# Patient Record
Sex: Male | Born: 1975
Health system: Southern US, Community
[De-identification: ages and names within clinical notes are randomized; demographics above are authoritative.]

## PROBLEM LIST (undated history)

## (undated) DIAGNOSIS — N289 Disorder of kidney and ureter, unspecified: Secondary | ICD-10-CM

## (undated) DIAGNOSIS — I10 Essential (primary) hypertension: Secondary | ICD-10-CM

## (undated) DIAGNOSIS — F319 Bipolar disorder, unspecified: Secondary | ICD-10-CM

## (undated) DIAGNOSIS — N62 Hypertrophy of breast: Secondary | ICD-10-CM

## (undated) DIAGNOSIS — E221 Hyperprolactinemia: Secondary | ICD-10-CM

## (undated) HISTORY — DX: Disorder of kidney and ureter, unspecified: N28.9

## (undated) HISTORY — DX: Bipolar disorder, unspecified: F31.9

## (undated) HISTORY — DX: Essential (primary) hypertension: I10

## (undated) HISTORY — PX: DENTAL SURGERY: SHX609

## (undated) HISTORY — DX: Hypertrophy of breast: N62

## (undated) HISTORY — DX: Hyperprolactinemia: E22.1

---

## 1997-05-26 ENCOUNTER — Other Ambulatory Visit: Admission: RE | Admit: 1997-05-26 | Discharge: 1997-05-26 | Payer: Self-pay

## 1997-05-26 ENCOUNTER — Emergency Department (HOSPITAL_COMMUNITY): Admission: EM | Admit: 1997-05-26 | Discharge: 1997-05-26 | Payer: Self-pay | Admitting: Emergency Medicine

## 1997-12-19 ENCOUNTER — Inpatient Hospital Stay (HOSPITAL_COMMUNITY): Admission: EM | Admit: 1997-12-19 | Discharge: 1997-12-23 | Payer: Self-pay | Admitting: Emergency Medicine

## 1998-05-29 ENCOUNTER — Emergency Department (HOSPITAL_COMMUNITY): Admission: EM | Admit: 1998-05-29 | Discharge: 1998-05-29 | Payer: Self-pay | Admitting: Emergency Medicine

## 1998-05-29 ENCOUNTER — Encounter: Payer: Self-pay | Admitting: Emergency Medicine

## 1998-11-03 ENCOUNTER — Emergency Department (HOSPITAL_COMMUNITY): Admission: EM | Admit: 1998-11-03 | Discharge: 1998-11-03 | Payer: Self-pay | Admitting: Emergency Medicine

## 1998-11-04 ENCOUNTER — Emergency Department (HOSPITAL_COMMUNITY): Admission: EM | Admit: 1998-11-04 | Discharge: 1998-11-04 | Payer: Self-pay | Admitting: Emergency Medicine

## 1998-11-04 ENCOUNTER — Inpatient Hospital Stay (HOSPITAL_COMMUNITY): Admission: EM | Admit: 1998-11-04 | Discharge: 1998-11-06 | Payer: Self-pay | Admitting: Psychiatry

## 2000-02-03 ENCOUNTER — Emergency Department (HOSPITAL_COMMUNITY): Admission: EM | Admit: 2000-02-03 | Discharge: 2000-02-03 | Payer: Self-pay | Admitting: Emergency Medicine

## 2000-08-24 ENCOUNTER — Encounter: Payer: Self-pay | Admitting: Emergency Medicine

## 2000-08-24 ENCOUNTER — Emergency Department (HOSPITAL_COMMUNITY): Admission: EM | Admit: 2000-08-24 | Discharge: 2000-08-24 | Payer: Self-pay

## 2000-11-29 ENCOUNTER — Emergency Department (HOSPITAL_COMMUNITY): Admission: EM | Admit: 2000-11-29 | Discharge: 2000-11-29 | Payer: Self-pay | Admitting: Emergency Medicine

## 2000-11-29 ENCOUNTER — Encounter: Payer: Self-pay | Admitting: Emergency Medicine

## 2001-03-03 ENCOUNTER — Emergency Department (HOSPITAL_COMMUNITY): Admission: EM | Admit: 2001-03-03 | Discharge: 2001-03-03 | Payer: Self-pay | Admitting: Emergency Medicine

## 2001-05-30 ENCOUNTER — Emergency Department (HOSPITAL_COMMUNITY): Admission: EM | Admit: 2001-05-30 | Discharge: 2001-05-30 | Payer: Self-pay

## 2001-08-08 ENCOUNTER — Emergency Department (HOSPITAL_COMMUNITY): Admission: EM | Admit: 2001-08-08 | Discharge: 2001-08-08 | Payer: Self-pay | Admitting: Emergency Medicine

## 2002-01-22 ENCOUNTER — Inpatient Hospital Stay (HOSPITAL_COMMUNITY): Admission: EM | Admit: 2002-01-22 | Discharge: 2002-01-25 | Payer: Self-pay | Admitting: Psychiatry

## 2003-03-03 ENCOUNTER — Emergency Department (HOSPITAL_COMMUNITY): Admission: EM | Admit: 2003-03-03 | Discharge: 2003-03-04 | Payer: Self-pay | Admitting: Emergency Medicine

## 2004-09-10 ENCOUNTER — Ambulatory Visit: Payer: Self-pay | Admitting: Internal Medicine

## 2004-09-17 ENCOUNTER — Encounter: Admission: RE | Admit: 2004-09-17 | Discharge: 2004-09-17 | Payer: Self-pay | Admitting: Internal Medicine

## 2004-09-18 ENCOUNTER — Ambulatory Visit: Payer: Self-pay | Admitting: Internal Medicine

## 2004-09-20 ENCOUNTER — Ambulatory Visit: Payer: Self-pay | Admitting: Internal Medicine

## 2004-09-27 ENCOUNTER — Ambulatory Visit (HOSPITAL_COMMUNITY): Admission: RE | Admit: 2004-09-27 | Discharge: 2004-09-27 | Payer: Self-pay | Admitting: Internal Medicine

## 2004-09-27 ENCOUNTER — Ambulatory Visit: Payer: Self-pay | Admitting: Internal Medicine

## 2004-09-27 DIAGNOSIS — E221 Hyperprolactinemia: Secondary | ICD-10-CM

## 2004-09-27 HISTORY — DX: Hyperprolactinemia: E22.1

## 2005-02-20 ENCOUNTER — Ambulatory Visit: Payer: Self-pay | Admitting: Internal Medicine

## 2005-02-21 DIAGNOSIS — N62 Hypertrophy of breast: Secondary | ICD-10-CM

## 2005-02-21 HISTORY — DX: Hypertrophy of breast: N62

## 2005-02-26 ENCOUNTER — Ambulatory Visit: Payer: Self-pay | Admitting: Internal Medicine

## 2005-03-01 ENCOUNTER — Ambulatory Visit (HOSPITAL_COMMUNITY): Admission: RE | Admit: 2005-03-01 | Discharge: 2005-03-01 | Payer: Self-pay | Admitting: Internal Medicine

## 2005-03-07 ENCOUNTER — Ambulatory Visit: Payer: Self-pay | Admitting: Internal Medicine

## 2005-03-19 ENCOUNTER — Ambulatory Visit: Payer: Self-pay | Admitting: Internal Medicine

## 2005-03-21 ENCOUNTER — Ambulatory Visit: Payer: Self-pay | Admitting: Internal Medicine

## 2005-04-02 ENCOUNTER — Ambulatory Visit: Payer: Self-pay | Admitting: Hospitalist

## 2005-04-04 ENCOUNTER — Ambulatory Visit: Payer: Self-pay | Admitting: Internal Medicine

## 2005-08-01 ENCOUNTER — Ambulatory Visit: Payer: Self-pay | Admitting: Internal Medicine

## 2005-10-10 ENCOUNTER — Ambulatory Visit: Payer: Self-pay | Admitting: Internal Medicine

## 2005-11-06 DIAGNOSIS — F319 Bipolar disorder, unspecified: Secondary | ICD-10-CM | POA: Insufficient documentation

## 2005-11-06 DIAGNOSIS — I10 Essential (primary) hypertension: Secondary | ICD-10-CM | POA: Insufficient documentation

## 2005-11-06 DIAGNOSIS — N62 Hypertrophy of breast: Secondary | ICD-10-CM

## 2006-01-30 DIAGNOSIS — E229 Hyperfunction of pituitary gland, unspecified: Secondary | ICD-10-CM

## 2006-02-25 ENCOUNTER — Telehealth: Payer: Self-pay | Admitting: *Deleted

## 2006-03-24 ENCOUNTER — Telehealth: Payer: Self-pay | Admitting: *Deleted

## 2006-03-25 ENCOUNTER — Encounter (INDEPENDENT_AMBULATORY_CARE_PROVIDER_SITE_OTHER): Payer: Self-pay | Admitting: *Deleted

## 2006-05-27 ENCOUNTER — Telehealth (INDEPENDENT_AMBULATORY_CARE_PROVIDER_SITE_OTHER): Payer: Self-pay | Admitting: *Deleted

## 2006-06-24 ENCOUNTER — Telehealth (INDEPENDENT_AMBULATORY_CARE_PROVIDER_SITE_OTHER): Payer: Self-pay | Admitting: *Deleted

## 2006-07-04 ENCOUNTER — Ambulatory Visit (HOSPITAL_COMMUNITY): Admission: RE | Admit: 2006-07-04 | Discharge: 2006-07-04 | Payer: Self-pay | Admitting: Internal Medicine

## 2006-07-04 ENCOUNTER — Encounter (INDEPENDENT_AMBULATORY_CARE_PROVIDER_SITE_OTHER): Payer: Self-pay | Admitting: Internal Medicine

## 2006-07-04 ENCOUNTER — Ambulatory Visit: Payer: Self-pay | Admitting: Internal Medicine

## 2006-07-07 LAB — CONVERTED CEMR LAB
ALT: 30 units/L (ref 0–53)
AST: 42 units/L — ABNORMAL HIGH (ref 0–37)
Alkaline Phosphatase: 70 units/L (ref 39–117)
Basophils Absolute: 0.1 10*3/uL (ref 0.0–0.1)
Basophils Relative: 1 % (ref 0–1)
Bilirubin Urine: NEGATIVE
Chloride: 104 meq/L (ref 96–112)
Creatinine, Ser: 2.09 mg/dL — ABNORMAL HIGH (ref 0.40–1.50)
Creatinine, Urine: 94.9 mg/dL
Eosinophils Absolute: 0.4 10*3/uL (ref 0.0–0.7)
Eosinophils Relative: 5 % (ref 0–5)
Hemoglobin: 16 g/dL (ref 13.0–17.0)
Leukocytes, UA: NEGATIVE
MCHC: 33.8 g/dL (ref 30.0–36.0)
MCV: 89.4 fL (ref 78.0–100.0)
Microalb, Ur: 2.33 mg/dL — ABNORMAL HIGH (ref 0.00–1.89)
Monocytes Absolute: 0.9 10*3/uL — ABNORMAL HIGH (ref 0.2–0.7)
Neutro Abs: 4.4 10*3/uL (ref 1.7–7.7)
RDW: 13.2 % (ref 11.5–14.0)
Specific Gravity, Urine: 1.01 (ref 1.005–1.03)
Total Bilirubin: 0.5 mg/dL (ref 0.3–1.2)
Urine Glucose: NEGATIVE mg/dL
pH: 6.5 (ref 5.0–8.0)

## 2007-01-12 ENCOUNTER — Telehealth (INDEPENDENT_AMBULATORY_CARE_PROVIDER_SITE_OTHER): Payer: Self-pay | Admitting: *Deleted

## 2007-01-14 ENCOUNTER — Telehealth (INDEPENDENT_AMBULATORY_CARE_PROVIDER_SITE_OTHER): Payer: Self-pay | Admitting: *Deleted

## 2007-06-21 ENCOUNTER — Other Ambulatory Visit: Payer: Self-pay

## 2007-06-21 ENCOUNTER — Other Ambulatory Visit: Payer: Self-pay | Admitting: Emergency Medicine

## 2007-06-22 ENCOUNTER — Inpatient Hospital Stay (HOSPITAL_COMMUNITY): Admission: AD | Admit: 2007-06-22 | Discharge: 2007-06-23 | Payer: Self-pay | Admitting: *Deleted

## 2007-06-22 ENCOUNTER — Ambulatory Visit: Payer: Self-pay | Admitting: *Deleted

## 2008-01-26 ENCOUNTER — Telehealth (INDEPENDENT_AMBULATORY_CARE_PROVIDER_SITE_OTHER): Payer: Self-pay | Admitting: *Deleted

## 2008-01-27 ENCOUNTER — Encounter (INDEPENDENT_AMBULATORY_CARE_PROVIDER_SITE_OTHER): Payer: Self-pay | Admitting: Internal Medicine

## 2008-01-27 ENCOUNTER — Ambulatory Visit: Payer: Self-pay | Admitting: Internal Medicine

## 2008-01-27 LAB — CONVERTED CEMR LAB
CO2: 24 meq/L (ref 19–32)
Calcium: 9.7 mg/dL (ref 8.4–10.5)
Chloride: 105 meq/L (ref 96–112)
Creatinine, Ser: 1.79 mg/dL — ABNORMAL HIGH (ref 0.40–1.50)
Glucose, Bld: 95 mg/dL (ref 70–99)
HCT: 45.8 % (ref 39.0–52.0)
MCV: 86.9 fL (ref 78.0–100.0)
RBC: 5.27 M/uL (ref 4.22–5.81)
WBC: 10.4 10*3/uL (ref 4.0–10.5)

## 2008-09-11 ENCOUNTER — Emergency Department (HOSPITAL_COMMUNITY): Admission: EM | Admit: 2008-09-11 | Discharge: 2008-09-11 | Payer: Self-pay | Admitting: Emergency Medicine

## 2009-01-17 ENCOUNTER — Ambulatory Visit: Payer: Self-pay | Admitting: Internal Medicine

## 2009-01-17 ENCOUNTER — Telehealth (INDEPENDENT_AMBULATORY_CARE_PROVIDER_SITE_OTHER): Payer: Self-pay | Admitting: *Deleted

## 2009-01-17 LAB — CONVERTED CEMR LAB
BUN: 8 mg/dL
CO2: 28 meq/L
Calcium: 9.5 mg/dL
Chloride: 102 meq/L
Creatinine, Ser: 1.79 mg/dL — ABNORMAL HIGH
Glucose, Bld: 126 mg/dL — ABNORMAL HIGH
Potassium: 3.3 meq/L — ABNORMAL LOW
Sodium: 142 meq/L

## 2009-01-18 ENCOUNTER — Telehealth: Payer: Self-pay | Admitting: Internal Medicine

## 2009-01-23 ENCOUNTER — Encounter: Payer: Self-pay | Admitting: Internal Medicine

## 2009-01-23 ENCOUNTER — Ambulatory Visit: Payer: Self-pay | Admitting: Internal Medicine

## 2009-01-24 LAB — CONVERTED CEMR LAB
BUN: 8 mg/dL (ref 6–23)
Chloride: 105 meq/L (ref 96–112)
Creatinine, Ser: 1.77 mg/dL — ABNORMAL HIGH (ref 0.40–1.50)
Potassium: 3.7 meq/L (ref 3.5–5.3)

## 2009-02-14 ENCOUNTER — Ambulatory Visit: Payer: Self-pay | Admitting: Internal Medicine

## 2009-02-14 DIAGNOSIS — R17 Unspecified jaundice: Secondary | ICD-10-CM | POA: Insufficient documentation

## 2009-02-14 DIAGNOSIS — R7309 Other abnormal glucose: Secondary | ICD-10-CM

## 2009-02-14 LAB — CONVERTED CEMR LAB: Hgb A1c MFr Bld: 7.2 %

## 2009-02-15 ENCOUNTER — Telehealth: Payer: Self-pay | Admitting: Internal Medicine

## 2009-02-15 ENCOUNTER — Encounter: Payer: Self-pay | Admitting: Internal Medicine

## 2009-02-15 LAB — CONVERTED CEMR LAB
ALT: 32 units/L (ref 0–53)
Albumin: 4.4 g/dL (ref 3.5–5.2)
CO2: 29 meq/L (ref 19–32)
Calcium: 9.2 mg/dL (ref 8.4–10.5)
Chloride: 103 meq/L (ref 96–112)
Glucose, Bld: 97 mg/dL (ref 70–99)
MCV: 89.1 fL (ref >=78.0)
Platelets: 249 10*3/uL (ref 150–400)
RBC: 5.16 M/uL (ref 4.22–5.81)
Sodium: 143 meq/L (ref 135–145)
Total Bilirubin: 0.5 mg/dL (ref 0.3–1.2)
Total Protein: 7.9 g/dL (ref 6.0–8.3)
Valproic Acid Lvl: 53.7 ug/mL (ref >=50.0)
WBC: 10 10*3/uL (ref 4.0–10.5)

## 2009-03-15 ENCOUNTER — Ambulatory Visit: Payer: Self-pay | Admitting: Internal Medicine

## 2009-05-30 ENCOUNTER — Encounter: Payer: Self-pay | Admitting: Internal Medicine

## 2009-06-02 ENCOUNTER — Telehealth: Payer: Self-pay | Admitting: Internal Medicine

## 2009-06-05 ENCOUNTER — Encounter: Payer: Self-pay | Admitting: Internal Medicine

## 2009-06-05 ENCOUNTER — Ambulatory Visit: Payer: Self-pay | Admitting: Internal Medicine

## 2009-06-13 LAB — CONVERTED CEMR LAB
ALT: 29 units/L (ref 0–53)
AST: 48 units/L — ABNORMAL HIGH (ref 0–37)
Alkaline Phosphatase: 65 units/L (ref 39–117)
Basophils Relative: 0 % (ref 0–1)
CO2: 26 meq/L (ref 19–32)
Creatinine, Ser: 1.82 mg/dL — ABNORMAL HIGH (ref 0.40–1.50)
Eosinophils Absolute: 0.2 10*3/uL (ref 0.0–0.7)
Eosinophils Relative: 2 % (ref 0–5)
HCT: 44.7 % (ref 39.0–52.0)
HDL: 33 mg/dL — ABNORMAL LOW (ref >39)
LDL Cholesterol: 132 mg/dL — ABNORMAL HIGH (ref 0–99)
Lymphs Abs: 2.8 10*3/uL (ref 0.7–4.0)
MCHC: 32.9 g/dL (ref 30.0–36.0)
MCV: 89.8 fL (ref >=78.0)
Neutrophils Relative %: 58 % (ref 43–77)
Platelets: 229 10*3/uL (ref 150–400)
Sodium: 142 meq/L (ref 135–145)
Total Bilirubin: 0.6 mg/dL (ref 0.3–1.2)
Total CHOL/HDL Ratio: 6.6
Total Protein: 8 g/dL (ref 6.0–8.3)
Valproic Acid Lvl: 72.3 ug/mL (ref >=50.0)
WBC: 8.4 10*3/uL (ref 4.0–10.5)

## 2009-08-11 ENCOUNTER — Telehealth: Payer: Self-pay | Admitting: Internal Medicine

## 2010-01-22 ENCOUNTER — Telehealth: Payer: Self-pay | Admitting: Internal Medicine

## 2010-02-11 ENCOUNTER — Encounter: Payer: Self-pay | Admitting: Pulmonary Disease

## 2010-02-20 NOTE — Progress Notes (Signed)
Summary: pharmacy/gg  Phone Note From Pharmacy   Caller: [facility] Summary of Call: Received call from [facility] asking for clarification of Rx's written yesterday this pt.  THey have 1 rx for norvasc 5mg  and one for nrovasc 10 mg.  they are asking if pt should recieve both. I reviewed notes from yesterday and see that norvasc was increased to 10 mg.  pharmacy advised only fill 1 rx for norvasc 10 mg. Initial call taken by: Merrie Roof RN,  February 15, 2009 9:41 AM  Follow-up for Phone Call        Noted. Dr. Gilford Rile will make the change in the EMR. Follow-up by: Julaine Fusi  DO,  February 15, 2009 11:26 AM

## 2010-02-20 NOTE — Progress Notes (Signed)
Summary: phone/gg  Phone Note From Other Clinic   Summary of Call: [facility] is requesting the following labs results.  Pt coming in Monday for fasting lab work.  Can you enter if you approve. FBS, Lipids, Valproic Acid Level, LFT, CBC with Diff Initial call taken by: Merrie Roof RN,  Jun 02, 2009 10:03 AM  Follow-up for Phone Call        Okay to order labs per Dr Gilford Rile Follow-up by: Merrie Roof RN,  Jun 02, 2009 2:48 PM  New Problems: ENCOUNTER FOR LONG-TERM USE OF OTHER MEDICATIONS (ICD-V58.69)   New Problems: ENCOUNTER FOR LONG-TERM USE OF OTHER MEDICATIONS (ICD-V58.69)  Process Orders Check Orders Results:     [facility]: ABN not required for this insurance Order queued for requisitioning for [facility]: Jun 02, 2009 3:01 PM  Tests Sent for requisitioning (Jun 02, 2009 3:01 PM):     06/05/2009: [facility] -- T-Comprehensive Metabolic Panel [[id number]] (signed)     06/02/2009: [facility] -- T-CBC w/Diff [[id number]] (signed)     06/02/2009: [facility] -- T-Lipid Profile [id number] (signed)     06/02/2009: [facility] -- T-Valproic Acid (Depakene) [[id number]] (signed)

## 2010-02-20 NOTE — Assessment & Plan Note (Signed)
Summary: ACUTE-HIGH BLOOD PRESSURE/164/108 PER DENTIST(TOBBIA)   Vital Signs:  Patient profile:   35 year old male Height:      71 inches (180.34 cm) Weight:      231.5 pounds (105.23 kg) BMI:     32.40 Temp:     98.9 degrees F (37.17 degrees C) oral Pulse rate:   92 / minute BP sitting:   130 / 90  (left arm) Cuff size:   large  Vitals Entered By: Krystal Eaton Duncan Dull) (March 15, 2009 10:57 AM) CC: f/u htn Is Patient Diabetic? No Pain Assessment Patient in pain? no      Nutritional Status BMI of > 30 = obese  Have you ever been in a relationship where you felt threatened, hurt or afraid?No   Does patient need assistance? Functional Status Self care Ambulation Normal   Primary Care Provider:  [name]  CC:  f/u htn.  History of Present Illness: 35 y/o man with Bipolar disorder and HTN comes to the clinic for a clearance for dental extraction after his BP was high at the dentists office. He does not have any compaints. Last seen a month back when his norvasc dose was increased  Preventive Screening-Counseling & Management  Alcohol-Tobacco     Smoking Status: quit     Year Quit: 2000 or earlier  Current Medications (verified): 1)  Amlodipine Besylate 10 Mg Tabs (Amlodipine Besylate) .... Take 1 Tablet By Mouth Once A Day 2)  Lasix 20 Mg Tabs (Furosemide) .... Take 1 Tablet By Mouth Once Daily. 3)  Lisinopril 10 Mg Tabs (Lisinopril) .... Take 1 Tablet By Mouth Once Daily. 4)  Valproic Acid 250 Mg Caps (Valproic Acid) .... Take 1 Capsule By Mouth Two Times A Day. 5)  Benztropine Mesylate 1 Mg Tabs (Benztropine Mesylate) .... Take 1 Tablet By Mouth Once Daily. 6)  Geodon 40 Mg Caps (Ziprasidone Hcl) .... Take 1 Tablet By Mouth Two Times A Day  Allergies (verified): No Known Drug Allergies  Review of Systems  The patient denies anorexia, fever, weight loss, weight gain, vision loss, decreased hearing, hoarseness, chest pain, syncope, dyspnea on  exertion, peripheral edema, prolonged cough, headaches, hemoptysis, abdominal pain, melena, hematochezia, severe indigestion/heartburn, hematuria, incontinence, genital sores, muscle weakness, suspicious skin lesions, transient blindness, difficulty walking, depression, unusual weight change, abnormal bleeding, enlarged lymph nodes, angioedema, breast masses, and testicular masses.    Physical Exam  General:  alert and overweight-appearing.   Head:  normocephalic and atraumatic.   Lungs:  normal respiratory effort, no accessory muscle use, normal breath sounds, no crackles, and no wheezes.  Heart:  normal rate, regular rhythm, systolic ejection murmur.    Impression & Recommendations:  Problem # 1:  HYPERTENSION (ICD-401.9) 120/90 when i measured it. Continue with current plan. Clear for dental procedure.  His updated medication list for this problem includes:    Amlodipine Besylate 10 Mg Tabs (Amlodipine besylate) .[name]... Take 1 tablet by mouth once a day    Lasix 20 Mg Tabs (Furosemide) .[name]... Take 1 tablet by mouth once daily.    Lisinopril 10 Mg Tabs (Lisinopril) .[name]... Take 1 tablet by mouth once daily.  BP today: 130/90 Prior BP: 155/110 (02/14/2009)  Labs Reviewed: K+: 3.7 (02/15/2009) Creat: : 1.60 (02/15/2009)     Problem # 2:  JAUNDICE (ICD-782.4) CMET was checked at last visit as there was a concern fo jaundice. LFT depect mildly elevated ALT. May be from valproate (1-5%). Otherwise, bilirubin and other values normal. Asymptomatic.  Problem # 3:  HYPERGLYCEMIA (ICD-790.29) HbA1C 7.2. Will need a repeat check to make diagnosis. No complaints of polyurea, polydipisa etc      Problem # 4:  RENAL INSUFFICIENCY (ICD-588.9) Stable at baseline Labs Reviewed: Creat: 1.60 (02/15/2009)  Complete Medication List: 1)  Amlodipine Besylate 10 Mg Tabs (Amlodipine besylate) .... Take 1 tablet by mouth once a day 2)  Lasix 20 Mg Tabs (Furosemide) .... Take 1 tablet by mouth once  daily. 3)  Lisinopril 10 Mg Tabs (Lisinopril) .... Take 1 tablet by mouth once daily. 4)  Valproic Acid 250 Mg Caps (Valproic acid) .... Take 1 capsule by mouth two times a day. 5)  Benztropine Mesylate 1 Mg Tabs (Benztropine mesylate) .... Take 1 tablet by mouth once daily. 6)  Geodon 40 Mg Caps (Ziprasidone hcl) .... Take 1 tablet by mouth two times a day  Patient Instructions: 1)  Please schedule a follow-up appointment in 3 months. 2)  It is important that you exercise regularly at least 20 minutes 5 times a week. If you develop chest pain, have severe difficulty breathing, or feel very tired , stop exercising immediately and seek medical attention. 3)  You need to lose weight. Consider a lower calorie diet and regular exercise.    Prevention & Chronic Care Immunizations   Influenza vaccine: Not documented   Influenza vaccine deferral: Refused  (02/14/2009)    Tetanus booster: Not documented   Td booster deferral: Refused  (02/14/2009)    Pneumococcal vaccine: Not documented  Other Screening   Smoking status: quit  (03/15/2009)  Hypertension   Last Blood Pressure: 130 / 90  (03/15/2009)   Serum creatinine: 1.60  (02/15/2009)   Serum potassium 3.7  (02/15/2009)  Self-Management Support :   Personal Goals (by the next clinic visit) :      Personal blood pressure goal: 130/80  (02/14/2009)   Patient will work on the following items until the next clinic visit to reach self-care goals:     Medications and monitoring: take my medicines every day  (03/15/2009)     Eating: eat more vegetables, eat foods that are low in salt, eat baked foods instead of fried foods  (03/15/2009)     Activity: take a 30 minute walk every day  (03/15/2009)     Other: has not been watching diet or walking but states will start cutting back on salt and fried fast foods and start walking again  (02/14/2009)    Hypertension self-management support: Written self-care plan  (03/15/2009)   Hypertension  self-care plan printed.

## 2010-02-20 NOTE — Letter (Signed)
Summary: LAB /FBS/LIPIDS/ VALPROIC ACID LEVEL/LFT'S/CBC DIFF  LAB /FBS/LIPIDS/ VALPROIC ACID LEVEL/LFT'S/CBC DIFF   Imported By: Margie Billet 06/05/2009 11:07:52  _____________________________________________________________________  External Attachment:    Type:   Image     Comment:   External Document

## 2010-02-20 NOTE — Assessment & Plan Note (Signed)
Summary: RA/NEEDS ROUTINE CHECKUP AND REFILL ON MEDS/CH   Vital Signs:  Patient profile:   35 year old male Height:      71 inches (180.34 cm) Weight:      238.4 pounds (108.36 kg) BMI:     33.37 Temp:     98.8 degrees F (37.11 degrees C) Pulse rate:   102 / minute BP sitting:   155 / 110  (right arm)  Vitals Entered By: Dorie Rank RN (February 14, 2009 2:53 PM) CC: needs med refills, - still going to [facility], Is Patient Diabetic? No Pain Assessment Patient in pain? no      Nutritional Status BMI of > 30 = obese  Does patient need assistance? Functional Status Self care, Cook/clean, Shopping, Social activities Ambulation Normal Comments mom "takes care of me" - pt able to feed and dress self   Serial Vital Signs/Assessments:  Time      Position  BP       Pulse  Resp  Temp     By 2:55 PM   L Arm     157/109                        Dorie Rank RN   Primary Care Provider:  Hollace Hayward MD  CC:  needs med refills, - still going to [facility], and .  History of Present Illness: 35 y/o with PMH HTN and bipolar dsorder followed at mental health by [name] comes to [facility] for FU and medication refills.   no new complaints, denies CP, SOB, N,V,C,D, fever, chills or any other complaints.    Depression History:      The patient denies a depressed mood most of the day and a diminished interest in his usual daily activities.        Comments:  meds from [facility] helping.   Preventive Screening-Counseling & Management  Alcohol-Tobacco     Smoking Status: quit     Year Started:       Year Quit: 2000 or earlier  Current Medications (verified): 1)  Amlodipine Besylate 10 Mg Tabs (Amlodipine Besylate) .... Take 1 Tablet By Mouth Once A Day 2)  Lasix 20 Mg Tabs (Furosemide) .... Take 1 Tablet By Mouth Once Daily. 3)  Lisinopril 10 Mg Tabs (Lisinopril) .... Take 1 Tablet By Mouth Once Daily. 4)  Valproic Acid 250 Mg Caps (Valproic Acid)  .... Take 1 Capsule By Mouth Two Times A Day. 5)  Benztropine Mesylate 1 Mg Tabs (Benztropine Mesylate) .... Take 1 Tablet By Mouth Once Daily. 6)  Geodon 40 Mg Caps (Ziprasidone Hcl) .... Take 1 Tablet By Mouth Two Times A Day  Allergies (verified): No Known Drug Allergies  Review of Systems       Per HPI  Physical Exam  General:  alert, well-developed, and cooperative to examination.    Lungs:  normal respiratory effort, no accessory muscle use, normal breath sounds, no crackles, and no wheezes.  Heart:  normal rate, regular rhythm, 3/6 systolic ejection murmur.  Abdomen:  soft, non-tender, normal bowel sounds, no distention, no guarding, no rebound tenderness, no hepatomegaly, and no splenomegaly.    Msk:  no joint swelling, no joint warmth, and no redness over joints.      Impression & Recommendations:  Problem # 1:  HYPERTENSION (ICD-401.9) Increased Amlodipine to 10mg , and check CMET, will reassess in 1 month.  If bp still not controlled, many things can  be done including  a change of lasix to hctz (works well with ace in AA population) vs increase ace.   His updated medication list for this problem includes:    Amlodipine Besylate 10 Mg Tabs (Amlodipine besylate) .[name]... Take 1 tablet by mouth once a day    Lasix 20 Mg Tabs (Furosemide) .[name]... Take 1 tablet by mouth once daily.    Lisinopril 10 Mg Tabs (Lisinopril) .[name]... Take 1 tablet by mouth once daily.  Problem # 2:  RENAL INSUFFICIENCY (ICD-588.9) Basleline Cr 1.7,  will check CMET for any changes and contine to monitor. 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  Orders: T-Comprehensive Metabolic Panel [id number]) T-CBC No Diff ([id number])  Problem # 3:  HYPERGLYCEMIA (ICD-790.29) Never been check by A1c despite having elevated CBG's will check A1C today, if positive for DM, may need to start an antihyperglycemic agent such as metformin.    Orders: T-Hgb A1C (in-house) ([id number])  Problem # 4:  JAUNDICE (ICD-782.4) eyes appear yellow, this of new onset, will check LFT for any sign of liver damage from his medications.   Problem # 5:  DISORDER, BIPOLAR NOS (ICD-296.80) managed by mental health will check valproic acid level today.   Orders: T-Valproic Acid (Depakene) ([id number])  Complete Medication List: 1)  Amlodipine Besylate 10 Mg Tabs (Amlodipine besylate) .... Take 1 tablet by mouth once a day 2)  Lasix 20 Mg Tabs (Furosemide) .... Take 1 tablet by mouth once daily. 3)  Lisinopril 10 Mg Tabs (Lisinopril) .... Take 1 tablet by mouth once daily. 4)  Valproic Acid 250 Mg Caps (Valproic acid) .... Take 1 capsule by mouth two times a day. 5)  Benztropine Mesylate 1 Mg Tabs (Benztropine mesylate) .... Take 1 tablet by mouth once daily. 6)  Geodon 40 Mg Caps (Ziprasidone hcl) .... Take 1 tablet by mouth two times a day  Patient Instructions: 1)  Please schedule a follow-up appointment in 1 month. Prescriptions: AMLODIPINE BESYLATE 10 MG TABS (AMLODIPINE BESYLATE) Take 1 tablet by mouth once a day  #30 x 5   Entered and Authorized by:   [name]   Signed by:   [name] on 02/14/2009   Method used:   Print then Give to Patient   RxID:   [id number] NORVASC 5 MG TABS (AMLODIPINE BESYLATE) Take 1 tablet by mouth once a day.  #30 x 11   Entered and Authorized by:   [name]   Signed by:   [name] on 02/14/2009   Method used:   Print then Give to Patient   RxID:   [id number] LISINOPRIL 10 MG TABS (LISINOPRIL) Take 1 tablet by mouth once daily.  #30 x 11   Entered and Authorized by:   [name]   Signed by:   [name] on 02/14/2009   Method used:   Print then Give to Patient   RxID:   [id number] LASIX 20 MG TABS (FUROSEMIDE) Take 1 tablet by mouth once daily.  #30 x 11   Entered and Authorized by:   [name]   Signed by:   Darnelle Maffucci  MD on 02/14/2009   Method used:   Print then Give to Patient   RxID:   [id number]  Process Orders Check Orders Results:     [facility]: ABN not required for this insurance Tests Sent for requisitioning (February 14, 2009 5:13 PM):     02/14/2009: [facility] -- T-Comprehensive Metabolic Panel [[id number]] (signed)     02/14/2009: [facility] -- T-CBC No Diff [[id number]] (signed)     02/14/2009: [facility] -- T-Valproic Acid (Depakene) [[id number]] (signed)    Prevention & Chronic Care Immunizations   Influenza vaccine: Not documented   Influenza vaccine deferral: Refused  (02/14/2009)    Tetanus booster: Not documented   Td booster deferral: Refused  (02/14/2009)    Pneumococcal vaccine: Not documented  Other Screening   Smoking status: quit  (02/14/2009)  Hypertension   Last Blood Pressure: 155 / 110  (02/14/2009)   Serum creatinine: 1.77  (01/23/2009)   Serum potassium 3.7  (01/23/2009) CMP ordered     Hypertension flowsheet reviewed?: Yes   Progress toward BP goal: Deteriorated  Self-Management Support :   Personal Goals (by the next clinic visit) :      Personal blood pressure goal: 130/80  (02/14/2009)   Patient will work on the following items until the next clinic visit to reach self-care goals:     Medications and monitoring: take my medicines every day, check my blood pressure, bring all of my medications to every visit  (02/14/2009)     Eating: drink diet soda or water instead of juice or soda, eat foods that are low in salt, eat baked foods instead of fried foods  (02/14/2009)     Activity: join a walking program  (02/14/2009)     Other: has not been watching diet or walking but states will start cutting back on salt and fried fast foods and start walking again  (02/14/2009)    Hypertension self-management support: Referred for medical nutrition therapy  (02/14/2009)   Nursing  Instructions: Refer for medical nutrition therapy (see order)    Diabetes Self Management Training Referral Patient Name: Kyle Montgomery Date Of Birth: February 23, 1975 MRN: [id number] Current Diagnosis:  JAUNDICE (ICD-782.4) HYPERGLYCEMIA (ICD-790.29) HYPERPROLACTINEMIA (ICD-253.1) GYNECOMASTIA (ICD-611.1) DISORDER, BIPOLAR NOS (ICD-296.80) RENAL INSUFFICIENCY (ICD-588.9) HYPERTENSION (ICD-401.9)      Laboratory Results   Blood Tests   Date/Time Received: February 14, 2009 4:42 PM  Date/Time Reported: Burke Keels  February 14, 2009 4:42 PM   HGBA1C: 7.2%   (Normal Range: Non-Diabetic - 3-6%   Control Diabetic - 6-8%)

## 2010-02-20 NOTE — Progress Notes (Signed)
Summary: Refill/gh  Phone Note Refill Request Message from:  Fax from Pharmacy on August 11, 2009 11:08 AM  Refills Requested: Medication #1:  AMLODIPINE BESYLATE 10 MG TABS Take 1 tablet by mouth once a day   Last Refilled: 07/12/2009  Method Requested: Electronic Initial call taken by: Angelina Ok RN,  August 11, 2009 11:08 AM    Prescriptions: AMLODIPINE BESYLATE 10 MG TABS (AMLODIPINE BESYLATE) Take 1 tablet by mouth once a day  #30 x 5   Entered and Authorized by:   [name]   Signed by:   [name] on 08/14/2009   Method used:   Faxed to ...       [facility] (retail)       [address]       [place], [place]  [address]       Ph: [phone]       Fax: [phone]   RxID:   [id number]

## 2010-02-22 NOTE — Progress Notes (Signed)
Summary: Refill/gh  Phone Note Refill Request Message from:  Fax from Pharmacy on January 22, 2010 2:36 PM  Refills Requested: Medication #1:  AMLODIPINE BESYLATE 10 MG TABS Take 1 tablet by mouth once a day   Last Refilled: 12/30/2009  Method Requested: Electronic Initial call taken by: Angelina Ok RN,  January 22, 2010 2:37 PM  Follow-up for Phone Call        Rx faxed to pharmacy Follow-up by: Angelina Ok RN,  January 23, 2010 1:42 PM    Prescriptions: AMLODIPINE BESYLATE 10 MG TABS (AMLODIPINE BESYLATE) Take 1 tablet by mouth once a day  #30 x 5   Entered and Authorized by:   [name]   Signed by:   [name] on 01/22/2010   Method used:   Faxed to ...       [facility] (retail)       [address]       [place], [place]  [address]       Ph: [phone]       Fax: [phone]   RxID:   [id number]

## 2010-03-05 ENCOUNTER — Other Ambulatory Visit: Payer: Self-pay | Admitting: *Deleted

## 2010-03-05 MED ORDER — AMLODIPINE BESYLATE 10 MG PO TABS: 10.0000 mg | ORAL_TABLET | Freq: Every day | ORAL | Status: DC

## 2010-03-05 MED ORDER — FUROSEMIDE 20 MG PO TABS: 20.0000 mg | ORAL_TABLET | Freq: Every day | ORAL | Status: DC

## 2010-03-05 MED ORDER — LISINOPRIL 10 MG PO TABS: 10.0000 mg | ORAL_TABLET | Freq: Every day | ORAL | Status: DC

## 2010-04-16 ENCOUNTER — Encounter: Payer: Self-pay | Admitting: Internal Medicine

## 2010-06-05 NOTE — Discharge Summary (Signed)
NAME:  Kyle Montgomery, Kyle Montgomery NO.:  [id number]   MEDICAL RECORD NO.:  [id number]          PATIENT TYPE:  IPS   LOCATION:  0303                          FACILITY:  [facility]   PHYSICIAN:  Jasmine Pang, M.D. DATE OF BIRTH:  [birth year]   DATE OF ADMISSION:  06/22/2007  DATE OF DISCHARGE:  06/23/2007                               DISCHARGE SUMMARY   IDENTIFICATION:  This is a 35 year old single African American male from  [place], [place].   HISTORY OF PRESENT ILLNESS:  The patient states he was sent to the ED  because of bizarre behaviors.  He stayed in the ED for 4 days because  there was lack of beds here.  He goes to [facility] for psychiatric  treatment.  He is on Geodon, Depakote, and Cogentin, but he had stopped  taking these recently.  He was having auditory hallucinations.  He  states since he has been in the hospital in the emergency room, he has  stabilized since he has been back on his medications.   PAST PSYCHIATRIC HISTORY:  The patient was here in 2004, he was  depressed and suicidal.  He was seen at [facility].  He has a history of bipolar disorder.   FAMILY HISTORY:  Maternal grandfather has psychiatric problems.   ALCOHOL AND DRUG HISTORY:  No current alcohol use or drug use x5 years.  He did use drugs and alcohol before then.   MEDICAL PROBLEMS:  Hypertension and bronchitis.   MEDICATIONS:  1. Geodon 80 mg p.o. b.i.d. with food.  2. Lisinopril 10 mg p.o. daily.  3. Valproic acid 250 mg p.o. q.h.s.  4. Cogentin 1 mg p.o. q.8 h. p.r.n. EPS.   DRUG ALLERGIES:  No known drug allergies.   PHYSICAL FINDINGS:  There were no acute physical or medical problems  noted in the ED.  There were no abnormal diagnostic studies noted.   HOSPITAL COURSE:  The patient was started on Ambien 10 mg p.o. q.h.s.  p.r.n.  He was also restarted on Geodon 80 mg p.o. b.i.d. with food,  lisinopril 10 mg daily, valproic acid 250 mg  p.o. q.h.s. and Cogentin 1  mg p.o. q.8 h. p.r.n. EPS.  The patient's mental status was very stable.  He wanted to go home.  Again, he reiterated that by staying in the  emergency room 4 days on his psychiatric medications again that he was  not hallucinating.  He was not suicidal.  His mood was less depressed,  less anxious.  Affect consistent with mood.  There was no suicidal or  homicidal ideation.  No thoughts of self-injurious behavior.  No  auditory or visual hallucinations.  No paranoia or delusions.  Thoughts  were logical and goal-directed.  Thought content, no predominant theme.  Cognitive was grossly back to baseline.   DISCHARGE DIAGNOSES:  Axis I:  Mood disorder, not otherwise specified.  Axis II:  None.  Axis III:  Hypertension, bronchitis.  Axis IV:  Moderate (problems with primary psychosocial group, other  psychosocial problems, burden of psychiatric illness).  Axis V:  Global assessment of functioning is 50 today.  GAF was 45 upon  admission.  GAF highest past year was 60.   DISCHARGE PLAN:  There was no specific activity level or dietary  restrictions.   POSTHOSPITAL CARE PLANS:  The patient will see Dr. Joni Reining at the  [facility] for followup psychiatric management on an intake basis  and [facility] for community support.  He will call to schedule  this.   Valproic acid level was 36.4 on June 22, 2007.  He will follow up at the  [facility] for review of medical problems and  medications and make an appointment within 1 week to 2 weeks.   DISCHARGE MEDICATIONS:  1. Geodon 80 mg twice a day with food.  2. Valproic acid 500 mg at bedtime.  3. Lisinopril 10 mg daily.      Jasmine Pang, M.D.  Electronically Signed     [facility]/MEDQ  D:  06/23/2007  T:  06/23/2007  Job:  [id number]

## 2010-06-08 NOTE — H&P (Signed)
NAME:  Kyle Montgomery, Kyle Montgomery NO.:  [id number]   MEDICAL RECORD NO.:  [id number]                   PATIENT TYPE:  IPS   LOCATION:  0401                                 FACILITY:  [facility]   PHYSICIAN:  Geoffery Lyons, M.D.                   DATE OF BIRTH:  1975/10/20   DATE OF ADMISSION:  01/22/2002  DATE OF DISCHARGE:  01/25/2002                         PSYCHIATRIC ADMISSION ASSESSMENT   IDENTIFYING INFORMATION:  From the patient and records.  This was a  voluntary admission.   REASON FOR ADMISSION AND SYMPTOMS:  The patient apparently presented to the  emergency department at [facility] indicating that he had skipped  a few appointments at mental health at the [facility] and indicating  that he wanted to live in a long-term facility.  He acknowledged recently  being sad, teary, and not sleeping well.  He says however that he is  compliant with medications, Check my blood, you'll see.   PAST PSYCHIATRIC HISTORY:  He has had prior hospitalized at [facility] and  [facility].   SOCIAL HISTORY:  He went through the 6th grade, he gets disability and  currently lives with his mother.   FAMILY HISTORY:  He believes his maternal grandfather had mental illness,  otherwise it is negative.   ALCOHOL AND DRUG ABUSE:  He denies alcohol or drug use.   PAST MEDICAL HISTORY:  Primary care [facility]:  He states he is seen at the  outpatient clinic at [facility].  Medical problems are none.   CURRENT MEDICATIONS:  Lithium carbonate 300 mg 1 in the morning, 3 at h.s.,  Prolixin a total of 7.5 mg at h.s. and Cogentin 1 mg at h.s.   ALLERGIES:  No known drug allergies.   POSITIVE PHYSICAL FINDINGS:  GENERAL:  A well-nourished, well-developed man  who appeared his stated age.  The only remarkable finding was an unusually  round and distended but firm abdomen with decreased bowel sounds.  The  patient was not clear as to how recently he had  had a bowel movement and did  acknowledge constipation as a problem.   MENTAL STATUS EXAM:  He was anxious.  His affect was restricted.  He was  cooperative and alert and oriented x3.  His sensorium was clear.  His eye  contact was poor.  His speech was normal.  His associations were tight.  His  psychomotor was within normal limits.  His thoughts revealed some paranoia  as well as persecutory thoughts.  He however denied auditory or visual  hallucinations.  He said he has had these in the past, not recently.  He was  not observed to be responding to internal stimuli.  He was not obsessive.  He was neatly dressed and groomed.  He was memory impaired.  Judgment and  insight were fair.  His concentration was intact.  His  intelligence was  below average.   ADMISSION DIAGNOSES:   AXIS I:  Schizophrenia.   AXIS II:  Developmentally disabled.   AXIS III:  None.   AXIS IV:  Housing problem and problem with primary support group.   AXIS V:  Global assessment of function is 30.   PLAN:  To check his lithium level, to reestablish medication compliance, to  discuss his living situation in a family meeting.   ESTIMATED LENGTH OF STAY:  2-3 days.     Mickie Adams- Deery, P.A.-C.              Geoffery Lyons, M.D.    MA/MEDQ  D:  01/31/2002  T:  01/31/2002  Job:  [id number]

## 2010-06-08 NOTE — Discharge Summary (Signed)
NAME:  Kyle Montgomery, Kyle Montgomery NO.:  [id number]   MEDICAL RECORD NO.:  [id number]                   PATIENT TYPE:  IPS   LOCATION:  0401                                 FACILITY:  [facility]   PHYSICIAN:  Geoffery Lyons, M.D.                   DATE OF BIRTH:  1975/05/22   DATE OF ADMISSION:  01/22/2002  DATE OF DISCHARGE:  01/25/2002                                 DISCHARGE SUMMARY   CHIEF COMPLAINT AND PRESENT ILLNESS:  This was the first admission to [facility] for this 35 year old male who presented to the  emergency department at [facility] indicating he had skipped a few  appointments [facility].  He wanted to live in a long-  term facility.  He admitted to being sad, teary eyed, not sleeping well,  although he admitted he was compliant with medications.  He said he was  depressed, suicidal.  He claimed that he was hopeless.  He did not like  living with his mother and would rather live in a place like [facility].  He was not sleeping well.   PAST PSYCHIATRIC HISTORY:  Prior hospitalizations at [facility].   SUBSTANCE ABUSE HISTORY:  He denied the use or abuse of any substances.   PAST MEDICAL HISTORY:  Outpatient clinic at [facility].   MEDICATIONS:  1. Lithium 300 mg in the morning and 900 mg at night.  2. Prolixin 7.5 mg at bedtime.  3. Cogentin 1 mg at bedtime.   PHYSICAL EXAMINATION:  Physical examination was performed, failed to show  any acute findings.   MENTAL STATUS EXAM:  Mental status exam revealed an anxious, alert,  cooperative male, clear sensorium, poor eye contact.  Normal speech.  Thought processes revealed some paranoia. , some ideas of reference, some  persecutory ideation.  He denied any auditory and visual hallucinations.  He  claimed that he had heard voices in the past but not recently.  Cognitive:  Cognition was  preserved.   ADMISSION DIAGNOSES:   AXIS I:  Schizophrenia, chronic, undifferentiated.   AXIS II:  Borderline intellectual functioning.   AXIS III:  No diagnosis.   AXIS IV:  Moderate.   AXIS V:  Global assessment of functioning upon admission 30, highest global  assessment of functioning in the last year 55.   HOSPITAL COURSE:  He was admitted and started in intensive individual and  group psychotherapy.  He was given the Ambien for sleep, lithium carbonate  300 mg in the morning and 900 mg at night, Prolixin 7.5 mg at bedtime,  Cogentin 1 mg at bedtime.  He stated that his mother overreacted to him  missing a couple of appointments.  He said he understood he needed to take  his medication.  He denied any active symptoms, although  he seemed to be  minimizing.  On January 5, he felt he was ready to be discharged, had been  pleasant, cooperative.  He called his mother and his mother came.  She was  willing to pick him up and take him back home.  She felt very comfortable  that he was doing well.  He was denying any suicidal or homicidal ideation.  There was no evidence of active psychosis.  He was on his medications so  went ahead and discharged him to outpatient followup.   DISCHARGE DIAGNOSES:   AXIS I:  Schizophrenia, chronic, undifferentiated.   AXIS II:  Borderline intellectual functioning.   AXIS III:  Arterial hypertension.   AXIS IV:  Moderate.   AXIS V:  Global assessment of functioning upon discharge 50.   DISCHARGE MEDICATIONS:  1. Lithium carbonate 300 mg two in the morning and two at bedtime.  2. Prolixin 5 mg one and a half at bedtime.  3. Cogentin 1 mg at bedtime.   FOLLOW UP:  [facility] and with his primary  physician for his high blood pressure.                                                Geoffery Lyons, M.D.    IL/MEDQ  D:  02/21/2002  T:  02/22/2002  Job:  [id number]

## 2010-07-04 ENCOUNTER — Emergency Department (HOSPITAL_COMMUNITY)
Admission: EM | Admit: 2010-07-04 | Discharge: 2010-07-06 | Disposition: A | Discharge status: Home / Self Care | Payer: Medicaid Other | Attending: Emergency Medicine | Admitting: Emergency Medicine

## 2010-07-04 DIAGNOSIS — R45851 Suicidal ideations: Secondary | ICD-10-CM | POA: Insufficient documentation

## 2010-07-04 DIAGNOSIS — I10 Essential (primary) hypertension: Secondary | ICD-10-CM | POA: Insufficient documentation

## 2010-07-04 DIAGNOSIS — F3130 Bipolar disorder, current episode depressed, mild or moderate severity, unspecified: Secondary | ICD-10-CM | POA: Insufficient documentation

## 2010-07-04 DIAGNOSIS — F29 Unspecified psychosis not due to a substance or known physiological condition: Secondary | ICD-10-CM | POA: Insufficient documentation

## 2010-07-04 LAB — RAPID URINE DRUG SCREEN, HOSP PERFORMED
Amphetamines: NOT DETECTED
Barbiturates: NOT DETECTED
Tetrahydrocannabinol: NOT DETECTED

## 2010-07-04 LAB — DIFFERENTIAL
Basophils Absolute: 0 10*3/uL (ref 0.0–0.1)
Eosinophils Absolute: 0.4 10*3/uL (ref 0.0–0.7)
Eosinophils Relative: 5 % (ref 0–5)
Lymphocytes Relative: 35 % (ref 12–46)
Neutrophils Relative %: 53 % (ref 43–77)

## 2010-07-04 LAB — COMPREHENSIVE METABOLIC PANEL
ALT: 30 U/L (ref 0–53)
AST: 41 U/L — ABNORMAL HIGH (ref 0–37)
Albumin: 3.6 g/dL (ref 3.5–5.2)
Alkaline Phosphatase: 69 U/L (ref 39–117)
Chloride: 104 mEq/L (ref 96–112)
Potassium: 3.6 mEq/L (ref 3.5–5.1)
Total Bilirubin: 0.4 mg/dL (ref 0.3–1.2)

## 2010-07-04 LAB — CBC
Platelets: 227 10*3/uL (ref 150–400)
RBC: 4.82 MIL/uL (ref 4.22–5.81)
RDW: 12.8 % (ref 11.5–15.5)
WBC: 8.6 10*3/uL (ref 4.0–10.5)

## 2010-07-06 DIAGNOSIS — F3112 Bipolar disorder, current episode manic without psychotic features, moderate: Secondary | ICD-10-CM

## 2010-07-14 ENCOUNTER — Emergency Department (HOSPITAL_COMMUNITY)
Admission: EM | Admit: 2010-07-14 | Discharge: 2010-07-16 | Disposition: A | Discharge status: Home / Self Care | Payer: Medicaid Other | Attending: Emergency Medicine | Admitting: Emergency Medicine

## 2010-07-14 DIAGNOSIS — Z79899 Other long term (current) drug therapy: Secondary | ICD-10-CM | POA: Insufficient documentation

## 2010-07-14 DIAGNOSIS — R45851 Suicidal ideations: Secondary | ICD-10-CM | POA: Insufficient documentation

## 2010-07-14 DIAGNOSIS — Z8659 Personal history of other mental and behavioral disorders: Secondary | ICD-10-CM | POA: Insufficient documentation

## 2010-07-14 DIAGNOSIS — F29 Unspecified psychosis not due to a substance or known physiological condition: Secondary | ICD-10-CM | POA: Insufficient documentation

## 2010-07-14 DIAGNOSIS — I10 Essential (primary) hypertension: Secondary | ICD-10-CM | POA: Insufficient documentation

## 2010-07-14 DIAGNOSIS — F3130 Bipolar disorder, current episode depressed, mild or moderate severity, unspecified: Secondary | ICD-10-CM | POA: Insufficient documentation

## 2010-07-14 LAB — DIFFERENTIAL
Eosinophils Relative: 3 % (ref 0–5)
Lymphocytes Relative: 36 % (ref 12–46)
Lymphs Abs: 2.9 10*3/uL (ref 0.7–4.0)
Monocytes Relative: 9 % (ref 3–12)

## 2010-07-14 LAB — RAPID URINE DRUG SCREEN, HOSP PERFORMED
Barbiturates: NOT DETECTED
Cocaine: NOT DETECTED
Opiates: NOT DETECTED

## 2010-07-14 LAB — ETHANOL: Alcohol, Ethyl (B): <11 mg/dL (ref 0–11)

## 2010-07-14 LAB — CBC
HCT: 39.2 % (ref 39.0–52.0)
MCV: 86 fL (ref 78.0–100.0)
RDW: 12.7 % (ref 11.5–15.5)
WBC: 8.1 10*3/uL (ref 4.0–10.5)

## 2010-07-14 LAB — COMPREHENSIVE METABOLIC PANEL
Albumin: 3.4 g/dL — ABNORMAL LOW (ref 3.5–5.2)
BUN: 18 mg/dL (ref 6–23)
CO2: 26 mEq/L (ref 19–32)
Chloride: 102 mEq/L (ref 96–112)
Creatinine, Ser: 1.93 mg/dL — ABNORMAL HIGH (ref 0.50–1.35)
GFR calc non Af Amer: 40 mL/min — ABNORMAL LOW (ref >60)
Total Bilirubin: 0.3 mg/dL (ref 0.3–1.2)

## 2010-09-05 ENCOUNTER — Emergency Department (HOSPITAL_COMMUNITY)
Admission: EM | Admit: 2010-09-05 | Discharge: 2010-09-05 | Disposition: A | Discharge status: Home / Self Care | Payer: Medicaid Other | Attending: Emergency Medicine | Admitting: Emergency Medicine

## 2010-09-05 DIAGNOSIS — R45851 Suicidal ideations: Secondary | ICD-10-CM | POA: Insufficient documentation

## 2010-09-05 DIAGNOSIS — R443 Hallucinations, unspecified: Secondary | ICD-10-CM | POA: Insufficient documentation

## 2010-09-05 DIAGNOSIS — I10 Essential (primary) hypertension: Secondary | ICD-10-CM | POA: Insufficient documentation

## 2010-09-05 DIAGNOSIS — Z8659 Personal history of other mental and behavioral disorders: Secondary | ICD-10-CM | POA: Insufficient documentation

## 2010-09-05 DIAGNOSIS — Z79899 Other long term (current) drug therapy: Secondary | ICD-10-CM | POA: Insufficient documentation

## 2010-09-05 DIAGNOSIS — F3130 Bipolar disorder, current episode depressed, mild or moderate severity, unspecified: Secondary | ICD-10-CM | POA: Insufficient documentation

## 2010-09-05 LAB — COMPREHENSIVE METABOLIC PANEL
ALT: 33 U/L (ref 0–53)
AST: 48 U/L — ABNORMAL HIGH (ref 0–37)
CO2: 27 mEq/L (ref 19–32)
Chloride: 101 mEq/L (ref 96–112)
GFR calc non Af Amer: 41 mL/min — ABNORMAL LOW (ref >60)
Sodium: 140 mEq/L (ref 135–145)
Total Bilirubin: 0.4 mg/dL (ref 0.3–1.2)

## 2010-09-05 LAB — CBC
Platelets: 231 10*3/uL (ref 150–400)
RBC: 4.95 MIL/uL (ref 4.22–5.81)
WBC: 9.7 10*3/uL (ref 4.0–10.5)

## 2010-09-05 LAB — DIFFERENTIAL
Basophils Absolute: 0 10*3/uL (ref 0.0–0.1)
Basophils Relative: 0 % (ref 0–1)
Eosinophils Absolute: 0.2 10*3/uL (ref 0.0–0.7)
Neutrophils Relative %: 57 % (ref 43–77)

## 2010-09-05 LAB — RAPID URINE DRUG SCREEN, HOSP PERFORMED
Amphetamines: NOT DETECTED
Barbiturates: NOT DETECTED
Cocaine: NOT DETECTED
Tetrahydrocannabinol: NOT DETECTED

## 2010-09-17 ENCOUNTER — Encounter: Payer: Self-pay | Admitting: Internal Medicine

## 2010-09-17 ENCOUNTER — Ambulatory Visit (INDEPENDENT_AMBULATORY_CARE_PROVIDER_SITE_OTHER): Payer: Medicaid Other | Admitting: Internal Medicine

## 2010-09-17 VITALS — BP 130/88 | HR 107 | Temp 95.5°F | Wt 229.1 lb

## 2010-09-17 DIAGNOSIS — I10 Essential (primary) hypertension: Secondary | ICD-10-CM

## 2010-09-17 MED ORDER — AMLODIPINE BESYLATE 10 MG PO TABS: 10.0000 mg | ORAL_TABLET | Freq: Every day | ORAL | Status: DC

## 2010-09-17 MED ORDER — FUROSEMIDE 20 MG PO TABS: 20.0000 mg | ORAL_TABLET | Freq: Every day | ORAL | Status: DC

## 2010-09-17 MED ORDER — LISINOPRIL 10 MG PO TABS: 10.0000 mg | ORAL_TABLET | Freq: Every day | ORAL | Status: DC

## 2010-09-17 NOTE — Assessment & Plan Note (Signed)
Blood pressure well controlled on current medication regimen. Will continue the same. Patient refused to have blood work done today but will needed prior to next visit. I have recommended checking blood work in next week he he changes his mind. We will need to followup on creatinine and potassium.

## 2010-09-17 NOTE — Progress Notes (Signed)
  Subjective:    Patient ID: Kyle Montgomery, male    DOB: 04-Feb-1975, 35 y.o.   MRN: [id number]  HPI Patient is 35 year old male with past medical history outlined below who presents to clinic for followup of blood pressure. He reports compliance with medications as well as recommended diet and exercise. He denies recent sicknesses, no chest pains or shortness of breath, no fevers or chills, no abdominal or urinary concerns   Review of Systems Per HPI      Objective:   Physical Exam  Constitutional: Vital signs reviewed.  Patient is a well-developed and well-nourished in no acute distress and cooperative with exam. Alert and oriented x3.  Cardiovascular: RRR, S1 normal, S2 normal, no MRG, pulses symmetric and intact bilaterally Pulmonary/Chest: CTAB, no wheezes, rales, or rhonchi Abdominal: Soft. Non-tender, non-distended, bowel sounds are normal, no masses, organomegaly, or guarding present.  Neurological: A&O x3, Strenght is normal and symmetric bilaterally, cranial nerve II-XII are grossly intact, no focal motor deficit, sensory intact to light touch bilaterally.  .         Assessment & Plan:

## 2010-10-17 LAB — POCT I-STAT, CHEM 8
BUN: 18
Calcium, Ion: 0.98 — ABNORMAL LOW
Creatinine, Ser: 2.1 — ABNORMAL HIGH
Glucose, Bld: 145 — ABNORMAL HIGH
TCO2: 26

## 2010-10-17 LAB — DIFFERENTIAL
Basophils Absolute: 0
Eosinophils Relative: 2
Lymphocytes Relative: 28
Monocytes Absolute: 0.5

## 2010-10-17 LAB — RAPID URINE DRUG SCREEN, HOSP PERFORMED
Cocaine: NOT DETECTED
Opiates: NOT DETECTED

## 2010-10-17 LAB — ETHANOL: Alcohol, Ethyl (B): <5

## 2010-10-17 LAB — CBC
HCT: 41.7
Hemoglobin: 14.2
RDW: 12.8

## 2010-10-18 LAB — HEPATIC FUNCTION PANEL
ALT: 42
Alkaline Phosphatase: 59
Bilirubin, Direct: 0.1
Indirect Bilirubin: 0.5
Total Bilirubin: 0.6

## 2010-12-04 ENCOUNTER — Telehealth: Payer: Self-pay | Admitting: *Deleted

## 2010-12-04 NOTE — Telephone Encounter (Signed)
Pt calls and states he needs "blood level done" an appt is made for Thursday w/ dr garg 11/15 at 1545 per doriss.

## 2010-12-06 ENCOUNTER — Encounter: Payer: Self-pay | Admitting: Internal Medicine

## 2010-12-06 ENCOUNTER — Ambulatory Visit (INDEPENDENT_AMBULATORY_CARE_PROVIDER_SITE_OTHER): Payer: Medicaid Other | Admitting: Internal Medicine

## 2010-12-06 VITALS — BP 137/96 | HR 90 | Temp 97.5°F | Wt 233.2 lb

## 2010-12-06 DIAGNOSIS — R635 Abnormal weight gain: Secondary | ICD-10-CM

## 2010-12-06 DIAGNOSIS — I10 Essential (primary) hypertension: Secondary | ICD-10-CM

## 2010-12-06 DIAGNOSIS — N259 Disorder resulting from impaired renal tubular function, unspecified: Secondary | ICD-10-CM

## 2010-12-06 LAB — LIPID PANEL
Cholesterol: 236 mg/dL — ABNORMAL HIGH (ref 0–200)
HDL: 33 mg/dL — ABNORMAL LOW (ref >39)
LDL Cholesterol: 135 mg/dL — ABNORMAL HIGH (ref 0–99)
Triglycerides: 341 mg/dL — ABNORMAL HIGH (ref <150)
VLDL: 68 mg/dL — ABNORMAL HIGH (ref 0–40)

## 2010-12-06 LAB — TSH: TSH: 1.277 u[IU]/mL (ref 0.350–4.500)

## 2010-12-06 MED ORDER — FUROSEMIDE 20 MG PO TABS: 20.0000 mg | ORAL_TABLET | Freq: Every day | ORAL | Status: DC

## 2010-12-06 MED ORDER — LISINOPRIL 10 MG PO TABS: 10.0000 mg | ORAL_TABLET | Freq: Every day | ORAL | Status: DC

## 2010-12-06 MED ORDER — AMLODIPINE BESYLATE 10 MG PO TABS: 10.0000 mg | ORAL_TABLET | Freq: Every day | ORAL | Status: DC

## 2010-12-06 NOTE — Patient Instructions (Signed)

## 2010-12-06 NOTE — Progress Notes (Signed)
  Subjective:    Patient ID: Kyle Montgomery, male    DOB: [birth year], 35 y.o.   MRN: [id number]  HPI Kyle Montgomery is a 35 year old man who is here today for a regular follow up. Patient is accompanied by her mother.  Patient's blood pressure is only mildly elevated today. He is compliant with his meds. Although his mother says that he has been non compliant with his diet and has not been exercising.   Patient has gained about 30 pounds in last 3 years. Both patient and her mother are worried about the weight gain and wants to lose weight.  He is making adequate amounts of urine and does not have any other complaints today.   Review of Systems  Constitutional: Negative for fever, activity change and appetite change.  HENT: Negative for sore throat.   Respiratory: Negative for cough and shortness of breath.   Cardiovascular: Negative for chest pain and leg swelling.  Gastrointestinal: Negative for nausea, abdominal pain, diarrhea, constipation and abdominal distention.  Genitourinary: Negative for frequency, hematuria and difficulty urinating.  Neurological: Negative for dizziness and headaches.  Psychiatric/Behavioral: Negative for suicidal ideas and behavioral problems.       Objective:   Physical Exam  Constitutional: He is oriented to person, place, and time. He appears well-developed and well-nourished.  HENT:  Head: Normocephalic and atraumatic.  Eyes: Conjunctivae and EOM are normal. Pupils are equal, round, and reactive to light. No scleral icterus.  Neck: Normal range of motion. Neck supple. No JVD present. No thyromegaly present.  Cardiovascular: Normal rate, regular rhythm, normal heart sounds and intact distal pulses.  Exam reveals no gallop and no friction rub.   No murmur heard. Pulmonary/Chest: Effort normal and breath sounds normal. No respiratory distress. He has no wheezes. He has no rales.  Abdominal: Soft. Bowel sounds are normal. He exhibits no distension and no  mass. There is no tenderness. There is no rebound and no guarding.  Musculoskeletal: Normal range of motion. He exhibits no edema and no tenderness.  Lymphadenopathy:    He has no cervical adenopathy.  Neurological: He is alert and oriented to person, place, and time.  Psychiatric: He has a normal mood and affect. His behavior is normal.          Assessment & Plan:

## 2010-12-07 DIAGNOSIS — R635 Abnormal weight gain: Secondary | ICD-10-CM | POA: Insufficient documentation

## 2010-12-07 LAB — COMPLETE METABOLIC PANEL WITH GFR
ALT: 28 U/L (ref 0–53)
AST: 49 U/L — ABNORMAL HIGH (ref 0–37)
Alkaline Phosphatase: 74 U/L (ref 39–117)
Creat: 1.8 mg/dL — ABNORMAL HIGH (ref 0.50–1.35)
Total Bilirubin: 0.5 mg/dL (ref 0.3–1.2)
Total Protein: 8.2 g/dL (ref 6.0–8.3)

## 2010-12-07 NOTE — Assessment & Plan Note (Signed)
I will obtain a TSH today. Patient is hyperglycemic in the past and I will check metabolic profile to assess random blood glucose. Given his weight gain he is at risk of developing diabetes. He was advised to start exercising and cut down on his weight.

## 2010-12-07 NOTE — Assessment & Plan Note (Signed)
His baseline and will check creatinine today. Blood pressure control will be the key along with continuing the lisinopril.

## 2010-12-07 NOTE — Assessment & Plan Note (Signed)
Will check creatinine and potassium today. Patient's blood pressure is well controlled. I have also asked him to follow a low-sodium diet and gave instructions on the discharge paper regarding 1.5 g low-sodium diet.

## 2010-12-24 ENCOUNTER — Ambulatory Visit (INDEPENDENT_AMBULATORY_CARE_PROVIDER_SITE_OTHER): Payer: Medicaid Other | Admitting: Internal Medicine

## 2010-12-24 ENCOUNTER — Encounter: Payer: Self-pay | Admitting: Internal Medicine

## 2010-12-24 VITALS — BP 123/84 | HR 105 | Temp 97.3°F | Ht 70.0 in | Wt 229.3 lb

## 2010-12-24 DIAGNOSIS — N259 Disorder resulting from impaired renal tubular function, unspecified: Secondary | ICD-10-CM

## 2010-12-24 DIAGNOSIS — I10 Essential (primary) hypertension: Secondary | ICD-10-CM

## 2010-12-24 DIAGNOSIS — N189 Chronic kidney disease, unspecified: Secondary | ICD-10-CM

## 2010-12-24 MED ORDER — LISINOPRIL 10 MG PO TABS: 10.0000 mg | ORAL_TABLET | Freq: Every day | ORAL | Status: DC

## 2010-12-24 MED ORDER — AMLODIPINE BESYLATE 10 MG PO TABS: 10.0000 mg | ORAL_TABLET | Freq: Every day | ORAL | Status: DC

## 2010-12-24 NOTE — Progress Notes (Addendum)
  Subjective:    Patient ID: Kyle Montgomery, male    DOB: 26-Mar-1975, 35 y.o.   MRN: [id number]  HPI Patient is a 35 year old male with a past medical history listed below, presents to the outpatient clinic for two-week followup after he was restarted on his blood pressure medication, patient reports that he has been taking his medication and denies any complaints with them. Otherwise states feeling well has no other complaints today. Note the patient is up-to-date on his health maintenance  Patient Active Problem List  Diagnoses  . HYPERPROLACTINEMIA  . DISORDER, BIPOLAR NOS  . HYPERTENSION  . RENAL INSUFFICIENCY  . GYNECOMASTIA  . Weight gain   Current Outpatient Prescriptions on File Prior to Visit  Medication Sig Dispense Refill  . benztropine (COGENTIN) 1 MG tablet Take 1 mg by mouth daily.        [name] valproic acid (DEPAKENE) 250 MG capsule Take 250 mg by mouth 2 (two) times daily.        . ziprasidone (GEODON) 40 MG capsule Take 40 mg by mouth 2 (two) times daily with a meal.         No Known Allergies   Review of Systems  All other systems reviewed and are negative.       Objective:   Physical Exam  Constitutional: He is oriented to person, place, and time. He appears well-developed and well-nourished.  HENT:  Head: Normocephalic and atraumatic.  Eyes: Pupils are equal, round, and reactive to light.  Neck: Normal range of motion. No JVD present. No thyromegaly present.  Cardiovascular: Normal rate, regular rhythm and normal heart sounds.   Pulmonary/Chest: Effort normal and breath sounds normal. He has no wheezes. He has no rales.  Abdominal: Soft. Bowel sounds are normal. There is no tenderness. There is no rebound.  Musculoskeletal: Normal range of motion. He exhibits no edema.  Neurological: He is alert and oriented to person, place, and time.  Skin: Skin is warm and dry.          Assessment & Plan:

## 2010-12-24 NOTE — Assessment & Plan Note (Signed)
Patient's blood pressure today is within goal, continue current medications, recheck blood pressure at next followup

## 2010-12-24 NOTE — Patient Instructions (Signed)
Stop taking lasix.   

## 2010-12-24 NOTE — Assessment & Plan Note (Signed)
Patient is at his baseline creatinine 1.8, unknown cause of his renal disease. Will refer him to nephrology to establish care. No labs needed today

## 2011-02-10 ENCOUNTER — Emergency Department (HOSPITAL_COMMUNITY)
Admission: EM | Admit: 2011-02-10 | Discharge: 2011-02-10 | Disposition: A | Discharge status: Home / Self Care | Payer: Medicaid Other | Attending: Emergency Medicine | Admitting: Emergency Medicine

## 2011-02-10 ENCOUNTER — Emergency Department (HOSPITAL_COMMUNITY): Payer: Medicaid Other

## 2011-02-10 ENCOUNTER — Encounter (HOSPITAL_COMMUNITY): Payer: Self-pay | Admitting: *Deleted

## 2011-02-10 DIAGNOSIS — I10 Essential (primary) hypertension: Secondary | ICD-10-CM | POA: Insufficient documentation

## 2011-02-10 DIAGNOSIS — R443 Hallucinations, unspecified: Secondary | ICD-10-CM | POA: Insufficient documentation

## 2011-02-10 DIAGNOSIS — F411 Generalized anxiety disorder: Secondary | ICD-10-CM | POA: Insufficient documentation

## 2011-02-10 DIAGNOSIS — F319 Bipolar disorder, unspecified: Secondary | ICD-10-CM | POA: Insufficient documentation

## 2011-02-10 DIAGNOSIS — Z79899 Other long term (current) drug therapy: Secondary | ICD-10-CM | POA: Insufficient documentation

## 2011-02-10 LAB — COMPREHENSIVE METABOLIC PANEL
ALT: 25 U/L (ref 0–53)
AST: 35 U/L (ref 0–37)
Calcium: 9.5 mg/dL (ref 8.4–10.5)
Sodium: 145 mEq/L (ref 135–145)
Total Protein: 8.1 g/dL (ref 6.0–8.3)

## 2011-02-10 LAB — CBC
MCH: 30 pg (ref 26.0–34.0)
MCV: 87.8 fL (ref 78.0–100.0)
Platelets: 245 10*3/uL (ref 150–400)
RDW: 12.7 % (ref 11.5–15.5)
WBC: 10.6 10*3/uL — ABNORMAL HIGH (ref 4.0–10.5)

## 2011-02-10 LAB — URINALYSIS, ROUTINE W REFLEX MICROSCOPIC
Bilirubin Urine: NEGATIVE
Hgb urine dipstick: NEGATIVE
Nitrite: NEGATIVE
Specific Gravity, Urine: 1.008 (ref 1.005–1.030)
pH: 6 (ref 5.0–8.0)

## 2011-02-10 LAB — DIFFERENTIAL
Basophils Absolute: 0 10*3/uL (ref 0.0–0.1)
Eosinophils Absolute: 0.3 10*3/uL (ref 0.0–0.7)
Eosinophils Relative: 3 % (ref 0–5)

## 2011-02-10 LAB — RAPID URINE DRUG SCREEN, HOSP PERFORMED
Barbiturates: NOT DETECTED
Benzodiazepines: NOT DETECTED
Cocaine: NOT DETECTED
Tetrahydrocannabinol: NOT DETECTED

## 2011-02-10 NOTE — ED Notes (Signed)
Reports needing medical clearance for hearing voices and having hallucinations. Denies any SI or HI at this time. Calm and cooperative at triage.

## 2011-02-10 NOTE — BH Assessment (Signed)
Assessment Note   Kyle Montgomery is an 36 y.o. male. Pt states that he came to hospital to get his blood pressure, arch in his foot checked and for an overall exam; pt denied any current or past SI and HI attempts and plans; pt states that he is doing very well and states that he would never want to harm self; pt denied any current or past AVH; counselor explained to pt that he spoke to the doctor about hallucinations after watching a scary movie and pt states that he did not have any hallucinations and that he slept well last night; cm again asked pt what brought him to the hospital and he stated that he wanted an exam to make sure he was going good; pt states that he is on blood pressure medication and states that he has never abused any drugs nor alcohol nor any medications; cm questioned pt again about AVH and pt denied; cm went over a no harm contract with pt and explained that if he felt that he was in danger to himself or others that he could call the police or report back to the hospital; pt agreed; spoke with Dr. Manus Gunning and he stated that pt could be discharged  Axis I: none presenting Axis II: Deferred Axis III:  Past Medical History  Diagnosis Date  . Hypertension   . Renal insufficiency     baseline Cr 2.2 - renal US with increased Echo signal, no nephrology eval, no bx  . Gynecomastia 02/07    normal pituitary by MRI  . Hyperprolactinemia 09/27/04    felt 2/2 lithium and fluphenazine  . Bipolar affective     followed by mental health   Axis IV: none noted or described Axis V: 72 or above  Past Medical History:  Past Medical History  Diagnosis Date  . Hypertension   . Renal insufficiency     baseline Cr 2.2 - renal US with increased Echo signal, no nephrology eval, no bx  . Gynecomastia 02/07    normal pituitary by MRI  . Hyperprolactinemia 09/27/04    felt 2/2 lithium and fluphenazine  . Bipolar affective     followed by mental health    History reviewed. No pertinent  past surgical history.  Family History:  Family History  Problem Relation Age of Onset  . Hyperlipidemia Mother   . Hypertension Mother   . Atrial fibrillation Mother   . Hypertension Brother   . Hypertension Maternal Grandmother   . Cancer Maternal Grandmother     Social History:  reports that he has quit smoking. His smoking use included Cigarettes. He does not have any smokeless tobacco history on file. He reports that he does not drink alcohol or use illicit drugs.  Additional Social History:  Alcohol / Drug Use Pain Medications: none stated Prescriptions: none stated Over the Counter: none stated History of alcohol / drug use?: No history of alcohol / drug abuse Allergies: No Known Allergies  Home Medications:  No current facility-administered medications on file as of 02/10/2011.   Medications Prior to Admission  Medication Sig Dispense Refill  . amLODipine (NORVASC) 10 MG tablet Take 1 tablet (10 mg total) by mouth daily.  30 tablet  11  . benztropine (COGENTIN) 1 MG tablet Take 1 mg by mouth daily.        [name] lisinopril (PRINIVIL,ZESTRIL) 10 MG tablet Take 1 tablet (10 mg total) by mouth daily.  30 tablet  11  . ziprasidone (GEODON) 40  MG capsule Take 40 mg by mouth 2 (two) times daily.         OB/GYN Status:  No LMP for male patient.  General Assessment Data Location of Assessment: [facility] ED ACT Assessment: Yes Living Arrangements: Family members Can pt return to current living arrangement?: Yes Admission Status: Voluntary Is patient capable of signing voluntary admission?: Yes Transfer from: Home Referral Source: Self/Family/Friend  Education Status Is patient currently in school?: No  Risk to self Suicidal Ideation: No Suicidal Intent: No Is patient at risk for suicide?: No Suicidal Plan?: No Access to Means: No What has been your use of drugs/alcohol within the last 12 months?: none stated Previous Attempts/Gestures: No How many times?: 0  Other Self Harm  Risks: none Intentional Self Injurious Behavior: None Family Suicide History: No Recent stressful life event(s): Other (Comment) (none stated) Persecutory voices/beliefs?: No Depression: No Substance abuse history and/or treatment for substance abuse?: No Suicide prevention information given to non-admitted patients: Yes  Risk to Others Homicidal Ideation: No Thoughts of Harm to Others: No Current Homicidal Intent: No Current Homicidal Plan: No Access to Homicidal Means: No Identified Victim: none  History of harm to others?: No Assessment of Violence: None Noted Violent Behavior Description: cooperative during assessment  Does patient have access to weapons?: No Criminal Charges Pending?: No Does patient have a court date: No  Psychosis Hallucinations: None noted Delusions: None noted  Mental Status Report Appear/Hygiene: Other (Comment) (casual) Eye Contact: Good Motor Activity: Freedom of movement Speech: Logical/coherent Level of Consciousness: Alert Mood: Other (Comment) (pleasant) Affect: Appropriate to circumstance Anxiety Level: None Thought Processes: Coherent Judgement: Unimpaired Orientation: Person;Place;Time;Situation;Appropriate for developmental age Obsessive Compulsive Thoughts/Behaviors: None  Cognitive Functioning Concentration: Normal Memory: Recent Intact;Remote Intact IQ: Average Insight: Good Impulse Control: Good Appetite: Good Weight Loss: 0  Weight Gain: 0  Sleep: No Change Total Hours of Sleep: 8  Vegetative Symptoms: None  Prior Inpatient Therapy Prior Inpatient Therapy: No  Prior Outpatient Therapy Prior Outpatient Therapy: No          Abuse/Neglect Assessment (Assessment to be complete while patient is alone) Physical Abuse: Denies Verbal Abuse: Denies Sexual Abuse: Denies Exploitation of patient/patient's resources: Denies Self-Neglect: Denies Values / Beliefs Cultural Requests During Hospitalization:  None Spiritual Requests During Hospitalization: None Consults Spiritual Care Consult Needed: No Social Work Consult Needed: No      Additional Information 1:1 In Past 12 Months?: No CIRT Risk: No Elopement Risk: No Does patient have medical clearance?: Yes     Disposition: pt will be discharged home; spoke with Dr. Manus Gunning about pt denying SI, HI, and AVH; pt signed no harm contract Disposition Disposition of Patient: Other dispositions (pt denied SI, HI and AVH) Other disposition(s): Information only (signed no harm contract)  On Site Evaluation by:   Reviewed with Physician:     Earmon Phoenix 02/10/2011 2:54 PM

## 2011-02-10 NOTE — ED Notes (Signed)
Discharge instructions reviewed; verbalizes understanding.  No questions asked; no further c/o's voiced.  Ambulatory to lobby. 

## 2011-02-10 NOTE — ED Provider Notes (Addendum)
History     CSN: [id number]  Arrival date & time 02/10/11  1132   First MD Initiated Contact with Patient 02/10/11 1214      Chief Complaint  Patient presents with  . Medical Clearance    (Consider location/radiation/quality/duration/timing/severity/associated sxs/prior treatment) HPI Comments: Patient presenting for "general medical exam". He states his mother is worried about him because he will have images. He sees images from the movies after he watches them.. When he is in his room in the dark he has had been seeing images from the movie and hearing voices.  Denies any suicidal ideation, homicidal ideation, drug or alcohol use. He denies any physical complaints.  States he's been eating well and urinating well and taking his medications as instructed.  The history is provided by the patient.    Past Medical History  Diagnosis Date  . Hypertension   . Renal insufficiency     baseline Cr 2.2 - renal US with increased Echo signal, no nephrology eval, no bx  . Gynecomastia 02/07    normal pituitary by MRI  . Hyperprolactinemia 09/27/04    felt 2/2 lithium and fluphenazine  . Bipolar affective     followed by mental health    History reviewed. No pertinent past surgical history.  Family History  Problem Relation Age of Onset  . Hyperlipidemia Mother   . Hypertension Mother   . Atrial fibrillation Mother   . Hypertension Brother   . Hypertension Maternal Grandmother   . Cancer Maternal Grandmother     History  Substance Use Topics  . Smoking status: Former Smoker    Types: Cigarettes  . Smokeless tobacco: Not on file  . Alcohol Use: No      Review of Systems  Constitutional: Negative for fever, activity change and appetite change.  HENT: Negative for congestion and rhinorrhea.   Respiratory: Negative for cough and shortness of breath.   Cardiovascular: Negative for chest pain.  Gastrointestinal: Negative for nausea, vomiting and abdominal pain.    Genitourinary: Negative for dysuria and hematuria.  Musculoskeletal: Negative for back pain.  Skin: Negative for rash.  Neurological: Negative for headaches.    Allergies  Review of patient's allergies indicates no known allergies.  Home Medications   Current Outpatient Rx  Name Route Sig Dispense Refill  . AMLODIPINE BESYLATE 10 MG PO TABS Oral Take 1 tablet (10 mg total) by mouth daily. 30 tablet 11  . BENZTROPINE MESYLATE 1 MG PO TABS Oral Take 1 mg by mouth daily.      . IBUPROFEN 200 MG PO TABS Oral Take 200 mg by mouth every 6 (six) hours as needed. For foot pain    . LISINOPRIL 10 MG PO TABS Oral Take 1 tablet (10 mg total) by mouth daily. 30 tablet 11  . VALPROIC ACID 250 MG/5ML PO SYRP Oral Take 250 mg by mouth 2 (two) times daily.    [name] ZIPRASIDONE HCL 40 MG PO CAPS Oral Take 40 mg by mouth 2 (two) times daily.       BP 134/82  Pulse 115  Temp(Src) 98 F (36.7 C) (Oral)  Resp 18  SpO2 98%  Physical Exam  Constitutional: He is oriented to person, place, and time. He appears well-developed and well-nourished. No distress.  HENT:  Head: Normocephalic and atraumatic.  Mouth/Throat: Oropharynx is clear and moist. No oropharyngeal exudate.  Eyes: Conjunctivae and EOM are normal. Pupils are equal, round, and reactive to light.  Neck: Normal range of motion. Neck  supple.  Cardiovascular: Normal rate, regular rhythm and normal heart sounds.   Pulmonary/Chest: Effort normal and breath sounds normal. No respiratory distress.  Abdominal: Soft. There is no tenderness. There is no rebound and no guarding.  Musculoskeletal: Normal range of motion. He exhibits no edema and no tenderness.  Neurological: He is alert and oriented to person, place, and time. No cranial nerve deficit.  Skin: Skin is warm.  Psychiatric: His mood appears anxious. His speech is tangential. He is slowed. He expresses no homicidal and no suicidal ideation. He expresses no suicidal plans and no homicidal  plans.    ED Course  Procedures (including critical care time)  Labs Reviewed  CBC - Abnormal; Notable for the following:    WBC 10.6 (*)    All other components within normal limits  COMPREHENSIVE METABOLIC PANEL - Abnormal; Notable for the following:    Glucose, Bld 138 (*)    Creatinine, Ser 1.69 (*)    Albumin 3.3 (*)    GFR calc non Af Amer 51 (*)    GFR calc Af Amer 59 (*)    All other components within normal limits  URINALYSIS, ROUTINE W REFLEX MICROSCOPIC - Abnormal; Notable for the following:    Protein, ur 30 (*)    All other components within normal limits  DIFFERENTIAL  URINE RAPID DRUG SCREEN (HOSP PERFORMED)  ETHANOL  VALPROIC ACID LEVEL  URINE MICROSCOPIC-ADD ON   Ct Head Wo Contrast  02/10/2011  *RADIOLOGY REPORT*  Clinical Data: Hallucinations  CT HEAD WITHOUT CONTRAST  Technique:  Contiguous axial images were obtained from the base of the skull through the vertex without contrast.  Comparison: MRI brain dated 03/01/2005  Findings: No evidence of parenchymal hemorrhage or extra-axial fluid collection. No mass lesion, mass effect, or midline shift.  No CT evidence of acute infarction.  Cerebral volume is age appropriate.  No ventriculomegaly.  Mild mucosal thickening in the bilateral ethmoid and sphenoid sinuses.  Mastoid air cells are clear.  No evidence of calvarial fracture.  IMPRESSION: No evidence of acute intracranial abnormality.  Original Report Authenticated By: Charline Bills, M.D.     1. Bipolar disorder       MDM  Patient presents for medical evaluation and has no thoughts of hurting himself or hurting anywhere else. He denies any physical complaint. His mother was concerned because he remembers images from scary movies. He does have some tangential speech but is not manic or psychotic. Discussed with ACT team and they agreed.  Patient is stable for discharge.  Screening labs within normal limits. Creatinine improved from baseline. Valproic  level therapeutic   Glynn Octave, MD 02/10/11 1712  Glynn Octave, MD 02/10/11 [phone]

## 2011-04-10 ENCOUNTER — Encounter: Payer: Medicaid Other | Admitting: Internal Medicine

## 2011-12-24 ENCOUNTER — Encounter: Payer: Self-pay | Admitting: Radiation Oncology

## 2011-12-25 ENCOUNTER — Other Ambulatory Visit: Payer: Self-pay | Admitting: Internal Medicine

## 2011-12-30 ENCOUNTER — Other Ambulatory Visit: Payer: Self-pay | Admitting: Internal Medicine

## 2012-01-07 ENCOUNTER — Encounter: Payer: Medicaid Other | Admitting: Radiation Oncology

## 2012-01-09 ENCOUNTER — Telehealth: Payer: Self-pay | Admitting: *Deleted

## 2012-01-09 ENCOUNTER — Encounter: Payer: Self-pay | Admitting: Internal Medicine

## 2012-01-09 ENCOUNTER — Ambulatory Visit: Payer: Medicaid Other | Admitting: Internal Medicine

## 2012-01-09 ENCOUNTER — Ambulatory Visit (HOSPITAL_COMMUNITY)
Admission: RE | Admit: 2012-01-09 | Discharge: 2012-01-09 | Disposition: A | Discharge status: Home / Self Care | Payer: Medicaid Other | Source: Ambulatory Visit | Attending: Internal Medicine | Admitting: Internal Medicine

## 2012-01-09 ENCOUNTER — Ambulatory Visit (INDEPENDENT_AMBULATORY_CARE_PROVIDER_SITE_OTHER): Payer: Medicaid Other | Admitting: Internal Medicine

## 2012-01-09 VITALS — BP 150/98 | HR 67 | Temp 98.1°F | Ht 70.0 in | Wt 213.2 lb

## 2012-01-09 DIAGNOSIS — F319 Bipolar disorder, unspecified: Secondary | ICD-10-CM

## 2012-01-09 DIAGNOSIS — N289 Disorder of kidney and ureter, unspecified: Secondary | ICD-10-CM

## 2012-01-09 DIAGNOSIS — N189 Chronic kidney disease, unspecified: Secondary | ICD-10-CM | POA: Insufficient documentation

## 2012-01-09 DIAGNOSIS — I10 Essential (primary) hypertension: Secondary | ICD-10-CM

## 2012-01-09 DIAGNOSIS — I129 Hypertensive chronic kidney disease with stage 1 through stage 4 chronic kidney disease, or unspecified chronic kidney disease: Secondary | ICD-10-CM | POA: Insufficient documentation

## 2012-01-09 DIAGNOSIS — Z79899 Other long term (current) drug therapy: Secondary | ICD-10-CM | POA: Insufficient documentation

## 2012-01-09 DIAGNOSIS — N259 Disorder resulting from impaired renal tubular function, unspecified: Secondary | ICD-10-CM

## 2012-01-09 DIAGNOSIS — Z23 Encounter for immunization: Secondary | ICD-10-CM

## 2012-01-09 LAB — COMPREHENSIVE METABOLIC PANEL
Albumin: 4.2 g/dL (ref 3.5–5.2)
CO2: 28 mEq/L (ref 19–32)
Calcium: 9.7 mg/dL (ref 8.4–10.5)
Chloride: 105 mEq/L (ref 96–112)
Glucose, Bld: 91 mg/dL (ref 70–99)
Potassium: 3.7 mEq/L (ref 3.5–5.3)
Sodium: 143 mEq/L (ref 135–145)
Total Bilirubin: 0.4 mg/dL (ref 0.3–1.2)
Total Protein: 7.3 g/dL (ref 6.0–8.3)

## 2012-01-09 MED ORDER — AMLODIPINE BESYLATE 10 MG PO TABS: 10.0000 mg | ORAL_TABLET | Freq: Every day | ORAL | Status: DC

## 2012-01-09 MED ORDER — LISINOPRIL 10 MG PO TABS: 10.0000 mg | ORAL_TABLET | Freq: Every day | ORAL | Status: DC

## 2012-01-09 NOTE — Patient Instructions (Addendum)
General Instructions:  Please follow-up at the clinic in 6 months with your PCP at which time we will reevaluate  - OR, please follow-up in the clinic sooner if needed.  There have been changes in your medications - REFILLS HAVE BEEN SENT OF YOUR LISINOPRIL AND AMLODIPINE    You are getting labs today, if they are abnormal I will give you a call.   If symptoms worsen, or new symptoms arise, please call the clinic or go to the ER.  PLEASE BRING ALL OF YOUR MEDICATIONS  IN A BAG TO YOUR NEXT APPOINTMENT   Treatment Goals:  Goals (1 Years of Data) as of 01/09/2012          As of Today 01/07/12 01/07/12 01/07/12 02/10/11     Blood Pressure    . Blood Pressure < 140/90  150/98 99/64 93/68  117/75 134/82      Progress Toward Treatment Goals:    Self Care Goals & Plans:  Self Care Goal 01/09/2012  Eat healthy foods eat foods that are low in salt  Be physically active take a walk every day

## 2012-01-09 NOTE — Assessment & Plan Note (Signed)
Pertinent Data: EKG (01/09/2012): sinus bradycardia, normal axis, nonspecific ST/T wave changes. QTc 445 msec.  Assessment: We were requested to perform an EKG today, as the patient's Geodon has recently been increased to 80 mg twice daily from prior 40 mg twice daily. QTC is within normal range.  Plan:      Will send EKG to the psychiatrist.

## 2012-01-09 NOTE — Assessment & Plan Note (Signed)
Pertinent Data: BP Readings from Last 3 Encounters:  01/09/12 150/98  01/07/12 99/64  02/10/11 134/82    Basic Metabolic Panel:    Component Value Date/Time   NA 145 02/10/2011 1306   K 3.8 02/10/2011 1306   CL 107 02/10/2011 1306   CO2 26 02/10/2011 1306   BUN 11 02/10/2011 1306   CREATININE 1.69* 02/10/2011 1306   CREATININE 1.80* 12/06/2010 1619   GLUCOSE 138* 02/10/2011 1306   CALCIUM 9.5 02/10/2011 1306    Assessment: Disease Control:   not controlled   Progress toward goals:   deteriorated   Barriers to meeting goals: nonadherence to medications - while he was having poorly controlled bipolar disorder, the patient was not following up regularly and did not have appropriate blood pressure followup to allow for safe refills.     Plan:  Refill his lisinopril and amlodipine today.  Check basic metabolic panel.  Educational resources provided: brochure  Self management tools provided:

## 2012-01-09 NOTE — Telephone Encounter (Signed)
Agree. thanks

## 2012-01-09 NOTE — Progress Notes (Signed)
   [name]   MRN: [id number]  DOB: 1975/11/13  PCP: Elfredia Nevins, MD   Subjective:    HPI: [name] is a 36 y.o. male with a PMHx of bipolar affective disorder, hypertension (intermittently compliant with medications), and chronic kidney disease who presented to clinic today for the following:  1) HTN - Patient does check blood pressure regularly at home - 150s mmHg systolic. Patient states that he is currently supposed to be taking Lisinopril and Amlodipine - has been out for > 1 month because he has not been following up in the clinic. However, it seems that after calling the pharmacy that he has gotten his amlodipine intermittently possibly last month and.  denies headaches, dizziness, lightheadedness, chest pain, shortness of breath.  does request refills today.  2) Renal insufficiency - patient states that his urinating well without dysuria, hematuria, flank pain. Has good urinary output and stream.   3) Bipolar disease - patient was recently increased of his medications by his psychiatrist who requests an EKG today, to evaluate for QT prolongation in the setting of his increased Cogentin dosing.   Review of Systems: Per HPI.   Current Outpatient Medications: Medication Sig  . benztropine (COGENTIN) 1 MG tablet Take 1 mg by mouth at bedtime.   . divalproex (DEPAKOTE ER) 500 MG 24 hr tablet Take 500 mg by mouth daily.  [name] lisinopril (PRINIVIL,ZESTRIL) 10 MG tablet Take 1 tablet (10 mg total) by mouth daily.  . ziprasidone (GEODON) 80 MG capsule Take 80 mg by mouth 2 (two) times daily with a meal.  . amLODipine (NORVASC) 10 MG tablet Take 1 tablet (10 mg total) by mouth daily.    Allergies No Known Allergies   Past Medical History  Diagnosis Date  . Hypertension   . Renal insufficiency     baseline Cr 2.2 - renal US with increased Echo signal, no nephrology eval, no bx  . Gynecomastia 02/07    normal pituitary by MRI  . Hyperprolactinemia 09/27/04   felt 2/2 lithium and fluphenazine  . Bipolar affective     followed by mental health    Surgical history No past surgical history on file.   Objective:    Physical Exam: Filed Vitals:   01/09/12 0916  BP: 150/98  Pulse: 67  Temp: 98.1 F (36.7 C)     General: Vital signs reviewed and noted. Well-developed, well-nourished, in no acute distress; alert, appropriate and cooperative throughout examination.  Head: Normocephalic, atraumatic.  Lungs:  Normal respiratory effort. Clear to auscultation BL without crackles or wheezes.  Heart: RRR. S1 and S2 normal without gallop, rubs. No murmur.  Abdomen:  BS normoactive. Soft, Nondistended, non-tender.  No masses or organomegaly.  Extremities: No pretibial edema.    Assessment/ Plan:   Case and plan of care discussed with attending physician, Dr. Blanch Media.

## 2012-01-09 NOTE — Assessment & Plan Note (Signed)
Pertinent Data:  Ref. Range 12/06/2010 02/10/2011  Creatinine Latest Range: 0.50-1.35 mg/dL 4.09 (H) 8.11 (H)  GFR calc Af Amer Latest Range: >90 mL/min  59 (L)    Assessment: Likely secondary to medical renal disease in the setting of his uncontrolled hypertension. Last renal ultrasound in 2006 did show mild increased echogenicity of bilateral kidneys - suggestive of medical renal disease. However, a left renal cyst was noted.  Plan:      Recheck basic metabolic panel today.   Check microalb/cr.  Check urinalysis with micro.

## 2012-01-09 NOTE — Telephone Encounter (Signed)
Pt presents w/ caretaker, states he did not mean to miss his appt on12/17 but got mixed up, he is cautioned that he must be seen on a regular basis and keep appts, he and caretaker voice understanding. Needs refills on bp meds, scheduled w/ dr Clyde Lundborg at [phone]

## 2012-01-10 ENCOUNTER — Encounter: Payer: Self-pay | Admitting: Internal Medicine

## 2012-01-10 DIAGNOSIS — R809 Proteinuria, unspecified: Secondary | ICD-10-CM | POA: Insufficient documentation

## 2012-01-10 LAB — URINALYSIS, ROUTINE W REFLEX MICROSCOPIC
Bilirubin Urine: NEGATIVE
Hgb urine dipstick: NEGATIVE
Ketones, ur: NEGATIVE mg/dL
Protein, ur: NEGATIVE mg/dL
Urobilinogen, UA: 0.2 mg/dL (ref 0.0–1.0)

## 2012-01-10 LAB — MICROALBUMIN / CREATININE URINE RATIO
Creatinine, Urine: 39.4 mg/dL
Microalb, Ur: 15.23 mg/dL — ABNORMAL HIGH (ref 0.00–1.89)

## 2012-02-23 ENCOUNTER — Encounter: Payer: Self-pay | Admitting: Radiation Oncology

## 2012-03-30 ENCOUNTER — Other Ambulatory Visit: Payer: Self-pay | Admitting: *Deleted

## 2012-03-30 DIAGNOSIS — I10 Essential (primary) hypertension: Secondary | ICD-10-CM

## 2012-03-30 MED ORDER — AMLODIPINE BESYLATE 10 MG PO TABS: 10.0000 mg | ORAL_TABLET | Freq: Every day | ORAL | Status: DC

## 2012-03-30 MED ORDER — LISINOPRIL 10 MG PO TABS: 10.0000 mg | ORAL_TABLET | Freq: Every day | ORAL | Status: DC

## 2012-03-30 NOTE — Telephone Encounter (Signed)
[facility] sent to dr Ivin Poot for refills

## 2012-07-10 ENCOUNTER — Other Ambulatory Visit (HOSPITAL_COMMUNITY): Payer: Self-pay | Admitting: Psychiatry

## 2012-07-11 ENCOUNTER — Other Ambulatory Visit (HOSPITAL_COMMUNITY): Payer: Self-pay | Admitting: Psychiatry

## 2012-07-17 NOTE — Progress Notes (Signed)
This encounter was created in error - please disregard.

## 2012-07-29 ENCOUNTER — Ambulatory Visit (INDEPENDENT_AMBULATORY_CARE_PROVIDER_SITE_OTHER): Payer: Medicaid Other | Admitting: Internal Medicine

## 2012-07-29 ENCOUNTER — Encounter: Payer: Self-pay | Admitting: Internal Medicine

## 2012-07-29 ENCOUNTER — Encounter: Payer: Self-pay | Admitting: *Deleted

## 2012-07-29 VITALS — BP 132/96 | HR 78 | Temp 97.9°F | Ht 72.0 in | Wt 208.8 lb

## 2012-07-29 DIAGNOSIS — N289 Disorder of kidney and ureter, unspecified: Secondary | ICD-10-CM

## 2012-07-29 DIAGNOSIS — I10 Essential (primary) hypertension: Secondary | ICD-10-CM

## 2012-07-29 DIAGNOSIS — N259 Disorder resulting from impaired renal tubular function, unspecified: Secondary | ICD-10-CM

## 2012-07-29 DIAGNOSIS — N189 Chronic kidney disease, unspecified: Secondary | ICD-10-CM

## 2012-07-29 LAB — CBC WITH DIFFERENTIAL/PLATELET
Eosinophils Absolute: 0.2 10*3/uL (ref 0.0–0.7)
Eosinophils Relative: 3 % (ref 0–5)
Hemoglobin: 14.5 g/dL (ref 13.0–17.0)
Lymphocytes Relative: 41 % (ref 12–46)
Lymphs Abs: 2.9 10*3/uL (ref 0.7–4.0)
MCH: 27.8 pg (ref 26.0–34.0)
MCV: 81.2 fL (ref 78.0–100.0)
Monocytes Relative: 7 % (ref 3–12)
RBC: 5.21 MIL/uL (ref 4.22–5.81)
WBC: 7.1 10*3/uL (ref 4.0–10.5)

## 2012-07-29 LAB — LIPID PANEL
Cholesterol: 201 mg/dL — ABNORMAL HIGH (ref 0–200)
HDL: 36 mg/dL — ABNORMAL LOW (ref >39)
Total CHOL/HDL Ratio: 5.6 Ratio
VLDL: 43 mg/dL — ABNORMAL HIGH (ref 0–40)

## 2012-07-29 LAB — COMPREHENSIVE METABOLIC PANEL
Alkaline Phosphatase: 56 U/L (ref 39–117)
BUN: 14 mg/dL (ref 6–23)
CO2: 28 mEq/L (ref 19–32)
Creat: 1.52 mg/dL — ABNORMAL HIGH (ref 0.50–1.35)
Glucose, Bld: 83 mg/dL (ref 70–99)
Sodium: 144 mEq/L (ref 135–145)
Total Bilirubin: 0.4 mg/dL (ref 0.3–1.2)

## 2012-07-29 MED ORDER — AMLODIPINE BESYLATE 10 MG PO TABS: 10.0000 mg | ORAL_TABLET | Freq: Every day | ORAL | Status: DC

## 2012-07-29 MED ORDER — LISINOPRIL 10 MG PO TABS: 10.0000 mg | ORAL_TABLET | Freq: Every day | ORAL | Status: DC

## 2012-07-29 NOTE — Progress Notes (Signed)
Pt presents to front desk wanting refill of BP med and appt, states he guesses that because he has not come for appt since dec 2013 that his bp med is being refused so he wants to be seen for bp and get a refill. The request was sent to his [facility] md and refused by him. Pt is placed in empty 1430 slot for dr Virgina Organ.

## 2012-07-29 NOTE — Progress Notes (Signed)
Agree with plan 

## 2012-07-29 NOTE — Progress Notes (Signed)
Subjective:   Patient ID: Kyle Montgomery male   DOB: [birth year] 37 y.o.   MRN: [id number]  HPI: [name] is a 37 y.o. pleasant African American male with PMH of bipolar disorder and HTN who presents to clinic today as a walk in for refills of his BP medication.  He is present alongside his mother who claims they have missed several of his past appointments due to being busy at home with her husband's illness and appointments and he needs refills on his medications but has not been seen for a while so the refills were not being filled.  He says he has been out of his lisinopril for at least 1 week but still has a few of his norvasc which he took this morning as prescribed.  He denies any headaches, chest pain, sob, vision disturbances, light-headedness, dizziness, syncope, or near syncope.   His mother claims he needs to see his dentist again to get his teeth pulled out as advised due to poor dentition which is also making it hard for him to chew and eat his food.    Past Medical History  Diagnosis Date  . Hypertension   . Renal insufficiency     baseline Cr 2.2 - renal US with increased Echo signal, no nephrology eval, no bx  . Gynecomastia 02/07    normal pituitary by MRI  . Hyperprolactinemia 09/27/04    felt 2/2 lithium and fluphenazine  . Bipolar affective     followed by mental health   Current Outpatient Prescriptions  Medication Sig Dispense Refill  . amLODipine (NORVASC) 10 MG tablet Take 1 tablet (10 mg total) by mouth daily.  30 tablet  5  . divalproex (DEPAKOTE ER) 500 MG 24 hr tablet Take 500 mg by mouth daily.      [name] lisinopril (PRINIVIL,ZESTRIL) 10 MG tablet Take 1 tablet (10 mg total) by mouth daily.  30 tablet  5  . ziprasidone (GEODON) 80 MG capsule Take 80 mg by mouth 2 (two) times daily with a meal.       No current facility-administered medications for this visit.   Family History  Problem Relation Age of Onset  . Hyperlipidemia Mother   . Hypertension  Mother   . Atrial fibrillation Mother   . Hypertension Brother   . Hypertension Maternal Grandmother   . Cancer Maternal Grandmother    History   Social History  . Marital Status: Single    Spouse Name: N/A    Number of Children: N/A  . Years of Education: N/A   Social History Main Topics  . Smoking status: Former Smoker    Types: Cigarettes  . Smokeless tobacco: None  . Alcohol Use: No  . Drug Use: No  . Sexually Active: None   Other Topics Concern  . None   Social History Narrative  . None   Review of Systems:  Constitutional:  Denies fever, chills, diaphoresis, appetite change and fatigue.   HEENT:  Poor dentition and trouble swallowing food at times due to his teeth.  Denies congestion, sore throat, rhinorrhea, sneezing, mouth sores, neck pain   Respiratory:  Denies SOB, DOE, cough, and wheezing.   Cardiovascular:  Denies palpitations and leg swelling.   Gastrointestinal:  Denies nausea, vomiting, abdominal pain, diarrhea, constipation, blood in stool and abdominal distention.   Genitourinary:  Denies dysuria, urgency, frequency, hematuria, flank pain and difficulty urinating.   Musculoskeletal:  Denies myalgias, back pain, joint swelling, arthralgias and gait problem.  Skin:  Denies pallor, rash and wound.   Neurological:  Denies dizziness, seizures, syncope, weakness, light-headedness, numbness and headaches.    Objective:  Physical Exam: Filed Vitals:   07/29/12 1428  BP: 132/96  Pulse: 78  Temp: 97.9 F (36.6 C)  TempSrc: Oral  Height: 6' (1.829 m)  Weight: 208 lb 12.8 oz (94.711 kg)  SpO2: 95%   Vitals reviewed. General: sitting in chair, NAD HEENT: PERRL, EOMI, poor dentition Cardiac: RRR, no rubs, murmurs or gallops Pulm: clear to auscultation bilaterally, no wheezes, rales, or rhonchi Abd: soft, nontender, nondistended, BS present Ext: warm and well perfused, no pedal edema, +2DP B/L Neuro: appears to have mild MR but responds to questions  appropriately and alert and oriented X3, cranial nerves II-XII grossly intact, strength and sensation to light touch equal in bilateral upper and lower extremities  Assessment & Plan:   Discussed with Dr. Dalphine Handing HTN: refilled lisinopril and norvasc but will check labs and lipid panel

## 2012-07-29 NOTE — Assessment & Plan Note (Addendum)
Trend seems to be improving.  Last Cr check December 2013 1.64.  Continue lisinopril for now and work on BP control  -f/u cmet, cbc, lipid panel

## 2012-07-29 NOTE — Assessment & Plan Note (Addendum)
BP Readings from Last 3 Encounters:  07/29/12 132/96  01/09/12 150/98  01/07/12 99/64   Lab Results  Component Value Date   NA 143 01/09/2012   K 3.7 01/09/2012   CREATININE 1.64* 01/09/2012   Assessment: Blood pressure control: mildly elevated Progress toward BP goal:  improved Comments: has been out of his lisinopril x1 week  Plan: Medications:  continue current medications norvasc 10 and lisinopril 10 Educational resources provided: brochure;video Self management tools provided:   Other plans: recheck bmet and monitor renal function and lipid panel; return in 1 month for follow up

## 2012-07-29 NOTE — Patient Instructions (Addendum)
General Instructions: Please restart your medications for blood pressure, amlodipine and norvasc as prescribed, i have called in refills to your pharmacy  Make sure to follow up with your appointments in the future  Thank you for coming in today  Treatment Goals:  Goals (1 Years of Data) as of 07/29/12         As of Today 01/09/12 01/07/12 01/07/12 01/07/12     Blood Pressure    . Blood Pressure < 140/90  132/96 150/98 99/64 93/68  117/75      Progress Toward Treatment Goals:  Treatment Goal 07/29/2012  Blood pressure improved    Self Care Goals & Plans:  Self Care Goal 07/29/2012  Manage my medications take my medicines as prescribed  Eat healthy foods -  Be physically active -     Care Management & Community Referrals: Hypertension Hypertension is another name for high blood pressure. High blood pressure may mean that your heart needs to work harder to pump blood. Blood pressure consists of two numbers, which includes a higher number over a lower number (example: 110/72). HOME CARE   Make lifestyle changes as told by your doctor. This may include weight loss and exercise.  Take your blood pressure medicine every day.  Limit how much salt you use.  Stop smoking if you smoke.  Do not use drugs.  Talk to your doctor if you are using decongestants or birth control pills. These medicines might make blood pressure higher.  Females should not drink more than 1 alcoholic drink per day. Males should not drink more than 2 alcoholic drinks per day.  See your doctor as told. GET HELP RIGHT AWAY IF:   You have a blood pressure reading with a top number of 180 or higher.  You get a very bad headache.  You get blurred or changing vision.  You feel confused.  You feel weak, numb, or faint.  You get chest or belly (abdominal) pain.  You throw up (vomit).  You cannot breathe very well. MAKE SURE YOU:   Understand these instructions.  Will watch your  condition.  Will get help right away if you are not doing well or get worse. Document Released: 06/26/2007 Document Revised: 04/01/2011 Document Reviewed: 06/26/2007 [facility] Patient Information 2014 [place], [place].

## 2012-07-30 NOTE — Progress Notes (Signed)
Case discussed with Dr. Qureshi soon after the resident saw the patient.  We reviewed the resident's history and exam and pertinent patient test results.  I agree with the assessment, diagnosis, and plan of care documented in the resident's note. 

## 2012-08-23 ENCOUNTER — Emergency Department (HOSPITAL_COMMUNITY)
Admission: EM | Admit: 2012-08-23 | Discharge: 2012-08-23 | Disposition: A | Discharge status: Home / Self Care | Payer: Medicaid Other | Attending: Emergency Medicine | Admitting: Emergency Medicine

## 2012-08-23 ENCOUNTER — Encounter (HOSPITAL_COMMUNITY): Payer: Self-pay | Admitting: *Deleted

## 2012-08-23 DIAGNOSIS — R112 Nausea with vomiting, unspecified: Secondary | ICD-10-CM | POA: Insufficient documentation

## 2012-08-23 DIAGNOSIS — I129 Hypertensive chronic kidney disease with stage 1 through stage 4 chronic kidney disease, or unspecified chronic kidney disease: Secondary | ICD-10-CM | POA: Insufficient documentation

## 2012-08-23 DIAGNOSIS — N289 Disorder of kidney and ureter, unspecified: Secondary | ICD-10-CM | POA: Insufficient documentation

## 2012-08-23 DIAGNOSIS — R1110 Vomiting, unspecified: Secondary | ICD-10-CM

## 2012-08-23 DIAGNOSIS — F319 Bipolar disorder, unspecified: Secondary | ICD-10-CM | POA: Insufficient documentation

## 2012-08-23 DIAGNOSIS — Z79899 Other long term (current) drug therapy: Secondary | ICD-10-CM | POA: Insufficient documentation

## 2012-08-23 DIAGNOSIS — R42 Dizziness and giddiness: Secondary | ICD-10-CM

## 2012-08-23 DIAGNOSIS — Z87891 Personal history of nicotine dependence: Secondary | ICD-10-CM | POA: Insufficient documentation

## 2012-08-23 LAB — COMPREHENSIVE METABOLIC PANEL
ALT: 18 U/L (ref 0–53)
AST: 27 U/L (ref 0–37)
Albumin: 3.4 g/dL — ABNORMAL LOW (ref 3.5–5.2)
Alkaline Phosphatase: 60 U/L (ref 39–117)
Calcium: 9.3 mg/dL (ref 8.4–10.5)
Potassium: 3.5 mEq/L (ref 3.5–5.1)
Sodium: 142 mEq/L (ref 135–145)
Total Protein: 7.1 g/dL (ref 6.0–8.3)

## 2012-08-23 LAB — CBC WITH DIFFERENTIAL/PLATELET
Basophils Absolute: 0 10*3/uL (ref 0.0–0.1)
Eosinophils Absolute: 0.2 10*3/uL (ref 0.0–0.7)
Eosinophils Relative: 3 % (ref 0–5)
Lymphs Abs: 2.6 10*3/uL (ref 0.7–4.0)
MCH: 29 pg (ref 26.0–34.0)
MCV: 83.4 fL (ref 78.0–100.0)
Neutrophils Relative %: 50 % (ref 43–77)
Platelets: 202 10*3/uL (ref 150–400)
RBC: 4.76 MIL/uL (ref 4.22–5.81)
RDW: 13.1 % (ref 11.5–15.5)
WBC: 6.8 10*3/uL (ref 4.0–10.5)

## 2012-08-23 MED ORDER — SODIUM CHLORIDE 0.9 % IV BOLUS (SEPSIS)
1000.0000 mL | Freq: Once | INTRAVENOUS | Status: AC
  Administered 2012-08-23: 1000 mL via INTRAVENOUS

## 2012-08-23 NOTE — ED Notes (Signed)
C/o n/v always in the morning & intermittent dizziness & lightheadedness x 3 weeks. Denies diarrhea

## 2012-08-23 NOTE — ED Notes (Signed)
C/o once daily emesis in the morning & intermittent dizziness x 3 weeks. Eating & drinking well, denies diarrhea. Denies cold, cough, abd pain, sore throat, nasal congestion.  Pt reports, "I've been taking my medicine on time every day & I'm not hearing or seeing things anymore". Also c/o "feeling warm all over inside".

## 2012-08-23 NOTE — ED Notes (Signed)
Tolerated food & liquids well. No vomiting noted, denies nausea.

## 2012-08-23 NOTE — ED Provider Notes (Signed)
CSN: [id number]     Arrival date & time 08/23/12  1055 History     First MD Initiated Contact with Patient 08/23/12 1058     Chief Complaint  Patient presents with  . Emesis  . Dizziness   (Consider location/radiation/quality/duration/timing/severity/associated sxs/prior Treatment) HPI Comments: Patient presents to the emergency department with chief complaint of nausea, vomiting, and dizziness x3 weeks. States that he has had persistent nausea and vomiting every morning for the past 3 weeks. He denies seeing any blood in his vomit. He also reports associated dizziness/lightheadedness. He denies any chest pain, shortness of breath, diarrhea, or constipation. He states that he has several psychiatric issues, but that he has been following up appropriately, and has been doing well. He denies SI, HI, or visual/auditory hallucinations. He states that nothing makes his symptoms better or worse.  The history is provided by the patient. No language interpreter was used.    Past Medical History  Diagnosis Date  . Hypertension   . Renal insufficiency     baseline Cr 2.2 - renal US with increased Echo signal, no nephrology eval, no bx  . Gynecomastia 02/07    normal pituitary by MRI  . Hyperprolactinemia 09/27/04    felt 2/2 lithium and fluphenazine  . Bipolar affective     followed by mental health   History reviewed. No pertinent past surgical history. Family History  Problem Relation Age of Onset  . Hyperlipidemia Mother   . Hypertension Mother   . Atrial fibrillation Mother   . Hypertension Brother   . Hypertension Maternal Grandmother   . Cancer Maternal Grandmother    History  Substance Use Topics  . Smoking status: Former Smoker    Types: Cigarettes  . Smokeless tobacco: Not on file  . Alcohol Use: No    Review of Systems  All other systems reviewed and are negative.    Allergies  Review of patient's allergies indicates no known allergies.  Home Medications    Current Outpatient Rx  Name  Route  Sig  Dispense  Refill  . amLODipine (NORVASC) 10 MG tablet   Oral   Take 1 tablet (10 mg total) by mouth daily.   30 tablet   5   . divalproex (DEPAKOTE ER) 500 MG 24 hr tablet   Oral   Take 500 mg by mouth daily.         [name] lisinopril (PRINIVIL,ZESTRIL) 10 MG tablet   Oral   Take 1 tablet (10 mg total) by mouth daily.   30 tablet   5   . ziprasidone (GEODON) 80 MG capsule   Oral   Take 80 mg by mouth 2 (two) times daily with a meal.          BP 145/84  Pulse 122  Temp(Src) 98.9 F (37.2 C) (Oral)  Resp 18  Wt 197 lb (89.359 kg)  BMI 26.71 kg/m2  SpO2 99% Physical Exam  Nursing note and vitals reviewed. Constitutional: He is oriented to person, place, and time. He appears well-developed and well-nourished.  HENT:  Head: Normocephalic and atraumatic.  Mouth/Throat: No oropharyngeal exudate.  Eyes: Conjunctivae and EOM are normal. Pupils are equal, round, and reactive to light. Right eye exhibits no discharge. Left eye exhibits no discharge. No scleral icterus.  Neck: Normal range of motion. Neck supple. No JVD present.  Cardiovascular: Normal rate, regular rhythm, normal heart sounds and intact distal pulses.  Exam reveals no gallop and no friction rub.   No murmur  heard. Pulmonary/Chest: Effort normal and breath sounds normal. No respiratory distress. He has no wheezes. He has no rales. He exhibits no tenderness.  Abdominal: Soft. Bowel sounds are normal. He exhibits no distension and no mass. There is no tenderness. There is no rebound and no guarding.  No abdominal pain  Musculoskeletal: Normal range of motion. He exhibits no edema and no tenderness.  Neurological: He is alert and oriented to person, place, and time.  CN 3-12 intact  Skin: Skin is warm and dry.  Psychiatric: He has a normal mood and affect. His behavior is normal. Judgment and thought content normal.    ED Course   Procedures (including critical care  time)  Labs Reviewed  CBC WITH DIFFERENTIAL  COMPREHENSIVE METABOLIC PANEL  ED ECG REPORT  I personally interpreted this EKG   Date: 08/23/2012   Rate: 109  Rhythm: sinus tachycardia  QRS Axis: normal  Intervals: normal  ST/T Wave abnormalities: nonspecific T wave changes  Conduction Disutrbances:none  Narrative Interpretation:   Old EKG Reviewed: New tachycardia when compared to prior   Results for orders placed during the hospital encounter of 08/23/12  CBC WITH DIFFERENTIAL      Result Value Range   WBC 6.8  4.0 - 10.5 K/uL   RBC 4.76  4.22 - 5.81 MIL/uL   Hemoglobin 13.8  13.0 - 17.0 g/dL   HCT 45.4  09.8 - 11.9 %   MCV 83.4  78.0 - 100.0 fL   MCH 29.0  26.0 - 34.0 pg   MCHC 34.8  30.0 - 36.0 g/dL   RDW 14.7  82.9 - 56.2 %   Platelets 202  150 - 400 K/uL   Neutrophils Relative % 50  43 - 77 %   Neutro Abs 3.4  1.7 - 7.7 K/uL   Lymphocytes Relative 39  12 - 46 %   Lymphs Abs 2.6  0.7 - 4.0 K/uL   Monocytes Relative 8  3 - 12 %   Monocytes Absolute 0.5  0.1 - 1.0 K/uL   Eosinophils Relative 3  0 - 5 %   Eosinophils Absolute 0.2  0.0 - 0.7 K/uL   Basophils Relative 0  0 - 1 %   Basophils Absolute 0.0  0.0 - 0.1 K/uL  COMPREHENSIVE METABOLIC PANEL      Result Value Range   Sodium 142  135 - 145 mEq/L   Potassium 3.5  3.5 - 5.1 mEq/L   Chloride 107  96 - 112 mEq/L   CO2 23  19 - 32 mEq/L   Glucose, Bld 119 (*) 70 - 99 mg/dL   BUN 18  6 - 23 mg/dL   Creatinine, Ser 1.30 (*) 0.50 - 1.35 mg/dL   Calcium 9.3  8.4 - 86.5 mg/dL   Total Protein 7.1  6.0 - 8.3 g/dL   Albumin 3.4 (*) 3.5 - 5.2 g/dL   AST 27  0 - 37 U/L   ALT 18  0 - 53 U/L   Alkaline Phosphatase 60  39 - 117 U/L   Total Bilirubin 0.2 (*) 0.3 - 1.2 mg/dL   GFR calc non Af Amer 54 (*) >90 mL/min   GFR calc Af Amer 63 (*) >90 mL/min      1. Dizziness   2. Vomiting     MDM  Patient presents emergency department with chief complaint of dizziness and vomiting. States that this is an ongoing for the  past 3 weeks. Will check basic labs, EKG, and  give fluids. I suspect the patient might be dehydrated.  After getting fluids, the patient states that he is feeling much better. Labs are unremarkable. The patient has been seen by and discussed with Dr. Denton Lank, who agrees with the discharge plan. The patient is tolerating oral intake. He is stable and ready for discharge.   Roxy Horseman, PA-C 08/23/12 1523

## 2012-08-24 NOTE — ED Provider Notes (Signed)
Medical screening examination/treatment/procedure(s) were conducted as a shared visit with non-physician practitioner(s) and myself.  I personally evaluated the patient during the encounter Pt with nausea earlier, felt slightly lightheaded. abd soft nt. No pain. No headache. No cp. No fever or chills. Pt eating and drinking. Feels much improved.   Suzi Roots, MD 08/24/12 [phone]

## 2012-09-01 ENCOUNTER — Ambulatory Visit (INDEPENDENT_AMBULATORY_CARE_PROVIDER_SITE_OTHER): Payer: Medicaid Other | Admitting: Internal Medicine

## 2012-09-01 ENCOUNTER — Encounter: Payer: Self-pay | Admitting: Internal Medicine

## 2012-09-01 VITALS — BP 132/90 | HR 85 | Temp 99.1°F | Ht 72.0 in | Wt 214.6 lb

## 2012-09-01 DIAGNOSIS — N1832 Chronic kidney disease, stage 3b: Secondary | ICD-10-CM | POA: Insufficient documentation

## 2012-09-01 DIAGNOSIS — I129 Hypertensive chronic kidney disease with stage 1 through stage 4 chronic kidney disease, or unspecified chronic kidney disease: Secondary | ICD-10-CM

## 2012-09-01 DIAGNOSIS — I10 Essential (primary) hypertension: Secondary | ICD-10-CM

## 2012-09-01 LAB — BASIC METABOLIC PANEL WITH GFR
BUN: 17 mg/dL (ref 6–23)
Chloride: 105 mEq/L (ref 96–112)
GFR, Est African American: 61 mL/min
Glucose, Bld: 86 mg/dL (ref 70–99)
Potassium: 3.8 mEq/L (ref 3.5–5.3)

## 2012-09-01 NOTE — Assessment & Plan Note (Signed)
BP Readings from Last 3 Encounters:  09/01/12 132/90  08/23/12 141/88  07/29/12 132/96    Lab Results  Component Value Date   NA 142 08/23/2012   K 3.5 08/23/2012   CREATININE 1.59* 08/23/2012    Assessment: Blood pressure control: controlled Progress toward BP goal:  at goal Comments:  Plan: Medications:  continue current medications Educational resources provided:   Self management tools provided:   Other plans: will continue amlodipine 10 mg daily and lisinopril 10 mg daily. We'll check BMP.

## 2012-09-01 NOTE — Patient Instructions (Signed)
1. Please take all medications as prescribed.  2. If you have worsening of your symptoms or new symptoms arise, please call the clinic [phone]), or go to the ER immediately if symptoms are severe.  You have done great job in taking all your medications. I appreciate it very much. Please continue doing that.

## 2012-09-01 NOTE — Progress Notes (Signed)
Patient ID: PJ ZEHNER, male   DOB: 1975/02/25, 37 y.o.   MRN: [id number] Subjective:   Patient ID: ROGERIO BOUTELLE male   DOB: 1975/02/04 37 y.o.   MRN: [id number]  CC:  Follow up visit.  HPI:  Mr.Sir L Sheriff is a 37 y.o. man  with past medical history as outlined below, who presents for a followup visit today  1. HTN: Patient was recently seen in clinic on 07/29/12. He was restarted his blood pressure medication, lisinopril which he was not taking at home. Today he comes back for followup of his blood pressure. He does not have chest pain, shortness of breath, leg edema, palpitation. He is currently taking amlodipine 10 mg daily and lisinopril 10 mg daily. His blood pressure is 132/90.  2. 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 His kidney function seems to be stable. His creatinine was 1.52 on 07/30/11 and 1.59 on 08/23/12.  3. Bipolar disorder: He is stable. Currently patient is taking Depakote and Geodon at home.   ROS:  Denies fever, chills, fatigue, headaches,  cough, chest pain, SOB,  abdominal pain,diarrhea, constipation, dysuria, urgency, frequency, hematuria, joint pain or leg swelling.   Past Medical History  Diagnosis Date  . Hypertension   . Renal insufficiency     baseline Cr 2.2 - renal US with increased Echo signal, no nephrology eval, no bx  . Gynecomastia 02/07    normal pituitary by MRI  . Hyperprolactinemia 09/27/04    felt 2/2 lithium and fluphenazine  . Bipolar affective     followed by mental health   Current Outpatient Prescriptions  Medication Sig Dispense Refill  . amLODipine (NORVASC) 10 MG tablet Take 1 tablet (10 mg total) by mouth daily.  30 tablet  5  . divalproex (DEPAKOTE ER) 500 MG 24 hr tablet Take 500 mg by mouth daily.      [name] lisinopril (PRINIVIL,ZESTRIL) 10 MG tablet Take 1 tablet (10 mg total) by mouth daily.  30 tablet  5  . ziprasidone (GEODON)  80 MG capsule Take 80 mg by mouth 2 (two) times daily with a meal.       No current facility-administered medications for this visit.   Family History  Problem Relation Age of Onset  . Hyperlipidemia Mother   . Hypertension Mother   . Atrial fibrillation Mother   . Hypertension Brother   . Hypertension Maternal Grandmother   . Cancer Maternal Grandmother    History   Social History  . Marital Status: Single    Spouse Name: N/A    Number of Children: N/A  . Years of Education: N/A   Social History Main Topics  . Smoking status: Former Smoker    Types: Cigarettes  . Smokeless tobacco: None  . Alcohol Use: No  . Drug Use: No  . Sexually Active: None   Other Topics Concern  . None   Social History Narrative  . None    Review of Systems: Full 14-point review of systems otherwise negative. See HPI.   Objective:  Physical Exam: Filed Vitals:   09/01/12 1401  BP: 132/90  Pulse: 85  Temp: 99.1 F (37.3 C)  TempSrc: Oral  Height: 6' (1.829 m)  Weight: 214 lb 9.6 oz (97.342 kg)  SpO2: 97%   Constitutional: Vital signs reviewed.  Patient is a well-developed and well-nourished, in no acute distress and cooperative with exam.  HEENT:  Head: Normocephalic and atraumatic Ear: TM normal bilaterally Mouth: no erythema or exudates, MMM Eyes: PERRL, EOMI, conjunctivae normal, No scleral icterus.  Neck: Supple, Trachea midline normal ROM, No JVD Cardiovascular: RRR, S1 normal, S2 normal, no MRG, pulses symmetric and intact bilaterally Pulmonary/Chest: CTAB, no wheezes, rales, or rhonchi Abdominal: Soft. Non-tender, non-distended, bowel sounds are normal, no masses, organomegaly, or guarding present.  GU: no CVA tenderness Musculoskeletal: No joint deformities, erythema, or stiffness, ROM full and non-tender Hematology: no cervical, inginal, or axillary adenopathy.  Neurological: A&O x3, Strength is normal and symmetric bilaterally, cranial nerve II-XII are grossly intact,  no focal motor deficit, sensory intact to light touch bilaterally.  Skin: Warm, dry and intact. No rash, cyanosis, or clubbing.  Psychiatric: Normal mood and affect. speech and behavior is normal. Judgment and thought content normal. Cognition and memory are normal.   Assessment & Plan:

## 2012-09-01 NOTE — Assessment & Plan Note (Signed)
Kidney function is stable. Creatinine was 1.52 on 07/30/11 and 1.59 on 08/23/12. Will repeat BMP and check Micro/Albumin ratio.

## 2012-09-02 LAB — MICROALBUMIN / CREATININE URINE RATIO: Microalb Creat Ratio: 397 mg/g — ABNORMAL HIGH (ref 0.0–30.0)

## 2012-09-02 NOTE — Progress Notes (Signed)
Case discussed with Dr. Niu soon after the resident saw the patient.  We reviewed the resident's history and exam and pertinent patient test results.  I agree with the assessment, diagnosis, and plan of care documented in the resident's note. 

## 2012-09-28 ENCOUNTER — Emergency Department (HOSPITAL_COMMUNITY)
Admission: EM | Admit: 2012-09-28 | Discharge: 2012-09-28 | Disposition: A | Discharge status: Home / Self Care | Payer: Medicaid Other | Attending: Emergency Medicine | Admitting: Emergency Medicine

## 2012-09-28 ENCOUNTER — Encounter (HOSPITAL_COMMUNITY): Payer: Self-pay | Admitting: Nurse Practitioner

## 2012-09-28 DIAGNOSIS — Z862 Personal history of diseases of the blood and blood-forming organs and certain disorders involving the immune mechanism: Secondary | ICD-10-CM | POA: Insufficient documentation

## 2012-09-28 DIAGNOSIS — Z87448 Personal history of other diseases of urinary system: Secondary | ICD-10-CM | POA: Insufficient documentation

## 2012-09-28 DIAGNOSIS — Z79899 Other long term (current) drug therapy: Secondary | ICD-10-CM | POA: Insufficient documentation

## 2012-09-28 DIAGNOSIS — F319 Bipolar disorder, unspecified: Secondary | ICD-10-CM | POA: Insufficient documentation

## 2012-09-28 DIAGNOSIS — Z87891 Personal history of nicotine dependence: Secondary | ICD-10-CM | POA: Insufficient documentation

## 2012-09-28 DIAGNOSIS — I10 Essential (primary) hypertension: Secondary | ICD-10-CM | POA: Insufficient documentation

## 2012-09-28 DIAGNOSIS — Z8639 Personal history of other endocrine, nutritional and metabolic disease: Secondary | ICD-10-CM | POA: Insufficient documentation

## 2012-09-28 MED ORDER — LISINOPRIL 10 MG PO TABS: 10.0000 mg | ORAL_TABLET | Freq: Every day | ORAL | Status: DC

## 2012-09-28 MED ORDER — AMLODIPINE BESYLATE 10 MG PO TABS: 10.0000 mg | ORAL_TABLET | Freq: Every day | ORAL | Status: DC

## 2012-09-28 MED ORDER — DIVALPROEX SODIUM ER 500 MG PO TB24: 500.0000 mg | ORAL_TABLET | Freq: Every day | ORAL | Status: DC

## 2012-09-28 MED ORDER — ZIPRASIDONE HCL 80 MG PO CAPS: 80.0000 mg | ORAL_CAPSULE | Freq: Two times a day (BID) | ORAL | Status: DC

## 2012-09-28 NOTE — ED Notes (Addendum)
Pt was a pt of [facility] and has been calling for medication refill this week. No one would return his call so mom went to facility today and 'there was a padlock on the door." pt requesting refill on all of pts psych meds. Pt a&ox4, states he feels fine and has no complaint. Pt last dose of meds was yesterday

## 2012-09-28 NOTE — ED Provider Notes (Signed)
CSN: [id number]     Arrival date & time 09/28/12  1856 History   First MD Initiated Contact with Patient 09/28/12 1931     Chief Complaint  Patient presents with  . Medication Refill   (Consider location/radiation/quality/duration/timing/severity/associated sxs/prior Treatment) HPI Patient states he went to go get his medications refilled at his psychiatrist's office today and and the door was padlocked. [facility]. Patient denies any new psychiatric complaints or physical complaints. Past Medical History  Diagnosis Date  . Hypertension   . Renal insufficiency     baseline Cr 2.2 - renal US with increased Echo signal, no nephrology eval, no bx  . Gynecomastia 02/07    normal pituitary by MRI  . Hyperprolactinemia 09/27/04    felt 2/2 lithium and fluphenazine  . Bipolar affective     followed by mental health   History reviewed. No pertinent past surgical history. Family History  Problem Relation Age of Onset  . Hyperlipidemia Mother   . Hypertension Mother   . Atrial fibrillation Mother   . Hypertension Brother   . Hypertension Maternal Grandmother   . Cancer Maternal Grandmother    History  Substance Use Topics  . Smoking status: Former Smoker    Types: Cigarettes  . Smokeless tobacco: Not on file  . Alcohol Use: No    Review of Systems  Constitutional: Negative for fever and chills.  Respiratory: Negative for chest tightness and shortness of breath.   Cardiovascular: Negative for chest pain.  Neurological: Negative for dizziness, weakness, light-headedness, numbness and headaches.  Psychiatric/Behavioral: Negative for suicidal ideas and hallucinations. The patient is not nervous/anxious.   All other systems reviewed and are negative.    Allergies  Review of patient's allergies indicates no known allergies.  Home Medications   Current Outpatient Rx  Name  Route  Sig  Dispense  Refill  . amLODipine (NORVASC) 10 MG  tablet   Oral   Take 1 tablet (10 mg total) by mouth daily.   30 tablet   5   . divalproex (DEPAKOTE ER) 500 MG 24 hr tablet   Oral   Take 500 mg by mouth daily.         [name] lisinopril (PRINIVIL,ZESTRIL) 10 MG tablet   Oral   Take 1 tablet (10 mg total) by mouth daily.   30 tablet   5   . ziprasidone (GEODON) 80 MG capsule   Oral   Take 80 mg by mouth 2 (two) times daily with a meal.          BP 139/96  Pulse 110  Temp(Src) 98.7 F (37.1 C) (Oral)  Resp 15  Ht 5\' 10"  (1.778 m)  Wt 197 lb (89.359 kg)  BMI 28.27 kg/m2  SpO2 98% Physical Exam  Nursing note and vitals reviewed. Constitutional: He is oriented to person, place, and time. He appears well-developed and well-nourished. No distress.  HENT:  Head: Normocephalic and atraumatic.  Mouth/Throat: Oropharynx is clear and moist.  Eyes: EOM are normal. Pupils are equal, round, and reactive to light.  Neck: Normal range of motion. Neck supple.  Cardiovascular: Regular rhythm.   Pulmonary/Chest: Effort normal.  Abdominal: Soft. Bowel sounds are normal.  Musculoskeletal: Normal range of motion. He exhibits no edema and no tenderness.  Neurological: He is alert and oriented to person, place, and time.  Moves all extremities without deficit.  Skin: Skin is warm and dry. No rash noted. No erythema.  Psychiatric:  He has a normal mood and affect. His behavior is normal.    ED Course  Procedures (including critical care time) Labs Review Labs Reviewed - No data to display Imaging Review No results found.  MDM  No diagnosis found. The refill medication and advised to follow up with his primary doctor for referral to another psychiatrist. Her precautions given to    Loren Racer, MD 09/28/12 2000

## 2012-09-28 NOTE — ED Notes (Addendum)
Pt requesting med refills of his geodon, depakote, lisionpril, and norvasc. Pt also requesting a resource guide to find a new psychiatrist in the area.

## 2012-11-04 ENCOUNTER — Other Ambulatory Visit: Payer: Self-pay | Admitting: *Deleted

## 2012-11-04 DIAGNOSIS — I10 Essential (primary) hypertension: Secondary | ICD-10-CM

## 2012-11-05 ENCOUNTER — Other Ambulatory Visit: Payer: Self-pay | Admitting: *Deleted

## 2012-11-05 DIAGNOSIS — I10 Essential (primary) hypertension: Secondary | ICD-10-CM

## 2012-11-05 DIAGNOSIS — R569 Unspecified convulsions: Secondary | ICD-10-CM

## 2012-11-05 MED ORDER — AMLODIPINE BESYLATE 10 MG PO TABS: 10.0000 mg | ORAL_TABLET | Freq: Every day | ORAL | Status: DC

## 2012-11-05 MED ORDER — LISINOPRIL 10 MG PO TABS: 10.0000 mg | ORAL_TABLET | Freq: Every day | ORAL | Status: DC

## 2012-11-05 MED ORDER — DIVALPROEX SODIUM ER 500 MG PO TB24: 500.0000 mg | ORAL_TABLET | Freq: Every day | ORAL | Status: DC

## 2012-11-10 NOTE — Telephone Encounter (Signed)
Called to pharm 

## 2012-12-01 ENCOUNTER — Ambulatory Visit (INDEPENDENT_AMBULATORY_CARE_PROVIDER_SITE_OTHER): Payer: Medicaid Other | Admitting: Internal Medicine

## 2012-12-01 ENCOUNTER — Encounter: Payer: Self-pay | Admitting: Internal Medicine

## 2012-12-01 VITALS — BP 114/79 | HR 70 | Temp 97.6°F | Ht 70.0 in | Wt 214.5 lb

## 2012-12-01 DIAGNOSIS — I10 Essential (primary) hypertension: Secondary | ICD-10-CM

## 2012-12-01 DIAGNOSIS — R569 Unspecified convulsions: Secondary | ICD-10-CM

## 2012-12-01 DIAGNOSIS — N183 Chronic kidney disease, stage 3 unspecified: Secondary | ICD-10-CM

## 2012-12-01 DIAGNOSIS — Z23 Encounter for immunization: Secondary | ICD-10-CM

## 2012-12-01 DIAGNOSIS — Z0000 Encounter for general adult medical examination without abnormal findings: Secondary | ICD-10-CM

## 2012-12-01 DIAGNOSIS — F319 Bipolar disorder, unspecified: Secondary | ICD-10-CM

## 2012-12-01 LAB — BASIC METABOLIC PANEL WITH GFR
CO2: 27 mEq/L (ref 19–32)
Chloride: 102 mEq/L (ref 96–112)
Sodium: 142 mEq/L (ref 135–145)

## 2012-12-01 MED ORDER — LISINOPRIL 10 MG PO TABS: 10.0000 mg | ORAL_TABLET | Freq: Every day | ORAL | Status: DC

## 2012-12-01 MED ORDER — ZIPRASIDONE HCL 80 MG PO CAPS: 80.0000 mg | ORAL_CAPSULE | Freq: Two times a day (BID) | ORAL | Status: DC

## 2012-12-01 MED ORDER — AMLODIPINE BESYLATE 10 MG PO TABS: 10.0000 mg | ORAL_TABLET | Freq: Every day | ORAL | Status: DC

## 2012-12-01 MED ORDER — DIVALPROEX SODIUM ER 500 MG PO TB24: 500.0000 mg | ORAL_TABLET | Freq: Every day | ORAL | Status: DC

## 2012-12-01 NOTE — Patient Instructions (Signed)
1. You have done great job in taking all your medications. I appreciate it very much. Please continue doing that. 2. Please take all medications as prescribed.  3. If you have worsening of your symptoms or new symptoms arise, please call the clinic ([phone]), or go to the ER immediately if symptoms are severe.     

## 2012-12-01 NOTE — Progress Notes (Signed)
Patient ID: Kyle Montgomery, male   DOB: January 23, 1975, 37 y.o.   MRN: [id number] Subjective:   Patient ID: Kyle Montgomery male   DOB: 07-11-1975 37 y.o.   MRN: [id number]  CC:   Follow up visit.    HPI:  Kyle Montgomery is a 37 y.o. man with past medical history as outlined below, who presents for a followup visit today  1. HTN: Patient is currently taking lisinopril 10 mg daily and amlodipine 10 mg daily. Her blood pressure is 114/79 today. He does not have chest pain, shortness of breath, leg edema, palpitation.   2. 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 His kidney function seems to be stable. His creatinine was 1.52 on 07/30/11-->1.59 on 08/23/12-->1.64 on 09/01/12.   3. Bipolar disorder: He is stable. Currently patient is taking Depakote and Geodon at home.   ROS:  Denies fever, chills, fatigue, headaches,  cough, chest pain, SOB,  abdominal pain,diarrhea, constipation, dysuria, urgency, frequency, hematuria, joint pain or leg swelling   Past Medical History  Diagnosis Date  . Hypertension   . Renal insufficiency     baseline Cr 2.2 - renal US with increased Echo signal, no nephrology eval, no bx  . Gynecomastia 02/07    normal pituitary by MRI  . Hyperprolactinemia 09/27/04    felt 2/2 lithium and fluphenazine  . Bipolar affective     followed by mental health   Current Outpatient Prescriptions  Medication Sig Dispense Refill  . amLODipine (NORVASC) 10 MG tablet Take 1 tablet (10 mg total) by mouth daily.  90 tablet  3  . divalproex (DEPAKOTE ER) 500 MG 24 hr tablet Take 1 tablet (500 mg total) by mouth daily.  90 tablet  3  . lisinopril (PRINIVIL,ZESTRIL) 10 MG tablet Take 1 tablet (10 mg total) by mouth daily.  30 tablet  5  . ziprasidone (GEODON) 80 MG capsule Take 1 capsule (80 mg total) by mouth 2 (two) times daily with a meal.  60 capsule  0   No current facility-administered  medications for this visit.   Family History  Problem Relation Age of Onset  . Hyperlipidemia Mother   . Hypertension Mother   . Atrial fibrillation Mother   . Hypertension Brother   . Hypertension Maternal Grandmother   . Cancer Maternal Grandmother    History   Social History  . Marital Status: Single    Spouse Name: N/A    Number of Children: N/A  . Years of Education: N/A   Social History Main Topics  . Smoking status: Former Smoker    Types: Cigarettes  . Smokeless tobacco: None  . Alcohol Use: No  . Drug Use: No  . Sexual Activity: None   Other Topics Concern  . None   Social History Narrative  . None    Review of Systems: Full 14-point review of systems otherwise negative. See HPI.   Objective:  Physical Exam: Filed Vitals:   12/01/12 1523  BP: 114/79  Pulse: 70  Temp: 97.6 F (36.4 C)  TempSrc: Oral  Height: 5\' 10"  (1.778 m)  Weight: 214 lb 8 oz (97.297 kg)  SpO2: 98%   Constitutional: Vital signs reviewed.  Patient is a well-developed and well-nourished, in no acute distress and cooperative with exam.   HEENT:  Head: Normocephalic and atraumatic Mouth: no erythema or exudates, MMM Eyes: PERRL, EOMI, conjunctivae normal, No  scleral icterus.  Neck: Supple, Trachea midline normal ROM, No JVD Cardiovascular: RRR, S1 normal, S2 normal, no MRG, pulses symmetric and intact bilaterally Pulmonary/Chest: CTAB, no wheezes, rales, or rhonchi Abdominal: Soft. Non-tender, non-distended, bowel sounds are normal, no masses, organomegaly, or guarding present.   GU: no CVA tenderness Musculoskeletal: No joint deformities, erythema, or stiffness, ROM full and non-tender Hematology: no cervical, inginal, or axillary adenopathy.  Neurological: A&O x3, Strength is normal and symmetric bilaterally, cranial nerve II-XII are grossly intact, no focal motor deficit, sensory intact to light touch bilaterally.  Skin: Warm, dry and intact. No rash, cyanosis, or clubbing.   Psychiatric: Normal mood and affect. speech and behavior is normal. Judgment and thought content normal. Cognition and memory are normal.    Assessment & Plan:

## 2012-12-01 NOTE — Assessment & Plan Note (Signed)
He is stable. Currently patient is taking Depakote and Geodon at home, will continue current regimen.

## 2012-12-01 NOTE — Assessment & Plan Note (Signed)
BP Readings from Last 3 Encounters:  12/01/12 114/79  09/28/12 139/96  09/01/12 132/90    Lab Results  Component Value Date   NA 141 09/01/2012   K 3.8 09/01/2012   CREATININE 1.64* 09/01/2012    Assessment: Blood pressure control: controlled Progress toward BP goal:  at goal Comments:   Plan: Medications:  continue current medications Educational resources provided: brochure;handout;video Self management tools provided:   Other plans: Blood pressure is well controlled. We'll continue current regimen, amlodipine 10 mg and lisinopril mg daily.

## 2012-12-01 NOTE — Assessment & Plan Note (Signed)
His kidney function seems to be stable. His creatinine was 1.52 on 07/30/11-->1.59 on 08/23/12-->1.64 on 09/01/12.   -will check BMP today.

## 2012-12-01 NOTE — Assessment & Plan Note (Signed)
-  will give flu shot today.  

## 2012-12-04 NOTE — Progress Notes (Signed)
Case discussed with Dr. Niu soon after the resident saw the patient.  We reviewed the resident's history and exam and pertinent patient test results.  I agree with the assessment, diagnosis, and plan of care documented in the resident's note. 

## 2013-06-01 ENCOUNTER — Encounter: Payer: Medicaid Other | Admitting: Internal Medicine

## 2013-06-02 ENCOUNTER — Encounter: Payer: Medicaid Other | Admitting: Internal Medicine

## 2013-11-23 ENCOUNTER — Encounter: Payer: Self-pay | Admitting: Internal Medicine

## 2013-11-23 ENCOUNTER — Ambulatory Visit (INDEPENDENT_AMBULATORY_CARE_PROVIDER_SITE_OTHER): Payer: Medicaid Other | Admitting: Internal Medicine

## 2013-11-23 VITALS — BP 127/87 | HR 76 | Temp 97.9°F | Ht 70.0 in | Wt 231.8 lb

## 2013-11-23 DIAGNOSIS — F319 Bipolar disorder, unspecified: Secondary | ICD-10-CM

## 2013-11-23 DIAGNOSIS — R569 Unspecified convulsions: Secondary | ICD-10-CM

## 2013-11-23 DIAGNOSIS — N183 Chronic kidney disease, stage 3 unspecified: Secondary | ICD-10-CM

## 2013-11-23 DIAGNOSIS — E785 Hyperlipidemia, unspecified: Secondary | ICD-10-CM

## 2013-11-23 DIAGNOSIS — Z0000 Encounter for general adult medical examination without abnormal findings: Secondary | ICD-10-CM

## 2013-11-23 DIAGNOSIS — I10 Essential (primary) hypertension: Secondary | ICD-10-CM

## 2013-11-23 LAB — CBC WITH DIFFERENTIAL/PLATELET
BASOS ABS: 0 10*3/uL (ref 0.0–0.1)
BASOS PCT: 0 % (ref 0–1)
EOS ABS: 0.1 10*3/uL (ref 0.0–0.7)
Eosinophils Relative: 2 % (ref 0–5)
HCT: 43.3 % (ref 39.0–52.0)
Hemoglobin: 14.6 g/dL (ref 13.0–17.0)
Lymphocytes Relative: 35 % (ref 12–46)
Lymphs Abs: 2.6 10*3/uL (ref 0.7–4.0)
MCH: 28.1 pg (ref 26.0–34.0)
MCHC: 33.7 g/dL (ref 30.0–36.0)
MCV: 83.4 fL (ref 78.0–100.0)
MONOS PCT: 6 % (ref 3–12)
Monocytes Absolute: 0.4 10*3/uL (ref 0.1–1.0)
NEUTROS ABS: 4.2 10*3/uL (ref 1.7–7.7)
NEUTROS PCT: 57 % (ref 43–77)
PLATELETS: 265 10*3/uL (ref 150–400)
RBC: 5.19 MIL/uL (ref 4.22–5.81)
RDW: 13.7 % (ref 11.5–15.5)
WBC: 7.4 10*3/uL (ref 4.0–10.5)

## 2013-11-23 MED ORDER — ZIPRASIDONE HCL 80 MG PO CAPS: 80.0000 mg | ORAL_CAPSULE | Freq: Two times a day (BID) | ORAL | Status: DC

## 2013-11-23 MED ORDER — DIVALPROEX SODIUM ER 500 MG PO TB24: 500.0000 mg | ORAL_TABLET | Freq: Every day | ORAL | Status: DC

## 2013-11-23 MED ORDER — AMLODIPINE BESYLATE 10 MG PO TABS: 10.0000 mg | ORAL_TABLET | Freq: Every day | ORAL | Status: DC

## 2013-11-23 MED ORDER — LISINOPRIL 10 MG PO TABS: 10.0000 mg | ORAL_TABLET | Freq: Every day | ORAL | Status: DC

## 2013-11-23 NOTE — Assessment & Plan Note (Signed)
BP Readings from Last 3 Encounters:  11/23/13 127/87  12/01/12 114/79  09/28/12 139/96    Lab Results  Component Value Date   NA 142 12/01/2012   K 3.8 12/01/2012   CREATININE 1.84* 12/01/2012    Assessment: Blood pressure control: controlled Progress toward BP goal:  at goal Comments:   Plan: Medications:  continue current medications Educational resources provided:   Self management tools provided:   Other plans:  BP well controlled. Continue current regimen of Lisinopril 10 mg daily and Amlodipine 10 mg daily.

## 2013-11-23 NOTE — Assessment & Plan Note (Signed)
Patient refused flu shot today. Explained to patient if he changes his mind he can always get the flu shot at [facility], [facility], or come back to clinic. He understands.

## 2013-11-23 NOTE — Assessment & Plan Note (Signed)
Mood stable per patient and his mother who accompanied him to the visit. - Continue Depakote 500 mg daily and Ziprasidone 80 mg BID

## 2013-11-23 NOTE — Patient Instructions (Signed)
It was a pleasure meeting you today, [name].  Please continue taking your Amlodipine and Lisinopril for your blood pressure.  You will have some blood work today and I will call you with your results.

## 2013-11-23 NOTE — Assessment & Plan Note (Signed)
Cr slightly elevated at last visit in Nov 2014 at 1.84. Baseline ~1.5-1.6. - Check bmet today

## 2013-11-23 NOTE — Progress Notes (Signed)
   Subjective:    Patient ID: Kyle Montgomery, male    DOB: 06-12-1975, 38 y.o.   MRN: [id number]  HPI Kyle Montgomery is a 38yo man w/ PMHx of HTN, CKD Stage III, and bipolar disorder who presents today for the following:  1. HTN: BP today 127/87. He takes Lisinopril 10 mg daily and Amlodipine 10 mg daily.   2. CKD Stage III: Last bmet in 12/01/12 shows Cr 1.84, GFR 53. Baseline appears to be around 1.5-1.6. Patient denies any difficulty urinating or pain with urination.   3. Bipolar Disorder: Patient's mother accompanied to the visit. Both the patient and his mother state his mood has been stable and well controlled on Depakote 500 mg daily and Ziprasidone 80 mg BID.     Review of Systems General: Denies fever, chills, night sweats, changes in weight, changes in appetite HEENT: Denies headaches, ear pain, changes in vision, rhinorrhea, sore throat CV: Denies CP, palpitations, SOB, orthopnea Pulm: Denies SOB, cough, wheezing GI: Denies abdominal pain, nausea, vomiting, diarrhea, constipation, melena, hematochezia GU: Denies hematuria, frequency Msk: Denies muscle cramps, joint pains Neuro: Denies weakness, numbness, tingling Skin: Denies rashes, bruising    Objective:   Physical Exam General: alert, obese, sitting up in chair, NAD HEENT: [place]/AT, EOMI, PERRL, sclera anicteric, pharynx non-erythematous, mucus membranes moist Neck: supple, no JVD, no lymphadenopathy CV: RRR, normal S1/S2, no m/g/r Pulm: CTA bilaterally, breaths non-labored, no wheezing Abd: BS+, soft, obese, non-tender Ext: warm, no edema, moves all Neuro: alert and oriented x 3, CNs II-XII intact, strength 5/5 in upper and lower extremities bilaterally       Assessment & Plan:

## 2013-11-24 LAB — BASIC METABOLIC PANEL
BUN: 18 mg/dL (ref 6–23)
CHLORIDE: 104 meq/L (ref 96–112)
CO2: 25 meq/L (ref 19–32)
CREATININE: 1.78 mg/dL — AB (ref 0.50–1.35)
Calcium: 9.6 mg/dL (ref 8.4–10.5)
Glucose, Bld: 94 mg/dL (ref 70–99)
POTASSIUM: 4 meq/L (ref 3.5–5.3)
Sodium: 141 mEq/L (ref 135–145)

## 2013-11-24 LAB — LIPID PANEL
CHOLESTEROL: 233 mg/dL — AB (ref 0–200)
HDL: 34 mg/dL — ABNORMAL LOW (ref >39)
LDL Cholesterol: 145 mg/dL — ABNORMAL HIGH (ref 0–99)
TRIGLYCERIDES: 272 mg/dL — AB (ref <150)
Total CHOL/HDL Ratio: 6.9 Ratio
VLDL: 54 mg/dL — AB (ref 0–40)

## 2013-11-28 NOTE — Progress Notes (Signed)
Internal Medicine Clinic Attending  I saw and evaluated the patient.  I personally confirmed the key portions of the history and exam documented by Dr. Rivet and I reviewed pertinent patient test results.  The assessment, diagnosis, and plan were formulated together and I agree with the documentation in the resident's note. 

## 2013-11-30 ENCOUNTER — Telehealth: Payer: Self-pay | Admitting: Internal Medicine

## 2013-11-30 NOTE — Telephone Encounter (Signed)
Patient made aware of lab results. Will not make any medication changes at this time.

## 2014-02-18 ENCOUNTER — Other Ambulatory Visit: Payer: Self-pay | Admitting: Internal Medicine

## 2014-09-06 ENCOUNTER — Encounter: Payer: Self-pay | Admitting: Internal Medicine

## 2014-09-06 ENCOUNTER — Ambulatory Visit (INDEPENDENT_AMBULATORY_CARE_PROVIDER_SITE_OTHER): Payer: Medicaid Other | Admitting: Internal Medicine

## 2014-09-06 VITALS — BP 143/92 | HR 87 | Temp 97.3°F | Wt 235.7 lb

## 2014-09-06 DIAGNOSIS — Z0000 Encounter for general adult medical examination without abnormal findings: Secondary | ICD-10-CM

## 2014-09-06 DIAGNOSIS — F319 Bipolar disorder, unspecified: Secondary | ICD-10-CM

## 2014-09-06 DIAGNOSIS — E785 Hyperlipidemia, unspecified: Secondary | ICD-10-CM | POA: Insufficient documentation

## 2014-09-06 DIAGNOSIS — R569 Unspecified convulsions: Secondary | ICD-10-CM

## 2014-09-06 DIAGNOSIS — N183 Chronic kidney disease, stage 3 unspecified: Secondary | ICD-10-CM

## 2014-09-06 DIAGNOSIS — I10 Essential (primary) hypertension: Secondary | ICD-10-CM

## 2014-09-06 MED ORDER — DIVALPROEX SODIUM ER 500 MG PO TB24: 500.0000 mg | ORAL_TABLET | Freq: Every day | ORAL | Status: DC

## 2014-09-06 MED ORDER — ZIPRASIDONE HCL 80 MG PO CAPS: 80.0000 mg | ORAL_CAPSULE | Freq: Two times a day (BID) | ORAL | Status: DC

## 2014-09-06 MED ORDER — LISINOPRIL 10 MG PO TABS: 10.0000 mg | ORAL_TABLET | Freq: Every day | ORAL | Status: DC

## 2014-09-06 MED ORDER — AMLODIPINE BESYLATE 10 MG PO TABS: 10.0000 mg | ORAL_TABLET | Freq: Every day | ORAL | Status: DC

## 2014-09-06 NOTE — Progress Notes (Signed)
   Subjective:    Patient ID: [name], male    DOB: 25-Feb-1975, 39 y.o.   MRN: [id number]  HPI Mr. Tolliver is a 39yo man with PMHx of HTN, CKD Stage III, and bipolar disorder who presents today for a routine follow up visit.  Please refer to A&P documentation.   Review of Systems General: Denies fever, chills, night sweats, changes in weight, changes in appetite HEENT: Denies headaches, ear pain, changes in vision, rhinorrhea, sore throat CV: Denies CP, palpitations, SOB, orthopnea Pulm: Denies SOB, cough, wheezing GI: Denies abdominal pain, nausea, vomiting, diarrhea, constipation, melena, hematochezia GU: Denies dysuria, hematuria, frequency Msk: Denies muscle cramps, joint pains Neuro: Denies weakness, numbness, tingling Skin: Denies rashes, bruising    Objective:   Physical Exam General: alert, sitting up in chair, pleasant, NAD HEENT: [place]/AT, EOMI, sclera anicteric, poor dentition, mucus membranes moist CV: RRR, no m/g/r Pulm: CTA bilaterally, breaths non-labored Abd: BS+, soft, obese, non-tender Ext: warm, trace pitting edema bilaterally Neuro: alert and oriented x 3, no focal deficits    Assessment & Plan:  Refer to A&P documentation.

## 2014-09-06 NOTE — Patient Instructions (Signed)
-   Cut down on your salt intake! Try to avoid eating fried foods. Eat more fruits and vegetables. - Drink plenty of water - Come back to get flu shot next month - Blood work today  General Instructions:   Please bring your medicines with you each time you come to clinic.  Medicines may include prescription medications, over-the-counter medications, herbal remedies, eye drops, vitamins, or other pills.   Progress Toward Treatment Goals:  Treatment Goal 11/23/2013  Blood pressure at goal    Self Care Goals & Plans:  Self Care Goal 09/06/2014  Manage my medications take my medicines as prescribed; bring my medications to every visit  Monitor my health keep track of my blood pressure; bring my blood pressure log to each visit  Eat healthy foods eat foods that are low in salt; eat baked foods instead of fried foods  Be physically active find an activity I enjoy    No flowsheet data found.   Care Management & Community Referrals:  Referral 11/23/2013  Referrals made for care management support none needed

## 2014-09-06 NOTE — Progress Notes (Signed)
Case discussed with Dr. Rivet soon after the resident saw the patient.  We reviewed the resident's history and exam and pertinent patient test results.  I agree with the assessment, diagnosis and plan of care documented in the resident's note. 

## 2014-09-06 NOTE — Assessment & Plan Note (Signed)
Cr has been steadily trending up over past 2 years: 1.59>1.64>1.84>1.78. Patient denies any changes in urination.  - Check bmet today to assess kidney function

## 2014-09-06 NOTE — Assessment & Plan Note (Signed)
BP Readings from Last 3 Encounters:  09/06/14 143/92  11/23/13 127/87  12/01/12 114/79    Lab Results  Component Value Date   NA 141 11/23/2013   K 4.0 11/23/2013   CREATININE 1.78* 11/23/2013    Assessment: Blood pressure control: mildly elevated Comments: BP slightly elevated today. Patient admits to eating a lot of salt yesterday.   Plan: Medications:  Continue Amlodipine 10 mg daily and Lisinopril 10 mg daily  Educational resources provided: brochure Self management tools provided: home blood pressure logbook Other plans:  - Recommended to cut down on salt intake and to drink plenty of water - If BP elevated at next visit can consider increasing Lisinopril

## 2014-09-06 NOTE — Assessment & Plan Note (Signed)
Stable. Continue Depakote 500 mg daily and Ziprasidone 80 mg BID.

## 2014-09-06 NOTE — Assessment & Plan Note (Signed)
Patient would like flu shot but clinic currently not administering until end of month. Patient instructed to come back next month for flu shot.

## 2014-09-06 NOTE — Assessment & Plan Note (Signed)
Last lipid panel in Nov 2015 showed Chol 233, Trigly 272, HDL 34, and LDL 145. Patient was not started on medication at that time. Will recheck lipid profile today. Patient is obese with BMI 33 and would benefit from lifestyle modifications first. We discussed incorporating more fruits and vegetables in his diet and eating less fried foods. If lipids do not improve with lifestyle modifications will consider statin treatment.

## 2014-09-07 LAB — BASIC METABOLIC PANEL
BUN / CREAT RATIO: 9 (ref 8–19)
BUN: 15 mg/dL (ref 6–20)
CALCIUM: 9.8 mg/dL (ref 8.7–10.2)
CHLORIDE: 103 mmol/L (ref 97–108)
CO2: 25 mmol/L (ref 18–29)
CREATININE: 1.71 mg/dL — AB (ref 0.76–1.27)
GFR, EST AFRICAN AMERICAN: 57 mL/min/{1.73_m2} — AB (ref >59)
GFR, EST NON AFRICAN AMERICAN: 49 mL/min/{1.73_m2} — AB (ref >59)
Glucose: 133 mg/dL — ABNORMAL HIGH (ref 65–99)
Potassium: 3.8 mmol/L (ref 3.5–5.2)
Sodium: 147 mmol/L — ABNORMAL HIGH (ref 134–144)

## 2014-09-07 LAB — LIPID PANEL
CHOL/HDL RATIO: 6.4 ratio — AB (ref 0.0–5.0)
CHOLESTEROL TOTAL: 224 mg/dL — AB (ref 100–199)
HDL: 35 mg/dL — AB (ref >39)
LDL Calculated: 140 mg/dL — ABNORMAL HIGH (ref 0–99)
TRIGLYCERIDES: 245 mg/dL — AB (ref 0–149)
VLDL Cholesterol Cal: 49 mg/dL — ABNORMAL HIGH (ref 5–40)

## 2015-03-14 ENCOUNTER — Ambulatory Visit (INDEPENDENT_AMBULATORY_CARE_PROVIDER_SITE_OTHER): Payer: Medicaid Other | Admitting: Internal Medicine

## 2015-03-14 ENCOUNTER — Encounter: Payer: Medicaid Other | Admitting: Internal Medicine

## 2015-03-14 ENCOUNTER — Encounter: Payer: Self-pay | Admitting: Internal Medicine

## 2015-03-14 VITALS — BP 151/96 | HR 84 | Temp 97.9°F | Ht 72.0 in | Wt 234.8 lb

## 2015-03-14 DIAGNOSIS — F3170 Bipolar disorder, currently in remission, most recent episode unspecified: Secondary | ICD-10-CM

## 2015-03-14 DIAGNOSIS — E785 Hyperlipidemia, unspecified: Secondary | ICD-10-CM

## 2015-03-14 DIAGNOSIS — I10 Essential (primary) hypertension: Secondary | ICD-10-CM

## 2015-03-14 DIAGNOSIS — Z79899 Other long term (current) drug therapy: Secondary | ICD-10-CM | POA: Diagnosis not present

## 2015-03-14 DIAGNOSIS — I129 Hypertensive chronic kidney disease with stage 1 through stage 4 chronic kidney disease, or unspecified chronic kidney disease: Secondary | ICD-10-CM

## 2015-03-14 DIAGNOSIS — N183 Chronic kidney disease, stage 3 unspecified: Secondary | ICD-10-CM

## 2015-03-14 DIAGNOSIS — F319 Bipolar disorder, unspecified: Secondary | ICD-10-CM | POA: Diagnosis not present

## 2015-03-14 LAB — GLUCOSE, CAPILLARY: Glucose-Capillary: 84 mg/dL (ref 65–99)

## 2015-03-14 LAB — POCT GLYCOSYLATED HEMOGLOBIN (HGB A1C): Hemoglobin A1C: 6.6

## 2015-03-14 MED ORDER — AMLODIPINE BESYLATE 10 MG PO TABS: 10.0000 mg | ORAL_TABLET | Freq: Every day | ORAL | Status: DC

## 2015-03-14 MED ORDER — ZIPRASIDONE HCL 80 MG PO CAPS: 80.0000 mg | ORAL_CAPSULE | Freq: Two times a day (BID) | ORAL | Status: DC

## 2015-03-14 MED ORDER — DIVALPROEX SODIUM ER 500 MG PO TB24: 500.0000 mg | ORAL_TABLET | Freq: Every day | ORAL | Status: DC

## 2015-03-14 MED ORDER — LISINOPRIL 10 MG PO TABS
10.0000 mg | ORAL_TABLET | Freq: Every day | ORAL | Status: DC
Start: 2015-03-14 — End: 2015-07-18

## 2015-03-14 NOTE — Patient Instructions (Signed)
-   Please decrease the amount of salty foods you are eating. This includes packaged foods, especially chips, crackers, microwave dinners, canned soups. - I will refer you to Ms. Lupita Leash for nutrition education. You will be called to schedule an appointment. - Increase your exercise regimen. You should walk up to 5 times a week for 30 minutes each time. Hopefully this will help lower your cholesterol so you do not have to take medication. - Follow up in 3 months to check cholesterol  General Instructions:   Please bring your medicines with you each time you come to clinic.  Medicines may include prescription medications, over-the-counter medications, herbal remedies, eye drops, vitamins, or other pills.   Progress Toward Treatment Goals:  Treatment Goal 11/23/2013  Blood pressure at goal    Self Care Goals & Plans:  Self Care Goal 09/06/2014  Manage my medications take my medicines as prescribed; bring my medications to every visit  Monitor my health keep track of my blood pressure; bring my blood pressure log to each visit  Eat healthy foods eat foods that are low in salt; eat baked foods instead of fried foods; eat more vegetables  Be physically active find an activity I enjoy    No flowsheet data found.   Care Management & Community Referrals:  Referral 11/23/2013  Referrals made for care management support none needed

## 2015-03-15 LAB — CMP14 + ANION GAP
A/G RATIO: 1.6 (ref 1.1–2.5)
ALK PHOS: 70 IU/L (ref 39–117)
ALT: 24 IU/L (ref 0–44)
AST: 31 IU/L (ref 0–40)
Albumin: 4.4 g/dL (ref 3.5–5.5)
Anion Gap: 17 mmol/L (ref 10.0–18.0)
BILIRUBIN TOTAL: 0.2 mg/dL (ref 0.0–1.2)
BUN/Creatinine Ratio: 11 (ref 8–19)
BUN: 19 mg/dL (ref 6–20)
CALCIUM: 9.7 mg/dL (ref 8.7–10.2)
CHLORIDE: 102 mmol/L (ref 96–106)
CO2: 25 mmol/L (ref 18–29)
Creatinine, Ser: 1.7 mg/dL — ABNORMAL HIGH (ref 0.76–1.27)
GFR calc Af Amer: 57 mL/min/{1.73_m2} — ABNORMAL LOW (ref >59)
GFR calc non Af Amer: 50 mL/min/{1.73_m2} — ABNORMAL LOW (ref >59)
GLOBULIN, TOTAL: 2.8 g/dL (ref 1.5–4.5)
GLUCOSE: 91 mg/dL (ref 65–99)
POTASSIUM: 3.8 mmol/L (ref 3.5–5.2)
SODIUM: 144 mmol/L (ref 134–144)
Total Protein: 7.2 g/dL (ref 6.0–8.5)

## 2015-03-15 NOTE — Assessment & Plan Note (Signed)
We discussed that his lipids have remained elevated the last 2 times they have been checked. His ASCVD risk score is 8.6%. He would like to try lifestyle changes first to see if he can avoid getting on a statin. We discussed improving his diet and increasing his exercise regimen. I will give him 3 months to achieve this goal.  - Follow up in 3 months, check lipids at that time - He will need a moderate intensity statin if chooses to start this therapy - Referral to Lupita Leash for nutrition education  - I will check a HbA1c as well for risk stratification as his blood sugars have been elevated in the past>> A1c elevated at 6.6

## 2015-03-15 NOTE — Assessment & Plan Note (Signed)
BP Readings from Last 3 Encounters:  03/14/15 151/96  09/06/14 143/92  11/23/13 127/87    Lab Results  Component Value Date   NA 144 03/14/2015   K 3.8 03/14/2015   CREATININE 1.70* 03/14/2015    Assessment: Blood pressure control: mildly elevated Progress toward BP goal:  unchanged Comments: BP mildly elevated today. Could be elevated due to his high salt diet.   Plan: Medications:  Continue Amlodipine 10 mg daily and Lisinopril 10 mg daily.  Other plans:  - We discussed the importance of keeping his blood pressure under good control with his kidney disease - Recommended cutting back on salt intake and avoiding high salt foods including packaged foods, frozen dinners, canned soup, etc - Referral placed to Lupita Leash to help with nutrition options (low salt, low cholesterol)

## 2015-03-15 NOTE — Assessment & Plan Note (Signed)
Check CMET today>> Cr stable at 1.7

## 2015-03-15 NOTE — Progress Notes (Signed)
   Subjective:    Patient ID: Kyle Montgomery, male    DOB: October 08, 1975, 40 y.o.   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  HTN: BP today is mildly elevated at 161/99. Repeat BP was 151/96. He is taking Amlodipine 10 mg daily and Lisinopril 10 mg daily. He admits to eating a high salt diet lately, including frozen dinners and fast food.   CKD Stage 3: Secondary to lithium use. Last Cr 1.71 in Aug 2016, which is at his baseline of 1.6-1.7. He denies any difficulty urinating.   Hyperlipidemia: Last lipid panel in Aug 2016 with Chol 224, HDL 35, and LDL 140. He would like to try lifestyle interventions for a few months to see if he can get his lipids lowered before going on medication. He is motivated to increase his walking to 150 mins per week and to improve his food choices.   Bipolar Disorder: He has remained stable on Depakote 500 mg daily and Geodon 80 mg BID.    Review of Systems General: Denies fever, chills, night sweats, changes in weight, changes in appetite HEENT: Denies headaches, ear pain, changes in vision, rhinorrhea, sore throat CV: Denies CP, palpitations, SOB, orthopnea Pulm: Denies SOB, cough, wheezing GI: Denies abdominal pain, nausea, vomiting, diarrhea, constipation, melena, hematochezia GU: Denies dysuria, hematuria, frequency Msk: Denies muscle cramps, joint pains Neuro: Denies weakness, numbness, tingling Skin: Denies rashes, bruising Psych: Denies depression, anxiety, hallucinations    Objective:   Physical Exam General: young, obese man sitting up, NAD HEENT: [place]/AT, EOMI, sclera anicteric, mucus membranes moist CV: RRR, no m/g/r Pulm: CTA bilaterally, breaths non-labored  Abd: BS+, soft, obese, non-tender Ext: warm, no peripheral edema Neuro: alert and oriented x 3     Assessment & Plan:  Please refer to A&P  documentation.

## 2015-03-15 NOTE — Assessment & Plan Note (Signed)
Stable on current regimen. He has not had his LFTs checked in 3 years. I will check a CMET today as depakote can cause hepatic failure.  - Check CMET>> LFTs normal - Continue Depakote 500 mg daily and Geodon 80 mg BID

## 2015-03-16 NOTE — Progress Notes (Signed)
Internal Medicine Clinic Attending  Case discussed with Dr. Rivet at the time of the visit.  We reviewed the resident's history and exam and pertinent patient test results.  I agree with the assessment, diagnosis, and plan of care documented in the resident's note.  

## 2015-03-16 NOTE — Addendum Note (Signed)
Addended by: Debe Coder B on: 03/16/2015 02:08 PM   Modules accepted: Level of Service

## 2015-03-21 ENCOUNTER — Encounter: Payer: Medicaid Other | Admitting: Internal Medicine

## 2015-03-22 ENCOUNTER — Other Ambulatory Visit: Payer: Self-pay | Admitting: Internal Medicine

## 2015-03-30 ENCOUNTER — Emergency Department (HOSPITAL_COMMUNITY): Payer: Medicaid Other

## 2015-03-30 ENCOUNTER — Emergency Department (HOSPITAL_COMMUNITY)
Admission: EM | Admit: 2015-03-30 | Discharge: 2015-03-30 | Disposition: A | Discharge status: Home / Self Care | Payer: Medicaid Other | Attending: Emergency Medicine | Admitting: Emergency Medicine

## 2015-03-30 ENCOUNTER — Encounter (HOSPITAL_COMMUNITY): Payer: Self-pay | Admitting: Emergency Medicine

## 2015-03-30 DIAGNOSIS — Y9289 Other specified places as the place of occurrence of the external cause: Secondary | ICD-10-CM | POA: Insufficient documentation

## 2015-03-30 DIAGNOSIS — Z87448 Personal history of other diseases of urinary system: Secondary | ICD-10-CM | POA: Insufficient documentation

## 2015-03-30 DIAGNOSIS — Y9301 Activity, walking, marching and hiking: Secondary | ICD-10-CM | POA: Insufficient documentation

## 2015-03-30 DIAGNOSIS — Z8639 Personal history of other endocrine, nutritional and metabolic disease: Secondary | ICD-10-CM | POA: Insufficient documentation

## 2015-03-30 DIAGNOSIS — I10 Essential (primary) hypertension: Secondary | ICD-10-CM | POA: Insufficient documentation

## 2015-03-30 DIAGNOSIS — X501XXA Overexertion from prolonged static or awkward postures, initial encounter: Secondary | ICD-10-CM | POA: Insufficient documentation

## 2015-03-30 DIAGNOSIS — Z87438 Personal history of other diseases of male genital organs: Secondary | ICD-10-CM | POA: Insufficient documentation

## 2015-03-30 DIAGNOSIS — Z87891 Personal history of nicotine dependence: Secondary | ICD-10-CM | POA: Diagnosis not present

## 2015-03-30 DIAGNOSIS — Y998 Other external cause status: Secondary | ICD-10-CM | POA: Diagnosis not present

## 2015-03-30 DIAGNOSIS — S99922A Unspecified injury of left foot, initial encounter: Secondary | ICD-10-CM | POA: Diagnosis not present

## 2015-03-30 DIAGNOSIS — F319 Bipolar disorder, unspecified: Secondary | ICD-10-CM | POA: Diagnosis not present

## 2015-03-30 DIAGNOSIS — Z79899 Other long term (current) drug therapy: Secondary | ICD-10-CM | POA: Insufficient documentation

## 2015-03-30 DIAGNOSIS — M79672 Pain in left foot: Secondary | ICD-10-CM

## 2015-03-30 MED ORDER — ACETAMINOPHEN 325 MG PO TABS
650.0000 mg | ORAL_TABLET | Freq: Once | ORAL | Status: AC
  Administered 2015-03-30: 650 mg via ORAL
  Filled 2015-03-30: qty 2

## 2015-03-30 MED ORDER — ACETAMINOPHEN 325 MG PO TABS: 650.0000 mg | ORAL_TABLET | Freq: Four times a day (QID) | ORAL | Status: DC | PRN

## 2015-03-30 NOTE — Discharge Instructions (Signed)

## 2015-03-30 NOTE — ED Notes (Signed)
Patient able to ambulate independently  

## 2015-03-30 NOTE — ED Notes (Signed)
Pt presents to ED for assessment of a twisted foot on the left side yesterday at work.  Pt able to ambulate independently

## 2015-03-30 NOTE — ED Provider Notes (Signed)
CSN: [id number]     Arrival date & time 03/30/15  1713 History  By signing my name below, I, Tanda Rockers, attest that this documentation has been prepared under the direction and in the presence of Everlene Farrier, PA-C. Electronically Signed: Tanda Rockers, ED Scribe. 03/30/2015. 5:30 PM.   Chief Complaint  Patient presents with  . Foot Pain   The history is provided by the patient. No language interpreter was used.    HPI Comments: Kyle Montgomery is a 40 y.o. male who presents to the Emergency Department complaining of sudden onset, constant, 5/10, left dorsal foot pain and swelling that began 1 day ago. Pt states he was walking in his flip flops yesterday when he inverted his left foot, causing pain to his left foot. He denies any pain to the ankle. Pt took Tylenol today without relief. Denies weakness, numbness, tingling, or any other associated symptoms.   Past Medical History  Diagnosis Date  . Hypertension   . Renal insufficiency     baseline Cr 2.2 - renal US with increased Echo signal, no nephrology eval, no bx  . Gynecomastia 02/07    normal pituitary by MRI  . Hyperprolactinemia (HCC) 09/27/04    felt 2/2 lithium and fluphenazine  . Bipolar affective (HCC)     followed by mental health   History reviewed. No pertinent past surgical history. Family History  Problem Relation Age of Onset  . Hyperlipidemia Mother   . Hypertension Mother   . Atrial fibrillation Mother   . Hypertension Brother   . Hypertension Maternal Grandmother   . Cancer Maternal Grandmother    Social History  Substance Use Topics  . Smoking status: Former Smoker    Types: Cigarettes  . Smokeless tobacco: None  . Alcohol Use: No    Review of Systems  Musculoskeletal: Positive for joint swelling and arthralgias (left foot. ).  Neurological: Negative for weakness and numbness.   Allergies  Review of patient's allergies indicates no known allergies.  Home Medications   Prior to Admission  medications   Medication Sig Start Date End Date Taking? Authorizing Provider  acetaminophen (TYLENOL) 325 MG tablet Take 2 tablets (650 mg total) by mouth every 6 (six) hours as needed for mild pain or moderate pain. 03/30/15   Everlene Farrier, PA-C  amLODipine (NORVASC) 10 MG tablet Take 1 tablet (10 mg total) by mouth daily. 03/14/15   Carly J Rivet, MD  amLODipine (NORVASC) 10 MG tablet TAKE 1 TABLET (10 MG TOTAL) BY MOUTH DAILY. 03/23/15   Su Hoff, MD  divalproex (DEPAKOTE ER) 500 MG 24 hr tablet Take 1 tablet (500 mg total) by mouth daily. 03/14/15   Carly Arlyce Harman, MD  lisinopril (PRINIVIL,ZESTRIL) 10 MG tablet Take 1 tablet (10 mg total) by mouth daily. 03/14/15 04/28/16  Su Hoff, MD  ziprasidone (GEODON) 80 MG capsule Take 1 capsule (80 mg total) by mouth 2 (two) times daily with a meal. 03/14/15   Carly J Rivet, MD   BP 150/90 mmHg  Pulse 90  Temp(Src) 98.1 F (36.7 C) (Oral)  Resp 18  Ht  (1.702 m)  Wt 107.502 kg  BMI 37.11 kg/m2  SpO2 100%   Physical Exam  Constitutional: He appears well-developed and well-nourished. No distress.  HENT:  Head: Normocephalic and atraumatic.  Eyes: Right eye exhibits no discharge. Left eye exhibits no discharge.  Cardiovascular: Normal rate, regular rhythm and intact distal pulses.   Bilateral dorsalis pedis and posterior tibialis  pulses are intact. Good capillary refill.  Pulmonary/Chest: Effort normal. No respiratory distress.  Musculoskeletal: Normal range of motion. He exhibits tenderness. He exhibits no edema.  Tenderness to dorsal aspect of left foot Bilateral dorsalis pedal pulses and posterior tibialis pulses intact Good cap refill. No deformity.  Neurological: He is alert. Coordination normal.  Sensation is intact in his bilateral distal toes.  Skin: Skin is warm and dry. No rash noted. He is not diaphoretic. No erythema. No pallor.  Psychiatric: He has a normal mood and affect. His behavior is normal.  Nursing note and  vitals reviewed.   ED Course  Procedures (including critical care time)  DIAGNOSTIC STUDIES: Oxygen Saturation is 100% on RA, normal by my interpretation.    COORDINATION OF CARE: 5:30 PM-Discussed treatment plan which includes DG L Foot with pt at bedside and pt agreed to plan.   Labs Review Labs Reviewed - No data to display  Imaging Review Dg Foot Complete Left  03/30/2015  CLINICAL DATA:  Left dorsal foot pain.  Fall yesterday EXAM: LEFT FOOT - COMPLETE 3+ VIEW COMPARISON:  None. FINDINGS: There is no evidence of fracture or dislocation. There is no evidence of arthropathy or other focal bone abnormality. Soft tissues are unremarkable. IMPRESSION: Negative. Electronically Signed   By: Marlan Palauharles  Clark M.D.   On: 03/30/2015 18:04   I have personally reviewed and evaluated these images as part of my medical decision-making.   EKG Interpretation None      Filed Vitals:   03/30/15 1721  BP: 150/90  Pulse: 90  Temp: 98.1 F (36.7 C)  TempSrc: Oral  Resp: 18  Height: 5\' 7"  (1.702 m)  Weight: 107.502 kg  SpO2: 100%     MDM   Meds given in ED:  Medications  acetaminophen (TYLENOL) tablet 650 mg (650 mg Oral Given 03/30/15 1733)    New Prescriptions   ACETAMINOPHEN (TYLENOL) 325 MG TABLET    Take 2 tablets (650 mg total) by mouth every 6 (six) hours as needed for mild pain or moderate pain.    Final diagnoses:  Left foot pain   This is a 40 y.o. male who presents to the Emergency Department complaining of sudden onset, constant, 5/10, left dorsal foot pain and swelling that began 1 day ago. Pt states he was walking in his flip flops yesterday when he inverted his left foot, causing pain to his left foot. He denies any pain to the ankle. On exam the patient is afebrile and nontoxic appearing. He has some tenderness over the dorsal aspect of his left foot. No deformity. He is neurovascularly intact. Left foot x-ray is unremarkable. I discussed these findings at the patient.  He is requesting a postop shoe. Will provide this to the patient. We'll discharge with prescription for Tylenol and encouraged close follow-up by his primary care provider. I advised the patient to follow-up with their primary care provider this week. I advised the patient to return to the emergency department with new or worsening symptoms or new concerns. The patient verbalized understanding and agreement with plan.   I personally performed the services described in this documentation, which was scribed in my presence. The recorded information has been reviewed and is accurate.     Everlene FarrierWilliam Cathryn Gallery, PA-C 03/30/15 1817  Pricilla LovelessScott Goldston, MD 04/05/15 [phone]

## 2015-04-03 ENCOUNTER — Encounter: Payer: Medicaid Other | Admitting: Dietician

## 2015-04-06 ENCOUNTER — Ambulatory Visit: Payer: Medicaid Other | Admitting: Internal Medicine

## 2015-04-11 ENCOUNTER — Encounter: Payer: Medicaid Other | Admitting: Internal Medicine

## 2015-05-03 NOTE — Addendum Note (Signed)
Addended by: Neomia DearPOWERS, Electra Paladino E on: 05/03/2015 08:03 PM   Modules accepted: Orders

## 2015-07-12 ENCOUNTER — Telehealth: Payer: Self-pay | Admitting: Internal Medicine

## 2015-07-12 NOTE — Telephone Encounter (Signed)
Lm for rtc 

## 2015-07-12 NOTE — Telephone Encounter (Signed)
Has some question for nurse

## 2015-07-13 ENCOUNTER — Ambulatory Visit (INDEPENDENT_AMBULATORY_CARE_PROVIDER_SITE_OTHER): Payer: Medicaid Other | Admitting: Internal Medicine

## 2015-07-13 ENCOUNTER — Encounter: Payer: Self-pay | Admitting: Internal Medicine

## 2015-07-13 VITALS — BP 156/100 | HR 74 | Temp 98.0°F | Ht 72.0 in | Wt 235.8 lb

## 2015-07-13 DIAGNOSIS — E785 Hyperlipidemia, unspecified: Secondary | ICD-10-CM | POA: Diagnosis not present

## 2015-07-13 DIAGNOSIS — I10 Essential (primary) hypertension: Secondary | ICD-10-CM | POA: Diagnosis not present

## 2015-07-13 MED ORDER — ATORVASTATIN CALCIUM 20 MG PO TABS: 20.0000 mg | ORAL_TABLET | Freq: Every day | ORAL | Status: DC

## 2015-07-13 NOTE — Patient Instructions (Signed)
General Instructions: - Start Atorvastatin (Lipitor) 20 mg daily for your cholesterol - Kyle Montgomery (our nutritionist) will call you to schedule an appointment - Continue walking every day- that is great! - Work on cutting down on salty foods and fast foods - Check blood pressure daily in morning and record on log - Return in 2 weeks for blood pressure recheck  Please bring your medicines with you each time you come to clinic.  Medicines may include prescription medications, over-the-counter medications, herbal remedies, eye drops, vitamins, or other pills.   Progress Toward Treatment Goals:  Treatment Goal 03/14/2015  Blood pressure unchanged    Self Care Goals & Plans:  Self Care Goal 07/13/2015  Manage my medications take my medicines as prescribed; refill my medications on time; bring my medications to every visit  Monitor my health keep track of my blood pressure  Eat healthy foods eat more vegetables; eat foods that are low in salt; eat baked foods instead of fried foods  Be physically active find an activity I enjoy; take a walk every day    No flowsheet data found.   Care Management & Community Referrals:  Referral 03/14/2015  Referrals made for care management support nutritionist

## 2015-07-15 NOTE — Assessment & Plan Note (Addendum)
BP mildly elevated today. We discussed making serious changes to his diet today, including avoiding salty foods and cutting down fast food to once a month. He really would like to try to make dietary changes first before adjusting his medications. I have asked him to record his morning BPs for the next 2 weeks and then return for a BP recheck. Will continue current regimen for now but likely will need to increase Lisinopril next visit. Ref[facility] placed to [facility] for [facility].

## 2015-07-15 NOTE — Progress Notes (Signed)
   Subjective:    Patient ID: Kyle Montgomery, male    DOB: January 14, 1976, 40 y.o.   MRN: [id number]  HPI Kyle Montgomery is a 40yo man with PMHx of HTN, CKD Stage 3, hyperlipidemia, and bipolar disorder who presents today for follow up of his hypertension.  HTN: BP 156/100 today. Reports he has been taking his Amlodipine 10 mg daily and Lisinopril 10 mg daily. He admits to eating lots of salty foods and fast food recently. He has been keeping up with daily 20 minute walk though. He is interested in getting nutritional advice so that he can improve his blood pressure.   Hyperlipidemia: Last visit, we discussed working on lifestyle modifications in order to lower his cholesterol level. He has been doing a good job of taking his daily walk but admits he has not been following a healthy diet, including high sodium and high fat content.    Review of Systems General: Denies fever, chills, night sweats, changes in weight, changes in appetite HEENT: Denies headaches, ear pain, changes in vision, rhinorrhea, sore throat CV: Denies CP, palpitations, SOB, orthopnea Pulm: Denies SOB, cough, wheezing GI: Denies abdominal pain, nausea, vomiting, diarrhea, constipation, melena, hematochezia GU: Denies dysuria, hematuria, frequency Msk: Denies muscle cramps, joint pains Neuro: Denies weakness, numbness, tingling Skin: Denies rashes, bruising Psych: Denies depression, anxiety, hallucinations    Objective:   Physical Exam General: alert, sitting up, pleasant  HEENT: Kyle Montgomery, EOMI, sclera anicteric, mucus membranes moist CV: RRR, no m/g/r Pulm: CTA bilaterally, breaths non-labored Abd: BS+, soft, obese, non-tender Ext: warm, trace peripheral edema bilaterally Neuro: alert and oriented x 3     Assessment & Plan:  Please refer to A&P documentation.

## 2015-07-15 NOTE — Assessment & Plan Note (Signed)
We discussed that his ASCVD risk score is 8.6% and that he would benefit from a moderate intensity statin. He admits that following a healthy diet is difficult for him and he would rather start statin therapy. He will still work on his diet to improve his blood pressure and overall health. Will start him on Atorvastatin 20 mg daily.

## 2015-07-16 NOTE — Progress Notes (Signed)
Medicine attending: Medical history, presenting problems, physical findings, and medications, reviewed with resident physician Dr Carly Rivet on the day of the patient visit and I concur with her evaluation and management plan. 

## 2015-07-18 ENCOUNTER — Emergency Department (HOSPITAL_COMMUNITY)
Admission: EM | Admit: 2015-07-18 | Discharge: 2015-07-18 | Disposition: A | Discharge status: Home / Self Care | Payer: Medicaid Other | Attending: Emergency Medicine | Admitting: Emergency Medicine

## 2015-07-18 ENCOUNTER — Encounter (HOSPITAL_COMMUNITY): Payer: Self-pay | Admitting: Nurse Practitioner

## 2015-07-18 ENCOUNTER — Telehealth: Payer: Self-pay | Admitting: Internal Medicine

## 2015-07-18 DIAGNOSIS — I10 Essential (primary) hypertension: Secondary | ICD-10-CM | POA: Diagnosis not present

## 2015-07-18 DIAGNOSIS — Z87891 Personal history of nicotine dependence: Secondary | ICD-10-CM | POA: Insufficient documentation

## 2015-07-18 DIAGNOSIS — Z79899 Other long term (current) drug therapy: Secondary | ICD-10-CM | POA: Diagnosis not present

## 2015-07-18 MED ORDER — LISINOPRIL 10 MG PO TABS
10.0000 mg | ORAL_TABLET | Freq: Once | ORAL | Status: AC
  Administered 2015-07-18: 10 mg via ORAL
  Filled 2015-07-18: qty 1

## 2015-07-18 MED ORDER — LISINOPRIL 20 MG PO TABS: 20.0000 mg | ORAL_TABLET | Freq: Every day | ORAL | Status: DC

## 2015-07-18 NOTE — ED Notes (Signed)
Pt reports fluctuations in BP over past 2 weeks. He was unable to obtain appointment with PCP so he came here. He reports he has been taking his BP medications daily as prescribed, with no recent changes. He denies any complaints. He is alert and breathing easily

## 2015-07-18 NOTE — Discharge Instructions (Signed)
Hypertension Hypertension, commonly called high blood pressure, is when the force of blood pumping through your arteries is too strong. Your arteries are the blood vessels that carry blood from your heart throughout your body. A blood pressure reading consists of a higher number over a lower number, such as 110/72. The higher number (systolic) is the pressure inside your arteries when your heart pumps. The lower number (diastolic) is the pressure inside your arteries when your heart relaxes. Ideally you want your blood pressure below 120/80. Hypertension forces your heart to work harder to pump blood. Your arteries may become narrow or stiff. Having untreated or uncontrolled hypertension can cause heart attack, stroke, kidney disease, and other problems. RISK FACTORS Some risk factors for high blood pressure are controllable. Others are not.  Risk factors you cannot control include:   Race. You may be at higher risk if you are African American.  Age. Risk increases with age.  Gender. Men are at higher risk than women before age 45 years. After age 65, women are at higher risk than men. Risk factors you can control include:  Not getting enough exercise or physical activity.  Being overweight.  Getting too much fat, sugar, calories, or salt in your diet.  Drinking too much alcohol. SIGNS AND SYMPTOMS Hypertension does not usually cause signs or symptoms. Extremely high blood pressure (hypertensive crisis) may cause headache, anxiety, shortness of breath, and nosebleed. DIAGNOSIS To check if you have hypertension, your health care provider will measure your blood pressure while you are seated, with your arm held at the level of your heart. It should be measured at least twice using the same arm. Certain conditions can cause a difference in blood pressure between your right and left arms. A blood pressure reading that is higher than normal on one occasion does not mean that you need treatment. If  it is not clear whether you have high blood pressure, you may be asked to return on a different day to have your blood pressure checked again. Or, you may be asked to monitor your blood pressure at home for 1 or more weeks. TREATMENT Treating high blood pressure includes making lifestyle changes and possibly taking medicine. Living a healthy lifestyle can help lower high blood pressure. You may need to change some of your habits. Lifestyle changes may include:  Following the DASH diet. This diet is high in fruits, vegetables, and whole grains. It is low in salt, red meat, and added sugars.  Keep your sodium intake below 2,300 mg per day.  Getting at least 30-45 minutes of aerobic exercise at least 4 times per week.  Losing weight if necessary.  Not smoking.  Limiting alcoholic beverages.  Learning ways to reduce stress. Your health care provider may prescribe medicine if lifestyle changes are not enough to get your blood pressure under control, and if one of the following is true:  You are 18-59 years of age and your systolic blood pressure is above 140.  You are 60 years of age or older, and your systolic blood pressure is above 150.  Your diastolic blood pressure is above 90.  You have diabetes, and your systolic blood pressure is over 140 or your diastolic blood pressure is over 90.  You have kidney disease and your blood pressure is above 140/90.  You have heart disease and your blood pressure is above 140/90. Your personal target blood pressure may vary depending on your medical conditions, your age, and other factors. HOME CARE INSTRUCTIONS    Have your blood pressure rechecked as directed by your health care provider.   Take medicines only as directed by your health care provider. Follow the directions carefully. Blood pressure medicines must be taken as prescribed. The medicine does not work as well when you skip doses. Skipping doses also puts you at risk for  problems.  Do not smoke.   Monitor your blood pressure at home as directed by your health care provider. SEEK MEDICAL CARE IF:   You think you are having a reaction to medicines taken.  You have recurrent headaches or feel dizzy.  You have swelling in your ankles.  You have trouble with your vision. SEEK IMMEDIATE MEDICAL CARE IF:  You develop a severe headache or confusion.  You have unusual weakness, numbness, or feel faint.  You have severe chest or abdominal pain.  You vomit repeatedly.  You have trouble breathing. MAKE SURE YOU:   Understand these instructions.  Will watch your condition.  Will get help right away if you are not doing well or get worse.   This information is not intended to replace advice given to you by your health care provider. Make sure you discuss any questions you have with your health care provider.   Document Released: 01/07/2005 Document Revised: 05/24/2014 Document Reviewed: 10/30/2012 Elsevier Interactive Patient Education 2016 [employer]  

## 2015-07-18 NOTE — Telephone Encounter (Signed)
wants to speak to nurse °

## 2015-07-18 NOTE — ED Provider Notes (Signed)
CSN: [id number]     Arrival date & time 07/18/15  1347 History   First MD Initiated Contact with Patient 07/18/15 1508     Chief Complaint  Patient presents with  . Hypertension      Patient is a 40 y.o. male presenting with hypertension. The history is provided by the patient.  Hypertension Pertinent negatives include no chest pain, no abdominal pain, no headaches and no shortness of breath.  Patient was sent with high blood pressure. States that he has been feeling fine. No chest pain or trouble breathing. Seen 2 days ago at primary care office. Plan was to check outpatient blood pressures and her follow-up in 2 weeks. States he's been checking them and they have been high. No headache. No confusion. States he called the internal medicine clinic next not seen until tomorrow so he came into the ER. Reviewing the records it appears as if the plan was to increase the lisinopril. Patient states he has been compliant with his medications.   Past Medical History  Diagnosis Date  . Hypertension   . Renal insufficiency     baseline Cr 2.2 - renal US with increased Echo signal, no nephrology eval, no bx  . Gynecomastia 02/07    normal pituitary by MRI  . Hyperprolactinemia (HCC) 09/27/04    felt 2/2 lithium and fluphenazine  . Bipolar affective (HCC)     followed by mental health   History reviewed. No pertinent past surgical history. Family History  Problem Relation Age of Onset  . Hyperlipidemia Mother   . Hypertension Mother   . Atrial fibrillation Mother   . Hypertension Brother   . Hypertension Maternal Grandmother   . Cancer Maternal Grandmother    Social History  Substance Use Topics  . Smoking status: Former Smoker    Types: Cigarettes  . Smokeless tobacco: None  . Alcohol Use: No    Review of Systems  Constitutional: Negative for activity change and appetite change.  Eyes: Negative for pain.  Respiratory: Negative for chest tightness and shortness of breath.    Cardiovascular: Negative for chest pain and leg swelling.  Gastrointestinal: Negative for nausea, vomiting, abdominal pain and diarrhea.  Genitourinary: Negative for flank pain.  Musculoskeletal: Negative for back pain and neck stiffness.  Skin: Negative for rash.  Neurological: Negative for weakness, numbness and headaches.  Psychiatric/Behavioral: Negative for behavioral problems.      Allergies  Review of patient's allergies indicates no known allergies.  Home Medications   Prior to Admission medications   Medication Sig Start Date End Date Taking? Authorizing Provider  acetaminophen (TYLENOL) 325 MG tablet Take 2 tablets (650 mg total) by mouth every 6 (six) hours as needed for mild pain or moderate pain. 03/30/15  Yes Everlene FarrierWilliam Dansie, PA-C  amLODipine (NORVASC) 10 MG tablet TAKE 1 TABLET (10 MG TOTAL) BY MOUTH DAILY. 03/23/15  Yes Carly Arlyce HarmanJ Rivet, MD  atorvastatin (LIPITOR) 20 MG tablet Take 1 tablet (20 mg total) by mouth daily. 07/13/15  Yes Carly Arlyce HarmanJ Rivet, MD  divalproex (DEPAKOTE ER) 500 MG 24 hr tablet Take 1 tablet (500 mg total) by mouth daily. 03/14/15  Yes Su Hoffarly J Rivet, MD  ziprasidone (GEODON) 80 MG capsule Take 1 capsule (80 mg total) by mouth 2 (two) times daily with a meal. 03/14/15  Yes Carly J Rivet, MD  lisinopril (PRINIVIL,ZESTRIL) 20 MG tablet Take 1 tablet (20 mg total) by mouth daily. 07/18/15 09/01/16  Benjiman CoreNathan Stepehn Eckard, MD   BP 148/100 mmHg  Pulse 81  Temp(Src) 98.9 F (37.2 C) (Oral)  Resp 18  SpO2 98% Physical Exam  Constitutional: He appears well-developed.  HENT:  Head: Atraumatic.  Neck: Neck supple.  Cardiovascular: Normal rate.   Pulmonary/Chest: Effort normal.  Abdominal: Soft.  Musculoskeletal: Normal range of motion.  Neurological: He is alert.  Skin: Skin is warm.    ED Course  Procedures (including critical care time) Labs Review Labs Reviewed - No data to display  Imaging Review No results found. I have personally reviewed and evaluated  these images and lab results as part of my medical decision-making.   EKG Interpretation None      MDM   Final diagnoses:  Essential hypertension    Patient presents hypertension. Moderate here to discuss with patient and will increase fosinopril, as the plan was with internal medicine. He will still follow-up tomorrow.    Benjiman CoreNathan Arnette Driggs, MD 07/18/15 [phone]

## 2015-07-19 NOTE — Telephone Encounter (Signed)
Have called twice, no answer, no vmail

## 2015-07-21 NOTE — Telephone Encounter (Signed)
Lm w/ pt's mother for rtc

## 2015-08-08 ENCOUNTER — Encounter: Payer: Medicaid Other | Admitting: Internal Medicine

## 2015-08-08 ENCOUNTER — Ambulatory Visit: Payer: Medicaid Other | Admitting: Dietician

## 2015-08-08 ENCOUNTER — Encounter: Payer: Medicaid Other | Admitting: Dietician

## 2015-08-14 NOTE — Addendum Note (Signed)
Addended by: Neomia Dear on: 08/14/2015 08:24 PM   Modules accepted: Orders

## 2015-08-28 ENCOUNTER — Telehealth: Payer: Self-pay | Admitting: *Deleted

## 2015-08-28 ENCOUNTER — Encounter (HOSPITAL_COMMUNITY): Payer: Self-pay | Admitting: Emergency Medicine

## 2015-08-28 ENCOUNTER — Telehealth: Payer: Self-pay | Admitting: Internal Medicine

## 2015-08-28 ENCOUNTER — Ambulatory Visit (HOSPITAL_COMMUNITY)
Admission: EM | Admit: 2015-08-28 | Discharge: 2015-08-28 | Disposition: A | Discharge status: Home / Self Care | Payer: Medicaid Other | Attending: Emergency Medicine | Admitting: Emergency Medicine

## 2015-08-28 DIAGNOSIS — I10 Essential (primary) hypertension: Secondary | ICD-10-CM

## 2015-08-28 NOTE — Telephone Encounter (Signed)
Spoke with mother who took patient to fire departement for vital sign check. B/P: 160/110. HR: 115. Vitals not elevated too much over baseline. Patient denies HA, but mother said "His eyes are glassy". Advised mother to take him to Urgent Care as we have no appointments for today. Agreed and requested an appointment for tomorrow. [facility]

## 2015-08-28 NOTE — ED Triage Notes (Signed)
[facility] a [facility]. The patient's mother stated that they had the patient's vitals checked at the [employer] and they were elevated. They contacted their PCP and [facility] and made them an appointment for tomorrow. The patient has no complaints of pain.

## 2015-08-28 NOTE — Telephone Encounter (Signed)
APT. REMINDER CALL, NO VOICEMAIL °

## 2015-08-28 NOTE — Telephone Encounter (Signed)
Thank you for getting him an appointment. Would tell Dr that sees him to make sure he is taking increased dose of Lisinopril (20 mg daily). If he is taking that dose, can consider increasing further.

## 2015-08-28 NOTE — Discharge Instructions (Signed)
His blood pressure is a little elevated here, but nothing scary. Please work on decreasing salt in his diet. Fast food should be no more than once a month. He is welcome to put other flavors on his food such as herbs, spices, and garlic. Please follow-up with his regular doctor as scheduled tomorrow.

## 2015-08-28 NOTE — ED Provider Notes (Signed)
[facility]    CSN: [id number] Arrival date & time: 08/28/15  1144  First Provider Contact:  First MD Initiated Contact with Patient 08/28/15 1233        History   Chief Complaint Chief Complaint  Patient presents with  . Hypertension    HPI Kyle Montgomery is a 40 y.o. male.   -year-old man here with his mom for evaluation of hypertension. He has a known history of hypertension for which she takes amlodipine and lisinopril. He was at [facility] earlier today and the fire department took his blood pressure in a noisy and crowded environment. It was elevated at 160/110. Mom called his PCP who sent them up for an appointment tomorrow morning. They were concerned, so came to the urgent care. Blood pressure here is elevated, but improved at 158/86. He denies any headache, shortness of breath, chest pain, or vision changes. He does admit to eating high salt diet.    Past Medical History:  Diagnosis Date  . Bipolar affective (HCC)    followed by mental health  . Gynecomastia 02/07   normal pituitary by MRI  . 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 Hypertension   . Renal insufficiency    baseline Cr 2.2 - renal US with increased Echo signal, no nephrology eval, no bx    Patient Active Problem List   Diagnosis Date Noted  . Hyperlipidemia 09/06/2014  . Healthcare maintenance 12/01/2012  . CKD (chronic kidney disease), stage III 09/01/2012  . Bipolar disorder (HCC) 11/06/2005  . Essential hypertension 11/06/2005    History reviewed. No pertinent surgical history.     Home Medications    Prior to Admission medications   Medication Sig Start Date End Date Taking? Authorizing Provider  acetaminophen (TYLENOL) 325 MG tablet Take 2 tablets (650 mg total) by mouth every 6 (six) hours as needed for mild pain or moderate pain. 03/30/15   Everlene Farrier, PA-C  amLODipine (NORVASC) 10 MG tablet TAKE 1 TABLET (10 MG TOTAL) BY MOUTH DAILY.  03/23/15   Carly Arlyce Harman, MD  atorvastatin (LIPITOR) 20 MG tablet Take 1 tablet (20 mg total) by mouth daily. 07/13/15   Su Hoff, MD  divalproex (DEPAKOTE ER) 500 MG 24 hr tablet Take 1 tablet (500 mg total) by mouth daily. 03/14/15   Carly Arlyce Harman, MD  lisinopril (PRINIVIL,ZESTRIL) 20 MG tablet Take 1 tablet (20 mg total) by mouth daily. 07/18/15 09/01/16  Benjiman Core, MD  ziprasidone (GEODON) 80 MG capsule Take 1 capsule (80 mg total) by mouth 2 (two) times daily with a meal. 03/14/15   Su Hoff, MD    Family History Family History  Problem Relation Age of Onset  . Hyperlipidemia Mother   . Hypertension Mother   . Atrial fibrillation Mother   . Hypertension Brother   . Hypertension Maternal Grandmother   . Cancer Maternal Grandmother     Social History Social History  Substance Use Topics  . Smoking status: Former Smoker    Types: Cigarettes  . Smokeless tobacco: Never Used  . Alcohol use No     Allergies   Review of patient's allergies indicates no known allergies.   Review of Systems Review of Systems  Constitutional: Negative for fever.  Eyes: Negative for visual disturbance.  Respiratory: Negative for shortness of breath.   Cardiovascular: Negative for chest pain.  Neurological: Negative for headaches.     Physical Exam Triage Vital Signs ED Triage  Vitals  Enc Vitals Group     BP 08/28/15 1228 158/86     Pulse Rate 08/28/15 1228 93     Resp 08/28/15 1228 20     Temp 08/28/15 1228 98.4 F (36.9 C)     Temp Source 08/28/15 1228 Oral     SpO2 08/28/15 1228 97 %     Weight --      Height --      Head Circumference --      Peak Flow --      Pain Score 08/28/15 1235 0     Pain Loc --      Pain Edu? --      Excl. in GC? --    No data found.   Updated Vital Signs BP 158/86 (BP Location: Right Arm)   Pulse 93   Temp 98.4 F (36.9 C) (Oral)   Resp 20   SpO2 97%   Visual Acuity Right Eye Distance:   Left Eye Distance:   Bilateral  Distance:    Right Eye Near:   Left Eye Near:    Bilateral Near:     Physical Exam  Constitutional: He is oriented to person, place, and time. He appears well-developed and well-nourished.  Cardiovascular: Normal rate, regular rhythm and normal heart sounds.   No murmur heard. Pulmonary/Chest: Effort normal and breath sounds normal. He has no wheezes. He has no rales.  Neurological: He is alert and oriented to person, place, and time.     UC Treatments / Results  Labs (all labs ordered are listed, but only abnormal results are displayed) Labs Reviewed - No data to display  EKG  EKG Interpretation None       Radiology No results found.  Procedures Procedures (including critical care time)  Medications Ordered in UC Medications - No data to display   Initial Impression / Assessment and Plan / UC Course  I have reviewed the triage vital signs and the nursing notes.  Pertinent labs & imaging results that were available during my care of the patient were reviewed by me and considered in my medical decision making (see chart for details).  Clinical Course    Reiterated low salt diet. Recommended that his primary care doctor adjust medications as needed tomorrow. Follow-up as needed.  Final Clinical Impressions(s) / UC Diagnoses   Final diagnoses:  Essential hypertension    New Prescriptions Discharge Medication List as of 08/28/2015 12:49 PM       Charm RingsErin J Brandelyn Henne, MD 08/28/15 1259

## 2015-08-29 ENCOUNTER — Ambulatory Visit (INDEPENDENT_AMBULATORY_CARE_PROVIDER_SITE_OTHER): Payer: Medicaid Other | Admitting: Internal Medicine

## 2015-08-29 VITALS — BP 139/93 | HR 90 | Temp 97.8°F | Ht 72.0 in | Wt 227.1 lb

## 2015-08-29 DIAGNOSIS — I10 Essential (primary) hypertension: Secondary | ICD-10-CM | POA: Diagnosis not present

## 2015-08-29 MED ORDER — LISINOPRIL 40 MG PO TABS: 40.0000 mg | ORAL_TABLET | Freq: Every day | ORAL | 3 refills | Status: DC

## 2015-08-29 NOTE — Progress Notes (Signed)
    CC: HTN  HPI: [name] is a 40 y.o. male with PMHx of HTN, bipolar disorder who presents to the clinic for follow up for HTN. Please see problem oriented charting for more information.  Past Medical History:  Diagnosis Date  . Bipolar affective (HCC)    followed by mental health  . Gynecomastia 02/07   normal pituitary by MRI  . 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 Hypertension   . Renal insufficiency    baseline Cr 2.2 - renal US with increased Echo signal, no nephrology eval, no bx    Review of Systems: A complete ROS was negative except as noted in HPI.   Physical Exam: Vitals:   08/29/15 0821  BP: (!) 139/93  Pulse: 90  Temp: 97.8 F (36.6 C)  TempSrc: Oral  SpO2: 100%  Weight: 227 lb 1.6 oz (103 kg)  Height: 6' (1.829 m)   General: Vital signs reviewed.  Patient is well-developed and well-nourished, in no acute distress and cooperative with exam.  Neck: Supple, trachea midline, no carotid bruit present.  Cardiovascular: RRR, S1 normal, S2 normal, no murmurs, gallops, or rubs. Pulmonary/Chest: Clear to auscultation bilaterally, no wheezes, rales, or rhonchi. Abdominal: Soft, non-tender, non-distended, BS + Extremities: No lower extremity edema bilaterally Skin: Warm, dry and intact. No rashes or erythema.  Assessment & Plan:  See encounters tab for problem based medical decision making. Patient discussed with Dr. Criselda PeachesMullen

## 2015-08-29 NOTE — Patient Instructions (Signed)
Please take Lisinopril 40 mg once a day. You can take 2 of your lisinopril 20 mg pills once a day until you run out.  Please return in 2-4 weeks for a blood pressure recheck and lab work.   Continue all other medications the same.

## 2015-08-29 NOTE — Assessment & Plan Note (Signed)
BP Readings from Last 3 Encounters:  08/29/15 (!) 139/93  08/28/15 158/86  07/18/15 148/100    Lab Results  Component Value Date   NA 144 03/14/2015   K 3.8 03/14/2015   CREATININE 1.70 (H) 03/14/2015    Assessment: Blood pressure control:  Diastolic above goal Progress toward BP goal:   Improved Comments: Patient reports compliance with amlodipine 10 mg daily and lisinopril 20 mg daily. At home, patient and mother have been noticing elevated BPs in the 150-160s/110s. Patient denies headache, vision changes, chest pain, shortness of breath, or palpitations. [facility] and mildly hypertensive and instructed to follow a low salt diet.   Plan: Medications:  Given elevated blood pressures at home over the last one month, will increase lisinopril to 40 mg daily and continue amlodipine 10 mg daily.  Educational resources provided:   Self management tools provided:   Other plans: Follow up in 2-4 weeks for BP recheck and BMET.

## 2015-09-01 NOTE — Progress Notes (Signed)
Internal Medicine Clinic Attending  Case discussed with Dr. Burns at the time of the visit.  We reviewed the resident's history and exam and pertinent patient test results.  I agree with the assessment, diagnosis, and plan of care documented in the resident's note.  

## 2015-09-04 ENCOUNTER — Telehealth: Payer: Self-pay

## 2015-09-04 NOTE — Telephone Encounter (Signed)
Needs to speak with a nurse regarding meds.  

## 2015-09-05 NOTE — Telephone Encounter (Signed)
No answer, no vmail 

## 2015-09-06 NOTE — Telephone Encounter (Signed)
No answer. No VM

## 2015-09-07 NOTE — Telephone Encounter (Signed)
No answer. No VM

## 2015-09-19 ENCOUNTER — Encounter: Payer: Medicaid Other | Admitting: Internal Medicine

## 2015-10-07 ENCOUNTER — Emergency Department (HOSPITAL_COMMUNITY)
Admission: EM | Admit: 2015-10-07 | Discharge: 2015-10-07 | Disposition: A | Discharge status: Home / Self Care | Payer: Medicaid Other | Attending: Emergency Medicine | Admitting: Emergency Medicine

## 2015-10-07 ENCOUNTER — Encounter (HOSPITAL_COMMUNITY): Payer: Self-pay | Admitting: *Deleted

## 2015-10-07 ENCOUNTER — Emergency Department (HOSPITAL_COMMUNITY): Payer: Medicaid Other

## 2015-10-07 DIAGNOSIS — S99912A Unspecified injury of left ankle, initial encounter: Secondary | ICD-10-CM | POA: Diagnosis present

## 2015-10-07 DIAGNOSIS — Y999 Unspecified external cause status: Secondary | ICD-10-CM | POA: Diagnosis not present

## 2015-10-07 DIAGNOSIS — N183 Chronic kidney disease, stage 3 (moderate): Secondary | ICD-10-CM | POA: Insufficient documentation

## 2015-10-07 DIAGNOSIS — Z87891 Personal history of nicotine dependence: Secondary | ICD-10-CM | POA: Insufficient documentation

## 2015-10-07 DIAGNOSIS — S93402A Sprain of unspecified ligament of left ankle, initial encounter: Secondary | ICD-10-CM | POA: Insufficient documentation

## 2015-10-07 DIAGNOSIS — Y92007 Garden or yard of unspecified non-institutional (private) residence as the place of occurrence of the external cause: Secondary | ICD-10-CM | POA: Insufficient documentation

## 2015-10-07 DIAGNOSIS — Z79899 Other long term (current) drug therapy: Secondary | ICD-10-CM | POA: Diagnosis not present

## 2015-10-07 DIAGNOSIS — X500XXA Overexertion from strenuous movement or load, initial encounter: Secondary | ICD-10-CM | POA: Insufficient documentation

## 2015-10-07 DIAGNOSIS — M25572 Pain in left ankle and joints of left foot: Secondary | ICD-10-CM

## 2015-10-07 DIAGNOSIS — I129 Hypertensive chronic kidney disease with stage 1 through stage 4 chronic kidney disease, or unspecified chronic kidney disease: Secondary | ICD-10-CM | POA: Insufficient documentation

## 2015-10-07 DIAGNOSIS — Y939 Activity, unspecified: Secondary | ICD-10-CM | POA: Insufficient documentation

## 2015-10-07 MED ORDER — IBUPROFEN 600 MG PO TABS: 600.0000 mg | ORAL_TABLET | Freq: Four times a day (QID) | ORAL | 0 refills | Status: DC | PRN

## 2015-10-07 NOTE — ED Provider Notes (Signed)
[facility] DEPT Provider Note   CSN: [id number] Arrival date & time: 10/07/15  2006     History   Chief Complaint Chief Complaint  Patient presents with  . Ankle Pain    HPI Kyle Montgomery is a 40 y.o. male.  Patient presents to the ED with a chief complaint of left ankle pain.  He reports that he was mowing the lawn 2 days ago and stepped in a hole causing him to roll his ankle.  He reports increased pain with ambulation and palpation.  He reports associated swelling.  He has not tried taking anything for his symptoms.   There are no other associated symptoms.   The history is provided by the patient. No language interpreter was used.    Past Medical History:  Diagnosis Date  . Bipolar affective (HCC)    followed by mental health  . Gynecomastia 02/07   normal pituitary by MRI  . 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 Hypertension   . Renal insufficiency    baseline Cr 2.2 - renal US with increased Echo signal, no nephrology eval, no bx    Patient Active Problem List   Diagnosis Date Noted  . Hyperlipidemia 09/06/2014  . Healthcare maintenance 12/01/2012  . CKD (chronic kidney disease), stage III 09/01/2012  . Bipolar disorder (HCC) 11/06/2005  . Essential hypertension 11/06/2005    History reviewed. No pertinent surgical history.     Home Medications    Prior to Admission medications   Medication Sig Start Date End Date Taking? Authorizing Provider  acetaminophen (TYLENOL) 325 MG tablet Take 2 tablets (650 mg total) by mouth every 6 (six) hours as needed for mild pain or moderate pain. 03/30/15   Everlene FarrierWilliam Dansie, PA-C  amLODipine (NORVASC) 10 MG tablet TAKE 1 TABLET (10 MG TOTAL) BY MOUTH DAILY. 03/23/15   Carly Arlyce HarmanJ Rivet, MD  atorvastatin (LIPITOR) 20 MG tablet Take 1 tablet (20 mg total) by mouth daily. 07/13/15   Su Hoffarly J Rivet, MD  divalproex (DEPAKOTE ER) 500 MG 24 hr tablet Take 1 tablet (500 mg total) by mouth daily.  03/14/15   Carly Arlyce HarmanJ Rivet, MD  lisinopril (PRINIVIL,ZESTRIL) 40 MG tablet Take 1 tablet (40 mg total) by mouth daily. 08/29/15 10/13/16  Alexa Lucrezia Starch Burns, MD  ziprasidone (GEODON) 80 MG capsule Take 1 capsule (80 mg total) by mouth 2 (two) times daily with a meal. 03/14/15   Su Hoffarly J Rivet, MD    Family History Family History  Problem Relation Age of Onset  . Hyperlipidemia Mother   . Hypertension Mother   . Atrial fibrillation Mother   . Hypertension Brother   . Hypertension Maternal Grandmother   . Cancer Maternal Grandmother     Social History Social History  Substance Use Topics  . Smoking status: Former Smoker    Types: Cigarettes  . Smokeless tobacco: Never Used  . Alcohol use No     Allergies   Review of patient's allergies indicates no known allergies.   Review of Systems Review of Systems  Constitutional: Negative for fever.  Musculoskeletal: Positive for arthralgias, gait problem and joint swelling.  Skin: Negative for color change, rash and wound.  Neurological: Negative for weakness and numbness.     Physical Exam Updated Vital Signs BP 144/96   Pulse 90   Temp 99 F (37.2 C)   Resp 16   Ht 5\' 10"  (1.778 m)   Wt 103 kg   SpO2 97%  BMI 32.57 kg/m   Physical Exam Physical Exam  Constitutional: Pt appears well-developed and well-nourished. No distress.  HENT:  Head: Normocephalic and atraumatic.  Eyes: Conjunctivae are normal.  Neck: Normal range of motion.  Cardiovascular: Normal rate, regular rhythm and intact distal pulses.   Capillary refill < 3 sec  Pulmonary/Chest: Effort normal and breath sounds normal.  Musculoskeletal: Pt exhibits tenderness to palpation over the left lateral aspect, no bony abnormality or deformity. Pt exhibits no edema.  ROM: Left ankle 4/5 limited by pain  Neurological: Pt  is alert. Coordination normal.  Sensation 5/5 Strength 4/5 limited by pain  Skin: Skin is warm and dry. Pt is not diaphoretic.  No tenting of the  skin No erythema, cellulitis, or sign of septic joint  Psychiatric: Pt has a normal mood and affect.  Nursing note and vitals reviewed.   ED Treatments / Results  Labs (all labs ordered are listed, but only abnormal results are displayed) Labs Reviewed - No data to display  EKG  EKG Interpretation None       Radiology No results found.  Procedures Procedures (including critical care time)  Medications Ordered in ED Medications - No data to display   Initial Impression / Assessment and Plan / ED Course  I have reviewed the triage vital signs and the nursing notes.  Pertinent labs & imaging results that were available during my care of the patient were reviewed by me and considered in my medical decision making (see chart for details).  Clinical Course    Patient with left ankle sprain.  Plain films are negative.  Will treat with ASO and NSAIDs.  PCP follow-up.  Final Clinical Impressions(s) / ED Diagnoses   Final diagnoses:  Left ankle pain  Left ankle sprain, initial encounter    New Prescriptions New Prescriptions   IBUPROFEN (ADVIL,MOTRIN) 600 MG TABLET    Take 1 tablet (600 mg total) by mouth every 6 (six) hours as needed.     Roxy Horseman, PA-C 10/07/15 2109    Donnetta Hutching, MD 10/07/15 2201

## 2015-10-07 NOTE — ED Triage Notes (Signed)
The pt is c/o pain in his lt ankle he injured it 3-4 days ago working out in the yard

## 2015-10-07 NOTE — ED Notes (Signed)
Pt st's he turned his ankle over while mowing a yard 3 or 4 days ago.  Pt c/o pain to left ankle.

## 2015-10-07 NOTE — Progress Notes (Signed)
Orthopedic Tech Progress Note Patient Details:  Karenann CaiJohn L Devino 1975/05/13 [id number]  Ortho Devices Type of Ortho Device: Crutches, ASO Ortho Device/Splint Interventions: Application   Saul FordyceJennifer C Kassey Laforest 10/07/2015, 9:33 PM

## 2015-11-16 ENCOUNTER — Other Ambulatory Visit: Payer: Self-pay | Admitting: *Deleted

## 2015-11-16 ENCOUNTER — Ambulatory Visit (INDEPENDENT_AMBULATORY_CARE_PROVIDER_SITE_OTHER): Payer: Medicaid Other | Admitting: Internal Medicine

## 2015-11-16 VITALS — BP 179/106 | HR 80 | Temp 98.6°F | Ht 69.5 in | Wt 223.0 lb

## 2015-11-16 DIAGNOSIS — Z87891 Personal history of nicotine dependence: Secondary | ICD-10-CM

## 2015-11-16 DIAGNOSIS — Z79899 Other long term (current) drug therapy: Secondary | ICD-10-CM | POA: Diagnosis not present

## 2015-11-16 DIAGNOSIS — I10 Essential (primary) hypertension: Secondary | ICD-10-CM | POA: Diagnosis present

## 2015-11-16 DIAGNOSIS — E7849 Other hyperlipidemia: Secondary | ICD-10-CM

## 2015-11-16 MED ORDER — AMLODIPINE BESYLATE 10 MG PO TABS: ORAL_TABLET | ORAL | 1 refills | Status: DC

## 2015-11-16 MED ORDER — ATORVASTATIN CALCIUM 20 MG PO TABS: 20.0000 mg | ORAL_TABLET | Freq: Every day | ORAL | 3 refills | Status: DC

## 2015-11-16 MED ORDER — LISINOPRIL-HYDROCHLOROTHIAZIDE 20-12.5 MG PO TABS: 2.0000 | ORAL_TABLET | Freq: Every day | ORAL | 1 refills | Status: DC

## 2015-11-16 NOTE — Telephone Encounter (Signed)
Please get him in for an appointment with [facility]. He really just needs a BP recheck and adjustment of BP meds. I am fine to refill all of his medications but need to know how his blood pressure is doing. Thanks!

## 2015-11-16 NOTE — Telephone Encounter (Signed)
Pt presents to front desk wanting all medicines refilled today, at last visit in aug he was to come back for f/u increased bp meds in 4 weeks, he did not. We have open [facility] appt this pm and he can stay, would you like for him to have labs and eval or just refill meds, his pcp has no open slots til feb 2018 Please advise, sending to attending and pcpp

## 2015-11-16 NOTE — Patient Instructions (Addendum)
[name] it was nice meeting you today.  -STOP taking Lisinopril 40 mg daily  -Start taking Lisinopril-Hydrochlorothiazide 20-12.5 mg: 2 tablets by mouth daily  -Continue taking Amlodipine 10 mg daily    -Return for a follow-up visit in 1 month.

## 2015-11-19 NOTE — Progress Notes (Signed)
   CC: Patient is here to discuss his HTN.   HPI:  Kyle Montgomery is a 40 y.o. M with a PMHx of conditions listed below presenting to the clinic to discuss his HTN. Please see problem based charting for the status of the patient's current and chronic medical conditions.   Past Medical History:  Diagnosis Date  . Bipolar affective (HCC)    followed by mental health  . Gynecomastia 02/07   normal pituitary by MRI  . 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 Hypertension   . Renal insufficiency    baseline Cr 2.2 - renal US with increased Echo signal, no nephrology eval, no bx    Review of Systems:  Pertinent positives mentioned in HPI. Remainder of all ROS negative.   Physical Exam:  Vitals:   11/16/15 1629  BP: (!) 179/106  Pulse: 80  Temp: 98.6 F (37 C)  TempSrc: Oral  SpO2: 100%  Weight: 101.2 kg (223 lb)  Height: 5' 9.5" (1.765 m)   Physical Exam  Constitutional: He is oriented to person, place, and time. He appears well-developed and well-nourished. No distress.  HENT:  Head: Normocephalic and atraumatic.  Mouth/Throat: Oropharynx is clear and moist.  Eyes: EOM are normal.  Neck: Normal range of motion. Neck supple. No tracheal deviation present.  Cardiovascular: Normal rate, regular rhythm and intact distal pulses.   Pulmonary/Chest: Effort normal and breath sounds normal. No respiratory distress.  Abdominal: Soft. Bowel sounds are normal. He exhibits no distension. There is no tenderness.  Musculoskeletal: Normal range of motion. He exhibits no edema.  Neurological: He is alert and oriented to person, place, and time.  Skin: Skin is warm and dry.    Assessment & Plan:   See Encounters Tab for problem based charting.  Patient discussed with Dr. Josem KaufmannKlima

## 2015-11-19 NOTE — Assessment & Plan Note (Signed)
BP Readings from Last 3 Encounters:  11/16/15 (!) 179/106  10/07/15 144/96  08/29/15 (!) 139/93    Lab Results  Component Value Date   NA 144 03/14/2015   K 3.8 03/14/2015   CREATININE 1.70 (H) 03/14/2015    Assessment: Blood pressure control:  above goal (<140/90) Progress toward BP goal:   deteriorated  Comments: Current medication regimen includes lisinopril 40 mg daily and amlodipine 10 mg daily. Reports compliance to his medications and reports watching his dietary sodium intake. States he has been exercising regularly; lost 4 lbs since last vision on 10/07/15. States he has been checking his BP at a local fire station - avg values 130s-140s/ 80s-90s; highest in the 170s/ 90s. Cr checked in 02/2015 was 1.7 (baseline 1.7-1.8).   Plan: Medications:  -Discontinue lisinopril 40 mg daily -Start lisinopril-HCTZ 20-12.5 mg 2 tablets daily -Continue amlodipine 10 mg daily  Educational resources provided:   Educated patient about healthy eating and exercise. Emphasized the importance of weight loss.  Other plans:  -RTC in 4 weeks

## 2015-11-20 NOTE — Progress Notes (Signed)
Case discussed with Dr. Rathore at the time of the visit.  We reviewed the resident's history and exam and pertinent patient test results.  I agree with the assessment, diagnosis, and plan of care documented in the resident's note. 

## 2016-02-26 ENCOUNTER — Encounter (INDEPENDENT_AMBULATORY_CARE_PROVIDER_SITE_OTHER): Payer: Self-pay

## 2016-02-26 ENCOUNTER — Ambulatory Visit (INDEPENDENT_AMBULATORY_CARE_PROVIDER_SITE_OTHER): Payer: Medicaid Other | Admitting: Pulmonary Disease

## 2016-02-26 DIAGNOSIS — Y9301 Activity, walking, marching and hiking: Secondary | ICD-10-CM

## 2016-02-26 DIAGNOSIS — S93411A Sprain of calcaneofibular ligament of right ankle, initial encounter: Secondary | ICD-10-CM

## 2016-02-26 DIAGNOSIS — S93409A Sprain of unspecified ligament of unspecified ankle, initial encounter: Secondary | ICD-10-CM | POA: Insufficient documentation

## 2016-02-26 DIAGNOSIS — Y93H9 Activity, other involving exterior property and land maintenance, building and construction: Secondary | ICD-10-CM

## 2016-02-26 NOTE — Patient Instructions (Signed)
Ice - Apply a cold gel pack, bag of ice, or bag of frozen vegetables on your ankle every 1 to 2 hours, for 15 minutes each time. Put a thin towel between the ice (or other cold object) and your skin. Use the ice (or other cold object) for at least 6 hours after your injury. Some people find it helpful to ice longer, even up to 2 days after their injury.  Compression - Compression basically means pressure. You want to have your ankle under slight pressure by having it wrapped in an elastic "compression" bandage. This helps reduce swelling and supports the ankle. It's important that you do not use too much pressure and cut off the blood flow to your foot.  Elevation - "Elevation" means you should keep your foot raised up above the level of your heart. To do this, you can put your foot on some pillows or blankets while you are lying down, or on a table or chair while you are sitting.  Take your prescription ibuprofen 3 times a day for the next 7 to 10 days.  Buy shoes that have better ankle support so you don't roll your ankle as easily.   Do foot exercises: Circle the foot at the ankle, moving the foot up and down. Perform 20 rotations clockwise, then 20 rotations counterclockwise. Do this twice a day.

## 2016-02-26 NOTE — Assessment & Plan Note (Signed)
Assessment: Pain that started 3 weeks ago in the right ankle after inverting his foot. Located over calcaneofibular ligament. No redness or edema. Unclear history because he claims to have come to the ED here recently but the only visit I see is from September for same problem.  Plan: Ice Ankle compression - will try through home health supplier but also instructed him that he can buy this over the counter at a pharmacy or use ACE wrap Exercises given Ibuprofen three times a day for next 7-10 days Follow up in 6-8 weeks

## 2016-02-26 NOTE — Progress Notes (Signed)
   CC: right ankle pain  HPI:  [name] is a 41 y.o. man with history as noted below presenting with right ankle pain.  He has sharp pain in his right ankle for the last 3 weeks. He was cutting grass, went up a hill and twisted his ankle. His left ankle pain has resolved. He is taking ibuprofen 600mg . He takes it once a day. His pain seems to have gotten worse. He is able to walk.    Past Medical History:  Diagnosis Date  . Bipolar affective (HCC)    followed by mental health  . Gynecomastia 02/07   normal pituitary by MRI  . 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 Hypertension   . Renal insufficiency    baseline Cr 2.2 - renal US with increased Echo signal, no nephrology eval, no bx    Review of Systems:   No fevers or chills.   Physical Exam:  Vitals:   02/26/16 1547  BP: (!) 144/82  Pulse: 85  Temp: 98.2 F (36.8 C)  TempSrc: Oral  SpO2: 100%  Weight: 211 lb (95.7 kg)  Height: 5\' 10"  (1.778 m)   General Apperance: NAD HEENT: Normocephalic, atraumatic, anicteric sclera Neck: Supple, trachea midline Lungs: Clear to auscultation bilaterally. No wheezes, rhonchi or rales. Breathing comfortably Heart: Regular rate and rhythm, no murmur/rub/gallop Abdomen: Soft, nontender, nondistended, no rebound/guarding Extremities: Warm and well perfused, no edema. Tender to palpation posterior and inferior to lateral malleolus. Pain with dorsiflexion and plantarflexion but strength intact.  Skin: No rashes or lesions Neurologic: Alert and interactive. No gross deficits.   Assessment & Plan:   See Encounters Tab for problem based charting.  Patient discussed with Dr. Cleda DaubE. Hoffman

## 2016-02-27 NOTE — Progress Notes (Signed)
[facility] Attending  Case discussed with Dr. Krall at the time of the visit.  We reviewed the resident's history and exam and pertinent patient test results.  I agree with the assessment, diagnosis, and plan of care documented in the resident's note.  

## 2016-03-21 ENCOUNTER — Other Ambulatory Visit: Payer: Self-pay | Admitting: Internal Medicine

## 2016-03-21 DIAGNOSIS — F3170 Bipolar disorder, currently in remission, most recent episode unspecified: Secondary | ICD-10-CM

## 2016-03-22 NOTE — Telephone Encounter (Signed)
divalproex (DEPAKOTE ER) 500 MG 24 hr tablet, refill request. Please call pt back.

## 2016-03-22 NOTE — Telephone Encounter (Signed)
Message sent to front office to schedule pt an appt. 

## 2016-03-22 NOTE — Telephone Encounter (Signed)
Patient needs appointment for next month

## 2016-03-31 ENCOUNTER — Encounter (HOSPITAL_COMMUNITY): Payer: Self-pay

## 2016-03-31 ENCOUNTER — Emergency Department (HOSPITAL_COMMUNITY)
Admission: EM | Admit: 2016-03-31 | Discharge: 2016-03-31 | Disposition: A | Discharge status: Home / Self Care | Payer: Medicaid Other | Attending: Emergency Medicine | Admitting: Emergency Medicine

## 2016-03-31 ENCOUNTER — Emergency Department (HOSPITAL_COMMUNITY): Payer: Medicaid Other

## 2016-03-31 DIAGNOSIS — Z79899 Other long term (current) drug therapy: Secondary | ICD-10-CM | POA: Insufficient documentation

## 2016-03-31 DIAGNOSIS — Z87891 Personal history of nicotine dependence: Secondary | ICD-10-CM | POA: Insufficient documentation

## 2016-03-31 DIAGNOSIS — X501XXA Overexertion from prolonged static or awkward postures, initial encounter: Secondary | ICD-10-CM | POA: Diagnosis not present

## 2016-03-31 DIAGNOSIS — Y999 Unspecified external cause status: Secondary | ICD-10-CM | POA: Diagnosis not present

## 2016-03-31 DIAGNOSIS — Y939 Activity, unspecified: Secondary | ICD-10-CM | POA: Insufficient documentation

## 2016-03-31 DIAGNOSIS — I10 Essential (primary) hypertension: Secondary | ICD-10-CM | POA: Diagnosis not present

## 2016-03-31 DIAGNOSIS — Y929 Unspecified place or not applicable: Secondary | ICD-10-CM | POA: Diagnosis not present

## 2016-03-31 DIAGNOSIS — M25552 Pain in left hip: Secondary | ICD-10-CM | POA: Insufficient documentation

## 2016-03-31 MED ORDER — IBUPROFEN 600 MG PO TABS: 600.0000 mg | ORAL_TABLET | Freq: Four times a day (QID) | ORAL | 0 refills | Status: DC | PRN

## 2016-03-31 MED ORDER — TRAMADOL HCL 50 MG PO TABS: 50.0000 mg | ORAL_TABLET | Freq: Four times a day (QID) | ORAL | 0 refills | Status: DC | PRN

## 2016-03-31 MED ORDER — KETOROLAC TROMETHAMINE 15 MG/ML IJ SOLN
15.0000 mg | Freq: Once | INTRAMUSCULAR | Status: AC
  Administered 2016-03-31: 15 mg via INTRAMUSCULAR
  Filled 2016-03-31: qty 1

## 2016-03-31 MED ORDER — HYDROMORPHONE HCL 2 MG/ML IJ SOLN
1.0000 mg | Freq: Once | INTRAMUSCULAR | Status: AC
  Administered 2016-03-31: 1 mg via INTRAMUSCULAR
  Filled 2016-03-31: qty 1

## 2016-03-31 NOTE — ED Provider Notes (Signed)
[facility] Provider Note   CSN: [id number] Arrival date & time: 03/31/16  1859  By signing my name below, I, Kyle Montgomery, attest that this documentation has been prepared under the direction and in the presence of Raeford Razor, MD. Electronically Signed: Rosario Montgomery, ED Scribe. 03/31/16. 8:31 PM.  History   Chief Complaint Chief Complaint  Patient presents with  . Hip Pain   The history is provided by the patient. No language interpreter was used.   HPI Comments: Kyle Montgomery is a 41 y.o. male with a PMHx of HTN, CKD stage III, who presents to the Emergency Department complaining of sudden onset, gradually worsening, persistent left hip pain beginning this morning. No radiation of pain. Pt reports that yesterday he was in bed when he twisted his left leg in an awkward position when his pain began. No recent falls, injury, or trauma to the hip otherwise. He has been minimally weight bearing and ambulatory since this incident secondary to this exacerbating his pain. Pt's pain is alleviated at rest and without moving the extremity. Pt has been taking 500mg  Tylenol without significant relief of his pain. He denies numbness, paraesthesias, weakness, or any other associated symptoms.   Past Medical History:  Diagnosis Date  . Bipolar affective (HCC)    followed by mental health  . Gynecomastia 02/07   normal pituitary by MRI  . 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 Hypertension   . Renal insufficiency    baseline Cr 2.2 - renal US with increased Echo signal, no nephrology eval, no bx   Patient Active Problem List   Diagnosis Date Noted  . Sprain of ankle 02/26/2016  . Hyperlipidemia 09/06/2014  . Healthcare maintenance 12/01/2012  . CKD (chronic kidney disease), stage III 09/01/2012  . Bipolar disorder (HCC) 11/06/2005  . Essential hypertension 11/06/2005   History reviewed. No pertinent surgical history.  Home  Medications    Prior to Admission medications   Medication Sig Start Date End Date Taking? Authorizing Provider  acetaminophen (TYLENOL) 500 MG tablet Take 500-1,000 mg by mouth every 8 (eight) hours as needed for headache (pain).    Yes Historical Provider, MD  amLODipine (NORVASC) 10 MG tablet TAKE 1 TABLET (10 MG TOTAL) BY MOUTH DAILY. Patient taking differently: Take 10 mg by mouth daily.  11/16/15  Yes Newton Giovanni, MD  divalproex (DEPAKOTE ER) 500 MG 24 hr tablet take 1 tablet by mouth daily Patient taking differently: take 1 tablet by mouth daily at bedtime 03/22/16  Yes Carly J Rivet, MD  lisinopril-hydrochlorothiazide (ZESTORETIC) 20-12.5 MG tablet Take 2 tablets by mouth daily. 11/16/15 11/15/16 Yes Raiyan Giovanni, MD  ziprasidone (GEODON) 80 MG capsule Take 1 capsule (80 mg total) by mouth 2 (two) times daily with a meal. Patient taking differently: Take 80 mg by mouth at bedtime.  03/14/15  Yes Carly Arlyce Harman, MD  acetaminophen (TYLENOL) 325 MG tablet Take 2 tablets (650 mg total) by mouth every 6 (six) hours as needed for mild pain or moderate pain. Patient not taking: Reported on 03/31/2016 03/30/15   Everlene Farrier, PA-C  atorvastatin (LIPITOR) 20 MG tablet Take 1 tablet (20 mg total) by mouth daily. Patient not taking: Reported on 03/31/2016 11/16/15   Doneen Poisson, MD  ibuprofen (ADVIL,MOTRIN) 600 MG tablet Take 1 tablet (600 mg total) by mouth every 6 (six) hours as needed. Patient not taking: Reported on 03/31/2016 10/07/15   Roxy Horseman, PA-C   Family  History Family History  Problem Relation Age of Onset  . Hyperlipidemia Mother   . Hypertension Mother   . Atrial fibrillation Mother   . Hypertension Brother   . Hypertension Maternal Grandmother   . Cancer Maternal Grandmother    Social History Social History  Substance Use Topics  . Smoking status: Former Smoker    Types: Cigarettes  . Smokeless tobacco: Never Used  . Alcohol use No   Allergies   Patient  has no known allergies.  Review of Systems Review of Systems  Musculoskeletal: Positive for arthralgias (left hip), gait problem and myalgias.  Neurological: Negative for weakness and numbness.  All other systems reviewed and are negative.  Physical Exam Updated Vital Signs BP 124/81   Pulse 82   Temp 99.3 F (37.4 C) (Oral)   Resp 16   SpO2 95%   Physical Exam  Constitutional: He appears well-developed and well-nourished.  HENT:  Head: Normocephalic.  Right Ear: External ear normal.  Left Ear: External ear normal.  Nose: Nose normal.  Eyes: Conjunctivae are normal. Right eye exhibits no discharge. Left eye exhibits no discharge.  Neck: Normal range of motion.  Cardiovascular: Normal rate, regular rhythm and normal heart sounds.   No murmur heard. Pulmonary/Chest: Effort normal and breath sounds normal. No respiratory distress. He has no wheezes. He has no rales.  Abdominal: Soft. There is no tenderness. There is no rebound and no guarding.  Musculoskeletal: He exhibits no edema.  No shortening or malrotation. Palpable DP pulse. Sensation intact to light touch. 5/5 strength to LLE. Significant pain with left lateral hip and left buttock with external rotation of the hip and AB duction.   Neurological: He is alert. No cranial nerve deficit. Coordination normal.  Skin: Skin is warm and dry. No rash noted. No erythema. No pallor.  Psychiatric: He has a normal mood and affect. His behavior is normal.  Nursing note and vitals reviewed.  ED Treatments / Results  DIAGNOSTIC STUDIES: Oxygen Saturation is 99% on RA, normal by my interpretation.   COORDINATION OF CARE: 8:31 PM-Discussed next steps with pt. Pt verbalized understanding and is agreeable with the plan.   Labs (all labs ordered are listed, but only abnormal results are displayed) Labs Reviewed - No data to display  EKG  EKG Interpretation None      Radiology Dg Hip Unilat W Or Wo Pelvis 2-3 Views  Left  Result Date: 03/31/2016 CLINICAL DATA:  Left hip pain/soreness x1 week EXAM: DG HIP (WITH OR WITHOUT PELVIS) 2-3V LEFT COMPARISON:  None. FINDINGS: No fracture or dislocation is seen. Bilateral hip joint spaces are preserved. Visualized bony pelvis appears intact. Apparent sclerotic lesion overlying the left iliac wing chest position on the lateral view and therefore is favored to be related to bowel rather than a true osseous lesion. IMPRESSION: No acute osseus abnormality is seen. Electronically Signed   By: Charline Bills M.D.   On: 03/31/2016 20:00   Procedures Procedures   Medications Ordered in ED Medications - No data to display  Initial Impression / Assessment and Plan / ED Course  I have reviewed the triage vital signs and the nursing notes.  Pertinent labs & imaging results that were available during my care of the patient were reviewed by me and considered in my medical decision making (see chart for details).      Final Clinical Impressions(s) / ED Diagnoses   Final diagnoses:  Left hip pain   New Prescriptions New Prescriptions  No medications on file   I personally preformed the services scribed in my presence. The recorded information has been reviewed is accurate. Raeford RazorStephen Arnulfo Batson, MD.     Raeford RazorStephen Saidi Santacroce, MD 04/11/16 [id number]

## 2016-03-31 NOTE — Discharge Instructions (Signed)
He needs to follow-up with her PCP. Your CT scan today shows a spot in the bone in your left hip. This is probably a benign "bony island." This may potentially be something more serious though. I'm not sure if it is actually causing symptoms, but it is in the area of your pain. Take the prescribed medications as needed for pain.

## 2016-03-31 NOTE — ED Triage Notes (Signed)
Patient complains of pain and soreness to left hip x 1 week, reports he twisted wrong, no fall, no other trauma. Has been taking tylenol with minimal relief

## 2016-04-09 ENCOUNTER — Encounter: Payer: Self-pay | Admitting: Internal Medicine

## 2016-04-09 ENCOUNTER — Ambulatory Visit (INDEPENDENT_AMBULATORY_CARE_PROVIDER_SITE_OTHER): Payer: Medicaid Other | Admitting: Internal Medicine

## 2016-04-09 DIAGNOSIS — M25552 Pain in left hip: Secondary | ICD-10-CM | POA: Diagnosis not present

## 2016-04-09 DIAGNOSIS — Z5189 Encounter for other specified aftercare: Secondary | ICD-10-CM | POA: Diagnosis present

## 2016-04-09 DIAGNOSIS — R9389 Abnormal findings on diagnostic imaging of other specified body structures: Secondary | ICD-10-CM | POA: Insufficient documentation

## 2016-04-09 NOTE — Assessment & Plan Note (Signed)
Patient is here with ER follow up for left hip pain. It was likely 2/2 to improper body mechanics due to his recent right ankle injury. Xray and CT negative for any fractures (showed incidental bone sclerotic area). Pain has improved significantly with ibuprofen and tramadol.  - asked him to stop taking ibuprofen (has CKD) and tramadol and take tylenol prn.

## 2016-04-09 NOTE — Progress Notes (Signed)
   CC: ER follow up for hip pain  HPI:  [name] is a 41 y.o. with pmh as listed below is here for ER follow up for hip pain.  Patient was seen in clinic 02/26/16 after sprain of calcanofibular ligament of right ankle while cutting grass and inverting his foot. Was given RICE therapy along with ibuprofen. Went to ED on 3/11 for left hip pain sudden onset starting that morning.   Xray hip shoed no fractures. CT pelvis was done showing 1.8x1.3x2.4cm sclerotic area on left iliac wing without expansile or destructive changes, thought to represent benign bone island but radiologist recommended screening PSA.  He has been taking 600mg  ibuprofen and tramadol for pain as needed, pain is improving. He has no pain currently.   Patient denies any dysuria, any bony pain, weight change, night sweats, urinary hesitancy   Past Medical History:  Diagnosis Date  . Bipolar affective (HCC)    followed by mental health  . Gynecomastia 02/07   normal pituitary by MRI  . 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 Hypertension   . Renal insufficiency    baseline Cr 2.2 - renal US with increased Echo signal, no nephrology eval, no bx    Review of Systems:   Review of Systems  Constitutional: Negative for chills and fever.  Eyes: Negative for blurred vision and double vision.  Gastrointestinal: Negative for heartburn, nausea and vomiting.  Genitourinary: Negative for dysuria, flank pain, frequency, hematuria and urgency.  Musculoskeletal: Negative for myalgias.  Neurological: Negative for dizziness and headaches.    Physical Exam:  Vitals:   04/09/16 0833  BP: 117/76  Pulse: 95  Temp: 98 F (36.7 C)  TempSrc: Oral  SpO2: 100%  Weight: 210 lb 4.8 oz (95.4 kg)  Height: 5\' 10"  (1.778 m)   Physical Exam  Constitutional: He is oriented to person, place, and time. He appears well-developed and well-nourished.  Cardiovascular: Normal rate and regular rhythm.   Exam reveals no gallop and no friction rub.   No murmur heard. Respiratory: Effort normal and breath sounds normal. No respiratory distress. He has no wheezes.  Musculoskeletal: Normal range of motion. He exhibits no edema or deformity.  No tenderness over hip joints or spine.   Neurological: He is alert and oriented to person, place, and time.     Assessment & Plan:   See Encounters Tab for problem based charting.  Patient discussed with Dr. Cleda DaubE. Hoffman

## 2016-04-09 NOTE — Patient Instructions (Signed)
Stop taking ibuprofen and tramadol.  Take tylenol for pain.

## 2016-04-15 NOTE — Progress Notes (Signed)
Internal Medicine Clinic Attending  Case discussed with Dr. Ahmed at the time of the visit.  We reviewed the resident's history and exam and pertinent patient test results.  I agree with the assessment, diagnosis, and plan of care documented in the resident's note. 

## 2016-04-24 ENCOUNTER — Other Ambulatory Visit: Payer: Self-pay | Admitting: Internal Medicine

## 2016-04-25 ENCOUNTER — Encounter (HOSPITAL_COMMUNITY): Payer: Self-pay | Admitting: *Deleted

## 2016-04-25 ENCOUNTER — Emergency Department (HOSPITAL_COMMUNITY)
Admission: EM | Admit: 2016-04-25 | Discharge: 2016-04-25 | Disposition: A | Discharge status: Home / Self Care | Payer: Medicaid Other | Attending: Emergency Medicine | Admitting: Emergency Medicine

## 2016-04-25 DIAGNOSIS — I129 Hypertensive chronic kidney disease with stage 1 through stage 4 chronic kidney disease, or unspecified chronic kidney disease: Secondary | ICD-10-CM | POA: Diagnosis not present

## 2016-04-25 DIAGNOSIS — M25552 Pain in left hip: Secondary | ICD-10-CM | POA: Insufficient documentation

## 2016-04-25 DIAGNOSIS — Z87891 Personal history of nicotine dependence: Secondary | ICD-10-CM | POA: Diagnosis not present

## 2016-04-25 DIAGNOSIS — N183 Chronic kidney disease, stage 3 (moderate): Secondary | ICD-10-CM | POA: Insufficient documentation

## 2016-04-25 DIAGNOSIS — G8929 Other chronic pain: Secondary | ICD-10-CM | POA: Insufficient documentation

## 2016-04-25 MED ORDER — ACETAMINOPHEN 500 MG PO TABS
1000.0000 mg | ORAL_TABLET | Freq: Once | ORAL | Status: AC
  Administered 2016-04-25: 1000 mg via ORAL
  Filled 2016-04-25: qty 2

## 2016-04-25 NOTE — ED Triage Notes (Signed)
Pt was previously here with left hip pain and put on medications, then went to outpatient clinic and they took him off Ibuprofen and other medication due to him having kidney disease and put him on Tylenol. Pt reports that he was told to come back here to get more pain medicine if it started hurting real bad.

## 2016-04-25 NOTE — Discharge Instructions (Signed)
Take Tylenol 2 tablets every 4 hours as needed for pain. Please follow up with the [facility] outpatient clinic. Call today to schedule an appointment

## 2016-04-25 NOTE — ED Provider Notes (Signed)
[facility]-EMERGENCY DEPT Provider Note   CSN: [id number] Arrival date & time: 04/25/16  1258   By signing my name below, I, Soijett Blue, attest that this documentation has been prepared under the direction and in the presence of Doug Sou, MD. Electronically Signed: Soijett Blue, ED Scribe. 04/25/16. 2:09 PM.  History   Chief Complaint Chief Complaint  Patient presents with  . Hip Pain    HPI Kyle Montgomery is a 41 y.o. male with a PMHx of HTN, who presents to the Emergency Department complaining of 5/10, intermittent, left hip pain onset 2 months ago. Pt has tried tylenol, ibuprofen, tramadol, with no relief of his symptoms. He states that his last dose of medication was  2days ago. Pt left hip pain is exacerbated with prolonged ambulation and alleviated with rest. He states that he was evaluated in the ED 1 month ago for his symptoms and informed that he had a spot to his left hip via CT of his left hip. He reports that he was evaluated by his PCP at the [facility] outpatient clinic and informed to stop using the tramadol And Advil due to his PMHx of renal insufficiency. He denies gait problem, fever, chills, swelling, and any other symptoms. Pt denies smoking cigarettes, ETOH use, or illegal drug use. No fever no trauma  Per pt chart review: Pt was seen in the ED on 03/31/2016 for left hip pain. Pt had left hip xray imaging completed with no acute abnormality. Pt was Rx ibuprofen and tramadol for their symptoms.   The history is provided by the patient. No language interpreter was used.    Past Medical History:  Diagnosis Date  . Bipolar affective (HCC)    followed by mental health  . Gynecomastia 02/07   normal pituitary by MRI  . 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 Hypertension   . Renal insufficiency    baseline Cr 2.2 - renal US with increased Echo signal, no nephrology eval, no bx    Patient Active Problem List   Diagnosis Date Noted    . Hip pain, left 04/09/2016  . Abnormal finding of diagnostic imaging 04/09/2016  . Sprain of ankle 02/26/2016  . Hyperlipidemia 09/06/2014  . Healthcare maintenance 12/01/2012  . CKD (chronic kidney disease), stage III 09/01/2012  . Bipolar disorder (HCC) 11/06/2005  . Essential hypertension 11/06/2005    History reviewed. No pertinent surgical history.     Home Medications    Prior to Admission medications   Medication Sig Start Date End Date Taking? Authorizing Provider  acetaminophen (TYLENOL) 325 MG tablet Take 2 tablets (650 mg total) by mouth every 6 (six) hours as needed for mild pain or moderate pain. Patient not taking: Reported on 03/31/2016 03/30/15   Everlene Farrier, PA-C  acetaminophen (TYLENOL) 500 MG tablet Take 500-1,000 mg by mouth every 8 (eight) hours as needed for headache (pain).     Historical Provider, MD  amLODipine (NORVASC) 10 MG tablet take 1 tablet by mouth daily 04/24/16   Su Hoff, MD  atorvastatin (LIPITOR) 20 MG tablet Take 1 tablet (20 mg total) by mouth daily. Patient not taking: Reported on 03/31/2016 11/16/15   Doneen Poisson, MD  divalproex (DEPAKOTE ER) 500 MG 24 hr tablet take 1 tablet by mouth daily Patient taking differently: take 1 tablet by mouth daily at bedtime 03/22/16   Su Hoff, MD  lisinopril-hydrochlorothiazide (ZESTORETIC) 20-12.5 MG tablet Take 2 tablets by mouth daily. 11/16/15  11/15/16  Naresh Giovanni, MD  ziprasidone (GEODON) 80 MG capsule Take 1 capsule (80 mg total) by mouth 2 (two) times daily with a meal. Patient taking differently: Take 80 mg by mouth at bedtime.  03/14/15   Su Hoff, MD    Family History Family History  Problem Relation Age of Onset  . Hyperlipidemia Mother   . Hypertension Mother   . Atrial fibrillation Mother   . Hypertension Brother   . Hypertension Maternal Grandmother   . Cancer Maternal Grandmother     Social History Social History  Substance Use Topics  . Smoking status:  Former Smoker    Types: Cigarettes  . Smokeless tobacco: Never Used  . Alcohol use No     Allergies   Patient has no known allergies.   Review of Systems Review of Systems  Constitutional: Negative.  Negative for fever.  HENT: Negative.   Musculoskeletal: Positive for arthralgias (left hip). Negative for gait problem and joint swelling.  Skin: Negative.   Neurological: Negative.   Psychiatric/Behavioral: Negative.   All other systems reviewed and are negative.    Physical Exam Updated Vital Signs BP (!) 136/94 (BP Location: Right Arm)   Pulse 90   Temp 97.7 F (36.5 C) (Oral)   Resp 18   Ht  (1.778 m)   Wt 210 lb (95.3 kg)   SpO2 100%   BMI 30.13 kg/m   Physical Exam  Constitutional: He appears well-developed and well-nourished.  HENT:  Head: Normocephalic and atraumatic.  Poor dentition  Eyes: Conjunctivae are normal.  Neck: Neck supple.  Cardiovascular: Normal rate.   Pulmonary/Chest: Effort normal.  Abdominal: Soft. He exhibits no distension.  Musculoskeletal: Normal range of motion.       Left hip: He exhibits normal range of motion, no tenderness and no bony tenderness.  Left lower extremity hip is unremarkable. No decreased ROM. No tenderness, crepitus, or deformity, noted to left hip. No pain on internal or external rotation of thigh. DP pulse 2+.  Neurological: He is alert.  Gait normal walks without limp  Skin: Skin is warm and dry.  Psychiatric: He has a normal mood and affect. His behavior is normal.  Nursing note and vitals reviewed.    ED Treatments / Results  DIAGNOSTIC STUDIES: Oxygen Saturation is 100% on RA, nl by my interpretation.    COORDINATION OF CARE: 2:07 PM Discussed treatment plan with pt and family at bedside which includes tylenol and pt and family agreed to plan.  Procedures Procedures (including critical care time)  Medications Ordered in ED Medications - No data to display   Initial Impression / Assessment and  Plan / ED Course  I have reviewed the triage vital signs and the nursing notes.   Plan Tylenol for pain. Follow-up with [facility] outpatient [facility].  Final Clinical Impressions(s) / ED Diagnoses  Diagnosis chronic left hip pain Final diagnoses:  None    New Prescriptions New Prescriptions   No medications on file   I personally performed the services described in this documentation, which was scribed in my presence. The recorded information has been reviewed and considered.     Doug Sou, MD 04/25/16 1416

## 2016-05-14 ENCOUNTER — Encounter: Payer: Medicaid Other | Admitting: Internal Medicine

## 2016-05-22 ENCOUNTER — Other Ambulatory Visit: Payer: Self-pay | Admitting: Internal Medicine

## 2016-05-22 DIAGNOSIS — F3170 Bipolar disorder, currently in remission, most recent episode unspecified: Secondary | ICD-10-CM

## 2016-05-22 DIAGNOSIS — E7849 Other hyperlipidemia: Secondary | ICD-10-CM

## 2016-05-22 MED ORDER — DIVALPROEX SODIUM ER 500 MG PO TB24: 500.0000 mg | ORAL_TABLET | Freq: Every day | ORAL | 0 refills | Status: DC

## 2016-05-22 MED ORDER — ZIPRASIDONE HCL 80 MG PO CAPS: 80.0000 mg | ORAL_CAPSULE | Freq: Two times a day (BID) | ORAL | 0 refills | Status: DC

## 2016-05-22 MED ORDER — AMLODIPINE BESYLATE 10 MG PO TABS: 10.0000 mg | ORAL_TABLET | Freq: Every day | ORAL | 1 refills | Status: DC

## 2016-05-22 MED ORDER — ATORVASTATIN CALCIUM 20 MG PO TABS: 20.0000 mg | ORAL_TABLET | Freq: Every day | ORAL | 0 refills | Status: DC

## 2016-05-22 NOTE — Telephone Encounter (Signed)
appt 5/08 with pcp

## 2016-05-22 NOTE — Telephone Encounter (Signed)
Patient called back for refills, refill request routed to PCP

## 2016-05-22 NOTE — Telephone Encounter (Signed)
Please call pt back about meds.  

## 2016-05-28 ENCOUNTER — Encounter: Payer: Medicaid Other | Admitting: Internal Medicine

## 2016-06-06 ENCOUNTER — Encounter (HOSPITAL_COMMUNITY): Payer: Self-pay

## 2016-06-06 ENCOUNTER — Emergency Department (HOSPITAL_COMMUNITY): Payer: Medicaid Other

## 2016-06-06 ENCOUNTER — Emergency Department (HOSPITAL_COMMUNITY)
Admission: EM | Admit: 2016-06-06 | Discharge: 2016-06-06 | Disposition: A | Discharge status: Home / Self Care | Payer: Medicaid Other | Attending: Emergency Medicine | Admitting: Emergency Medicine

## 2016-06-06 DIAGNOSIS — I129 Hypertensive chronic kidney disease with stage 1 through stage 4 chronic kidney disease, or unspecified chronic kidney disease: Secondary | ICD-10-CM | POA: Insufficient documentation

## 2016-06-06 DIAGNOSIS — M25532 Pain in left wrist: Secondary | ICD-10-CM | POA: Diagnosis present

## 2016-06-06 DIAGNOSIS — Z87891 Personal history of nicotine dependence: Secondary | ICD-10-CM | POA: Insufficient documentation

## 2016-06-06 DIAGNOSIS — Z79899 Other long term (current) drug therapy: Secondary | ICD-10-CM | POA: Insufficient documentation

## 2016-06-06 DIAGNOSIS — N183 Chronic kidney disease, stage 3 (moderate): Secondary | ICD-10-CM | POA: Diagnosis not present

## 2016-06-06 MED ORDER — ACETAMINOPHEN 325 MG PO TABS
650.0000 mg | ORAL_TABLET | Freq: Once | ORAL | Status: AC
  Administered 2016-06-06: 650 mg via ORAL
  Filled 2016-06-06: qty 2

## 2016-06-06 MED ORDER — PREDNISONE 10 MG PO TABS: 40.0000 mg | ORAL_TABLET | Freq: Every day | ORAL | 0 refills | Status: AC

## 2016-06-06 MED ORDER — PREDNISONE 20 MG PO TABS
40.0000 mg | ORAL_TABLET | Freq: Once | ORAL | Status: AC
  Administered 2016-06-06: 40 mg via ORAL
  Filled 2016-06-06: qty 2

## 2016-06-06 NOTE — ED Notes (Signed)
PA in room with patient at this time. 

## 2016-06-06 NOTE — Discharge Instructions (Signed)
Take prednisone 40 mg beginning tomorrow for 4 days. Follow-up with PCP for further evaluation if needed. Return to ED for worsening pain, fever, additional injury, numbness, weakness.

## 2016-06-06 NOTE — ED Provider Notes (Signed)
[facility]PT Provider Note   CSN: [id number] Arrival date & time: 06/06/16  1539    By signing my name below, I, Clarisse GougeXavier Herndon, attest that this documentation has been prepared under the direction and in the presence of Nolen Lindamood, PA-C. Electronically Signed: Clarisse GougeXavier Herndon, Scribe. 06/06/16. 4:49 PM.   History   Chief Complaint Chief Complaint  Patient presents with  . Wrist Pain   The history is provided by the patient and medical records. No language interpreter was used.    Kyle Montgomery is a 41 y.o. male with h/o HTN, renal insuficiency and stage 3 CKD, who presents to the Emergency Department with concern for gradually worsening, new L wrist pain onset last night. Mild swelling noted. He believes he may have "slept on it wrong". He describes 10/10, aching, throbbing under-sided L wrist pain shooting toward the elbow, but not affecting it, that is worse with twisting the forearm, contact and moving the fingers. Tylenol provided no relief today. No other modifying factors noted. He notes tramadol and ibuprofen contraindications d/t h/o CKD. No h/o similar symptoms noted. No trauma or injury noted recently.  No numbness or weakness noted. No h/o gout or joint infections noted. No other complaints at this time.   Past Medical History:  Diagnosis Date  . Bipolar affective (HCC)    followed by mental health  . Gynecomastia 02/07   normal pituitary by MRI  . 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 Hypertension   . Renal insufficiency    baseline Cr 2.2 - renal US with increased Echo signal, no nephrology eval, no bx    Patient Active Problem List   Diagnosis Date Noted  . Hip pain, left 04/09/2016  . Abnormal finding of diagnostic imaging 04/09/2016  . Sprain of ankle 02/26/2016  . Hyperlipidemia 09/06/2014  . Healthcare maintenance 12/01/2012  . CKD (chronic kidney disease), stage III 09/01/2012  . Bipolar disorder (HCC) 11/06/2005  .  Essential hypertension 11/06/2005    History reviewed. No pertinent surgical history.     Home Medications    Prior to Admission medications   Medication Sig Start Date End Date Taking? Authorizing Provider  acetaminophen (TYLENOL) 325 MG tablet Take 2 tablets (650 mg total) by mouth every 6 (six) hours as needed for mild pain or moderate pain. Patient not taking: Reported on 03/31/2016 03/30/15   Everlene Farrieransie, William, PA-C  acetaminophen (TYLENOL) 500 MG tablet Take 500-1,000 mg by mouth every 8 (eight) hours as needed for headache (pain).     [provider]  amLODipine (NORVASC) 10 MG tablet Take 1 tablet (10 mg total) by mouth daily. 05/22/16   Rivet, Iris Pertarly J, MD  atorvastatin (LIPITOR) 20 MG tablet Take 1 tablet (20 mg total) by mouth daily. 05/22/16   Rivet, Iris Pertarly J, MD  divalproex (DEPAKOTE ER) 500 MG 24 hr tablet Take 1 tablet (500 mg total) by mouth at bedtime. 05/22/16   Rivet, Iris Pertarly J, MD  lisinopril-hydrochlorothiazide (PRINZIDE,ZESTORETIC) 20-12.5 MG tablet take 2 tablets by mouth daily 05/22/16 05/22/17  Rivet, Iris Pertarly J, MD  predniSONE (DELTASONE) 10 MG tablet Take 4 tablets (40 mg total) by mouth daily. 06/06/16 06/10/16  Shruti Arrey, PA-C  ziprasidone (GEODON) 80 MG capsule Take 1 capsule (80 mg total) by mouth 2 (two) times daily with a meal. 05/22/16   Rivet, Iris Pertarly J, MD    Family History Family History  Problem Relation Age of Onset  . Hyperlipidemia Mother   .  Hypertension Mother   . Atrial fibrillation Mother   . Hypertension Brother   . Hypertension Maternal Grandmother   . Cancer Maternal Grandmother     Social History Social History  Substance Use Topics  . Smoking status: Former Smoker    Types: Cigarettes  . Smokeless tobacco: Never Used  . Alcohol use No     Allergies   Patient has no known allergies.   Review of Systems Review of Systems  Constitutional: Negative for chills and fever.  Respiratory: Negative for shortness of breath.     Gastrointestinal: Negative for nausea and vomiting.  Musculoskeletal: Positive for arthralgias and myalgias.  Skin: Negative for rash and wound.  Neurological: Negative for weakness and numbness.     Physical Exam Updated Vital Signs BP (!) 133/97 (BP Location: Right Arm)   Pulse (!) 101   Temp 98.8 F (37.1 C) (Oral)   Resp 16   SpO2 99%   Physical Exam  Constitutional: He appears well-developed and well-nourished. No distress.  HENT:  Head: Normocephalic and atraumatic.  Eyes: Conjunctivae and EOM are normal. No scleral icterus.  Neck: Normal range of motion.  Pulmonary/Chest: Effort normal. No respiratory distress.  Musculoskeletal: He exhibits edema and tenderness.  Tenderness of the L wrist on the palmar side. Pain with ROM of wrist and digits. No visible wound or trauma. Mild swelling extending to the forearm. Area appears neurovascularly intact.  Neurological: He is alert.  Skin: No rash noted. He is not diaphoretic.  Psychiatric: He has a normal mood and affect.  Nursing note and vitals reviewed.    ED Treatments / Results  DIAGNOSTIC STUDIES: Oxygen Saturation is 99% on RA, NL by my interpretation.    COORDINATION OF CARE: 4:19 PM-Discussed next steps with pt. Pt verbalized understanding and is agreeable with the plan. Will order imaging and medication.   Labs (all labs ordered are listed, but only abnormal results are displayed) Labs Reviewed - No data to display  EKG  EKG Interpretation None       Radiology Dg Wrist Complete Left  Result Date: 06/06/2016 CLINICAL DATA:  Generalized wrist pain.  No specific injury. EXAM: LEFT WRIST - COMPLETE 3+ VIEW COMPARISON:  None. FINDINGS: The radiocarpal and intercarpal joint spaces are maintained. Probable subchondral versus remote osteochondral lesion involving the lunate. No fracture. No chondrocalcinosis. The metacarpal bones appear normal. IMPRESSION: No acute bony findings. Subchondral cystic change versus  osteochondral defect involving the lunate. MRI may be helpful for further evaluation. Electronically Signed   By: Rudie Meyer M.D.   On: 06/06/2016 16:42    Procedures Procedures (including critical care time)  Medications Ordered in ED Medications  predniSONE (DELTASONE) tablet 40 mg (not administered)  acetaminophen (TYLENOL) tablet 650 mg (650 mg Oral Given 06/06/16 1628)     Initial Impression / Assessment and Plan / ED Course  I have reviewed the triage vital signs and the nursing notes.  Pertinent labs & imaging results that were available during my care of the patient were reviewed by me and considered in my medical decision making (see chart for details).     Patient's history and symptoms concerning for inflammatory arthritis vs. Septic joint. There is mild swelling of the left wrist. No temperature color change noted. Area appears neurovascularly intact.  X-ray results negative for fracture or  dislocation. Symptoms could be due to inflammatory arthritis. Septic joint is low likelihood due to normal vital signs, range of motion of painful and no temperature change.  Will give 1 dose of prednisone 40mg  here and will discharged with 40 mg to take for the next 4 days. Patient unable to take NSAIDs due to kidney disease. Will be given volar wrist splint and advised to follow-up with PCP for further evaluation. Return precautions given.  Final Clinical Impressions(s) / ED Diagnoses   Final diagnoses:  Left wrist pain    New Prescriptions New Prescriptions   PREDNISONE (DELTASONE) 10 MG TABLET    Take 4 tablets (40 mg total) by mouth daily.   I personally performed the services described in this documentation, which was scribed in my presence. The recorded information has been reviewed and is accurate.    Dietrich Pates, PA-C 06/06/16 1745    Tilden Fossa, MD 06/06/16 2105

## 2016-06-06 NOTE — ED Notes (Signed)
See EDP secondary assessment.  

## 2016-06-06 NOTE — ED Notes (Signed)
Patient unable to tolerate wrist splint.  PA notified and patient's wrist placed in ace wrap and given instructions.

## 2016-06-06 NOTE — ED Triage Notes (Signed)
Pt reports left wrist pain for two days, mild swelling noted. Pt states "I dont know how it happened. I must have slept on it the wrong way."

## 2016-07-02 ENCOUNTER — Encounter: Payer: Self-pay | Admitting: *Deleted

## 2016-07-10 ENCOUNTER — Encounter (HOSPITAL_COMMUNITY): Payer: Self-pay | Admitting: *Deleted

## 2016-07-10 ENCOUNTER — Emergency Department (HOSPITAL_COMMUNITY)
Admission: EM | Admit: 2016-07-10 | Discharge: 2016-07-10 | Disposition: A | Discharge status: Home / Self Care | Payer: Medicaid Other | Attending: Emergency Medicine | Admitting: Emergency Medicine

## 2016-07-10 DIAGNOSIS — R110 Nausea: Secondary | ICD-10-CM | POA: Insufficient documentation

## 2016-07-10 DIAGNOSIS — Z87891 Personal history of nicotine dependence: Secondary | ICD-10-CM | POA: Insufficient documentation

## 2016-07-10 DIAGNOSIS — E785 Hyperlipidemia, unspecified: Secondary | ICD-10-CM | POA: Diagnosis not present

## 2016-07-10 DIAGNOSIS — Z79899 Other long term (current) drug therapy: Secondary | ICD-10-CM | POA: Insufficient documentation

## 2016-07-10 DIAGNOSIS — N183 Chronic kidney disease, stage 3 (moderate): Secondary | ICD-10-CM | POA: Insufficient documentation

## 2016-07-10 DIAGNOSIS — I129 Hypertensive chronic kidney disease with stage 1 through stage 4 chronic kidney disease, or unspecified chronic kidney disease: Secondary | ICD-10-CM | POA: Diagnosis not present

## 2016-07-10 LAB — URINALYSIS, ROUTINE W REFLEX MICROSCOPIC
BACTERIA UA: NONE SEEN
BILIRUBIN URINE: NEGATIVE
Glucose, UA: NEGATIVE mg/dL
Hgb urine dipstick: NEGATIVE
KETONES UR: NEGATIVE mg/dL
LEUKOCYTES UA: NEGATIVE
NITRITE: NEGATIVE
Protein, ur: 30 mg/dL — AB
RBC / HPF: NONE SEEN RBC/hpf (ref 0–5)
Specific Gravity, Urine: 1.009 (ref 1.005–1.030)
Squamous Epithelial / LPF: NONE SEEN
pH: 6 (ref 5.0–8.0)

## 2016-07-10 LAB — CBC
HEMATOCRIT: 41.1 % (ref 39.0–52.0)
HEMOGLOBIN: 13.5 g/dL (ref 13.0–17.0)
MCH: 27.6 pg (ref 26.0–34.0)
MCHC: 32.8 g/dL (ref 30.0–36.0)
MCV: 83.9 fL (ref 78.0–100.0)
Platelets: 225 10*3/uL (ref 150–400)
RBC: 4.9 MIL/uL (ref 4.22–5.81)
RDW: 13 % (ref 11.5–15.5)
WBC: 8 10*3/uL (ref 4.0–10.5)

## 2016-07-10 LAB — BASIC METABOLIC PANEL
ANION GAP: 10 (ref 5–15)
BUN: 22 mg/dL — ABNORMAL HIGH (ref 6–20)
CO2: 26 mmol/L (ref 22–32)
Calcium: 9.5 mg/dL (ref 8.9–10.3)
Chloride: 103 mmol/L (ref 101–111)
Creatinine, Ser: 1.95 mg/dL — ABNORMAL HIGH (ref 0.61–1.24)
GFR calc Af Amer: 47 mL/min — ABNORMAL LOW (ref >60)
GFR, EST NON AFRICAN AMERICAN: 41 mL/min — AB (ref >60)
GLUCOSE: 123 mg/dL — AB (ref 65–99)
Potassium: 3.3 mmol/L — ABNORMAL LOW (ref 3.5–5.1)
SODIUM: 139 mmol/L (ref 135–145)

## 2016-07-10 LAB — CBG MONITORING, ED: GLUCOSE-CAPILLARY: 112 mg/dL — AB (ref 65–99)

## 2016-07-10 MED ORDER — ONDANSETRON 4 MG PO TBDP
ORAL_TABLET | ORAL | Status: AC
  Filled 2016-07-10: qty 1

## 2016-07-10 MED ORDER — ONDANSETRON HCL 4 MG PO TABS: 4.0000 mg | ORAL_TABLET | Freq: Three times a day (TID) | ORAL | 0 refills | Status: DC | PRN

## 2016-07-10 MED ORDER — ONDANSETRON 4 MG PO TBDP
4.0000 mg | ORAL_TABLET | Freq: Once | ORAL | Status: AC
  Administered 2016-07-10: 4 mg via ORAL

## 2016-07-10 NOTE — ED Provider Notes (Signed)
[facility] Provider Note   CSN: [id number] Arrival date & time: 07/10/16  1654     History   Chief Complaint Chief Complaint  Patient presents with  . Nausea  . Dizziness    HPI Karenann CaiJohn L Herrle is a 41 y.o. male.  HPI  Patient is a 41 year old male who presents to the emergency department for 1 day history of nausea and lightheadedness upon standing. States he felt nauseous earlier today. Denies associated vomiting, diarrhea, abdominal pain, chest pain, shortness of breath. No recent fever or chills. States he has been tolerating liquids and solids without difficulty. Nothing worsens the nausea. He received Zofran ODT upon arriving in triage, states he no longer has nausea. Patient currently without complaints.   Past Medical History:  Diagnosis Date  . Bipolar affective (HCC)    followed by mental health  . Gynecomastia 02/07   normal pituitary by MRI  . 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 Hypertension   . Renal insufficiency    baseline Cr 2.2 - renal US with increased Echo signal, no nephrology eval, no bx    Patient Active Problem List   Diagnosis Date Noted  . Hip pain, left 04/09/2016  . Abnormal finding of diagnostic imaging 04/09/2016  . Sprain of ankle 02/26/2016  . Hyperlipidemia 09/06/2014  . Healthcare maintenance 12/01/2012  . CKD (chronic kidney disease), stage III 09/01/2012  . Bipolar disorder (HCC) 11/06/2005  . Essential hypertension 11/06/2005    History reviewed. No pertinent surgical history.     Home Medications    Prior to Admission medications   Medication Sig Start Date End Date Taking? Authorizing Provider  acetaminophen (TYLENOL) 325 MG tablet Take 2 tablets (650 mg total) by mouth every 6 (six) hours as needed for mild pain or moderate pain. Patient not taking: Reported on 03/31/2016 03/30/15   Everlene Farrieransie, William, PA-C  acetaminophen (TYLENOL) 500 MG tablet Take 500-1,000 mg by mouth every 8  (eight) hours as needed for headache (pain).     [provider]  amLODipine (NORVASC) 10 MG tablet Take 1 tablet (10 mg total) by mouth daily. 05/22/16   Rivet, Iris Pertarly J, MD  atorvastatin (LIPITOR) 20 MG tablet Take 1 tablet (20 mg total) by mouth daily. 05/22/16   Rivet, Iris Pertarly J, MD  divalproex (DEPAKOTE ER) 500 MG 24 hr tablet Take 1 tablet (500 mg total) by mouth at bedtime. 05/22/16   Rivet, Iris Pertarly J, MD  lisinopril-hydrochlorothiazide (PRINZIDE,ZESTORETIC) 20-12.5 MG tablet take 2 tablets by mouth daily 05/22/16 05/22/17  Rivet, Iris Pertarly J, MD  ondansetron (ZOFRAN) 4 MG tablet Take 1 tablet (4 mg total) by mouth every 8 (eight) hours as needed for nausea or vomiting. 07/10/16   Corena HerterMumma, Nyajah Hyson, MD  ziprasidone (GEODON) 80 MG capsule Take 1 capsule (80 mg total) by mouth 2 (two) times daily with a meal. 05/22/16   Rivet, Iris Pertarly J, MD    Family History Family History  Problem Relation Age of Onset  . Hyperlipidemia Mother   . Hypertension Mother   . Atrial fibrillation Mother   . Hypertension Brother   . Hypertension Maternal Grandmother   . Cancer Maternal Grandmother     Social History Social History  Substance Use Topics  . Smoking status: Former Smoker    Types: Cigarettes  . Smokeless tobacco: Never Used  . Alcohol use No     Allergies   Patient has no known allergies.   Review of Systems Review of Systems  Constitutional: Negative for chills and fever.  HENT: Negative for congestion.   Eyes: Negative for visual disturbance.  Respiratory: Negative for chest tightness and shortness of breath.   Cardiovascular: Negative for chest pain and palpitations.  Gastrointestinal: Positive for nausea. Negative for abdominal pain, blood in stool and vomiting.  Genitourinary: Negative for decreased urine volume, dysuria and frequency.  Musculoskeletal: Negative for back pain.  Skin: Negative for rash.  Neurological: Positive for light-headedness. Negative for dizziness, seizures,  weakness and headaches.  Psychiatric/Behavioral: Negative for behavioral problems.     Physical Exam Updated Vital Signs BP (!) 135/91   Pulse 66   Temp 98.6 F (37 C) (Oral)   Resp 18   Ht 5\' 10"  (1.778 m)   Wt 95.3 kg (210 lb)   SpO2 (!) 85%   BMI 30.13 kg/m   Physical Exam  Constitutional: He is oriented to person, place, and time. He appears well-developed and well-nourished.  HENT:  Head: Atraumatic.  Eyes: Conjunctivae and EOM are normal.  Neck: Normal range of motion.  Cardiovascular: Normal rate, regular rhythm, normal heart sounds and intact distal pulses.   Pulmonary/Chest: Effort normal and breath sounds normal. No respiratory distress.  Abdominal: Soft. He exhibits no distension and no mass. There is no tenderness. There is no guarding.  Musculoskeletal: Normal range of motion.  Neurological: He is alert and oriented to person, place, and time.  Skin: Skin is warm. Capillary refill takes less than 2 seconds.  Psychiatric: He has a normal mood and affect.     ED Treatments / Results  Labs (all labs ordered are listed, but only abnormal results are displayed) Labs Reviewed  BASIC METABOLIC PANEL - Abnormal; Notable for the following:       Result Value   Potassium 3.3 (*)    Glucose, Bld 123 (*)    BUN 22 (*)    Creatinine, Ser 1.95 (*)    GFR calc non Af Amer 41 (*)    GFR calc Af Amer 47 (*)    All other components within normal limits  URINALYSIS, ROUTINE W REFLEX MICROSCOPIC - Abnormal; Notable for the following:    Color, Urine STRAW (*)    Protein, ur 30 (*)    All other components within normal limits  CBG MONITORING, ED - Abnormal; Notable for the following:    Glucose-Capillary 112 (*)    All other components within normal limits  CBC    EKG  EKG Interpretation None       Radiology No results found.  Procedures Procedures (including critical care time)  Medications Ordered in ED Medications  ondansetron (ZOFRAN-ODT) 4 MG  disintegrating tablet (not administered)  ondansetron (ZOFRAN-ODT) disintegrating tablet 4 mg (4 mg Oral Given 07/10/16 1837)     Initial Impression / Assessment and Plan / ED Course  I have reviewed the triage vital signs and the nursing notes.  Pertinent labs & imaging results that were available during my care of the patient were reviewed by me and considered in my medical decision making (see chart for details).    41 year old male who presents to the emergency department with a one-day history of nausea without other associated symptoms. On arrival he distress, not ill appearing. Afebrile, hemodynamically stable. Benign abdominal exam. Received Zofran with resolution of his symptoms. Orthostatic blood pressure negative. Does not appear clinically dehydrated.  Lab work remarkable for no leukocytosis. No significant electrolyte abnormalities. Creatinine at baseline. Glucose 112. UA showed no signs of infectious process.  EKG showed normal sinus rhythm, normal intervals, no chamber enlargement, no signs of acute ischemia. Given that the patient is currently asymptomatic, benign abdominal exam and no chest pain. Did not feel that further workup for nausea is necessary at this time.  Patient stable for discharge home. Given her prescriptions for Zofran. Discussed follow-up with his primary care physician. Given strict return precautions to the ED.    Final Clinical Impressions(s) / ED Diagnoses   Final diagnoses:  Nausea    New Prescriptions Discharge Medication List as of 07/10/2016 10:42 PM    START taking these medications   Details  ondansetron (ZOFRAN) 4 MG tablet Take 1 tablet (4 mg total) by mouth every 8 (eight) hours as needed for nausea or vomiting., Starting Wed 07/10/2016, Print         Corena Herter, MD 07/10/16 2314    Nira Conn, MD 07/11/16 [phone]

## 2016-07-10 NOTE — ED Triage Notes (Signed)
Pt states since 1030 this am has been experiencing lightheadedness and nausea, though has not vomited.  No neuro deficits.  Denies any pain.

## 2016-07-10 NOTE — ED Notes (Signed)
Pt states he is ready to be d/c and is feeling better.  Informed pt MD will be in to reassess soon.

## 2016-08-03 ENCOUNTER — Emergency Department (HOSPITAL_COMMUNITY): Payer: Medicaid Other

## 2016-08-03 ENCOUNTER — Encounter (HOSPITAL_COMMUNITY): Payer: Self-pay | Admitting: Emergency Medicine

## 2016-08-03 ENCOUNTER — Emergency Department (HOSPITAL_COMMUNITY)
Admission: EM | Admit: 2016-08-03 | Discharge: 2016-08-04 | Disposition: A | Discharge status: Home / Self Care | Payer: Medicaid Other | Attending: Emergency Medicine | Admitting: Emergency Medicine

## 2016-08-03 DIAGNOSIS — Y92009 Unspecified place in unspecified non-institutional (private) residence as the place of occurrence of the external cause: Secondary | ICD-10-CM | POA: Diagnosis not present

## 2016-08-03 DIAGNOSIS — I129 Hypertensive chronic kidney disease with stage 1 through stage 4 chronic kidney disease, or unspecified chronic kidney disease: Secondary | ICD-10-CM | POA: Diagnosis not present

## 2016-08-03 DIAGNOSIS — M79674 Pain in right toe(s): Secondary | ICD-10-CM

## 2016-08-03 DIAGNOSIS — Z79899 Other long term (current) drug therapy: Secondary | ICD-10-CM | POA: Diagnosis not present

## 2016-08-03 DIAGNOSIS — Y939 Activity, unspecified: Secondary | ICD-10-CM | POA: Diagnosis not present

## 2016-08-03 DIAGNOSIS — Y999 Unspecified external cause status: Secondary | ICD-10-CM | POA: Diagnosis not present

## 2016-08-03 DIAGNOSIS — N183 Chronic kidney disease, stage 3 (moderate): Secondary | ICD-10-CM | POA: Diagnosis not present

## 2016-08-03 DIAGNOSIS — Z87891 Personal history of nicotine dependence: Secondary | ICD-10-CM | POA: Insufficient documentation

## 2016-08-03 DIAGNOSIS — W2203XA Walked into furniture, initial encounter: Secondary | ICD-10-CM | POA: Diagnosis not present

## 2016-08-03 DIAGNOSIS — S99921A Unspecified injury of right foot, initial encounter: Secondary | ICD-10-CM | POA: Insufficient documentation

## 2016-08-03 MED ORDER — ACETAMINOPHEN 500 MG PO TABS
1000.0000 mg | ORAL_TABLET | Freq: Once | ORAL | Status: AC
  Administered 2016-08-04: 1000 mg via ORAL
  Filled 2016-08-03: qty 2

## 2016-08-03 NOTE — ED Provider Notes (Signed)
[facility] Provider Note   CSN: [id number] Arrival date & time: 08/03/16  2217  By signing my name below, I, Kyle Montgomery, attest that this documentation has been prepared under the direction and in the presence of , Kyle SafeElizabeth Jerran Montgomery, [place]. Electronically Signed: Karren CobbleNy'Kea Montgomery, ED Scribe. 08/03/16. 11:55 PM.  History   Chief Complaint Chief Complaint  Patient presents with  . Toe Injury   The history is provided by the patient. No language interpreter was used.    HPI Comments: Kyle CaiJohn L Montgomery is a 41 y.o. male with no pertinent history, who presents to the Emergency Department complaining of right big toe pain s/p hitting his foot on the bed rail shortly PTA. He notes associated swelling and pain with ROM of the digits of the right foot. No treatment tried PTA. Pt is ambulatory without difficulty.   Past Medical History:  Diagnosis Date  . Bipolar affective (HCC)    followed by mental health  . Gynecomastia 02/07   normal pituitary by MRI  . 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 Hypertension   . Renal insufficiency    baseline Cr 2.2 - renal US with increased Echo signal, no nephrology eval, no bx    Patient Active Problem List   Diagnosis Date Noted  . Hip pain, left 04/09/2016  . Abnormal finding of diagnostic imaging 04/09/2016  . Sprain of ankle 02/26/2016  . Hyperlipidemia 09/06/2014  . Healthcare maintenance 12/01/2012  . CKD (chronic kidney disease), stage III 09/01/2012  . Bipolar disorder (HCC) 11/06/2005  . Essential hypertension 11/06/2005   History reviewed. No pertinent surgical history.  Home Medications    Prior to Admission medications   Medication Sig Start Date End Date Taking? Authorizing Provider  acetaminophen (TYLENOL) 325 MG tablet Take 2 tablets (650 mg total) by mouth every 6 (six) hours as needed. 08/04/16   Cristina GongHammond, Bluma Buresh W, PA-C  amLODipine (NORVASC) 10 MG tablet Take 1 tablet (10 mg total) by  mouth daily. 05/22/16   Rivet, Iris Pertarly J, MD  atorvastatin (LIPITOR) 20 MG tablet Take 1 tablet (20 mg total) by mouth daily. 05/22/16   Rivet, Iris Pertarly J, MD  divalproex (DEPAKOTE ER) 500 MG 24 hr tablet Take 1 tablet (500 mg total) by mouth at bedtime. 05/22/16   Rivet, Iris Pertarly J, MD  lisinopril-hydrochlorothiazide (PRINZIDE,ZESTORETIC) 20-12.5 MG tablet take 2 tablets by mouth daily 05/22/16 05/22/17  Rivet, Iris Pertarly J, MD  ondansetron (ZOFRAN) 4 MG tablet Take 1 tablet (4 mg total) by mouth every 8 (eight) hours as needed for nausea or vomiting. 07/10/16   Corena HerterMumma, Shannon, MD  ziprasidone (GEODON) 80 MG capsule Take 1 capsule (80 mg total) by mouth 2 (two) times daily with a meal. 05/22/16   Rivet, Iris Pertarly J, MD   Family History Family History  Problem Relation Age of Onset  . Hyperlipidemia Mother   . Hypertension Mother   . Atrial fibrillation Mother   . Hypertension Brother   . Hypertension Maternal Grandmother   . Cancer Maternal Grandmother    Social History Social History  Substance Use Topics  . Smoking status: Former Smoker    Types: Cigarettes  . Smokeless tobacco: Never Used  . Alcohol use No   Allergies   Patient has no known allergies.   Review of Systems Review of Systems  Musculoskeletal: Positive for arthralgias. Negative for gait problem.       Right big toe pain and swelling.   Skin: Negative for color  change, pallor and wound.    Physical Exam Updated Vital Signs BP (!) 127/92 (BP Location: Left Arm)   Pulse 88   Temp 98.4 F (36.9 C) (Oral)   Resp 18   Ht 5\' 10"  (1.778 m)   Wt 95.3 kg (210 lb)   SpO2 100%   BMI 30.13 kg/m     Physical Exam  Constitutional: He appears well-developed and well-nourished. No distress.  Musculoskeletal:  Right foot examined, with no obvious deformities. Foot is warm and well perfused.  No subungual hematomas. Patient is able to move all toes, says that they are slightly painful. Mild TTP, mostly over right first toe.  No obvious skin  breaks.  Neurological: He is alert. No sensory deficit.  Skin: Skin is warm and dry.  Psychiatric: His behavior is normal.  Nursing note and vitals reviewed.  ED Treatments / Results  DIAGNOSTIC STUDIES: Oxygen Saturation is 100% on RA, normal by my interpretation.   COORDINATION OF CARE: 11:51 PM-Discussed next steps with pt. Pt verbalized understanding and is agreeable with the plan.   Labs (all labs ordered are listed, but only abnormal results are displayed) Labs Reviewed - No data to display  EKG  EKG Interpretation None       Radiology Dg Foot Complete Right  Result Date: 08/03/2016 CLINICAL DATA:  Trip and fall, struck foot on bed frame.  Toe pain. EXAM: RIGHT FOOT COMPLETE - 3+ VIEW COMPARISON:  RIGHT foot radiograph September 11, 2008 FINDINGS: There is no evidence of fracture or dislocation. Tiny plantar calcaneal spur. There is no evidence of arthropathy or other focal bone abnormality. Soft tissues are unremarkable. IMPRESSION: Negative. Electronically Signed   By: Awilda Metro M.D.   On: 08/03/2016 23:30    Procedures Procedures (including critical care time)  Medications Ordered in ED Medications  acetaminophen (TYLENOL) tablet 1,000 mg (1,000 mg Oral Given 08/04/16 0030)     Initial Impression / Assessment and Plan / ED Course  I have reviewed the triage vital signs and the nursing notes.  Pertinent labs & imaging results that were available during my care of the patient were reviewed by me and considered in my medical decision making (see chart for details).     Kyle Montgomery presents After hitting hit the toes on his right foot on a Benadryl shortly prior to arrival. He did not try any interventions prior to arrival. X-rays were obtained and were negative for acute osseous abnormalities.  No obvious skin breaks, or subungual hematomas. Patient was treated with Tylenol here in the ED, given postop shoe, and instructions to follow-up with his PCP if  his symptoms worsen or fail to improve in one week.  Final Clinical Impressions(s) / ED Diagnoses   Final diagnoses:  Injury of toe on right foot, initial encounter  Pain of toe of right foot    New Prescriptions Discharge Medication List as of 08/04/2016 12:02 AM    I personally performed the services described in this documentation, which was scribed in my presence. The recorded information has been reviewed and is accurate.    Cristina Gong, PA-C 08/04/16 0127    Nira Conn, MD 08/05/16 0003

## 2016-08-03 NOTE — ED Triage Notes (Signed)
Pt to ED c/o R foot (toes) pain - states he tripped getting out of bed and caught his foot under the bed rail, hitting the three middle toes. Reports difficulty moving them d/t pain, pain with ambulation. No noticeable swelling and no obvious injuries noted, CSM intact.

## 2016-08-04 MED ORDER — ACETAMINOPHEN 325 MG PO TABS: 650.0000 mg | ORAL_TABLET | Freq: Four times a day (QID) | ORAL | 0 refills | Status: DC | PRN

## 2016-08-04 NOTE — Discharge Instructions (Signed)
Please take Tylenol for your pain. You may take up to 1000 mg (2 extra strength tablets) every 8 hours. Please check all medications as many medications may contain Tylenol (acetaminophen).

## 2016-08-16 ENCOUNTER — Encounter: Payer: Medicaid Other | Admitting: Internal Medicine

## 2016-09-03 ENCOUNTER — Other Ambulatory Visit: Payer: Self-pay | Admitting: *Deleted

## 2016-09-03 MED ORDER — LISINOPRIL-HYDROCHLOROTHIAZIDE 20-12.5 MG PO TABS: 2.0000 | ORAL_TABLET | Freq: Every day | ORAL | 3 refills | Status: DC

## 2016-09-03 NOTE — Telephone Encounter (Signed)
Pt has an appt 10/01/16.   

## 2016-09-13 MED FILL — LISINOPRIL-HCTZ 20-12.5 MG: 20-12.5 | 90 days supply | Qty: 180 | Fill #0

## 2016-09-13 NOTE — Telephone Encounter (Signed)
Called in [facility] to [facility] per pt request.

## 2016-09-27 ENCOUNTER — Emergency Department (HOSPITAL_COMMUNITY)
Admission: EM | Admit: 2016-09-27 | Discharge: 2016-09-27 | Disposition: A | Discharge status: Home / Self Care | Payer: Medicaid Other | Attending: Emergency Medicine | Admitting: Emergency Medicine

## 2016-09-27 ENCOUNTER — Encounter (HOSPITAL_COMMUNITY): Payer: Self-pay | Admitting: *Deleted

## 2016-09-27 DIAGNOSIS — Z87891 Personal history of nicotine dependence: Secondary | ICD-10-CM | POA: Diagnosis not present

## 2016-09-27 DIAGNOSIS — N183 Chronic kidney disease, stage 3 (moderate): Secondary | ICD-10-CM | POA: Insufficient documentation

## 2016-09-27 DIAGNOSIS — Z79899 Other long term (current) drug therapy: Secondary | ICD-10-CM | POA: Diagnosis not present

## 2016-09-27 DIAGNOSIS — I129 Hypertensive chronic kidney disease with stage 1 through stage 4 chronic kidney disease, or unspecified chronic kidney disease: Secondary | ICD-10-CM | POA: Insufficient documentation

## 2016-09-27 DIAGNOSIS — Z760 Encounter for issue of repeat prescription: Secondary | ICD-10-CM | POA: Diagnosis present

## 2016-09-27 DIAGNOSIS — F3170 Bipolar disorder, currently in remission, most recent episode unspecified: Secondary | ICD-10-CM

## 2016-09-27 DIAGNOSIS — F319 Bipolar disorder, unspecified: Secondary | ICD-10-CM | POA: Diagnosis not present

## 2016-09-27 MED ORDER — DIVALPROEX SODIUM ER 500 MG PO TB24: 500.0000 mg | ORAL_TABLET | Freq: Every day | ORAL | 0 refills | Status: DC

## 2016-09-27 MED ORDER — ZIPRASIDONE HCL 80 MG PO CAPS: 80.0000 mg | ORAL_CAPSULE | Freq: Two times a day (BID) | ORAL | 0 refills | Status: DC

## 2016-09-27 NOTE — Discharge Instructions (Signed)
Continue home medications as previously prescribed. Follow-up with clinic for further evaluation.

## 2016-09-27 NOTE — ED Provider Notes (Signed)
[facility] Provider Note   CSN: [id number] Arrival date & time: 09/27/16  1610     History   Chief Complaint No chief complaint on file.   HPI Kyle Montgomery is a 41 y.o. male.  HPI Patient, with a past medical history of bipolar disorder, presents to ED for medication refill. He states that he got to the behavioral health clinic to light and they had already closed. He was told to come here for refills of his Geodon and Depakote. States that he has been taking these medications for years and they have worked well for him. Denies other symptoms at this time.  Past Medical History:  Diagnosis Date  . Bipolar affective (HCC)    followed by mental health  . Gynecomastia 02/07   normal pituitary by MRI  . 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 Hypertension   . Renal insufficiency    baseline Cr 2.2 - renal US with increased Echo signal, no nephrology eval, no bx    Patient Active Problem List   Diagnosis Date Noted  . Hip pain, left 04/09/2016  . Abnormal finding of diagnostic imaging 04/09/2016  . Sprain of ankle 02/26/2016  . Hyperlipidemia 09/06/2014  . Healthcare maintenance 12/01/2012  . CKD (chronic kidney disease), stage III 09/01/2012  . Bipolar disorder (HCC) 11/06/2005  . Essential hypertension 11/06/2005    History reviewed. No pertinent surgical history.     Home Medications    Prior to Admission medications   Medication Sig Start Date End Date Taking? Authorizing Provider  acetaminophen (TYLENOL) 325 MG tablet Take 2 tablets (650 mg total) by mouth every 6 (six) hours as needed. 08/04/16   Cristina Gong, PA-C  amLODipine (NORVASC) 10 MG tablet Take 1 tablet (10 mg total) by mouth daily. 05/22/16   Rivet, Iris Pert, MD  atorvastatin (LIPITOR) 20 MG tablet Take 1 tablet (20 mg total) by mouth daily. 05/22/16   Rivet, Iris Pert, MD  divalproex (DEPAKOTE ER) 500 MG 24 hr tablet Take 1 tablet (500 mg total) by mouth at  bedtime. 09/27/16   Baylin Cabal, PA-C  lisinopril-hydrochlorothiazide (PRINZIDE,ZESTORETIC) 20-12.5 MG tablet Take 2 tablets by mouth daily. 09/03/16 09/03/17  Ginger Carne, MD  ondansetron (ZOFRAN) 4 MG tablet Take 1 tablet (4 mg total) by mouth every 8 (eight) hours as needed for nausea or vomiting. 07/10/16   Corena Herter, MD  ziprasidone (GEODON) 80 MG capsule Take 1 capsule (80 mg total) by mouth 2 (two) times daily with a meal. 09/27/16   Aston Lieske, Hillary Bow, PA-C    Family History Family History  Problem Relation Age of Onset  . Hyperlipidemia Mother   . Hypertension Mother   . Atrial fibrillation Mother   . Hypertension Brother   . Hypertension Maternal Grandmother   . Cancer Maternal Grandmother     Social History Social History  Substance Use Topics  . Smoking status: Former Smoker    Types: Cigarettes  . Smokeless tobacco: Never Used  . Alcohol use No     Allergies   Patient has no known allergies.   Review of Systems Review of Systems  Constitutional: Negative for chills and fever.  Gastrointestinal: Negative for nausea and vomiting.  Neurological: Negative for syncope.     Physical Exam Updated Vital Signs BP (!) 131/93 (BP Location: Left Arm)   Pulse 80   Temp 99.1 F (37.3 C) (Oral)   Resp 17   Ht  (1.778 m)  Wt 95.3 kg (210 lb)   SpO2 99%   BMI 30.13 kg/m   Physical Exam  Constitutional: He appears well-developed and well-nourished. No distress.  HENT:  Head: Normocephalic and atraumatic.  Eyes: Conjunctivae and EOM are normal. No scleral icterus.  Neck: Normal range of motion.  Pulmonary/Chest: Effort normal. No respiratory distress.  Neurological: He is alert.  Skin: No rash noted. He is not diaphoretic.  Psychiatric: He has a normal mood and affect.  Nursing note and vitals reviewed.    ED Treatments / Results  Labs (all labs ordered are listed, but only abnormal results are displayed) Labs Reviewed - No data to display  EKG  EKG  Interpretation None       Radiology No results found.  Procedures Procedures (including critical care time)  Medications Ordered in ED Medications - No data to display   Initial Impression / Assessment and Plan / ED Course  I have reviewed the triage vital signs and the nursing notes.  Pertinent labs & imaging results that were available during my care of the patient were reviewed by me and considered in my medical decision making (see chart for details).     Patient presents to ED for medication refill. Went to behavioral health outpatient clinic to light and was told to come to ED for refills of Geodon and Depakote. States that dosages have not changed from our records. He denies any symptoms at this time. Patient given refills and advised to follow-up as appropriate. Strict return precautions given.  Final Clinical Impressions(s) / ED Diagnoses   Final diagnoses:  Encounter for medication refill    New Prescriptions Current Discharge Medication List       Dietrich PatesKhatri, Zera Markwardt, Cordelia Poche-C 09/27/16 1956    Linwood DibblesKnapp, Jon, MD 09/27/16 [phone]

## 2016-09-27 NOTE — ED Triage Notes (Signed)
Pt in requesting refill on Geodon and Depakote, pt reports outpatient clinic cannot refill today because they were closed, pt denies any other problems at this time, A&O x4

## 2016-09-30 MED ORDER — DEXTROSE 5 % IV SOLN
INTRAVENOUS | Status: AC
  Filled 2016-09-30: qty 1.5

## 2016-10-01 ENCOUNTER — Encounter: Payer: Medicaid Other | Admitting: Internal Medicine

## 2016-10-24 ENCOUNTER — Telehealth: Payer: Self-pay

## 2016-10-24 ENCOUNTER — Other Ambulatory Visit: Payer: Self-pay | Admitting: *Deleted

## 2016-10-24 DIAGNOSIS — F3170 Bipolar disorder, currently in remission, most recent episode unspecified: Secondary | ICD-10-CM

## 2016-10-24 DIAGNOSIS — E7849 Other hyperlipidemia: Secondary | ICD-10-CM

## 2016-10-24 MED ORDER — ZIPRASIDONE HCL 80 MG PO CAPS: 80.0000 mg | ORAL_CAPSULE | Freq: Two times a day (BID) | ORAL | 1 refills | Status: DC

## 2016-10-24 MED ORDER — DIVALPROEX SODIUM ER 500 MG PO TB24: 500.0000 mg | ORAL_TABLET | Freq: Every day | ORAL | 1 refills | Status: DC

## 2016-10-24 MED ORDER — AMLODIPINE BESYLATE 10 MG PO TABS
10.0000 mg | ORAL_TABLET | Freq: Every day | ORAL | 1 refills | Status: DC
Start: 2016-10-24 — End: 2016-11-12

## 2016-10-24 MED ORDER — ATORVASTATIN CALCIUM 20 MG PO TABS: 20.0000 mg | ORAL_TABLET | Freq: Every day | ORAL | 3 refills | Status: DC

## 2016-10-24 NOTE — Telephone Encounter (Signed)
Requesting all meds to be filled @ [facility] on [address].

## 2016-10-24 NOTE — Telephone Encounter (Signed)
Spoke to patient's mother, informed meds are filled & reminded of appt on 11/12/16.

## 2016-10-29 ENCOUNTER — Encounter: Payer: Medicaid Other | Admitting: Internal Medicine

## 2016-11-12 ENCOUNTER — Encounter: Payer: Self-pay | Admitting: Internal Medicine

## 2016-11-12 ENCOUNTER — Ambulatory Visit (INDEPENDENT_AMBULATORY_CARE_PROVIDER_SITE_OTHER): Payer: Medicaid Other | Admitting: Internal Medicine

## 2016-11-12 ENCOUNTER — Encounter (INDEPENDENT_AMBULATORY_CARE_PROVIDER_SITE_OTHER): Payer: Self-pay

## 2016-11-12 VITALS — BP 134/86 | HR 90 | Temp 98.2°F | Ht 70.0 in | Wt 202.1 lb

## 2016-11-12 DIAGNOSIS — Z87891 Personal history of nicotine dependence: Secondary | ICD-10-CM | POA: Diagnosis not present

## 2016-11-12 DIAGNOSIS — Z79899 Other long term (current) drug therapy: Secondary | ICD-10-CM | POA: Diagnosis not present

## 2016-11-12 DIAGNOSIS — I10 Essential (primary) hypertension: Secondary | ICD-10-CM | POA: Diagnosis not present

## 2016-11-12 DIAGNOSIS — F3170 Bipolar disorder, currently in remission, most recent episode unspecified: Secondary | ICD-10-CM

## 2016-11-12 MED ORDER — ATORVASTATIN CALCIUM 20 MG PO TABS: 20.0000 mg | ORAL_TABLET | Freq: Every day | ORAL | 3 refills | Status: DC

## 2016-11-12 MED ORDER — DIVALPROEX SODIUM ER 500 MG PO TB24: 500.0000 mg | ORAL_TABLET | Freq: Every day | ORAL | 3 refills | Status: DC

## 2016-11-12 MED ORDER — AMLODIPINE BESYLATE 10 MG PO TABS: 10.0000 mg | ORAL_TABLET | Freq: Every day | ORAL | 3 refills | Status: DC

## 2016-11-12 MED ORDER — ACETAMINOPHEN 325 MG PO TABS: 650.0000 mg | ORAL_TABLET | Freq: Four times a day (QID) | ORAL | 0 refills | Status: DC | PRN

## 2016-11-12 MED ORDER — LISINOPRIL-HYDROCHLOROTHIAZIDE 20-12.5 MG PO TABS: 2.0000 | ORAL_TABLET | Freq: Every day | ORAL | 3 refills | Status: DC

## 2016-11-12 MED ORDER — ZIPRASIDONE HCL 80 MG PO CAPS: 80.0000 mg | ORAL_CAPSULE | Freq: Two times a day (BID) | ORAL | 3 refills | Status: DC

## 2016-11-12 NOTE — Progress Notes (Signed)
   CC: Hypertension follow up   HPI:  Kyle Montgomery is a 41 y.o. with past medical history as described below who presents to the clinic for routine follow up.   Kyle Montgomery denies specific complaints today and is most concerned with the status of his medication refills. He states his pharmacy informed him he did not have refills remaining for his current medications once his supply runs out. He is concerned as he takes his medication regularly as prescribed and does not want to miss doses. On chart review, he has had short term supply of his chronic medications provided from the ED and over the phone which may have contributed to status of refills.   He states his mood is stable with no depressive or manic symptoms reported.   Past Medical History:  Diagnosis Date  . Bipolar affective (HCC)    followed by mental health  . Gynecomastia 02/07   normal pituitary by MRI  . 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 Hypertension   . Renal insufficiency    baseline Cr 2.2 - renal US with increased Echo signal, no nephrology eval, no bx   Review of Systems:  Review of Systems  Constitutional: Negative for fever.  Eyes: Negative for blurred vision.  Respiratory: Negative for shortness of breath.   Cardiovascular: Negative for chest pain.  Gastrointestinal: Negative for abdominal pain.  Psychiatric/Behavioral: Negative for depression.     Physical Exam:  Vitals:   11/12/16 1438  BP: 134/86  Pulse: 90  Temp: 98.2 F (36.8 C)  TempSrc: Oral  SpO2: 100%  Weight: 202 lb 1.6 oz (91.7 kg)  Height: 5\' 10"  (1.778 m)   Physical Exam  Constitutional: He is oriented to person, place, and time. He appears well-developed and well-nourished.  Cardiovascular: Normal rate and regular rhythm.   Pulmonary/Chest: Effort normal and breath sounds normal.  Neurological: He is alert and oriented to person, place, and time.    Assessment & Plan:   See Encounters  Tab for problem based charting.  Patient seen with Dr. Criselda PeachesMullen

## 2016-11-12 NOTE — Assessment & Plan Note (Addendum)
Pt mood is stable on current regimen with no reported depressive or manic symptoms. Refills provided for psychiatric medications with refills available for long term supply.  --Continue Depakote 500 mg daily and Ziprasidone 80 mg BID

## 2016-11-12 NOTE — Assessment & Plan Note (Signed)
BP is at goal today on current regimen which pt reports tolerating well. Refills were provided and prescriptions adjusted to provide refills to allow for long term supply available for chronic medications.   --Continue Lisinopril- HCTZ 40-25 daily (20-12.5 tab x2)  --Continue Amlodipine 10 mg

## 2016-11-12 NOTE — Patient Instructions (Signed)
Nice to meet you today [name].  I recent your prescriptions to your pharmacy so they can be picked up and in the future you should have refills in the computer that the pharmacy can see.  I am glad that you are doing well, your blood pressure looks great.  I will see you back in 6 months, but if anything changes you can call the clinic for an appointment sooner.

## 2016-11-20 NOTE — Progress Notes (Signed)
Internal Medicine Clinic Attending  I saw and evaluated the patient.  I personally confirmed the key portions of the history and exam documented by Dr. Harden and I reviewed pertinent patient test results.  The assessment, diagnosis, and plan were formulated together and I agree with the documentation in the resident's note.  

## 2016-12-13 ENCOUNTER — Encounter (HOSPITAL_COMMUNITY): Payer: Self-pay

## 2016-12-13 ENCOUNTER — Emergency Department (HOSPITAL_COMMUNITY): Payer: Medicaid Other

## 2016-12-13 ENCOUNTER — Emergency Department (HOSPITAL_COMMUNITY)
Admission: EM | Admit: 2016-12-13 | Discharge: 2016-12-13 | Disposition: A | Discharge status: Home / Self Care | Payer: Medicaid Other | Attending: Emergency Medicine | Admitting: Emergency Medicine

## 2016-12-13 ENCOUNTER — Other Ambulatory Visit: Payer: Self-pay

## 2016-12-13 DIAGNOSIS — Y92003 Bedroom of unspecified non-institutional (private) residence as the place of occurrence of the external cause: Secondary | ICD-10-CM | POA: Insufficient documentation

## 2016-12-13 DIAGNOSIS — Y999 Unspecified external cause status: Secondary | ICD-10-CM | POA: Diagnosis not present

## 2016-12-13 DIAGNOSIS — X501XXA Overexertion from prolonged static or awkward postures, initial encounter: Secondary | ICD-10-CM | POA: Diagnosis not present

## 2016-12-13 DIAGNOSIS — Z79899 Other long term (current) drug therapy: Secondary | ICD-10-CM | POA: Insufficient documentation

## 2016-12-13 DIAGNOSIS — N183 Chronic kidney disease, stage 3 (moderate): Secondary | ICD-10-CM | POA: Diagnosis not present

## 2016-12-13 DIAGNOSIS — S8002XA Contusion of left knee, initial encounter: Secondary | ICD-10-CM | POA: Insufficient documentation

## 2016-12-13 DIAGNOSIS — Z87891 Personal history of nicotine dependence: Secondary | ICD-10-CM | POA: Insufficient documentation

## 2016-12-13 DIAGNOSIS — I129 Hypertensive chronic kidney disease with stage 1 through stage 4 chronic kidney disease, or unspecified chronic kidney disease: Secondary | ICD-10-CM | POA: Diagnosis not present

## 2016-12-13 DIAGNOSIS — Y9389 Activity, other specified: Secondary | ICD-10-CM | POA: Insufficient documentation

## 2016-12-13 DIAGNOSIS — S8992XA Unspecified injury of left lower leg, initial encounter: Secondary | ICD-10-CM | POA: Diagnosis present

## 2016-12-13 MED ORDER — KETOROLAC TROMETHAMINE 60 MG/2ML IM SOLN
30.0000 mg | Freq: Once | INTRAMUSCULAR | Status: AC
  Administered 2016-12-13: 30 mg via INTRAMUSCULAR
  Filled 2016-12-13: qty 2

## 2016-12-13 MED ORDER — ACETAMINOPHEN 500 MG PO TABS: 500.0000 mg | ORAL_TABLET | Freq: Four times a day (QID) | ORAL | 0 refills | Status: DC | PRN

## 2016-12-13 NOTE — ED Provider Notes (Signed)
[name] Provider Note   CSN: [id number] Arrival date & time: 12/13/16  1111     History   Chief Complaint No chief complaint on file.   HPI Karenann CaiJohn L Andre is a 41 y.o. male.  HPI Karenann CaiJohn L Raimondi is a 41 y.o. male with history of bipolar disorder, hypertension, renal insufficiency, presents to emergency department complaining of a left knee injury.  Patient states that he was helping his mother get some stuff from underneath her bed, when he kneeled down and hit his knee on the floor.  This injury occurred yesterday.  He states he is able to ambulate on his leg but it is painful.  His pain is mainly to the anterior of the knee.  He denies distal to the injury.  There has not been any other injuries.  He has not tried any medications to help with his symptoms prior to coming in.  Past Medical History:  Diagnosis Date  . Bipolar affective (HCC)    followed by mental health  . Gynecomastia 02/07   normal pituitary by MRI  . 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 Hypertension   . Renal insufficiency    baseline Cr 2.2 - renal US with increased Echo signal, no nephrology eval, no bx    Patient Active Problem List   Diagnosis Date Noted  . Hip pain, left 04/09/2016  . Abnormal finding of diagnostic imaging 04/09/2016  . Sprain of ankle 02/26/2016  . Hyperlipidemia 09/06/2014  . Healthcare maintenance 12/01/2012  . CKD (chronic kidney disease), stage III (HCC) 09/01/2012  . Bipolar disorder (HCC) 11/06/2005  . Essential hypertension 11/06/2005    History reviewed. No pertinent surgical history.     Home Medications    Prior to Admission medications   Medication Sig Start Date End Date Taking? Authorizing Provider  acetaminophen (TYLENOL) 325 MG tablet Take 2 tablets (650 mg total) by mouth every 6 (six) hours as needed. 11/12/16   Ginger CarneHarden, Kyler, MD  amLODipine (NORVASC) 10 MG tablet Take 1 tablet  (10 mg total) by mouth daily. 11/12/16   Ginger CarneHarden, Kyler, MD  atorvastatin (LIPITOR) 20 MG tablet Take 1 tablet (20 mg total) by mouth daily. 11/12/16   Ginger CarneHarden, Kyler, MD  divalproex (DEPAKOTE ER) 500 MG 24 hr tablet Take 1 tablet (500 mg total) by mouth at bedtime. 11/12/16   Ginger CarneHarden, Kyler, MD  lisinopril-hydrochlorothiazide (PRINZIDE,ZESTORETIC) 20-12.5 MG tablet Take 2 tablets by mouth daily. 11/12/16 11/12/17  Ginger CarneHarden, Kyler, MD  ziprasidone (GEODON) 80 MG capsule Take 1 capsule (80 mg total) by mouth 2 (two) times daily with a meal. 11/12/16   Ginger CarneHarden, Kyler, MD    Family History Family History  Problem Relation Age of Onset  . Hyperlipidemia Mother   . Hypertension Mother   . Atrial fibrillation Mother   . Hypertension Brother   . Hypertension Maternal Grandmother   . Cancer Maternal Grandmother     Social History Social History   Tobacco Use  . Smoking status: Former Smoker    Types: Cigarettes  . Smokeless tobacco: Never Used  Substance Use Topics  . Alcohol use: No    Alcohol/week: 0.0 oz  . Drug use: No     Allergies   Patient has no known allergies.   Review of Systems Review of Systems  Constitutional: Negative for chills and fever.  Musculoskeletal: Positive for arthralgias and joint swelling.  Neurological: Negative for weakness, numbness and headaches.  All other systems reviewed and are negative.    Physical Exam Updated Vital Signs BP (!) 129/92 (BP Location: Left Arm)   Pulse 95   Temp 98.7 F (37.1 C) (Oral)   Resp 18   Ht 5\' 10"  (1.778 m)   Wt 95.3 kg (210 lb)   SpO2 100%   BMI 30.13 kg/m   Physical Exam  Constitutional: He appears well-developed and well-nourished. No distress.  Eyes: Conjunctivae are normal.  Neck: Neck supple.  Cardiovascular: Normal rate.  Pulmonary/Chest: No respiratory distress.  Abdominal: He exhibits no distension.  Musculoskeletal:  Mild swelling noted to the anterior left knee with no bruising, erythema, skin  abrasions or lesions.  Pain with complete extension of the knee, however patient is able to extend his knee against resistance.  Full range of motion with flexion.  Negative anterior and posterior drawer sign.  Tenderness to the anterior knee and patella.  No tenderness over medial, lateral, posterior joint.  Dorsalis pedal pulses intact  Skin: Skin is warm and dry.  Nursing note and vitals reviewed.    ED Treatments / Results  Labs (all labs ordered are listed, but only abnormal results are displayed) Labs Reviewed - No data to display  EKG  EKG Interpretation None       Radiology Dg Knee Complete 4 Views Left  Result Date: 12/13/2016 CLINICAL DATA:  Recent fall with knee pain, initial encounter EXAM: LEFT KNEE - COMPLETE 4+ VIEW COMPARISON:  None. FINDINGS: No acute fracture or dislocation is noted. No joint effusion is seen. Mild soft tissue swelling is noted along the inferior aspect of the patella. IMPRESSION: Mild soft tissue swelling without acute bony abnormality. Electronically Signed   By: Alcide CleverMark  Lukens M.D.   On: 12/13/2016 12:11    Procedures Procedures (including critical care time)  Medications Ordered in ED Medications - No data to display   Initial Impression / Assessment and Plan / ED Course  I have reviewed the triage vital signs and the nursing notes.  Pertinent labs & imaging results that were available during my care of the patient were reviewed by me and considered in my medical decision making (see chart for details).     Patient in the emergency department with knee injury that occurred yesterday.  Mild swelling noted in the exam, x-ray showing soft tissue swelling without acute bony abnormality.  Full range of motion of the joint.  Patella tendon is intact.  Joint is stable with negative anterior posterior drawer signs.  Suspect soft tissue contusion from hitting his knee.  Possibly bursitis.  Will treat with knee sleeve, Tylenol, patient does have renal  insufficiency. Follow up with pcp next week.   Vitals:   12/13/16 1121  BP: (!) 129/92  Pulse: 95  Resp: 18  Temp: 98.7 F (37.1 C)  TempSrc: Oral  SpO2: 100%  Weight: 95.3 kg (210 lb)  Height: 5\' 10"  (1.778 m)     Final Clinical Impressions(s) / ED Diagnoses   Final diagnoses:  Contusion of left knee, initial encounter    ED Discharge Orders        Ordered    acetaminophen (TYLENOL) 500 MG tablet  Every 6 hours PRN     12/13/16 1350       Jaynie CrumbleKirichenko, Allyne Hebert, PA-C 12/13/16 1352    Mancel BaleWentz, Elliott, MD 12/13/16 1654

## 2016-12-13 NOTE — Discharge Instructions (Signed)
Continue to take Tylenol for your pain. New prescription is provided.  Make sure to not take more than prescribed. Follow up with family doctor if not improving next week.

## 2016-12-14 ENCOUNTER — Emergency Department (HOSPITAL_COMMUNITY)
Admission: EM | Admit: 2016-12-14 | Discharge: 2016-12-14 | Disposition: A | Discharge status: Home / Self Care | Payer: Medicaid Other | Attending: Emergency Medicine | Admitting: Emergency Medicine

## 2016-12-14 ENCOUNTER — Other Ambulatory Visit: Payer: Self-pay

## 2016-12-14 ENCOUNTER — Encounter (HOSPITAL_COMMUNITY): Payer: Self-pay | Admitting: Emergency Medicine

## 2016-12-14 DIAGNOSIS — Z87891 Personal history of nicotine dependence: Secondary | ICD-10-CM | POA: Diagnosis not present

## 2016-12-14 DIAGNOSIS — I129 Hypertensive chronic kidney disease with stage 1 through stage 4 chronic kidney disease, or unspecified chronic kidney disease: Secondary | ICD-10-CM | POA: Insufficient documentation

## 2016-12-14 DIAGNOSIS — Z79899 Other long term (current) drug therapy: Secondary | ICD-10-CM | POA: Diagnosis not present

## 2016-12-14 DIAGNOSIS — M25562 Pain in left knee: Secondary | ICD-10-CM | POA: Insufficient documentation

## 2016-12-14 DIAGNOSIS — N183 Chronic kidney disease, stage 3 (moderate): Secondary | ICD-10-CM | POA: Insufficient documentation

## 2016-12-14 MED ORDER — ACETAMINOPHEN 500 MG PO TABS
500.0000 mg | ORAL_TABLET | Freq: Once | ORAL | Status: AC
  Administered 2016-12-14: 500 mg via ORAL
  Filled 2016-12-14: qty 1

## 2016-12-14 MED ORDER — DICLOFENAC SODIUM 1 % TD GEL: 4.0000 g | Freq: Four times a day (QID) | TRANSDERMAL | 0 refills | Status: DC

## 2016-12-14 NOTE — ED Triage Notes (Addendum)
Pt c/o L knee pain, pt states he was seen for same last night.  Pt states he is still having pain, pt states he was told if pain was not relieved by Tylenol come back to ED for "better pain killers"

## 2016-12-14 NOTE — ED Provider Notes (Signed)
[name] Provider [facility]   CSN: [id number] Arrival date & time: 12/14/16  1942     History   Chief Complaint Chief Complaint  Patient presents with  . Knee Pain    HPI Karenann CaiJohn L Kiesler is a 41 y.o. male with a history of bipolar disorder, hypertension, renal insufficiency who presents to the emergency department today for continuing left knee pain.  Patient was seen here yesterday for left knee injury after hitting his knee while kneeling down on the floor to help his mother get something underneath her bed.  Patient's x-rays were negative for fracture dislocation.  He was given a knee sleeve, Tylenol and advised to follow with PCP next week.  No NSAIDs were advised as patient has renal insufficiency.  He is presenting today for continued pain.  He says it is painful when he initially gets up, ambulates and with flexion/extension of the knee.  He has been taking 500 mg of Tylenol every 8 hours with only mild relief.  He has been wearing the knee sleeve that is provided.  He still able to walk but notes that it is painful.  No falls or injury since discharge.  He denies any numbness or tingling of the lower extremity.  No fevers or overlying erythema.  HPI  Past Medical History:  Diagnosis Date  . Bipolar affective (HCC)    followed by mental health  . Gynecomastia 02/07   normal pituitary by MRI  . 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 Hypertension   . Renal insufficiency    baseline Cr 2.2 - renal US with increased Echo signal, no nephrology eval, no bx    Patient Active Problem List   Diagnosis Date Noted  . Hip pain, left 04/09/2016  . Abnormal finding of diagnostic imaging 04/09/2016  . Sprain of ankle 02/26/2016  . Hyperlipidemia 09/06/2014  . Healthcare maintenance 12/01/2012  . CKD (chronic kidney disease), stage III (HCC) 09/01/2012  . Bipolar disorder (HCC) 11/06/2005  . Essential hypertension  11/06/2005    History reviewed. No pertinent surgical history.     Home Medications    Prior to Admission medications   Medication Sig Start Date End Date Taking? Authorizing Provider  acetaminophen (TYLENOL) 325 MG tablet Take 2 tablets (650 mg total) by mouth every 6 (six) hours as needed. 11/12/16   Ginger CarneHarden, Kyler, MD  acetaminophen (TYLENOL) 500 MG tablet Take 1 tablet (500 mg total) by mouth every 6 (six) hours as needed. 12/13/16   Kirichenko, Tatyana, PA-C  amLODipine (NORVASC) 10 MG tablet Take 1 tablet (10 mg total) by mouth daily. 11/12/16   Ginger CarneHarden, Kyler, MD  atorvastatin (LIPITOR) 20 MG tablet Take 1 tablet (20 mg total) by mouth daily. 11/12/16   Ginger CarneHarden, Kyler, MD  divalproex (DEPAKOTE ER) 500 MG 24 hr tablet Take 1 tablet (500 mg total) by mouth at bedtime. 11/12/16   Ginger CarneHarden, Kyler, MD  lisinopril-hydrochlorothiazide (PRINZIDE,ZESTORETIC) 20-12.5 MG tablet Take 2 tablets by mouth daily. 11/12/16 11/12/17  Ginger CarneHarden, Kyler, MD  ziprasidone (GEODON) 80 MG capsule Take 1 capsule (80 mg total) by mouth 2 (two) times daily with a meal. 11/12/16   Ginger CarneHarden, Kyler, MD    Family History Family History  Problem Relation Age of Onset  . Hyperlipidemia Mother   . Hypertension Mother   . Atrial fibrillation Mother   . Hypertension Brother   . Hypertension Maternal Grandmother   . Cancer Maternal Grandmother  Social History Social History   Tobacco Use  . Smoking status: Former Smoker    Types: Cigarettes  . Smokeless tobacco: Never Used  Substance Use Topics  . Alcohol use: No    Alcohol/week: 0.0 oz  . Drug use: No     Allergies   Patient has no known allergies.   Review of Systems Review of Systems  Constitutional: Negative for fever.  Musculoskeletal: Positive for arthralgias.  Skin: Negative for color change.  Neurological: Negative for weakness and numbness.     Physical Exam Updated Vital Signs BP (!) 145/90 (BP Location: Left Arm)   Pulse (!) 59    Temp 98.7 F (37.1 C) (Oral)   Resp 19   Ht 5\' 10"  (1.778 m)   Wt 95.3 kg (210 lb)   SpO2 99%   BMI 30.13 kg/m   Physical Exam  Constitutional: He appears well-developed and well-nourished.  HENT:  Head: Normocephalic and atraumatic.  Right Ear: External ear normal.  Left Ear: External ear normal.  Eyes: Conjunctivae are normal. Right eye exhibits no discharge. Left eye exhibits no discharge. No scleral icterus.  Cardiovascular:  Pulses:      Dorsalis pedis pulses are 2+ on the right side, and 2+ on the left side.       Posterior tibial pulses are 2+ on the right side, and 2+ on the left side.  Pulmonary/Chest: Effort normal. No respiratory distress.  Musculoskeletal:  Left knee: Mild swelling to anterior knee. Appearance otherwise normal. Some scarring to the skin on the proximal aspect of the knee. No obvious deformity. No skin swelling, erythema, heat, fluctuance or break of the skin. TTP over patella and anterior joint line. Active and passive flexion and extension intact. Patient notes patient with maximal flexion and extension. No crepitus. Negative SLR. Negative Lachman's test. No varus or valgus laxity or locking. No TTP of hips or ankles. Compartments soft. Neurovascularly intact distally to site of injury.   Neurological: He is alert. He has normal strength. No sensory deficit.  Gait able but noted to be painful.  Skin: Skin is warm and dry. Capillary refill takes less than 2 seconds. No erythema. No pallor.  No overlying erythema or heat to the knee.  Psychiatric: He has a normal mood and affect.  Nursing note and vitals reviewed.    ED Treatments / Results  Labs (all labs ordered are listed, but only abnormal results are displayed) Labs Reviewed - No data to display  EKG  EKG Interpretation None       Radiology Dg Knee Complete 4 Views Left  Result Date: 12/13/2016 CLINICAL DATA:  Recent fall with knee pain, initial encounter EXAM: LEFT KNEE - COMPLETE 4+  VIEW COMPARISON:  None. FINDINGS: No acute fracture or dislocation is noted. No joint effusion is seen. Mild soft tissue swelling is noted along the inferior aspect of the patella. IMPRESSION: Mild soft tissue swelling without acute bony abnormality. Electronically Signed   By: Alcide Clever M.D.   On: 12/13/2016 12:11    Procedures Procedures (including critical care time) SPLINT APPLICATION Date/Time: 9:32 PM Authorized by: Jacinto Halim Consent: Verbal consent obtained. Risks and benefits: risks, benefits and alternatives were discussed Consent given by: patient Splint applied by: orthopedic technician Location details: left knee Splint type: knee immobilizer Supplies used: knee immobilizer  Post-procedure: The splinted body part was neurovascularly unchanged following the procedure. Patient tolerance: Patient tolerated the procedure well with no immediate complications.    Medications Ordered in  ED Medications - No data to display   Initial Impression / Assessment and Plan / ED Course  I have reviewed the triage vital signs and the nursing notes.  Pertinent labs & imaging results that were available during my care of the patient were reviewed by me and considered in my medical decision making (see chart for details).     41 year old male here with continued left knee pain after injury 3 days ago.  Patient was seen here yesterday and given a knee sleeve, Tylenol after negative x-ray.  Now with continued pain with ambulation and bending, extending of the knee.  Exam with some swelling to the anterior aspect of the knee.  Question prepatellar bursitis as noted yesterday.  Exam otherwise reassuring as above.  Patient cannot take NSAIDs due to renal insufficiency.  Will give Voltaren gel, knee immobilizer, crutches.  Advised price therapy.  Follow up with PCP vs Ortho advised. Return precuations discussed. Patient in agreement with plan and appears safe for discharge.   Final  Clinical Impressions(s) / ED Diagnoses   Final diagnoses:  Acute pain of left knee    ED Discharge Orders        Ordered    diclofenac sodium (VOLTAREN) 1 % GEL  4 times daily     12/14/16 2128       Princella PellegriniMaczis, Elyssa Pendelton M, PA-C 12/14/16 2130    Jacinto HalimMaczis, Giulliana Mcroberts M, PA-C 12/14/16 2132    Lorre NickAllen, Anthony, MD 12/15/16 2026

## 2016-12-14 NOTE — Progress Notes (Signed)
Orthopedic Tech Progress Note Patient Details:  Karenann CaiJohn L Poppell 09-Jan-1976 [id number]  Ortho Devices Type of Ortho Device: Crutches, Knee Immobilizer Ortho Device/Splint Location: LLE Ortho Device/Splint Interventions: Ordered, Application, Adjustment   Jennye MoccasinHughes, Keontre Defino Craig 12/14/2016, 9:22 PM

## 2016-12-14 NOTE — ED Notes (Signed)
ED Provider at bedside. 

## 2016-12-14 NOTE — Discharge Instructions (Signed)
Please read and follow all provided instructions.  You have been seen today for left knee pain  Tests performed today include: An x-ray of the affected area - does NOT show any broken bones or dislocations.  Vital signs. See below for your results today.   Home care instructions: -- *PRICE in the first 24-48 hours after injury: Protect (with brace, splint, sling), if given by your provider Rest Ice- Do not apply ice pack directly to your skin, place towel or similar between your skin and ice/ice pack. Apply ice for 20 min, then remove for 40 min while awake Compression- Wear brace, elastic bandage, splint as directed by your provider Elevate affected extremity above the level of your heart when not walking around for the first 24-48 hours   Use Tylenol and Voltaren gel for pain.  Follow-up instructions: Please follow-up with your primary care provider or the provided orthopedic physician (bone specialist) if you continue to have significant pain in 1 week. In this case you may have a more severe injury that requires further care.   Return instructions:  Please return if your toes or feet are numb or tingling, appear gray or blue, or you have severe pain (also elevate the leg and loosen splint or wrap if you were given one) Please return to the Emergency Department if you experience worsening symptoms.  Please return if you have any other emergent concerns. Additional Information:  Your vital signs today were: BP (!) 145/90 (BP Location: Left Arm)    Pulse (!) 59    Temp 98.7 F (37.1 C) (Oral)    Resp 19    Ht 5\' 10"  (1.778 m)    Wt 95.3 kg (210 lb)    SpO2 99%    BMI 30.13 kg/m  If your blood pressure (BP) was elevated above 135/85 this visit, please have this repeated by your doctor within one month. ---------------

## 2016-12-24 ENCOUNTER — Emergency Department (HOSPITAL_COMMUNITY): Payer: Medicaid Other

## 2016-12-24 ENCOUNTER — Encounter (HOSPITAL_COMMUNITY): Payer: Self-pay | Admitting: *Deleted

## 2016-12-24 ENCOUNTER — Other Ambulatory Visit: Payer: Self-pay

## 2016-12-24 ENCOUNTER — Emergency Department (HOSPITAL_COMMUNITY)
Admission: EM | Admit: 2016-12-24 | Discharge: 2016-12-24 | Disposition: A | Discharge status: Home / Self Care | Payer: Medicaid Other | Attending: Emergency Medicine | Admitting: Emergency Medicine

## 2016-12-24 DIAGNOSIS — I129 Hypertensive chronic kidney disease with stage 1 through stage 4 chronic kidney disease, or unspecified chronic kidney disease: Secondary | ICD-10-CM | POA: Insufficient documentation

## 2016-12-24 DIAGNOSIS — W228XXA Striking against or struck by other objects, initial encounter: Secondary | ICD-10-CM | POA: Insufficient documentation

## 2016-12-24 DIAGNOSIS — Z87891 Personal history of nicotine dependence: Secondary | ICD-10-CM | POA: Insufficient documentation

## 2016-12-24 DIAGNOSIS — Z79899 Other long term (current) drug therapy: Secondary | ICD-10-CM | POA: Diagnosis not present

## 2016-12-24 DIAGNOSIS — N183 Chronic kidney disease, stage 3 (moderate): Secondary | ICD-10-CM | POA: Diagnosis not present

## 2016-12-24 DIAGNOSIS — M25532 Pain in left wrist: Secondary | ICD-10-CM | POA: Diagnosis present

## 2016-12-24 NOTE — ED Triage Notes (Signed)
Pt reports trying to lift something and now has left wrist pain, hx of injury to same wrist. No acute distress noted at triage.

## 2016-12-24 NOTE — ED Notes (Signed)
Declined W/C at D/C and was escorted to lobby by RN. 

## 2016-12-24 NOTE — Discharge Instructions (Signed)
Tylenol as needed for pain. Follow up with Dr. Amanda PeaGramig if no improvement in pain over the next week. Please follow-up with your provider at the internal medicine clinic to schedule an MRI of your wrist.

## 2016-12-24 NOTE — ED Provider Notes (Signed)
[name] Provider Note   CSN: [id number] Arrival date & time: 12/24/16  1639     History   Chief Complaint Chief Complaint  Patient presents with  . Wrist Pain    HPI Kyle Montgomery is a 41 y.o. male.  Patient states he was attempting to left a heavy object, was unable to hold it, dropping the object with the object striking his left wrist.  Tenderness to the wrist, no deformity noted. Distal pulse, motor, sensation intact.   The history is provided by the patient. No language interpreter was used.  Wrist Pain  This is a new problem. The current episode started 12 to 24 hours ago. The problem has not changed since onset.   Past Medical History:  Diagnosis Date  . Bipolar affective (HCC)    followed by mental health  . Gynecomastia 02/07   normal pituitary by MRI  . 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 Hypertension   . Renal insufficiency    baseline Cr 2.2 - renal US with increased Echo signal, no nephrology eval, no bx    Patient Active Problem List   Diagnosis Date Noted  . Hip pain, left 04/09/2016  . Abnormal finding of diagnostic imaging 04/09/2016  . Sprain of ankle 02/26/2016  . Hyperlipidemia 09/06/2014  . Healthcare maintenance 12/01/2012  . CKD (chronic kidney disease), stage III (HCC) 09/01/2012  . Bipolar disorder (HCC) 11/06/2005  . Essential hypertension 11/06/2005    History reviewed. No pertinent surgical history.     Home Medications    Prior to Admission medications   Medication Sig Start Date End Date Taking? Authorizing Provider  acetaminophen (TYLENOL) 325 MG tablet Take 2 tablets (650 mg total) by mouth every 6 (six) hours as needed. 11/12/16   Ginger CarneHarden, Kyler, MD  acetaminophen (TYLENOL) 500 MG tablet Take 1 tablet (500 mg total) by mouth every 6 (six) hours as needed. 12/13/16   Kirichenko, Tatyana, PA-C  amLODipine (NORVASC) 10 MG tablet Take 1 tablet (10  mg total) by mouth daily. 11/12/16   Ginger CarneHarden, Kyler, MD  atorvastatin (LIPITOR) 20 MG tablet Take 1 tablet (20 mg total) by mouth daily. 11/12/16   Ginger CarneHarden, Kyler, MD  diclofenac sodium (VOLTAREN) 1 % GEL Apply 4 g topically 4 (four) times daily. 12/14/16   Maczis, Elmer SowMichael M, PA-C  divalproex (DEPAKOTE ER) 500 MG 24 hr tablet Take 1 tablet (500 mg total) by mouth at bedtime. 11/12/16   Ginger CarneHarden, Kyler, MD  lisinopril-hydrochlorothiazide (PRINZIDE,ZESTORETIC) 20-12.5 MG tablet Take 2 tablets by mouth daily. 11/12/16 11/12/17  Ginger CarneHarden, Kyler, MD  ziprasidone (GEODON) 80 MG capsule Take 1 capsule (80 mg total) by mouth 2 (two) times daily with a meal. 11/12/16   Ginger CarneHarden, Kyler, MD    Family History Family History  Problem Relation Age of Onset  . Hyperlipidemia Mother   . Hypertension Mother   . Atrial fibrillation Mother   . Hypertension Brother   . Hypertension Maternal Grandmother   . Cancer Maternal Grandmother     Social History Social History   Tobacco Use  . Smoking status: Former Smoker    Types: Cigarettes  . Smokeless tobacco: Never Used  Substance Use Topics  . Alcohol use: No    Alcohol/week: 0.0 oz  . Drug use: No     Allergies   Patient has no known allergies.   Review of Systems Review of Systems  Musculoskeletal: Positive for arthralgias.  All other  systems reviewed and are negative.    Physical Exam Updated Vital Signs BP (!) 141/97 (BP Location: Right Arm)   Pulse 85   Temp 99 F (37.2 C) (Oral)   Resp 18   SpO2 100%   Physical Exam  Constitutional: He is oriented to person, place, and time. He appears well-developed and well-nourished. No distress.  HENT:  Head: Normocephalic.  Eyes: Conjunctivae are normal.  Cardiovascular: Normal rate and regular rhythm.  Pulmonary/Chest: Effort normal and breath sounds normal.  Abdominal: Soft. He exhibits no distension. There is no tenderness.  Musculoskeletal: He exhibits tenderness. He exhibits no  deformity.  Neurological: He is alert and oriented to person, place, and time.  Skin: Skin is warm and dry.  Psychiatric: He has a normal mood and affect.  Nursing note and vitals reviewed.    ED Treatments / Results  Labs (all labs ordered are listed, but only abnormal results are displayed) Labs Reviewed - No data to display  EKG  EKG Interpretation None       Radiology Dg Wrist Complete Left  Result Date: 12/24/2016 CLINICAL DATA:  Left wrist pain after lifting injury. EXAM: LEFT WRIST - COMPLETE 3+ VIEW COMPARISON:  Radiographs of Jun 06, 2016. FINDINGS: There is no evidence of fracture or dislocation. There is no evidence of arthropathy. 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 Soft tissues are unremarkable. IMPRESSION: No acute fracture or dislocation is noted. Stable probable osteochondral lesion seen in the lunate; further evaluation with MRI is recommended. Electronically Signed   By: Lupita RaiderJames  Green Jr, M.D.   On: 12/24/2016 17:54    Procedures Procedures (including critical care time)  Medications Ordered in ED Medications - No data to display   Initial Impression / Assessment and Plan / ED Course  I have reviewed the triage vital signs and the nursing notes.  Pertinent labs & imaging results that were available during my care of the patient were reviewed by me and considered in my medical decision making (see chart for details).    Stable lesion of left wrist. Will request patient follow-up with his PCP for out-patient MRI.  Patient X-Ray negative for obvious fracture or dislocation.  Pt advised to follow up with orthopedics. Patient given wrist splint while in ED, conservative therapy recommended and discussed. Patient will be discharged home & is agreeable with above plan. Patient to follow-up with his PCP for outpatient MRI evaluation of stable osseous lesion in the lunate. Returns precautions discussed. Pt appears safe for discharge.  Final  Clinical Impressions(s) / ED Diagnoses   Final diagnoses:  Left wrist pain    ED Discharge Orders    None       Felicie MornSmith, Anniece Bleiler, NP 12/25/16 [phone]    Pricilla LovelessGoldston, Scott, MD 12/25/16 2006

## 2017-02-27 ENCOUNTER — Other Ambulatory Visit: Payer: Self-pay

## 2017-02-27 ENCOUNTER — Encounter (HOSPITAL_COMMUNITY): Payer: Self-pay | Admitting: *Deleted

## 2017-02-27 ENCOUNTER — Emergency Department (HOSPITAL_COMMUNITY)
Admission: EM | Admit: 2017-02-27 | Discharge: 2017-02-27 | Disposition: A | Discharge status: Home / Self Care | Payer: Medicaid Other | Attending: Emergency Medicine | Admitting: Emergency Medicine

## 2017-02-27 DIAGNOSIS — Z87891 Personal history of nicotine dependence: Secondary | ICD-10-CM | POA: Insufficient documentation

## 2017-02-27 DIAGNOSIS — F319 Bipolar disorder, unspecified: Secondary | ICD-10-CM | POA: Diagnosis not present

## 2017-02-27 DIAGNOSIS — Z79899 Other long term (current) drug therapy: Secondary | ICD-10-CM | POA: Diagnosis not present

## 2017-02-27 DIAGNOSIS — I129 Hypertensive chronic kidney disease with stage 1 through stage 4 chronic kidney disease, or unspecified chronic kidney disease: Secondary | ICD-10-CM | POA: Diagnosis not present

## 2017-02-27 DIAGNOSIS — G8929 Other chronic pain: Secondary | ICD-10-CM | POA: Insufficient documentation

## 2017-02-27 DIAGNOSIS — M25532 Pain in left wrist: Secondary | ICD-10-CM | POA: Diagnosis not present

## 2017-02-27 DIAGNOSIS — M79642 Pain in left hand: Secondary | ICD-10-CM | POA: Diagnosis present

## 2017-02-27 DIAGNOSIS — N183 Chronic kidney disease, stage 3 (moderate): Secondary | ICD-10-CM | POA: Diagnosis not present

## 2017-02-27 MED ORDER — DICLOFENAC SODIUM 1 % TD GEL
4.0000 g | Freq: Four times a day (QID) | TRANSDERMAL | 0 refills | Status: DC
Start: 2017-02-27 — End: 2018-09-16

## 2017-02-27 NOTE — Discharge Instructions (Signed)
Please read and follow all provided instructions.  Your diagnoses today include:  1. Left wrist pain     Tests performed today include: Vital signs. See below for your results today.   Medications prescribed:   Take any prescribed medications only as directed.  Home care instructions:  Follow any educational materials contained in this packet Wear your splint for at least one week or until seen by a physician for a follow-up examination. Follow R.I.C.E. Protocol: R - rest your injury  I  - use ice on injury without applying directly to skin C - compress injury with bandage or splint E - elevate the injury above the level of your heart as much as possible to reduce pain and swelling  Follow-up instructions: Please follow-up with your primary care provider or the provided orthopedic (bone specialist) if you continue to have significant pain or trouble using your wrist in 1 week. In this case you may have a severe injury that requires further care.    Return instructions:  Please return if your fingers are numb or tingling, appear very red, white, gray or blue, or you have severe pain (also elevate wrist and loosen splint or wrap) Please return if you have difficulty moving your fingers. Please return to the Emergency Department if you experience worsening symptoms.  Please return if you have any other emergent concerns.  Additional Information:  Your vital signs today were: BP (!) 146/105 (BP Location: Right Arm)    Pulse (!) 105    Temp 99 F (37.2 C) (Oral)    Resp 18    Ht 5\' 10"  (1.778 m)    Wt 102.1 kg (225 lb)    SpO2 98%    BMI 32.28 kg/m  If your blood pressure (BP) was elevated above 135/85 this visit, please have this repeated by your doctor within one month. -------------- Wrist injuries are frequent in adults and children. A sprain is an injury to the ligaments that hold your bones together. A strain is an injury to muscle or muscle tendons (cord like structure) from  stretching or pulling.   Remember the importance of follow-up and possible follow-up x-rays. Improvement in pain level is not 100% insurance of not having a fracture. --------------

## 2017-02-27 NOTE — ED Provider Notes (Signed)
[name] EMERGENCY DEPARTMENT Provider Note   CSN: [id number] Arrival date & time: 02/27/17  1191     History   Chief Complaint Chief Complaint  Patient presents with  . Hand Pain    HPI SID Kyle Montgomery is a 42 y.o. male.  HPI   Patient is a 42 year old male with a history of bipolar disorder, hyperprolactinemia, hypertension, and CKD stage III presenting for chronic left hand pain.  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  Patient reports that he has had left wrist and hand pain for several months.  Patient reports it is intermittent in nature, and not particularly bothering him at this time.  Patient denies any weakness of the intrinsic muscles of the left hand, or numbness in his hand.  Patient denies any discoloration or changes in color to the left hand.  Patient has been taking Tylenol without full relief.  Past Medical History:  Diagnosis Date  . Bipolar affective (HCC)    followed by mental health  . Gynecomastia 02/07   normal pituitary by MRI  . 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 Hypertension   . Renal insufficiency    baseline Cr 2.2 - renal US with increased Echo signal, no nephrology eval, no bx    Patient Active Problem List   Diagnosis Date Noted  . Hip pain, left 04/09/2016  . Abnormal finding of diagnostic imaging 04/09/2016  . Sprain of ankle 02/26/2016  . Hyperlipidemia 09/06/2014  . Healthcare maintenance 12/01/2012  . CKD (chronic kidney disease), stage III (HCC) 09/01/2012  . Bipolar disorder (HCC) 11/06/2005  . Essential hypertension 11/06/2005    History reviewed. No pertinent surgical history.     Home Medications    Prior to Admission medications   Medication Sig Start Date End Date Taking? Authorizing Provider  acetaminophen (TYLENOL) 325 MG tablet Take 2 tablets  (650 mg total) by mouth every 6 (six) hours as needed. 11/12/16   Ginger Carne, MD  acetaminophen (TYLENOL) 500 MG tablet Take 1 tablet (500 mg total) by mouth every 6 (six) hours as needed. 12/13/16   Kirichenko, Tatyana, PA-C  amLODipine (NORVASC) 10 MG tablet Take 1 tablet (10 mg total) by mouth daily. 11/12/16   Ginger Carne, MD  atorvastatin (LIPITOR) 20 MG tablet Take 1 tablet (20 mg total) by mouth daily. 11/12/16   Ginger Carne, MD  diclofenac sodium (VOLTAREN) 1 % GEL Apply 4 g topically 4 (four) times daily. 12/14/16   Maczis, Elmer Sow, PA-C  divalproex (DEPAKOTE ER) 500 MG 24 hr tablet Take 1 tablet (500 mg total) by mouth at bedtime. 11/12/16   Ginger Carne, MD  lisinopril-hydrochlorothiazide (PRINZIDE,ZESTORETIC) 20-12.5 MG tablet Take 2 tablets by mouth daily. 11/12/16 11/12/17  Ginger Carne, MD  ziprasidone (GEODON) 80 MG capsule Take 1 capsule (80 mg total) by mouth 2 (two) times daily with a meal. 11/12/16   Ginger Carne, MD    Family History Family History  Problem Relation Age of Onset  . Hyperlipidemia Mother   . Hypertension Mother   . Atrial fibrillation Mother   . Hypertension Brother   . Hypertension Maternal Grandmother   . Cancer Maternal Grandmother     Social History Social History   Tobacco Use  . Smoking status: Former Smoker    Types: Cigarettes  . Smokeless tobacco: Never Used  Substance Use Topics  . Alcohol use: No    Alcohol/week: 0.0 oz  . Drug use: No     Allergies   Patient has no known allergies.   Review of Systems Review of Systems  Musculoskeletal: Positive for arthralgias. Negative for joint swelling.  Skin: Negative for color change.  Neurological: Negative for weakness and numbness.     Physical Exam Updated Vital Signs BP (!) 146/105 (BP Location: Right Arm)   Pulse (!) 105   Temp 99 F (37.2 C) (Oral)   Resp 18   Ht 5\' 10"  (1.778 m)   Wt 102.1 kg (225 lb)   SpO2 98%   BMI 32.28 kg/m   Physical Exam    Constitutional: He appears well-developed and well-nourished. No distress.  Sitting comfortably in bed.  HENT:  Head: Normocephalic and atraumatic.  Eyes: Conjunctivae are normal. Right eye exhibits no discharge. Left eye exhibits no discharge.  EOMs normal to gross examination.  Neck: Normal range of motion.  Cardiovascular: Normal rate and regular rhythm.  Intact, 2+ radial pulse of LUE  Pulmonary/Chest:  Normal respiratory effort. Patient converses comfortably. No audible wheeze or stridor.  Abdominal: He exhibits no distension.  Musculoskeletal: Normal range of motion.  Hand Exam:  Inspection: No swelling, deformity, discoloration, or wound.  No boggy joints or nodes noted. Palpation: No point tenderness, crepitus, tenderness over anatomic snuffbox, or scapholunate joint tenderness ROM: Passive/active ROM intact at wrist Flexor/Extensor tendons: FDS/FDP tendons intact in digits 2-5 at PIP/DIP joints, respectively; extensor tendons intact in all digits Nerve testing: Sensation intact to light touch in all digits.  Vascular: 2+ radial and ulnar pulses. Capillary refill <2 seconds b/l.  Neurological: He is alert.  Cranial nerves intact to gross observation. Patient moves extremities without difficulty.  Skin: Skin is warm and dry. He is not diaphoretic.  Psychiatric: His behavior is normal. Judgment and thought content normal.  Patient exhibits a flat affect.  Nursing note and vitals reviewed.    ED Treatments / Results  Labs (all labs ordered are listed, but only abnormal results are displayed) Labs Reviewed - No data to display  EKG  EKG Interpretation None       Radiology No results found.  Procedures Procedures (including critical care time)  Medications Ordered in ED Medications - No data to display   Initial Impression / Assessment and Plan / ED Course  I have reviewed the triage vital signs and the nursing notes.  Pertinent labs & imaging results that  were available during my care of the patient were reviewed by me and considered in my medical decision making (see chart for details).     Patient is well-appearing and in no acute distress.  Left wrist and hand are neurovascularly intact.  Patient is presenting with acute on chronic left wrist and hand pain.  This has been previously evaluated by imaging recently, and do not feel that given that patient is not having maximal symptoms today this needs to be repeated.  Patient presents with a volar splint in place.  Given that patient has CKD, I discussed avoiding systemic NSAIDs and encouraging Tylenol.  I also represcribed patient's diclofenac gel, which he reports he has not used before.  Patient can return precautions for any pallor, paresthesias, or change in functioning of the left upper extremity.  Encouraged orthopedic follow-up and primary care follow-up.  Patient is in understanding and agrees with plan of care.  Final Clinical Impressions(s) / ED Diagnoses   Final  diagnoses:  Left wrist pain    ED Discharge Orders        Ordered    diclofenac sodium (VOLTAREN) 1 % GEL  4 times daily     02/27/17 1040       Elisha PonderMurray, Channie Bostick B, PA-C 02/27/17 1839    Delia ChimesMurray, Carr Shartzer B, PA-C 02/27/17 1839    Little, Ambrose Finlandachel Morgan, MD 02/28/17 [phone]

## 2017-02-27 NOTE — ED Triage Notes (Signed)
To ED for left hand pain. States he needs a script for this pain- hx of arthritis.

## 2017-03-14 ENCOUNTER — Emergency Department (HOSPITAL_COMMUNITY)
Admission: EM | Admit: 2017-03-14 | Discharge: 2017-03-15 | Disposition: A | Discharge status: Home / Self Care | Payer: Medicaid Other | Attending: Emergency Medicine | Admitting: Emergency Medicine

## 2017-03-14 ENCOUNTER — Encounter (HOSPITAL_COMMUNITY): Payer: Self-pay

## 2017-03-14 ENCOUNTER — Other Ambulatory Visit: Payer: Self-pay

## 2017-03-14 DIAGNOSIS — Z5321 Procedure and treatment not carried out due to patient leaving prior to being seen by health care provider: Secondary | ICD-10-CM | POA: Insufficient documentation

## 2017-03-14 DIAGNOSIS — R110 Nausea: Secondary | ICD-10-CM | POA: Insufficient documentation

## 2017-03-14 NOTE — ED Triage Notes (Signed)
Pt states he started feeling nauseous today, denies any other complaints. Concerned it could be a side effect of one of his medications.

## 2017-03-15 NOTE — ED Notes (Signed)
No answer in waiting area.

## 2017-03-15 NOTE — ED Notes (Signed)
No answer in waiting room 

## 2017-03-15 NOTE — ED Notes (Signed)
Pt did not answer for vitals.  

## 2017-04-15 ENCOUNTER — Encounter: Payer: Self-pay | Admitting: Internal Medicine

## 2017-04-15 ENCOUNTER — Ambulatory Visit (INDEPENDENT_AMBULATORY_CARE_PROVIDER_SITE_OTHER): Payer: Medicaid Other | Admitting: Internal Medicine

## 2017-04-15 VITALS — BP 128/90 | HR 72 | Temp 98.1°F | Ht 70.0 in | Wt 198.2 lb

## 2017-04-15 DIAGNOSIS — F3170 Bipolar disorder, currently in remission, most recent episode unspecified: Secondary | ICD-10-CM

## 2017-04-15 DIAGNOSIS — E119 Type 2 diabetes mellitus without complications: Secondary | ICD-10-CM | POA: Diagnosis not present

## 2017-04-15 DIAGNOSIS — R9389 Abnormal findings on diagnostic imaging of other specified body structures: Secondary | ICD-10-CM

## 2017-04-15 DIAGNOSIS — I129 Hypertensive chronic kidney disease with stage 1 through stage 4 chronic kidney disease, or unspecified chronic kidney disease: Secondary | ICD-10-CM | POA: Diagnosis present

## 2017-04-15 DIAGNOSIS — I10 Essential (primary) hypertension: Secondary | ICD-10-CM

## 2017-04-15 DIAGNOSIS — N183 Chronic kidney disease, stage 3 unspecified: Secondary | ICD-10-CM

## 2017-04-15 LAB — POCT GLYCOSYLATED HEMOGLOBIN (HGB A1C): Hemoglobin A1C: 5.3

## 2017-04-15 LAB — GLUCOSE, CAPILLARY: Glucose-Capillary: 72 mg/dL (ref 65–99)

## 2017-04-15 NOTE — Assessment & Plan Note (Signed)
He reports his mood is stable on his current regimen with no complaints of manic/hypomanic symptoms or depressed mood.  On literature review, he is on an appropriate dose of Depakote and while headache is a known side effect, given his isolated headache which spontaneously resolved, this would not warrant a change in his effective bipolar regimen.  Continue current regimen without changes

## 2017-04-15 NOTE — Progress Notes (Signed)
   CC: Routine follow-up, medication question  HPI:  Mr.Traquan L Kathrynn RunningManning is a 42 y.o. male with past medical history of hypertension, chronic kidney disease, bipolar disorder who presents to the clinic for routine follow-up and a question regarding his medication.  Bipolar disorder: He reports his mood is stable and denies depressive or manic symptoms, no issues with sleep, no restlessness.  He states during ED visit he mentioned a headache which in ED provider noted could be a side effect of his valproic acid.  He states the headache occurred one time and spontaneously resolved on its own.  No further complaints of headache.  He has continued to take his valproic acid regularly.  Hypertension: Reports good medication adherence, denies complaints.  Diabetes: On chart review the patient had an elevated A1c in February 2017, which was noted but no further intervention or follow-up appears to have been performed.  He reports his diet contains common meals of chicken, grilled cheese, bologna, [place] fries, [place] food and [place].  He avoids sodas but drinks juices regularly.  Wrist pain: He was seen on several occasions in the ED for left wrist pain since his last visit, states that this was due to overuse and has since resolved.  Past Medical History:  Diagnosis Date  . Bipolar affective (HCC)    followed by mental health  . Gynecomastia 02/07   normal pituitary by MRI  . 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 Hypertension   . Renal insufficiency    baseline Cr 2.2 - renal US with increased Echo signal, no nephrology eval, no bx   Review of Systems:  Review of Systems  Musculoskeletal: Negative for joint pain.  Neurological: Negative for focal weakness and headaches.  Psychiatric/Behavioral: Negative for depression. The patient is not nervous/anxious and does not have insomnia.      Physical Exam:  Vitals:   04/15/17 1347  BP: 128/90  Pulse:  72  Temp: 98.1 F (36.7 C)  TempSrc: Oral  SpO2: 100%  Weight: 198 lb 3.2 oz (89.9 kg)  Height: 5\' 10"  (1.778 m)   General: Sitting in chair comfortably, no acute distress HEENT: Normal conjuctiva, normocephalic, atraumatic CV: RRR, no murmur appreciated Resp: Clear breath sounds bilaterally, normal work of breathing, no distress  Abd: Soft, +BS, non-tender  Extr: No bony tenderness to L wrist, full ROM and strength, good peripheral pulses Neuro: Alert and oriented x3  Psych: Flat affect, no pressured speech or restlessness  Skin: Warm, dry      Assessment & Plan:   See Encounters Tab for problem based charting.  Patient discussed with Dr. Oswaldo DoneVincent

## 2017-04-15 NOTE — Assessment & Plan Note (Signed)
Blood pressure well controlled today at 128/90, current regimen includes lisinopril-HCTZ 40-25 daily, amlodipine 10 mg.  Continue current regimen without changes

## 2017-04-15 NOTE — Assessment & Plan Note (Addendum)
Patient had a A1c of 6.6 in February 2017, however this was not rechecked, no interventions appear to have been made in interval.  His diet does not sound ideal for glycemic control, education and counseling provided on increasing vegetable intake while limiting foods with high sugar or carbohydrates.  Today, we will recheck an A1c for updated information regarding his glycemic control, will also obtain urine for proteinuria assessment.  On follow-up, he will also need foot and eye exam to get up-to-date on recommended diabetes care.  We will continue to provide education and counseling as well.  Depending on A1c result, will consider initiating therapy,, though agent of choice may depend on updated renal function information.  --Repeat A1c --Micoralbumin: Cr urine ratio  **A1c returned at 5.3, back within normal range. Will continue with diagnosis of diabetes type 2 and regard this as diet controlled with periodic recheck of A1c

## 2017-04-15 NOTE — Assessment & Plan Note (Addendum)
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  Last creatinine on chart review was in June 2018 and was 1.95, GFR 47.  His blood pressure appears to have been well controlled, however he may have untreated diabetes.  Will repeat BMP for updated information on renal function and electrolytes.  --BMP  **Cr and GFR stable compared to prior, Cr 1.9, GFR 49. Will continue to monitor and strive for good control of HTN

## 2017-04-15 NOTE — Assessment & Plan Note (Addendum)
He was previously noted to have a sclerotic area of the left iliac wing that may have been a benign bone island given absence of more worrying imaging features such as destructive changes.  However, given patient's race as a risk factor, we feel that a screening PSA may be helpful to ensure prostatic disease is not being overlooked.  **PSA returned 0.8 (wnl), reassuring that the previously imaged bone process is more likely to be benign

## 2017-04-15 NOTE — Patient Instructions (Signed)
Good to see you again today [name].  The dose of your bipolar medicine is the right dose for you and because your headache has gone away on its own I think that it is less likely to be the medicine as the cause of this headache. Keep doing a great job with taking your medicine regularly.  Your blood pressure looks great today and again, you are doing great job with your medicines.  A lab test a few years ago noticed that your blood sugar may be running a little high.  We will get some updated labs to check this and also check your kidney and prostate numbers.  I will call you with the results in case there are any new medicines that we would need to use to improve your blood sugar.  Try to eat more vegetables and less foods with high sugar and less bread and rice.  It can also help to drink more water and less juice.  I would like to see you back in about 3 months to check how you are doing

## 2017-04-16 LAB — BMP8+ANION GAP
ANION GAP: 19 mmol/L — AB (ref 10.0–18.0)
BUN/Creatinine Ratio: 16 (ref 9–20)
BUN: 30 mg/dL — ABNORMAL HIGH (ref 6–24)
CALCIUM: 9.9 mg/dL (ref 8.7–10.2)
CHLORIDE: 104 mmol/L (ref 96–106)
CO2: 23 mmol/L (ref 20–29)
Creatinine, Ser: 1.9 mg/dL — ABNORMAL HIGH (ref 0.76–1.27)
GFR calc non Af Amer: 43 mL/min/{1.73_m2} — ABNORMAL LOW (ref >59)
GFR, EST AFRICAN AMERICAN: 49 mL/min/{1.73_m2} — AB (ref >59)
GLUCOSE: 79 mg/dL (ref 65–99)
POTASSIUM: 3.5 mmol/L (ref 3.5–5.2)
SODIUM: 146 mmol/L — AB (ref 134–144)

## 2017-04-16 LAB — MICROALBUMIN / CREATININE URINE RATIO
Creatinine, Urine: 53.3 mg/dL
Microalb/Creat Ratio: 373.4 mg/g creat — ABNORMAL HIGH (ref 0.0–30.0)
Microalbumin, Urine: 199 ug/mL

## 2017-04-16 LAB — PSA: Prostate Specific Ag, Serum: 0.8 ng/mL (ref 0.0–4.0)

## 2017-04-16 NOTE — Progress Notes (Signed)
Internal Medicine Clinic Attending  Case discussed with Dr. Harden at the time of the visit.  We reviewed the resident's history and exam and pertinent patient test results.  I agree with the assessment, diagnosis, and plan of care documented in the resident's note.  

## 2017-04-17 ENCOUNTER — Telehealth: Payer: Self-pay | Admitting: Internal Medicine

## 2017-04-17 NOTE — Telephone Encounter (Signed)
Patient calling checking on results from yesterday

## 2017-04-18 NOTE — Telephone Encounter (Signed)
Returned pt call and informed of lab results--stable renal function, improved A1c, normal PSA

## 2017-04-19 ENCOUNTER — Emergency Department (HOSPITAL_COMMUNITY)
Admission: EM | Admit: 2017-04-19 | Discharge: 2017-04-19 | Disposition: A | Discharge status: Home / Self Care | Payer: Medicaid Other | Attending: Emergency Medicine | Admitting: Emergency Medicine

## 2017-04-19 ENCOUNTER — Encounter (HOSPITAL_COMMUNITY): Payer: Self-pay

## 2017-04-19 ENCOUNTER — Emergency Department (HOSPITAL_COMMUNITY): Payer: Medicaid Other

## 2017-04-19 DIAGNOSIS — I129 Hypertensive chronic kidney disease with stage 1 through stage 4 chronic kidney disease, or unspecified chronic kidney disease: Secondary | ICD-10-CM | POA: Insufficient documentation

## 2017-04-19 DIAGNOSIS — S9032XA Contusion of left foot, initial encounter: Secondary | ICD-10-CM | POA: Diagnosis not present

## 2017-04-19 DIAGNOSIS — W2209XA Striking against other stationary object, initial encounter: Secondary | ICD-10-CM | POA: Insufficient documentation

## 2017-04-19 DIAGNOSIS — N183 Chronic kidney disease, stage 3 (moderate): Secondary | ICD-10-CM | POA: Diagnosis not present

## 2017-04-19 DIAGNOSIS — Y929 Unspecified place or not applicable: Secondary | ICD-10-CM | POA: Diagnosis not present

## 2017-04-19 DIAGNOSIS — Y939 Activity, unspecified: Secondary | ICD-10-CM | POA: Insufficient documentation

## 2017-04-19 DIAGNOSIS — Y999 Unspecified external cause status: Secondary | ICD-10-CM | POA: Diagnosis not present

## 2017-04-19 DIAGNOSIS — Z79899 Other long term (current) drug therapy: Secondary | ICD-10-CM | POA: Diagnosis not present

## 2017-04-19 DIAGNOSIS — S99822A Other specified injuries of left foot, initial encounter: Secondary | ICD-10-CM | POA: Diagnosis present

## 2017-04-19 DIAGNOSIS — E1122 Type 2 diabetes mellitus with diabetic chronic kidney disease: Secondary | ICD-10-CM | POA: Insufficient documentation

## 2017-04-19 DIAGNOSIS — Z87891 Personal history of nicotine dependence: Secondary | ICD-10-CM | POA: Diagnosis not present

## 2017-04-19 NOTE — ED Triage Notes (Signed)
Patient complains of left foot pain, especially heel pain after hitting ffot yesterday, no obvious deformity, no swelling

## 2017-04-19 NOTE — ED Provider Notes (Signed)
[name] Provider Note   CSN: [id number] Arrival date & time: 04/19/17  1751     History   Chief Complaint No chief complaint on file.   HPI Karenann CaiJohn L Tavares is a 42 y.o. male who presents to the ED with foot pain. Patient reports that he slammed his foot on the floor causing pain to the back part of his heel. Patient denies any other injuries.   HPI  Past Medical History:  Diagnosis Date  . Bipolar affective (HCC)    followed by mental health  . Gynecomastia 02/07   normal pituitary by MRI  . 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 Hypertension   . Renal insufficiency    baseline Cr 2.2 - renal US with increased Echo signal, no nephrology eval, no bx    Patient Active Problem List   Diagnosis Date Noted  . Type 2 diabetes, diet controlled (HCC) 04/15/2017  . Hip pain, left 04/09/2016  . Abnormal finding of diagnostic imaging 04/09/2016  . Sprain of ankle 02/26/2016  . Hyperlipidemia 09/06/2014  . Healthcare maintenance 12/01/2012  . CKD (chronic kidney disease), stage III (HCC) 09/01/2012  . Bipolar disorder (HCC) 11/06/2005  . Essential hypertension 11/06/2005    History reviewed. No pertinent surgical history.      Home Medications    Prior to Admission medications   Medication Sig Start Date End Date Taking? Authorizing Provider  acetaminophen (TYLENOL) 325 MG tablet Take 2 tablets (650 mg total) by mouth every 6 (six) hours as needed. 11/12/16   Ginger CarneHarden, Kyler, MD  acetaminophen (TYLENOL) 500 MG tablet Take 1 tablet (500 mg total) by mouth every 6 (six) hours as needed. 12/13/16   Kirichenko, Tatyana, PA-C  amLODipine (NORVASC) 10 MG tablet Take 1 tablet (10 mg total) by mouth daily. 11/12/16   Ginger CarneHarden, Kyler, MD  atorvastatin (LIPITOR) 20 MG tablet Take 1 tablet (20 mg total) by mouth daily. 11/12/16   Ginger CarneHarden, Kyler, MD  diclofenac sodium (VOLTAREN) 1 % GEL Apply 4 g topically 4 (four)  times daily. 02/27/17   Aviva KluverMurray, Alyssa B, PA-C  divalproex (DEPAKOTE ER) 500 MG 24 hr tablet Take 1 tablet (500 mg total) by mouth at bedtime. 11/12/16   Ginger CarneHarden, Kyler, MD  lisinopril-hydrochlorothiazide (PRINZIDE,ZESTORETIC) 20-12.5 MG tablet Take 2 tablets by mouth daily. 11/12/16 11/12/17  Ginger CarneHarden, Kyler, MD  ziprasidone (GEODON) 80 MG capsule Take 1 capsule (80 mg total) by mouth 2 (two) times daily with a meal. 11/12/16   Ginger CarneHarden, Kyler, MD    Family History Family History  Problem Relation Age of Onset  . Hyperlipidemia Mother   . Hypertension Mother   . Atrial fibrillation Mother   . Hypertension Brother   . Hypertension Maternal Grandmother   . Cancer Maternal Grandmother     Social History Social History   Tobacco Use  . Smoking status: Former Smoker    Types: Cigarettes  . Smokeless tobacco: Never Used  Substance Use Topics  . Alcohol use: No    Alcohol/week: 0.0 oz  . Drug use: No     Allergies   Patient has no known allergies.   Review of Systems Review of Systems  Musculoskeletal: Positive for arthralgias.  All other systems reviewed and are negative.    Physical Exam Updated Vital Signs BP 140/83 (BP Location: Right Arm)   Pulse 70   Temp 98.5 F (36.9 C) (Oral)   Resp 20   Physical Exam  Constitutional: He appears well-developed and well-nourished. No distress.  HENT:  Head: Normocephalic.  Eyes: EOM are normal.  Neck: Neck supple.  Cardiovascular: Normal rate.  Pulmonary/Chest: Effort normal.  Musculoskeletal: Normal range of motion.       Left ankle: He exhibits normal range of motion, no swelling, no deformity, no laceration and normal pulse. Achilles tendon exhibits no pain, no defect and normal Thompson's test results.       Left foot: There is tenderness. There is normal range of motion, no swelling, normal capillary refill, no deformity and no laceration.       Feet:  Neurological: He is alert.  Skin: Skin is warm and dry.    Psychiatric: He has a normal mood and affect.  Nursing note and vitals reviewed.    ED Treatments / Results  Labs (all labs ordered are listed, but only abnormal results are displayed) Labs Reviewed - No data to display  EKG None  Radiology Dg Foot Complete Left  Result Date: 04/19/2017 CLINICAL DATA:  Left heel pain after injury yesterday. EXAM: LEFT FOOT - COMPLETE 3+ VIEW COMPARISON:  Radiographs March 30, 2015. FINDINGS: There is no evidence of fracture or dislocation. There is no evidence of arthropathy or other focal bone abnormality. Soft tissues are unremarkable. IMPRESSION: Normal left foot. Electronically Signed   By: Lupita Raider, M.D.   On: 04/19/2017 19:08    Procedures Procedures (including critical care time)  Medications Ordered in ED Medications - No data to display   Initial Impression / Assessment and Plan / ED Course  I have reviewed the triage vital signs and the nursing notes. 42 y.o. male with left heel pain s/p hitting it on the floor stable for d/c without fracture or dislocation and no focal neuro deficits. Ace wrap and tylenol. F/u with PCP. Return precautions discussed.   Final Clinical Impressions(s) / ED Diagnoses   Final diagnoses:  Contusion of left foot, initial encounter    ED Discharge Orders    None       Kerrie [place], [place] 04/19/17 2124    Shaune Pollack, MD 04/20/17 1121

## 2017-04-19 NOTE — Discharge Instructions (Addendum)
Take tylenol as needed for pain. Follow up with your doctor. Return here as needed. °

## 2017-04-30 ENCOUNTER — Telehealth: Payer: Self-pay | Admitting: Internal Medicine

## 2017-04-30 NOTE — Telephone Encounter (Signed)
PATIENT CALLED WILL BRING HIS PAST DUE COPAY AT NEXT VISIT ON 05/13/17

## 2017-05-13 ENCOUNTER — Encounter: Payer: Medicaid Other | Admitting: Internal Medicine

## 2017-05-22 ENCOUNTER — Encounter: Payer: Self-pay | Admitting: *Deleted

## 2017-05-27 ENCOUNTER — Encounter: Payer: Medicaid Other | Admitting: Internal Medicine

## 2017-06-25 ENCOUNTER — Emergency Department (HOSPITAL_COMMUNITY)
Admission: EM | Admit: 2017-06-25 | Discharge: 2017-06-25 | Disposition: A | Discharge status: Home / Self Care | Payer: Medicaid Other | Attending: Emergency Medicine | Admitting: Emergency Medicine

## 2017-06-25 ENCOUNTER — Encounter (HOSPITAL_COMMUNITY): Payer: Self-pay | Admitting: Emergency Medicine

## 2017-06-25 DIAGNOSIS — R6889 Other general symptoms and signs: Secondary | ICD-10-CM | POA: Diagnosis not present

## 2017-06-25 DIAGNOSIS — Z87891 Personal history of nicotine dependence: Secondary | ICD-10-CM | POA: Insufficient documentation

## 2017-06-25 DIAGNOSIS — E1122 Type 2 diabetes mellitus with diabetic chronic kidney disease: Secondary | ICD-10-CM | POA: Insufficient documentation

## 2017-06-25 DIAGNOSIS — R0989 Other specified symptoms and signs involving the circulatory and respiratory systems: Secondary | ICD-10-CM | POA: Diagnosis not present

## 2017-06-25 DIAGNOSIS — N183 Chronic kidney disease, stage 3 (moderate): Secondary | ICD-10-CM | POA: Diagnosis not present

## 2017-06-25 DIAGNOSIS — Z79899 Other long term (current) drug therapy: Secondary | ICD-10-CM | POA: Diagnosis not present

## 2017-06-25 DIAGNOSIS — I129 Hypertensive chronic kidney disease with stage 1 through stage 4 chronic kidney disease, or unspecified chronic kidney disease: Secondary | ICD-10-CM | POA: Diagnosis not present

## 2017-06-25 NOTE — ED Provider Notes (Signed)
Patient placed in Quick Look pathway, seen and evaluated   Chief Complaint: "I feel warm inside."   HPI:   42 year old who -presents to the Emergency Department who presents to the Emergency Department who reports to the Emergency Department with a chief complaint of "I feel warm inside" with associated rhinorrhea and intermittent lightheadedness. He denies chills, dyspnea, chest pain, N/V/D/, or rash at this time.   ROS: rhinorrhea, subjective fever, lightheadedness    Physical Exam:   Gen: No distress  Neuro: Awake and Alert  Skin: Warm    Focused Exam: Well-appearing. NAD. Heart is RRR. No m/r/g. Lungs are CTAB.    Initiation of care has begun. The patient has been counseled on the process, plan, and necessity for staying for the completion/evaluation, and the remainder of the medical screening examination    Barkley BoardsMcDonald, Mia A, PA-C 06/25/17 1952    Pricilla LovelessGoldston, Scott, MD 06/30/17 [phone]

## 2017-06-25 NOTE — Discharge Instructions (Addendum)
Please read attached information. If you experience any new or worsening signs or symptoms please return to the emergency room for evaluation. Please follow-up with your primary care provider or specialist as discussed.  °

## 2017-06-25 NOTE — ED Provider Notes (Signed)
[name] EMERGENCY DEPARTMENT Provider Note   CSN: [id number] Arrival date & time: 06/25/17  1832   History   Chief Complaint Chief Complaint  Patient presents with  . "I feel warm inside"    HPI ADDIEL MCCARDLE is a 42 y.o. male.  HPI   42 year old male presents today with complaints of runny nose and feeling warm.  Patient notes that he was told by his primary care to come to the emergency room if he experiences any of the symptoms.  He notes that over the last couple weeks he has had intermittent runny nose, none presently.  Patient also notes that from time to time he feels warm inside.  Patient denies any cough congestion fever chills, denies any nausea vomiting, chest pain shortness of breath abdominal pain, or any fevers.  She notes that he has had no changes in medication recently.  He has no other concerns here.  Past Medical History:  Diagnosis Date  . Bipolar affective (HCC)    followed by mental health  . Gynecomastia 02/07   normal pituitary by MRI  . 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 Hypertension   . Renal insufficiency    baseline Cr 2.2 - renal US with increased Echo signal, no nephrology eval, no bx    Patient Active Problem List   Diagnosis Date Noted  . Type 2 diabetes, diet controlled (HCC) 04/15/2017  . Hip pain, left 04/09/2016  . Abnormal finding of diagnostic imaging 04/09/2016  . Sprain of ankle 02/26/2016  . Hyperlipidemia 09/06/2014  . Healthcare maintenance 12/01/2012  . CKD (chronic kidney disease), stage III (HCC) 09/01/2012  . Bipolar disorder (HCC) 11/06/2005  . Essential hypertension 11/06/2005    History reviewed. No pertinent surgical history.      Home Medications    Prior to Admission medications   Medication Sig Start Date End Date Taking? Authorizing Provider  acetaminophen (TYLENOL) 325 MG tablet Take 2 tablets (650 mg total) by mouth every 6 (six) hours as needed.  11/12/16   Ginger Carne, MD  acetaminophen (TYLENOL) 500 MG tablet Take 1 tablet (500 mg total) by mouth every 6 (six) hours as needed. 12/13/16   Kirichenko, Tatyana, PA-C  amLODipine (NORVASC) 10 MG tablet Take 1 tablet (10 mg total) by mouth daily. 11/12/16   Ginger Carne, MD  atorvastatin (LIPITOR) 20 MG tablet Take 1 tablet (20 mg total) by mouth daily. 11/12/16   Ginger Carne, MD  diclofenac sodium (VOLTAREN) 1 % GEL Apply 4 g topically 4 (four) times daily. 02/27/17   Aviva Kluver B, PA-C  divalproex (DEPAKOTE ER) 500 MG 24 hr tablet Take 1 tablet (500 mg total) by mouth at bedtime. 11/12/16   Ginger Carne, MD  lisinopril-hydrochlorothiazide (PRINZIDE,ZESTORETIC) 20-12.5 MG tablet Take 2 tablets by mouth daily. 11/12/16 11/12/17  Ginger Carne, MD  ziprasidone (GEODON) 80 MG capsule Take 1 capsule (80 mg total) by mouth 2 (two) times daily with a meal. 11/12/16   Ginger Carne, MD    Family History Family History  Problem Relation Age of Onset  . Hyperlipidemia Mother   . Hypertension Mother   . Atrial fibrillation Mother   . Hypertension Brother   . Hypertension Maternal Grandmother   . Cancer Maternal Grandmother     Social History Social History   Tobacco Use  . Smoking status: Former Smoker    Types: Cigarettes  . Smokeless tobacco: Never Used  Substance Use Topics  .  Alcohol use: No    Alcohol/week: 0.0 oz  . Drug use: No     Allergies   Patient has no known allergies.   Review of Systems Review of Systems  All other systems reviewed and are negative.    Physical Exam Updated Vital Signs BP 138/82 (BP Location: Right Arm)   Pulse 92   Temp 99.5 F (37.5 C) (Oral)   Resp 16   Ht 5\' 10"  (1.778 m)   Wt 83.9 kg (185 lb)   SpO2 98%   BMI 26.54 kg/m   Physical Exam  Constitutional: He is oriented to person, place, and time. He appears well-developed and well-nourished.  HENT:  Head: Normocephalic and atraumatic.  No nasal congestion or  rhinorrhea  Eyes: Pupils are equal, round, and reactive to light. Conjunctivae are normal. Right eye exhibits no discharge. Left eye exhibits no discharge. No scleral icterus.  Neck: Normal range of motion. No JVD present. No tracheal deviation present.  Cardiovascular: Normal rate, regular rhythm, normal heart sounds and intact distal pulses. Exam reveals no gallop and no friction rub.  No murmur heard. Pulmonary/Chest: Effort normal and breath sounds normal. No stridor. No respiratory distress. He has no wheezes. He has no rales. He exhibits no tenderness.  Musculoskeletal: He exhibits no edema.  Neurological: He is alert and oriented to person, place, and time. Coordination normal.  Psychiatric: He has a normal mood and affect. His behavior is normal. Judgment and thought content normal.  Nursing note and vitals reviewed.    ED Treatments / Results  Labs (all labs ordered are listed, but only abnormal results are displayed) Labs Reviewed - No data to display  EKG None  Radiology No results found.  Procedures Procedures (including critical care time)  Medications Ordered in ED Medications - No data to display   Initial Impression / Assessment and Plan / ED Course  I have reviewed the triage vital signs and the nursing notes.  Pertinent labs & imaging results that were available during my care of the patient were reviewed by me and considered in my medical decision making (see chart for details).      Final Clinical Impressions(s) / ED Diagnoses   Final diagnoses:  Runny nose   Labs:   Imaging:  Consults:  Therapeutics:  Discharge Meds:   Assessment/Plan: Patient presents for evaluation with vague complaints.  Patient notes some rhinorrhea, none presently, question seasonal allergies, patient will be referred to his primary care as there is no acute findings on him today.  He is given strict return precautions, he verbalized understanding and agreement to  today's plan.      ED Discharge Orders    None       Rosalio LoudHedges, Melroy Bougher, PA-C 06/25/17 2107    Benjiman CorePickering, Nathan, MD 06/25/17 2201

## 2017-06-25 NOTE — ED Triage Notes (Signed)
Patient stated " I feel warm inside " with occasional runny nose and lightheaded this week , he suspects side effect of his medications , respirations unlabored /denies pain .

## 2017-07-08 ENCOUNTER — Encounter: Payer: Self-pay | Admitting: Internal Medicine

## 2017-07-08 ENCOUNTER — Encounter: Payer: Medicaid Other | Admitting: Internal Medicine

## 2017-07-15 ENCOUNTER — Encounter: Payer: Medicaid Other | Admitting: Internal Medicine

## 2017-07-25 ENCOUNTER — Other Ambulatory Visit: Payer: Self-pay

## 2017-07-25 ENCOUNTER — Encounter (HOSPITAL_COMMUNITY): Payer: Self-pay | Admitting: Emergency Medicine

## 2017-07-25 ENCOUNTER — Emergency Department (HOSPITAL_COMMUNITY)
Admission: EM | Admit: 2017-07-25 | Discharge: 2017-07-25 | Disposition: A | Discharge status: Home / Self Care | Payer: Medicaid Other | Attending: Emergency Medicine | Admitting: Emergency Medicine

## 2017-07-25 DIAGNOSIS — E785 Hyperlipidemia, unspecified: Secondary | ICD-10-CM | POA: Insufficient documentation

## 2017-07-25 DIAGNOSIS — Z87891 Personal history of nicotine dependence: Secondary | ICD-10-CM | POA: Insufficient documentation

## 2017-07-25 DIAGNOSIS — N183 Chronic kidney disease, stage 3 (moderate): Secondary | ICD-10-CM | POA: Insufficient documentation

## 2017-07-25 DIAGNOSIS — M1990 Unspecified osteoarthritis, unspecified site: Secondary | ICD-10-CM

## 2017-07-25 DIAGNOSIS — Z79899 Other long term (current) drug therapy: Secondary | ICD-10-CM | POA: Insufficient documentation

## 2017-07-25 DIAGNOSIS — M25532 Pain in left wrist: Secondary | ICD-10-CM | POA: Diagnosis not present

## 2017-07-25 DIAGNOSIS — E119 Type 2 diabetes mellitus without complications: Secondary | ICD-10-CM | POA: Insufficient documentation

## 2017-07-25 DIAGNOSIS — I129 Hypertensive chronic kidney disease with stage 1 through stage 4 chronic kidney disease, or unspecified chronic kidney disease: Secondary | ICD-10-CM | POA: Diagnosis not present

## 2017-07-25 DIAGNOSIS — M130 Polyarthritis, unspecified: Secondary | ICD-10-CM | POA: Insufficient documentation

## 2017-07-25 MED ORDER — DICLOFENAC SODIUM 1 % TD GEL
2.0000 g | Freq: Four times a day (QID) | TRANSDERMAL | Status: DC
  Administered 2017-07-25: 2 g via TOPICAL
  Filled 2017-07-25: qty 100

## 2017-07-25 NOTE — ED Triage Notes (Signed)
Patient to ED c/o continued L hand pain - states he has been seen for it before and told he has arthritis - saw an orthopedic doctor who gave him an ACE wrap. Patient here today because he would like something better for his hand.

## 2017-07-25 NOTE — Discharge Instructions (Signed)
You may use wrist brace, Tylenol and Voltaren gel to help with wrist pain.  You will need to follow-up with your primary care doctor and orthopedist for continued evaluation of this.  Return to the emergency department if you develop fevers, redness or swelling of the wrist, have weakness or unable to use the wrist or any other new or concerning symptoms develop.

## 2017-07-25 NOTE — ED Provider Notes (Signed)
[name] Provider Note   CSN: [id number] Arrival date & time: 07/25/17  1226     History   Chief Complaint Chief Complaint  Patient presents with  . Arthritis    HPI Kyle Montgomery is a 42 y.o. male.  Kyle Montgomery is a 42 y.o. Male with a history of hypertension, CKD, bipolar affective disorder and arthritis, who presents to the emergency department for evaluation of left hand and wrist pain.  Patient has been seen multiple times for this in the past, he denies any new injury or trauma to the hand or wrist.  Reports his arthritis is acting up.  He reports he is not currently having any pain in the wrist or hand but was yesterday.  He reports he saw an orthopedic doctor who gave him an Ace wrap but he is lost this and is requesting something more to help support his hand and wrist, he is wondering what else he can take other than Tylenol when the pain gets bad.  Patient was prescribed Voltaren gel in the past and reports this was very helpful for his pain.  He denies any redness swelling, no numbness tingling or weakness, no difficulty moving the hand or wrist.  He has not taken anything prior to arrival today, pain is worse with movement and palpation when it is hurting, no other aggravating or alleviating factors.     Past Medical History:  Diagnosis Date  . Bipolar affective (HCC)    followed by mental health  . Gynecomastia 02/07   normal pituitary by MRI  . 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 Hypertension   . Renal insufficiency    baseline Cr 2.2 - renal US with increased Echo signal, no nephrology eval, no bx    Patient Active Problem List   Diagnosis Date Noted  . Type 2 diabetes, diet controlled (HCC) 04/15/2017  . Hip pain, left 04/09/2016  . Abnormal finding of diagnostic imaging 04/09/2016  . Sprain of ankle 02/26/2016  . Hyperlipidemia 09/06/2014  . Healthcare maintenance 12/01/2012    . CKD (chronic kidney disease), stage III (HCC) 09/01/2012  . Bipolar disorder (HCC) 11/06/2005  . Essential hypertension 11/06/2005    History reviewed. No pertinent surgical history.      Home Medications    Prior to Admission medications   Medication Sig Start Date End Date Taking? Authorizing Provider  acetaminophen (TYLENOL) 325 MG tablet Take 2 tablets (650 mg total) by mouth every 6 (six) hours as needed. 11/12/16   Ginger CarneHarden, Kyler, MD  acetaminophen (TYLENOL) 500 MG tablet Take 1 tablet (500 mg total) by mouth every 6 (six) hours as needed. 12/13/16   Kirichenko, Tatyana, PA-C  amLODipine (NORVASC) 10 MG tablet Take 1 tablet (10 mg total) by mouth daily. 11/12/16   Ginger CarneHarden, Kyler, MD  atorvastatin (LIPITOR) 20 MG tablet Take 1 tablet (20 mg total) by mouth daily. 11/12/16   Ginger CarneHarden, Kyler, MD  diclofenac sodium (VOLTAREN) 1 % GEL Apply 4 g topically 4 (four) times daily. 02/27/17   Aviva KluverMurray, Alyssa B, PA-C  divalproex (DEPAKOTE ER) 500 MG 24 hr tablet Take 1 tablet (500 mg total) by mouth at bedtime. 11/12/16   Ginger CarneHarden, Kyler, MD  lisinopril-hydrochlorothiazide (PRINZIDE,ZESTORETIC) 20-12.5 MG tablet Take 2 tablets by mouth daily. 11/12/16 11/12/17  Ginger CarneHarden, Kyler, MD  ziprasidone (GEODON) 80 MG capsule Take 1 capsule (80 mg total) by mouth 2 (two) times daily with a meal. 11/12/16  Ginger Carne, MD    Family History Family History  Problem Relation Age of Onset  . Hyperlipidemia Mother   . Hypertension Mother   . Atrial fibrillation Mother   . Hypertension Brother   . Hypertension Maternal Grandmother   . Cancer Maternal Grandmother     Social History Social History   Tobacco Use  . Smoking status: Former Smoker    Types: Cigarettes  . Smokeless tobacco: Never Used  Substance Use Topics  . Alcohol use: No    Alcohol/week: 0.0 oz  . Drug use: No     Allergies   Patient has no known allergies.   Review of Systems Review of Systems  Constitutional: Negative for  chills and fever.  Musculoskeletal: Positive for arthralgias. Negative for joint swelling.  Skin: Negative for color change, rash and wound.  Neurological: Negative for weakness and numbness.     Physical Exam Updated Vital Signs BP (!) 148/98 (BP Location: Right Arm)   Pulse 77   Temp 98.8 F (37.1 C) (Oral)   Resp 16   SpO2 98%   Physical Exam  Constitutional: He appears well-developed and well-nourished. No distress.  HENT:  Head: Normocephalic and atraumatic.  Eyes: Right eye exhibits no discharge. Left eye exhibits no discharge.  Pulmonary/Chest: Effort normal. No respiratory distress.  Musculoskeletal:  Left hand and wrist nontender to palpation, no appreciable swelling, no overlying erythema or warmth, no palpable deformity, normal range of motion in the wrist and hand, 2+ radial pulse and good capillary refill, sensation intact, 5/5 grip strength and strength in the wrist.  Neurological: He is alert. Coordination normal.  Skin: Skin is warm and dry. He is not diaphoretic.  Psychiatric: He has a normal mood and affect. His behavior is normal.  Nursing note and vitals reviewed.    ED Treatments / Results  Labs (all labs ordered are listed, but only abnormal results are displayed) Labs Reviewed - No data to display  EKG None  Radiology No results found.  Procedures Procedures (including critical care time)  Medications Ordered in ED Medications  diclofenac sodium (VOLTAREN) 1 % transdermal gel 2 g (has no administration in time range)     Initial Impression / Assessment and Plan / ED Course  I have reviewed the triage vital signs and the nursing notes.  Pertinent labs & imaging results that were available during my care of the patient were reviewed by me and considered in my medical decision making (see chart for details).  Patient presents to the ED for evaluation of intermittent left wrist pain, has been seen for this in the past, diagnosed with  arthritis and has seen orthopedics as well.  No new injury or trauma to the hand and patient is not currently experiencing pain so I do not feel like imaging is necessary.  Hand is neurovascularly intact with normal range of motion, no overlying swelling erythema or warmth to suggest septic arthritis, no pain on palpation today.  Patient requesting something to help support the hand will provide patient with a Velcro wrist splint, encouraged to continue to use Tylenol, and provided with Voltaren gel here in the ED as this is helped in the past and will be safe with his history of CKD.  Patient encouraged to follow-up with orthopedics and primary care regarding this chronic issue.  Return precautions discussed.  Patient expresses understanding and is in agreement with plan.  Final Clinical Impressions(s) / ED Diagnoses   Final diagnoses:  Left wrist pain  Arthritis    ED Discharge Orders    None       Dartha Lodge, [place] 07/25/17 1321    Rolan Bucco, MD 07/25/17 [phone]

## 2017-08-07 ENCOUNTER — Other Ambulatory Visit: Payer: Self-pay

## 2017-08-07 ENCOUNTER — Encounter (HOSPITAL_COMMUNITY): Payer: Self-pay

## 2017-08-07 ENCOUNTER — Emergency Department (HOSPITAL_COMMUNITY)
Admission: EM | Admit: 2017-08-07 | Discharge: 2017-08-07 | Disposition: A | Discharge status: Home / Self Care | Payer: Medicaid Other | Attending: Emergency Medicine | Admitting: Emergency Medicine

## 2017-08-07 DIAGNOSIS — I129 Hypertensive chronic kidney disease with stage 1 through stage 4 chronic kidney disease, or unspecified chronic kidney disease: Secondary | ICD-10-CM | POA: Insufficient documentation

## 2017-08-07 DIAGNOSIS — Z87891 Personal history of nicotine dependence: Secondary | ICD-10-CM | POA: Insufficient documentation

## 2017-08-07 DIAGNOSIS — Z79899 Other long term (current) drug therapy: Secondary | ICD-10-CM | POA: Diagnosis not present

## 2017-08-07 DIAGNOSIS — E1122 Type 2 diabetes mellitus with diabetic chronic kidney disease: Secondary | ICD-10-CM | POA: Insufficient documentation

## 2017-08-07 DIAGNOSIS — R110 Nausea: Secondary | ICD-10-CM | POA: Diagnosis not present

## 2017-08-07 DIAGNOSIS — R42 Dizziness and giddiness: Secondary | ICD-10-CM | POA: Insufficient documentation

## 2017-08-07 DIAGNOSIS — N183 Chronic kidney disease, stage 3 (moderate): Secondary | ICD-10-CM | POA: Diagnosis not present

## 2017-08-07 LAB — URINALYSIS, ROUTINE W REFLEX MICROSCOPIC
Bacteria, UA: NONE SEEN
Bilirubin Urine: NEGATIVE
Glucose, UA: NEGATIVE mg/dL
Hgb urine dipstick: NEGATIVE
Ketones, ur: NEGATIVE mg/dL
Leukocytes, UA: NEGATIVE
Nitrite: NEGATIVE
Protein, ur: 30 mg/dL — AB
Specific Gravity, Urine: 1.008 (ref 1.005–1.030)
pH: 5 (ref 5.0–8.0)

## 2017-08-07 LAB — BASIC METABOLIC PANEL
Anion gap: 10 (ref 5–15)
BUN: 17 mg/dL (ref 6–20)
CO2: 27 mmol/L (ref 22–32)
Calcium: 9.6 mg/dL (ref 8.9–10.3)
Chloride: 106 mmol/L (ref 98–111)
Creatinine, Ser: 1.84 mg/dL — ABNORMAL HIGH (ref 0.61–1.24)
GFR calc Af Amer: 51 mL/min — ABNORMAL LOW (ref >60)
GFR calc non Af Amer: 44 mL/min — ABNORMAL LOW (ref >60)
Glucose, Bld: 104 mg/dL — ABNORMAL HIGH (ref 70–99)
Potassium: 3.9 mmol/L (ref 3.5–5.1)
Sodium: 143 mmol/L (ref 135–145)

## 2017-08-07 LAB — CBC
HCT: 41 % (ref 39.0–52.0)
Hemoglobin: 13.2 g/dL (ref 13.0–17.0)
MCH: 28 pg (ref 26.0–34.0)
MCHC: 32.2 g/dL (ref 30.0–36.0)
MCV: 86.9 fL (ref 78.0–100.0)
Platelets: 211 10*3/uL (ref 150–400)
RBC: 4.72 MIL/uL (ref 4.22–5.81)
RDW: 12.5 % (ref 11.5–15.5)
WBC: 6.4 10*3/uL (ref 4.0–10.5)

## 2017-08-07 MED ORDER — ONDANSETRON 4 MG PO TBDP: 4.0000 mg | ORAL_TABLET | Freq: Three times a day (TID) | ORAL | 0 refills | Status: DC | PRN

## 2017-08-07 MED ORDER — ONDANSETRON 4 MG PO TBDP
4.0000 mg | ORAL_TABLET | Freq: Once | ORAL | Status: AC
  Administered 2017-08-07: 4 mg via ORAL
  Filled 2017-08-07: qty 1

## 2017-08-07 NOTE — ED Provider Notes (Signed)
[name]   CSN: [id number] Arrival date & time: 08/07/17  1230  History   Chief Complaint Chief Complaint  Patient presents with  . Dizziness   HPI  Patient is a 42 year old male with history of bipolar disorder, HTN, and CAD presenting to the ED for nausea and lightheadedness. He states he has had waxing and waning symptoms for the last 3 days without known onset. Currently feels mildly nauseated. No vomiting or diarrhea. No chest pain, dyspnea, repetitions, or syncope. No vertiginous symptoms. Symptoms are not exertional. He has been eating and drinking well. He lives with his mother and stepfather who he states are not having similar symptoms. No history of similar symptoms previously.  Past Medical History:  Diagnosis Date  . Bipolar affective (HCC)    followed by mental health  . Gynecomastia 02/07   normal pituitary by MRI  . 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 Hypertension   . Renal insufficiency    baseline Cr 2.2 - renal US with increased Echo signal, no nephrology eval, no bx    Patient Active Problem List   Diagnosis Date Noted  . Type 2 diabetes, diet controlled (HCC) 04/15/2017  . Hip pain, left 04/09/2016  . Abnormal finding of diagnostic imaging 04/09/2016  . Sprain of ankle 02/26/2016  . Hyperlipidemia 09/06/2014  . Healthcare maintenance 12/01/2012  . CKD (chronic kidney disease), stage III (HCC) 09/01/2012  . Bipolar disorder (HCC) 11/06/2005  . Essential hypertension 11/06/2005    History reviewed. No pertinent surgical history.   Home Medications    Prior to Admission medications   Medication Sig Start Date End Date Taking? Authorizing Provider  acetaminophen (TYLENOL) 325 MG tablet Take 2 tablets (650 mg total) by mouth every 6 (six) hours as needed. 11/12/16   Ginger CarneHarden, Kyler, MD  acetaminophen (TYLENOL) 500 MG tablet Take 1 tablet (500 mg total) by mouth  every 6 (six) hours as needed. 12/13/16   Kirichenko, Tatyana, PA-C  amLODipine (NORVASC) 10 MG tablet Take 1 tablet (10 mg total) by mouth daily. 11/12/16   Ginger CarneHarden, Kyler, MD  atorvastatin (LIPITOR) 20 MG tablet Take 1 tablet (20 mg total) by mouth daily. 11/12/16   Ginger CarneHarden, Kyler, MD  diclofenac sodium (VOLTAREN) 1 % GEL Apply 4 g topically 4 (four) times daily. 02/27/17   Aviva KluverMurray, Alyssa B, PA-C  divalproex (DEPAKOTE ER) 500 MG 24 hr tablet Take 1 tablet (500 mg total) by mouth at bedtime. 11/12/16   Ginger CarneHarden, Kyler, MD  lisinopril-hydrochlorothiazide (PRINZIDE,ZESTORETIC) 20-12.5 MG tablet Take 2 tablets by mouth daily. 11/12/16 11/12/17  Ginger CarneHarden, Kyler, MD  ondansetron (ZOFRAN ODT) 4 MG disintegrating tablet Take 1 tablet (4 mg total) by mouth every 8 (eight) hours as needed for nausea or vomiting. 08/07/17   Cecille PoMacklin, Delynn Olvera W, MD  ziprasidone (GEODON) 80 MG capsule Take 1 capsule (80 mg total) by mouth 2 (two) times daily with a meal. 11/12/16   Ginger CarneHarden, Kyler, MD    Family History Family History  Problem Relation Age of Onset  . Hyperlipidemia Mother   . Hypertension Mother   . Atrial fibrillation Mother   . Hypertension Brother   . Hypertension Maternal Grandmother   . Cancer Maternal Grandmother     Social History Social History   Tobacco Use  . Smoking status: Former Smoker    Types: Cigarettes  . Smokeless tobacco: Never Used  Substance Use Topics  . Alcohol use: No  Alcohol/week: 0.0 oz  . Drug use: No     Allergies   Patient has no known allergies.   Review of Systems Review of Systems  Constitutional: Negative for fever.  HENT: Negative for congestion.   Eyes: Negative for visual disturbance.  Respiratory: Negative for cough and shortness of breath.   Cardiovascular: Negative for chest pain, palpitations and leg swelling.  Gastrointestinal: Positive for nausea. Negative for abdominal pain, diarrhea and vomiting.  Genitourinary: Negative for dysuria.    Musculoskeletal: Negative for back pain.  Skin: Negative for rash.  Neurological: Positive for light-headedness. Negative for weakness, numbness and headaches.  All other systems reviewed and are negative.    Physical Exam Updated Vital Signs BP (!) 147/98   Pulse 77   Temp 98.4 F (36.9 C) (Oral)   Resp 16   Ht 5\' 10"  (1.778 m)   Wt 79.4 kg (175 lb)   SpO2 96%   BMI 25.11 kg/m   Physical Exam  Constitutional: He is oriented to person, place, and time. No distress.  HENT:  Head: Normocephalic and atraumatic.  Mouth/Throat: Oropharynx is clear and moist.  Eyes: Pupils are equal, round, and reactive to light. EOM are normal.  No nystagmus.  Neck: Neck supple. No JVD present.  Cardiovascular: Normal rate, regular rhythm and intact distal pulses.  Murmur heard.  Diastolic (left sternal border) murmur is present with a grade of 1/6. Pulmonary/Chest: Breath sounds normal. No respiratory distress. He has no wheezes. He has no rales.  Abdominal: Soft. He exhibits no distension and no mass. There is no tenderness. There is no guarding.  Musculoskeletal: Normal range of motion. He exhibits no edema.  No calf tenderness  Neurological: He is alert and oriented to person, place, and time.  Speech is fluent. No facial asymmetry. Moves all extremities equally.   Skin: Skin is warm and dry.  Psychiatric: He has a normal mood and affect. His behavior is normal.  Nursing note and vitals reviewed.    ED Treatments / Results  Labs (all labs ordered are listed, but only abnormal results are displayed) Labs Reviewed  BASIC METABOLIC PANEL - Abnormal; Notable for the following components:      Result Value   Glucose, Bld 104 (*)    Creatinine, Ser 1.84 (*)    GFR calc non Af Amer 44 (*)    GFR calc Af Amer 51 (*)    All other components within normal limits  URINALYSIS, ROUTINE W REFLEX MICROSCOPIC - Abnormal; Notable for the following components:   Color, Urine STRAW (*)     Protein, ur 30 (*)    All other components within normal limits  CBC    EKG EKG Interpretation  Date/Time:  Thursday August 07 2017 16:34:23 EDT Ventricular Rate:  63 PR Interval:  140 QRS Duration: 96 QT Interval:  420 QTC Calculation: 429 R Axis:   0 Text Interpretation:  Normal sinus rhythm Normal ECG since last tracing no significant change Confirmed by Eber Hong ([id number]) on 08/07/2017 5:00:58 PM   Radiology No results found.  Procedures Procedures (including critical care time)  Medications Ordered in ED Medications  ondansetron (ZOFRAN-ODT) disintegrating tablet 4 mg (4 mg Oral Given 08/07/17 1616)     Initial Impression / Assessment and Plan / ED Course  I have reviewed the triage vital signs and the nursing notes.  Pertinent labs & imaging results that were available during my care of the patient were reviewed by me and considered in my  medical decision making (see chart for details).  This is a 42 year old male presenting to the ED for nausea and lightheadedness for the last 3 days as above. Unclear etiology of his mild symptoms. No exertional component or other anginal symptoms to suggest cardiac etiology such as ACS, AS, or PE. No signs of CHF. EKG is normal. No nystagmus or other s/s vertigo. Other family asymptomatic, doubt CO toxicity. Well-appearing. Screening labs reassuring with CKD at baseline. Advised PCP f/u for likely incidental faint murmur. Will discharge home.  Patient informed of all ED findings. Return precautions and follow up plan reviewed. All questions answered.   Final Clinical Impressions(s) / ED Diagnoses   Final diagnoses:  Lightheadedness  Nausea    ED Discharge Orders        Ordered    ondansetron (ZOFRAN ODT) 4 MG disintegrating tablet  Every 8 hours PRN     08/07/17 1729       Cecille Po, MD 08/07/17 Ayesha Mohair    Eber Hong, MD 08/12/17 Berton Bon    Eber Hong, MD 08/15/17 [phone]

## 2017-08-07 NOTE — ED Provider Notes (Signed)
I saw and evaluated the patient, reviewed the resident's note and I agree with the findings and plan.  Pertinent History: The patient presents with a complaint of intermittent lightheadedness, not having symptoms at this time, states it only happens indoors, no associated chest pain shortness of breath vomiting fever diarrhea or rashes or tick bites.  Pertinent Exam findings: Exam has a very early soft diastolic murmur, no other acute findings on exam, soft abdomen, clear lungs, no edema, normal strength, normal mental status, mother corroborates story and exam at baseline.  Well-appearing, no acute findings, patient stable for discharge, labs reviewed and looks similar to previous including creatinine which is better than prior.  I personally interpreted the EKG as well as the resident and agree with the interpretation on the resident's chart.  Final diagnoses:  Lightheadedness  Nausea      Eber HongMiller, Cherylin Waguespack, MD 08/12/17 2004

## 2017-08-07 NOTE — ED Notes (Signed)
Patient discharged. No questions at this time.

## 2017-08-07 NOTE — ED Triage Notes (Signed)
Pt states that for the past 3 days he has felt "lighthead like I am going to vomit". Denies any vomiting today, denies pain.

## 2017-08-11 ENCOUNTER — Encounter: Payer: Self-pay | Admitting: *Deleted

## 2017-08-16 ENCOUNTER — Other Ambulatory Visit: Payer: Self-pay

## 2017-08-16 ENCOUNTER — Emergency Department (HOSPITAL_COMMUNITY)
Admission: EM | Admit: 2017-08-16 | Discharge: 2017-08-16 | Disposition: A | Discharge status: Home / Self Care | Payer: Medicaid Other | Attending: Emergency Medicine | Admitting: Emergency Medicine

## 2017-08-16 ENCOUNTER — Encounter (HOSPITAL_COMMUNITY): Payer: Self-pay | Admitting: *Deleted

## 2017-08-16 DIAGNOSIS — E1122 Type 2 diabetes mellitus with diabetic chronic kidney disease: Secondary | ICD-10-CM | POA: Insufficient documentation

## 2017-08-16 DIAGNOSIS — N183 Chronic kidney disease, stage 3 (moderate): Secondary | ICD-10-CM | POA: Insufficient documentation

## 2017-08-16 DIAGNOSIS — I129 Hypertensive chronic kidney disease with stage 1 through stage 4 chronic kidney disease, or unspecified chronic kidney disease: Secondary | ICD-10-CM | POA: Insufficient documentation

## 2017-08-16 DIAGNOSIS — Z79899 Other long term (current) drug therapy: Secondary | ICD-10-CM | POA: Insufficient documentation

## 2017-08-16 DIAGNOSIS — Z87891 Personal history of nicotine dependence: Secondary | ICD-10-CM | POA: Diagnosis not present

## 2017-08-16 DIAGNOSIS — Z760 Encounter for issue of repeat prescription: Secondary | ICD-10-CM

## 2017-08-16 DIAGNOSIS — F319 Bipolar disorder, unspecified: Secondary | ICD-10-CM | POA: Diagnosis present

## 2017-08-16 LAB — CBC WITH DIFFERENTIAL/PLATELET
Abs Immature Granulocytes: 0 10*3/uL (ref 0.0–0.1)
Basophils Absolute: 0.1 10*3/uL (ref 0.0–0.1)
Basophils Relative: 1 %
EOS PCT: 3 %
Eosinophils Absolute: 0.2 10*3/uL (ref 0.0–0.7)
HEMATOCRIT: 42.4 % (ref 39.0–52.0)
Hemoglobin: 13.7 g/dL (ref 13.0–17.0)
IMMATURE GRANULOCYTES: 0 %
LYMPHS ABS: 2.9 10*3/uL (ref 0.7–4.0)
Lymphocytes Relative: 41 %
MCH: 28 pg (ref 26.0–34.0)
MCHC: 32.3 g/dL (ref 30.0–36.0)
MCV: 86.5 fL (ref 78.0–100.0)
MONO ABS: 0.5 10*3/uL (ref 0.1–1.0)
MONOS PCT: 7 %
NEUTROS PCT: 48 %
Neutro Abs: 3.3 10*3/uL (ref 1.7–7.7)
Platelets: 228 10*3/uL (ref 150–400)
RBC: 4.9 MIL/uL (ref 4.22–5.81)
RDW: 12.7 % (ref 11.5–15.5)
WBC: 7 10*3/uL (ref 4.0–10.5)

## 2017-08-16 LAB — COMPREHENSIVE METABOLIC PANEL
ALK PHOS: 54 U/L (ref 38–126)
ALT: 28 U/L (ref 0–44)
AST: 31 U/L (ref 15–41)
Albumin: 3.8 g/dL (ref 3.5–5.0)
Anion gap: 8 (ref 5–15)
BUN: 24 mg/dL — AB (ref 6–20)
CHLORIDE: 107 mmol/L (ref 98–111)
CO2: 26 mmol/L (ref 22–32)
CREATININE: 1.84 mg/dL — AB (ref 0.61–1.24)
Calcium: 9.3 mg/dL (ref 8.9–10.3)
GFR calc Af Amer: 51 mL/min — ABNORMAL LOW (ref >60)
GFR calc non Af Amer: 44 mL/min — ABNORMAL LOW (ref >60)
GLUCOSE: 95 mg/dL (ref 70–99)
Potassium: 3.5 mmol/L (ref 3.5–5.1)
SODIUM: 141 mmol/L (ref 135–145)
Total Bilirubin: 0.5 mg/dL (ref 0.3–1.2)
Total Protein: 7.2 g/dL (ref 6.5–8.1)

## 2017-08-16 NOTE — ED Provider Notes (Signed)
[name] EMERGENCY DEPARTMENT Provider Note   CSN: [id number] Arrival date & time: 08/16/17  1345     History   Chief Complaint Chief Complaint  Patient presents with  . Nausea    HPI JERAULD BOSTWICK is a 42 y.o. male who presents to the emergency department asking for medication for "sickness." has a past medical history of Bipolar affective , Gynecomastia , Hyperprolactinemia. Patient is a poor historian. History is limited by the mental capacity of the patient, patient's ability to communicate effectively, and overall poor insight.  H has a hx of mental illness but I also have very high suspicion for significant cognitive impairment.  Patient states that he came to the emergency department to get some medicine because he had "sickness."  He is unable to specify what his sickness is.  He denies nausea vomiting abdominal pain.  Eventually he does state that he was taking some Robitussin for what he thought was a cold.  He states that his symptoms are gone and he would like to leave at this time.  It is very difficult to gather any other information from this patient as he is unable to articulate late cohesive thoughts.    HPI  Past Medical History:  Diagnosis Date  . Bipolar affective (HCC)    followed by mental health  . Gynecomastia 02/07   normal pituitary by MRI  . 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 Hypertension   . Renal insufficiency    baseline Cr 2.2 - renal US with increased Echo signal, no nephrology eval, no bx    Patient Active Problem List   Diagnosis Date Noted  . Type 2 diabetes, diet controlled (HCC) 04/15/2017  . Hip pain, left 04/09/2016  . Abnormal finding of diagnostic imaging 04/09/2016  . Sprain of ankle 02/26/2016  . Hyperlipidemia 09/06/2014  . Healthcare maintenance 12/01/2012  . CKD (chronic kidney disease), stage III (HCC) 09/01/2012  . Bipolar disorder (HCC) 11/06/2005  . Essential  hypertension 11/06/2005    History reviewed. No pertinent surgical history.      Home Medications    Prior to Admission medications   Medication Sig Start Date End Date Taking? Authorizing Provider  acetaminophen (TYLENOL) 325 MG tablet Take 2 tablets (650 mg total) by mouth every 6 (six) hours as needed. 11/12/16   Ginger Carne, MD  acetaminophen (TYLENOL) 500 MG tablet Take 1 tablet (500 mg total) by mouth every 6 (six) hours as needed. 12/13/16   Kirichenko, Tatyana, PA-C  amLODipine (NORVASC) 10 MG tablet Take 1 tablet (10 mg total) by mouth daily. 11/12/16   Ginger Carne, MD  atorvastatin (LIPITOR) 20 MG tablet Take 1 tablet (20 mg total) by mouth daily. 11/12/16   Ginger Carne, MD  diclofenac sodium (VOLTAREN) 1 % GEL Apply 4 g topically 4 (four) times daily. 02/27/17   Aviva Kluver B, PA-C  divalproex (DEPAKOTE ER) 500 MG 24 hr tablet Take 1 tablet (500 mg total) by mouth at bedtime. 11/12/16   Ginger Carne, MD  lisinopril-hydrochlorothiazide (PRINZIDE,ZESTORETIC) 20-12.5 MG tablet Take 2 tablets by mouth daily. 11/12/16 11/12/17  Ginger Carne, MD  ondansetron (ZOFRAN ODT) 4 MG disintegrating tablet Take 1 tablet (4 mg total) by mouth every 8 (eight) hours as needed for nausea or vomiting. 08/07/17   Cecille Po, MD  ziprasidone (GEODON) 80 MG capsule Take 1 capsule (80 mg total) by mouth 2 (two) times daily with a meal. 11/12/16  Ginger CarneHarden, Kyler, MD    Family History Family History  Problem Relation Age of Onset  . Hyperlipidemia Mother   . Hypertension Mother   . Atrial fibrillation Mother   . Hypertension Brother   . Hypertension Maternal Grandmother   . Cancer Maternal Grandmother     Social History Social History   Tobacco Use  . Smoking status: Former Smoker    Types: Cigarettes  . Smokeless tobacco: Never Used  Substance Use Topics  . Alcohol use: No    Alcohol/week: 0.0 oz  . Drug use: No     Allergies   Patient has no known  allergies.   Review of Systems Review of Systems  Unable to perform ROS: Psychiatric disorder     Physical Exam Updated Vital Signs BP (!) 135/95 (BP Location: Right Arm)   Pulse 88   Temp 97.8 F (36.6 C) (Oral)   Resp 18   Ht 5\' 10"  (1.778 m)   Wt 79.4 kg (175 lb)   SpO2 98%   BMI 25.11 kg/m   Physical Exam  Constitutional: He is oriented to person, place, and time. He appears well-developed and well-nourished. No distress.  HENT:  Head: Normocephalic and atraumatic.  Eyes: Conjunctivae are normal. No scleral icterus.  Neck: Normal range of motion. Neck supple.  Cardiovascular: Normal rate, regular rhythm and normal heart sounds.  Pulmonary/Chest: Effort normal and breath sounds normal. No respiratory distress.  Abdominal: Soft. There is no tenderness.  Musculoskeletal: He exhibits no edema.  Neurological: He is alert and oriented to person, place, and time.  Skin: Skin is warm and dry. He is not diaphoretic.  Psychiatric: His behavior is normal.  Nursing note and vitals reviewed.    ED Treatments / Results  Labs (all labs ordered are listed, but only abnormal results are displayed) Labs Reviewed  COMPREHENSIVE METABOLIC PANEL - Abnormal; Notable for the following components:      Result Value   BUN 24 (*)    Creatinine, Ser 1.84 (*)    GFR calc non Af Amer 44 (*)    GFR calc Af Amer 51 (*)    All other components within normal limits  CBC WITH DIFFERENTIAL/PLATELET    EKG None  Radiology No results found.  Procedures Procedures (including critical care time)  Medications Ordered in ED Medications - No data to display   Initial Impression / Assessment and Plan / ED Course  I have reviewed the triage vital signs and the nursing notes.  Pertinent labs & imaging results that were available during my care of the patient were reviewed by me and considered in my medical decision making (see chart for details).     Patient's labs are at baseline.   He states he is asymptomatic at this time.  He says that he will follow-up with his primary care doctor on Monday.  I do not think that the patient has any emergent issues at this time he is well-appearing with what looks like poorly controlled outpatient hypertension that may be followed up with outpatient services.  He is denying any chest pain shortness of breath is afebrile hemodynamically stable and appears appropriate for discharge at this time  Final Clinical Impressions(s) / ED Diagnoses   Final diagnoses:  None    ED Discharge Orders    None       Arthor CaptainHarris, Maryl Blalock, PA-C 08/16/17 2024    Gwyneth SproutPlunkett, Whitney, MD 08/17/17 1956

## 2017-08-16 NOTE — ED Triage Notes (Signed)
PT reports since the other med. Helped him feel better  ,he wants some other kind of meds  help him. Pt still can not name what problems he has now

## 2017-08-16 NOTE — ED Triage Notes (Signed)
Pt reports he is not dizzy but just feels sick. Pt can not be specific . Pt denies V/V/D. Pt denies ABD pain . Pt reports all the problems he had on last visit are better now.

## 2017-08-23 ENCOUNTER — Encounter (HOSPITAL_COMMUNITY): Payer: Self-pay | Admitting: Emergency Medicine

## 2017-08-23 ENCOUNTER — Other Ambulatory Visit: Payer: Self-pay

## 2017-08-23 ENCOUNTER — Emergency Department (HOSPITAL_COMMUNITY)
Admission: EM | Admit: 2017-08-23 | Discharge: 2017-08-23 | Disposition: A | Discharge status: Home / Self Care | Payer: Medicaid Other | Attending: Emergency Medicine | Admitting: Emergency Medicine

## 2017-08-23 DIAGNOSIS — E119 Type 2 diabetes mellitus without complications: Secondary | ICD-10-CM | POA: Insufficient documentation

## 2017-08-23 DIAGNOSIS — Z79899 Other long term (current) drug therapy: Secondary | ICD-10-CM | POA: Insufficient documentation

## 2017-08-23 DIAGNOSIS — I10 Essential (primary) hypertension: Secondary | ICD-10-CM | POA: Insufficient documentation

## 2017-08-23 DIAGNOSIS — E876 Hypokalemia: Secondary | ICD-10-CM | POA: Diagnosis not present

## 2017-08-23 DIAGNOSIS — R110 Nausea: Secondary | ICD-10-CM | POA: Diagnosis not present

## 2017-08-23 DIAGNOSIS — Z87891 Personal history of nicotine dependence: Secondary | ICD-10-CM | POA: Diagnosis not present

## 2017-08-23 LAB — CBC WITH DIFFERENTIAL/PLATELET
ABS IMMATURE GRANULOCYTES: 0 10*3/uL (ref 0.0–0.1)
Basophils Absolute: 0.1 10*3/uL (ref 0.0–0.1)
Basophils Relative: 1 %
EOS ABS: 0.3 10*3/uL (ref 0.0–0.7)
Eosinophils Relative: 4 %
HEMATOCRIT: 43.6 % (ref 39.0–52.0)
Hemoglobin: 14.3 g/dL (ref 13.0–17.0)
IMMATURE GRANULOCYTES: 0 %
LYMPHS ABS: 2.9 10*3/uL (ref 0.7–4.0)
Lymphocytes Relative: 43 %
MCH: 28.3 pg (ref 26.0–34.0)
MCHC: 32.8 g/dL (ref 30.0–36.0)
MCV: 86.3 fL (ref 78.0–100.0)
Monocytes Absolute: 0.4 10*3/uL (ref 0.1–1.0)
Monocytes Relative: 6 %
NEUTROS ABS: 3 10*3/uL (ref 1.7–7.7)
NEUTROS PCT: 46 %
Platelets: 247 10*3/uL (ref 150–400)
RBC: 5.05 MIL/uL (ref 4.22–5.81)
RDW: 12.7 % (ref 11.5–15.5)
WBC: 6.6 10*3/uL (ref 4.0–10.5)

## 2017-08-23 LAB — COMPREHENSIVE METABOLIC PANEL
ALBUMIN: 3.8 g/dL (ref 3.5–5.0)
ALT: 23 U/L (ref 0–44)
AST: 31 U/L (ref 15–41)
Alkaline Phosphatase: 54 U/L (ref 38–126)
Anion gap: 10 (ref 5–15)
BILIRUBIN TOTAL: 0.6 mg/dL (ref 0.3–1.2)
BUN: 21 mg/dL — AB (ref 6–20)
CO2: 26 mmol/L (ref 22–32)
Calcium: 9.2 mg/dL (ref 8.9–10.3)
Chloride: 108 mmol/L (ref 98–111)
Creatinine, Ser: 1.85 mg/dL — ABNORMAL HIGH (ref 0.61–1.24)
GFR calc Af Amer: 50 mL/min — ABNORMAL LOW (ref >60)
GFR calc non Af Amer: 43 mL/min — ABNORMAL LOW (ref >60)
GLUCOSE: 144 mg/dL — AB (ref 70–99)
POTASSIUM: 2.8 mmol/L — AB (ref 3.5–5.1)
Sodium: 144 mmol/L (ref 135–145)
TOTAL PROTEIN: 7 g/dL (ref 6.5–8.1)

## 2017-08-23 LAB — VALPROIC ACID LEVEL: Valproic Acid Lvl: <10 ug/mL — ABNORMAL LOW (ref 50.0–100.0)

## 2017-08-23 MED ORDER — POTASSIUM CHLORIDE CRYS ER 20 MEQ PO TBCR
40.0000 meq | EXTENDED_RELEASE_TABLET | Freq: Once | ORAL | Status: AC
  Administered 2017-08-23: 40 meq via ORAL
  Filled 2017-08-23: qty 2

## 2017-08-23 MED ORDER — ONDANSETRON HCL 4 MG PO TABS: 4.0000 mg | ORAL_TABLET | Freq: Four times a day (QID) | ORAL | 0 refills | Status: DC

## 2017-08-23 MED ORDER — POTASSIUM CHLORIDE ER 10 MEQ PO TBCR: 10.0000 meq | EXTENDED_RELEASE_TABLET | Freq: Every day | ORAL | 0 refills | Status: DC

## 2017-08-23 MED ORDER — ONDANSETRON 4 MG PO TBDP
4.0000 mg | ORAL_TABLET | Freq: Once | ORAL | Status: AC
  Administered 2017-08-23: 4 mg via ORAL
  Filled 2017-08-23: qty 1

## 2017-08-23 NOTE — Discharge Instructions (Signed)
Take the medications as needed for nausea and your potassium, follow-up with your primary care doctor this week

## 2017-08-23 NOTE — ED Triage Notes (Addendum)
Pt to ER for evaluation of nausea and no vomiting, pt is ambulatory in NAD, states was told if his nausea "gets worser he needed to come to the emergency room and stay over night." Pt is very poor historian. Pt denies pain. Denies episodes of vomiting.

## 2017-08-23 NOTE — ED Notes (Signed)
Called main lab to inquire about lab results previously drawn; states they just received blood and were accepting it now

## 2017-08-23 NOTE — ED Provider Notes (Signed)
[name] EMERGENCY DEPARTMENT Provider Note   CSN: [id number] Arrival date & time: 08/23/17  1403   Level 5 caveat: Diminished mental capacity History   Chief Complaint Chief Complaint  Patient presents with  . Nausea    HPI Kyle Montgomery is a 42 y.o. male.  HPI Patient presents to the emergency room for evaluation of nausea without vomiting.  Patient states he has been having these episodes for the last few weeks.  The episodes will come and go.  Patient states it lasts maybe 20 minutes at a time.  He does not have any room spinning he does get lightheaded.  He does not have any trouble with his balance or coordination.  No issues with his appetite.  No trouble with headache or chest pain.  No trouble with shortness of breath or abdominal pain.  No focal numbness or weakness.  Patient has been seen in the emergency room a couple of times for this.  Patient states the last time it happened the nurse told him if he felt like this again he should come back to the emergency room.  Patient does have a primary care doctor and has not seen them since this started. Past Medical History:  Diagnosis Date  . Bipolar affective (HCC)    followed by mental health  . Gynecomastia 02/07   normal pituitary by MRI  . 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 Hypertension   . Renal insufficiency    baseline Cr 2.2 - renal US with increased Echo signal, no nephrology eval, no bx    Patient Active Problem List   Diagnosis Date Noted  . Type 2 diabetes, diet controlled (HCC) 04/15/2017  . Hip pain, left 04/09/2016  . Abnormal finding of diagnostic imaging 04/09/2016  . Sprain of ankle 02/26/2016  . Hyperlipidemia 09/06/2014  . Healthcare maintenance 12/01/2012  . CKD (chronic kidney disease), stage III (HCC) 09/01/2012  . Bipolar disorder (HCC) 11/06/2005  . Essential hypertension 11/06/2005    History reviewed. No pertinent surgical  history.      Home Medications    Prior to Admission medications   Medication Sig Start Date End Date Taking? Authorizing Provider  acetaminophen (TYLENOL) 325 MG tablet Take 2 tablets (650 mg total) by mouth every 6 (six) hours as needed. 11/12/16   Ginger Carne, MD  acetaminophen (TYLENOL) 500 MG tablet Take 1 tablet (500 mg total) by mouth every 6 (six) hours as needed. 12/13/16   Kirichenko, Tatyana, PA-C  amLODipine (NORVASC) 10 MG tablet Take 1 tablet (10 mg total) by mouth daily. 11/12/16   Ginger Carne, MD  atorvastatin (LIPITOR) 20 MG tablet Take 1 tablet (20 mg total) by mouth daily. 11/12/16   Ginger Carne, MD  diclofenac sodium (VOLTAREN) 1 % GEL Apply 4 g topically 4 (four) times daily. 02/27/17   Aviva Kluver B, PA-C  divalproex (DEPAKOTE ER) 500 MG 24 hr tablet Take 1 tablet (500 mg total) by mouth at bedtime. 11/12/16   Ginger Carne, MD  lisinopril-hydrochlorothiazide (PRINZIDE,ZESTORETIC) 20-12.5 MG tablet Take 2 tablets by mouth daily. 11/12/16 11/12/17  Ginger Carne, MD  ondansetron (ZOFRAN ODT) 4 MG disintegrating tablet Take 1 tablet (4 mg total) by mouth every 8 (eight) hours as needed for nausea or vomiting. 08/07/17   Cecille Po, MD  ondansetron (ZOFRAN) 4 MG tablet Take 1 tablet (4 mg total) by mouth every 6 (six) hours. 08/23/17   Linwood Dibbles, MD  potassium  chloride (K-DUR) 10 MEQ tablet Take 1 tablet (10 mEq total) by mouth daily. 08/23/17   Linwood DibblesKnapp, Wanona Stare, MD  ziprasidone (GEODON) 80 MG capsule Take 1 capsule (80 mg total) by mouth 2 (two) times daily with a meal. 11/12/16   Ginger CarneHarden, Kyler, MD    Family History Family History  Problem Relation Age of Onset  . Hyperlipidemia Mother   . Hypertension Mother   . Atrial fibrillation Mother   . Hypertension Brother   . Hypertension Maternal Grandmother   . Cancer Maternal Grandmother     Social History Social History   Tobacco Use  . Smoking status: Former Smoker    Types: Cigarettes  . Smokeless  tobacco: Never Used  Substance Use Topics  . Alcohol use: No    Alcohol/week: 0.0 oz  . Drug use: No     Allergies   Patient has no known allergies.   Review of Systems Review of Systems  All other systems reviewed and are negative.    Physical Exam Updated Vital Signs BP 122/86 (BP Location: Right Arm)   Pulse 82   Temp 98.3 F (36.8 C) (Oral)   Resp 16   SpO2 98%   Physical Exam  Constitutional: He appears well-developed and well-nourished. No distress.  HENT:  Head: Normocephalic and atraumatic.  Right Ear: External ear normal.  Left Ear: External ear normal.  Eyes: Conjunctivae are normal. Right eye exhibits no discharge. Left eye exhibits no discharge. No scleral icterus.  Neck: Neck supple. No tracheal deviation present.  Cardiovascular: Normal rate, regular rhythm and intact distal pulses.  Pulmonary/Chest: Effort normal and breath sounds normal. No stridor. No respiratory distress. He has no wheezes. He has no rales.  Abdominal: Soft. Bowel sounds are normal. He exhibits no distension. There is no tenderness. There is no rebound and no guarding.  Musculoskeletal: He exhibits no edema or tenderness.  Neurological: He is alert. He has normal strength. No cranial nerve deficit (no facial droop, extraocular movements intact, no slurred speech) or sensory deficit. He exhibits normal muscle tone. He displays no seizure activity. Coordination normal.  Skin: Skin is warm and dry. No rash noted.  Psychiatric: He has a normal mood and affect.  Nursing note and vitals reviewed.    ED Treatments / Results  Labs (all labs ordered are listed, but only abnormal results are displayed) Labs Reviewed  COMPREHENSIVE METABOLIC PANEL - Abnormal; Notable for the following components:      Result Value   Potassium 2.8 (*)    Glucose, Bld 144 (*)    BUN 21 (*)    Creatinine, Ser 1.85 (*)    GFR calc non Af Amer 43 (*)    GFR calc Af Amer 50 (*)    All other components within  normal limits  VALPROIC ACID LEVEL - Abnormal; Notable for the following components:   Valproic Acid Lvl <10 (*)    All other components within normal limits  CBC WITH DIFFERENTIAL/PLATELET    EKG EKG Interpretation  Date/Time:  Saturday August 23 2017 16:53:51 EDT Ventricular Rate:  72 PR Interval:  148 QRS Duration: 98 QT Interval:  416 QTC Calculation: 455 R Axis:   -24 Text Interpretation:  Normal sinus rhythm Left ventricular hypertrophy Abnormal ECG No significant change since last tracing Confirmed by Linwood DibblesKnapp, Dalaney Needle [phone]([id number]) on 08/23/2017 4:59:53 PM   Radiology No results found.  Procedures Procedures (including critical care time)  Medications Ordered in ED Medications  potassium chloride SA (K-DUR,KLOR-CON) CR tablet  40 mEq (has no administration in time range)  ondansetron (ZOFRAN-ODT) disintegrating tablet 4 mg (4 mg Oral Given 08/23/17 1816)     Initial Impression / Assessment and Plan / ED Course  I have reviewed the triage vital signs and the nursing notes.  Pertinent labs & imaging results that were available during my care of the patient were reviewed by me and considered in my medical decision making (see chart for details).  Clinical Course as of Aug 24 2007  Sat Aug 23, 2017  2008 Labs notable for decreased potassium.   [JK]  2008 Patient has elevated creatinine but unchanged from baseline   [JK]    Clinical Course User Index [JK] Linwood Dibbles, MD    Patient presented to the emergency room for recurrent nausea.  Patient has not not had any vomiting.  He denies any chest pain.  He has not had any issues eating or drinking.  Somewhat difficult to determine what is causing this patient's episodes of nausea.  He could be having some mild gastritis.  I will have him take some antinausea medications.  Recommend follow-up with his primary care doctor.  I will also have him take potassium supplementation. Final Clinical Impressions(s) / ED Diagnoses   Final  diagnoses:  Hypokalemia  Nausea    ED Discharge Orders        Ordered    ondansetron (ZOFRAN) 4 MG tablet  Every 6 hours     08/23/17 2007    potassium chloride (K-DUR) 10 MEQ tablet  Daily     08/23/17 2007       Linwood Dibbles, MD 08/23/17 2009

## 2017-08-23 NOTE — ED Notes (Addendum)
ED Provider at bedside. EDP reviewed d/c instructions on phone with mother, mother confirmed she is picking pt up from ER. Signature pad unavailable, pt verbalized d/c instructions.

## 2017-08-24 ENCOUNTER — Encounter (HOSPITAL_COMMUNITY): Payer: Self-pay | Admitting: Emergency Medicine

## 2017-08-24 ENCOUNTER — Other Ambulatory Visit: Payer: Self-pay

## 2017-08-24 ENCOUNTER — Emergency Department (HOSPITAL_COMMUNITY): Payer: Medicaid Other

## 2017-08-24 ENCOUNTER — Emergency Department (HOSPITAL_COMMUNITY)
Admission: EM | Admit: 2017-08-24 | Discharge: 2017-08-24 | Disposition: A | Discharge status: Home / Self Care | Payer: Medicaid Other | Attending: Emergency Medicine | Admitting: Emergency Medicine

## 2017-08-24 DIAGNOSIS — N183 Chronic kidney disease, stage 3 (moderate): Secondary | ICD-10-CM | POA: Diagnosis not present

## 2017-08-24 DIAGNOSIS — K5900 Constipation, unspecified: Secondary | ICD-10-CM | POA: Diagnosis not present

## 2017-08-24 DIAGNOSIS — Z87891 Personal history of nicotine dependence: Secondary | ICD-10-CM | POA: Insufficient documentation

## 2017-08-24 DIAGNOSIS — E1122 Type 2 diabetes mellitus with diabetic chronic kidney disease: Secondary | ICD-10-CM | POA: Insufficient documentation

## 2017-08-24 DIAGNOSIS — R110 Nausea: Secondary | ICD-10-CM | POA: Diagnosis present

## 2017-08-24 DIAGNOSIS — I129 Hypertensive chronic kidney disease with stage 1 through stage 4 chronic kidney disease, or unspecified chronic kidney disease: Secondary | ICD-10-CM | POA: Insufficient documentation

## 2017-08-24 DIAGNOSIS — R42 Dizziness and giddiness: Secondary | ICD-10-CM | POA: Diagnosis not present

## 2017-08-24 MED ORDER — PANTOPRAZOLE SODIUM 20 MG PO TBEC: 20.0000 mg | DELAYED_RELEASE_TABLET | Freq: Every day | ORAL | 0 refills | Status: DC

## 2017-08-24 MED ORDER — SUCRALFATE 1 GM/10ML PO SUSP: 1.0000 g | Freq: Three times a day (TID) | ORAL | 0 refills | Status: DC

## 2017-08-24 MED ORDER — POLYETHYLENE GLYCOL 3350 17 G PO PACK: 17.0000 g | PACK | Freq: Every day | ORAL | 0 refills | Status: DC

## 2017-08-24 NOTE — ED Provider Notes (Signed)
Emergency Department Provider Note   I have reviewed the triage vital signs and the nursing notes.   HISTORY  Chief Complaint Nausea and Dizziness   HPI Kyle Montgomery is a 42 y.o. male with PMH of Bipolar affective, HTN, CKD, and DM presents to the emergency department for evaluation of intermittent nausea followed by some lightheadedness.  The patient has had symptoms ongoing for the past several weeks.  Denies any pain in his chest or abdomen.  He continues to eat and drink normally.  No improvement or worsening with food intake.  Denies any shortness of breath, palpitations, or vertigo symptoms. No radiation of symptoms or modifying factors.  Patient was seen in the emergency department yesterday and discharged home with Zofran and potassium supplementation.  He has been taking his medications reports some relief but states "I still don't know what's going on."   Past Medical History:  Diagnosis Date  . Bipolar affective (HCC)    followed by mental health  . Gynecomastia 02/07   normal pituitary by MRI  . 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 Hypertension   . Renal insufficiency    baseline Cr 2.2 - renal US with increased Echo signal, no nephrology eval, no bx    Patient Active Problem List   Diagnosis Date Noted  . Type 2 diabetes, diet controlled (HCC) 04/15/2017  . Hip pain, left 04/09/2016  . Abnormal finding of diagnostic imaging 04/09/2016  . Sprain of ankle 02/26/2016  . Hyperlipidemia 09/06/2014  . Healthcare maintenance 12/01/2012  . CKD (chronic kidney disease), stage III (HCC) 09/01/2012  . Bipolar disorder (HCC) 11/06/2005  . Essential hypertension 11/06/2005    History reviewed. No pertinent surgical history.  Allergies Patient has no known allergies.  Family History  Problem Relation Age of Onset  . Hyperlipidemia Mother   . Hypertension Mother   . Atrial fibrillation Mother   . Hypertension Brother   .  Hypertension Maternal Grandmother   . Cancer Maternal Grandmother     Social History Social History   Tobacco Use  . Smoking status: Former Smoker    Types: Cigarettes  . Smokeless tobacco: Never Used  Substance Use Topics  . Alcohol use: No    Alcohol/week: 0.0 oz  . Drug use: No    Review of Systems  Constitutional: No fever/chills. Positive lightheadedness.  Eyes: No visual changes. ENT: No sore throat. Cardiovascular: Denies chest pain. Respiratory: Denies shortness of breath. Gastrointestinal: No abdominal pain. Positive nausea, no vomiting.  No diarrhea.  No constipation. Genitourinary: Negative for dysuria. Musculoskeletal: Negative for back pain. Skin: Negative for rash. Neurological: Negative for headaches, focal weakness or numbness.  10-point ROS otherwise negative.  ____________________________________________   PHYSICAL EXAM:  VITAL SIGNS: ED Triage Vitals  Enc Vitals Group     BP 08/24/17 2051 (!) 139/102     Pulse Rate 08/24/17 2051 77     Resp 08/24/17 2051 15     Temp 08/24/17 2051 98.5 F (36.9 C)     Temp Source 08/24/17 2051 Oral     SpO2 08/24/17 2051 97 %     Weight 08/24/17 2051 175 lb (79.4 kg)     Height 08/24/17 2051 5\' 10"  (1.778 m)     Pain Score 08/24/17 2111 0   Constitutional: Alert and oriented. Well appearing and in no acute distress. Eyes: Conjunctivae are normal.  Head: Atraumatic. Nose: No congestion/rhinnorhea. Mouth/Throat: Mucous membranes are moist.  Neck: No  stridor.  Cardiovascular: Normal rate, regular rhythm. Good peripheral circulation. Grossly normal heart sounds.   Respiratory: Normal respiratory effort.  No retractions. Lungs CTAB. Gastrointestinal: Soft and nontender. No distention.  Musculoskeletal: No lower extremity tenderness nor edema. No gross deformities of extremities. Neurologic:  Normal speech and language. No gross focal neurologic deficits are appreciated.  Skin:  Skin is warm, dry and intact.  No rash noted. ____________________________________________  EKG   EKG Interpretation  Date/Time:  Sunday August 24 2017 22:15:05 EDT Ventricular Rate:  68 PR Interval:    QRS Duration: 103 QT Interval:  428 QTC Calculation: 456 R Axis:   -11 Text Interpretation:  Sinus rhythm Low voltage, precordial leads Left ventricular hypertrophy Nonspecific T abnormalities, inferior leads Similar to prior. No STEMI.  Confirmed by Alona Bene [phone]) on 08/24/2017 10:19:27 PM Also confirmed by Alona Bene [phone]), editor Elita Quick ([id number])  on 08/25/2017 7:42:56 AM       ____________________________________________  RADIOLOGY  Dg Abdomen Acute W/chest  Result Date: 08/24/2017 CLINICAL DATA:  Nausea and lightheadedness for 1 week, history hypertension, renal insufficiency, former smoker EXAM: DG ABDOMEN ACUTE W/ 1V CHEST COMPARISON:  None FINDINGS: Upper normal heart size. Mediastinal contours and pulmonary vascularity normal. Lungs clear. No pleural effusion or pneumothorax. Scattered gas and mildly prominent stool throughout colon. Nonobstructive bowel gas pattern. No bowel dilatation, bowel wall thickening, or free air. Osseous structures unremarkable. No urinary tract calcifications. IMPRESSION: Mildly prominent stool in colon. Otherwise negative exam. Electronically Signed   By: [name] M.D.   On: 08/24/2017 22:57    ____________________________________________   PROCEDURES  Procedure(s) performed:   Procedures  None ____________________________________________   INITIAL IMPRESSION / ASSESSMENT AND PLAN / ED COURSE  Pertinent labs & imaging results that were available during my care of the patient were reviewed by me and considered in my medical decision making (see chart for details).  Patient presents to the emergency department for evaluation of persistent intermittent nausea and lightheadedness.  He was evaluated yesterday with lab work which was remarkable only  for mild hypokalemia.  His symptoms improved with Zofran at home but not completely and he is still concerned that he does not know what is going on.  No pain symptoms.  I do not feel that repeat lab work is necessary only 24 hours since his last blood draw.  EKG reviewed with no acute changes.  Plan for plain film of the abdomen and pelvis.  I advised that he will need close follow-up with his primary care physician as well as possible referral to gastroenterology.  He may have some mild or developing gastroparesis.  Doubt ACS.  No neuro deficits.  No vertigo symptoms.  Considering adding Protonix and Carafate and continue Zofran which seems to be working reasonably well so far.   At this time, I do not feel there is any life-threatening condition present. I have reviewed and discussed all results (EKG, imaging, lab, urine as appropriate), exam findings with patient. I have reviewed nursing notes and appropriate previous records.  I feel the patient is safe to be discharged home without further emergent workup. Discussed usual and customary return precautions. Patient and family (if present) verbalize understanding and are comfortable with this plan.  Patient will follow-up with their primary care provider. If they do not have a primary care provider, information for follow-up has been provided to them. All questions have been answered.  ____________________________________________  FINAL CLINICAL IMPRESSION(S) / ED DIAGNOSES  Final diagnoses:  Nausea  Episodic lightheadedness  Constipation, unspecified constipation type     NEW OUTPATIENT MEDICATIONS STARTED DURING THIS VISIT:  Discharge Medication List as of 08/24/2017 11:10 PM    START taking these medications   Details  pantoprazole (PROTONIX) 20 MG tablet Take 1 tablet (20 mg total) by mouth daily., Starting Sun 08/24/2017, Until Tue 09/23/2017, Print    polyethylene glycol (MIRALAX) packet Take 17 g by mouth daily., Starting Sun 08/24/2017,  Print    sucralfate (CARAFATE) 1 GM/10ML suspension Take 10 mLs (1 g total) by mouth 4 (four) times daily -  with meals and at bedtime for 14 days., Starting Sun 08/24/2017, Until Sun 09/07/2017, Print        Note:  This document was prepared using Dragon voice recognition software and may include unintentional dictation errors.  Alona Bene, MD Emergency Medicine    Long, Arlyss Repress, MD 08/25/17 [phone]

## 2017-08-24 NOTE — Discharge Instructions (Signed)
You have been seen in the Emergency Department (ED) today for nausea.  Your work up today has not shown a clear cause for your symptoms. °You have been prescribed Zofran; please use as prescribed as needed for your nausea. ° °Follow up with your doctor as soon as possible regarding today?s emergent visit and your symptoms of nausea.  ° °Return to the ED if you develop abdominal, bloody vomiting, bloody diarrhea, if you are unable to tolerate fluids due to vomiting, or if you develop other symptoms that concern you. ° °

## 2017-08-24 NOTE — ED Triage Notes (Signed)
Patient states for a week he has been nauseated and light headed on and off.

## 2017-08-28 ENCOUNTER — Emergency Department (HOSPITAL_COMMUNITY)
Admission: EM | Admit: 2017-08-28 | Discharge: 2017-08-28 | Disposition: A | Discharge status: Home / Self Care | Payer: Medicaid Other | Attending: Emergency Medicine | Admitting: Emergency Medicine

## 2017-08-28 ENCOUNTER — Other Ambulatory Visit: Payer: Self-pay

## 2017-08-28 DIAGNOSIS — F319 Bipolar disorder, unspecified: Secondary | ICD-10-CM | POA: Diagnosis not present

## 2017-08-28 DIAGNOSIS — N183 Chronic kidney disease, stage 3 (moderate): Secondary | ICD-10-CM | POA: Insufficient documentation

## 2017-08-28 DIAGNOSIS — I129 Hypertensive chronic kidney disease with stage 1 through stage 4 chronic kidney disease, or unspecified chronic kidney disease: Secondary | ICD-10-CM | POA: Diagnosis not present

## 2017-08-28 DIAGNOSIS — E1122 Type 2 diabetes mellitus with diabetic chronic kidney disease: Secondary | ICD-10-CM | POA: Insufficient documentation

## 2017-08-28 DIAGNOSIS — K5900 Constipation, unspecified: Secondary | ICD-10-CM | POA: Diagnosis not present

## 2017-08-28 MED ORDER — DOCUSATE SODIUM 100 MG PO CAPS: 100.0000 mg | ORAL_CAPSULE | Freq: Two times a day (BID) | ORAL | 0 refills | Status: DC | PRN

## 2017-08-28 NOTE — ED Provider Notes (Signed)
[facility]-EMERGENCY DEPT Provider Note   CSN: [id number] Arrival date & time: 08/28/17  1104     History   Chief Complaint Chief Complaint  Patient presents with  . Constipation    HPI Kyle Montgomery is a 42 y.o. male.  HPI Patient presents with 4 days of constipation.  Denies abdominal pain.  No nausea or vomiting.  No fever or chills.  States he has been taking MiraLAX once daily. Past Medical History:  Diagnosis Date  . Bipolar affective (HCC)    followed by mental health  . Gynecomastia 02/07   normal pituitary by MRI  . 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 Hypertension   . Renal insufficiency    baseline Cr 2.2 - renal US with increased Echo signal, no nephrology eval, no bx    Patient Active Problem List   Diagnosis Date Noted  . Type 2 diabetes, diet controlled (HCC) 04/15/2017  . Hip pain, left 04/09/2016  . Abnormal finding of diagnostic imaging 04/09/2016  . Sprain of ankle 02/26/2016  . Hyperlipidemia 09/06/2014  . Healthcare maintenance 12/01/2012  . CKD (chronic kidney disease), stage III (HCC) 09/01/2012  . Bipolar disorder (HCC) 11/06/2005  . Essential hypertension 11/06/2005    No past surgical history on file.      Home Medications    Prior to Admission medications   Medication Sig Start Date End Date Taking? Authorizing Provider  acetaminophen (TYLENOL) 500 MG tablet Take 1 tablet (500 mg total) by mouth every 6 (six) hours as needed. Patient taking differently: Take 500 mg by mouth every 6 (six) hours as needed for mild pain, moderate pain or headache.  12/13/16  Yes Kirichenko, Tatyana, PA-C  amLODipine (NORVASC) 10 MG tablet Take 1 tablet (10 mg total) by mouth daily. 11/12/16  Yes Ginger Carne, MD  diclofenac sodium (VOLTAREN) 1 % GEL Apply 4 g topically 4 (four) times daily. Patient taking differently: Apply 4 g topically 4 (four) times daily as needed (pain).  02/27/17  Yes  Dayton Scrape, Alyssa B, PA-C  lisinopril-hydrochlorothiazide (PRINZIDE,ZESTORETIC) 20-12.5 MG tablet Take 2 tablets by mouth daily. 11/12/16 11/12/17 Yes Ginger Carne, MD  pantoprazole (PROTONIX) 20 MG tablet Take 1 tablet (20 mg total) by mouth daily. 08/24/17 09/23/17 Yes Long, Arlyss Repress, MD  polyethylene glycol [facility]) packet Take 17 g by mouth daily. 08/24/17  Yes Long, Arlyss Repress, MD  potassium chloride (K-DUR) 10 MEQ tablet Take 1 tablet (10 mEq total) by mouth daily. 08/23/17  Yes Linwood Dibbles, MD  docusate sodium (COLACE) 100 MG capsule Take 1 capsule (100 mg total) by mouth 2 (two) times daily as needed for mild constipation. 08/28/17   Loren Racer, MD  ondansetron (ZOFRAN) 4 MG tablet Take 1 tablet (4 mg total) by mouth every 6 (six) hours. Patient not taking: Reported on 08/28/2017 08/23/17   Linwood Dibbles, MD  sucralfate (CARAFATE) 1 GM/10ML suspension Take 10 mLs (1 g total) by mouth 4 (four) times daily -  with meals and at bedtime for 14 days. Patient not taking: Reported on 08/28/2017 08/24/17 09/07/17  Long, Arlyss Repress, MD    Family History Family History  Problem Relation Age of Onset  . Hyperlipidemia Mother   . Hypertension Mother   . Atrial fibrillation Mother   . Hypertension Brother   . Hypertension Maternal Grandmother   . Cancer Maternal Grandmother     Social History Social History   Tobacco Use  . Smoking status:  Former Smoker    Types: Cigarettes  . Smokeless tobacco: Never Used  Substance Use Topics  . Alcohol use: No    Alcohol/week: 0.0 standard drinks  . Drug use: No     Allergies   Patient has no known allergies.   Review of Systems Review of Systems  Constitutional: Negative for chills and fever.  Respiratory: Negative for cough and shortness of breath.   Cardiovascular: Negative for chest pain.  Gastrointestinal: Positive for constipation. Negative for abdominal pain, blood in stool, diarrhea, nausea and vomiting.  Genitourinary: Negative for difficulty  urinating, flank pain, frequency and hematuria.  Musculoskeletal: Negative for back pain, myalgias, neck pain and neck stiffness.  Skin: Negative for rash and wound.  Neurological: Negative for dizziness, weakness, light-headedness, numbness and headaches.  All other systems reviewed and are negative.    Physical Exam Updated Vital Signs BP 132/88   Pulse 65   Temp 97.8 F (36.6 C) (Oral)   Resp 16   Ht 5\' 10"  (1.778 m)   Wt 79.4 kg   SpO2 100%   BMI 25.11 kg/m   Physical Exam  Constitutional: He is oriented to person, place, and time. He appears well-developed and well-nourished. No distress.  HENT:  Head: Normocephalic and atraumatic.  Mouth/Throat: Oropharynx is clear and moist. No oropharyngeal exudate.  Eyes: Pupils are equal, round, and reactive to light. EOM are normal.  Neck: Normal range of motion. Neck supple.  Cardiovascular: Normal rate and regular rhythm. Exam reveals no gallop and no friction rub.  No murmur heard. Pulmonary/Chest: Effort normal and breath sounds normal. No stridor. No respiratory distress. He has no wheezes. He has no rales. He exhibits no tenderness.  Abdominal: Soft. Bowel sounds are normal. There is no tenderness. There is no rebound and no guarding.  Musculoskeletal: Normal range of motion. He exhibits no edema or tenderness.  No CVA tenderness.  No midline thoracic or lumbar tenderness.  No lower extremity swelling, asymmetry or tenderness.  Neurological: He is alert and oriented to person, place, and time.  Moving all extremities without focal deficit.  Sensation fully intact.  Skin: Skin is warm and dry. No rash noted. He is not diaphoretic. No erythema.  Psychiatric: He has a normal mood and affect. His behavior is normal.  Nursing note and vitals reviewed.    ED Treatments / Results  Labs (all labs ordered are listed, but only abnormal results are displayed) Labs Reviewed - No data to display  EKG None  Radiology No results  found.  Procedures Procedures (including critical care time)  Medications Ordered in ED Medications - No data to display   Initial Impression / Assessment and Plan / ED Course  I have reviewed the triage vital signs and the nursing notes.  Pertinent labs & imaging results that were available during my care of the patient were reviewed by me and considered in my medical decision making (see chart for details).    Abdominal exam is benign. We will give stool softener.  Advised to use MiraLAX up to 3 times daily until successful bowel movement.  Return precautions given.  Final Clinical Impressions(s) / ED Diagnoses   Final diagnoses:  Constipation, unspecified constipation type    ED Discharge Orders         Ordered    docusate sodium (COLACE) 100 MG capsule  2 times daily PRN     08/28/17 1341           Loren RacerYelverton, Hadas Jessop, MD 08/28/17  1341  

## 2017-08-28 NOTE — Discharge Instructions (Addendum)
You may take MiraLAX up to 3 times daily until you have a bowel movement.  Take stool softener in addition.  Return for abdominal distention, abdominal pain, persistent vomiting, fever or for any concerns.

## 2017-08-28 NOTE — ED Triage Notes (Signed)
Pt arrives via POV from home. Pt reports that he has had constipation for 4 days. Last BM was 4-5 days ago. PT reports taking MOM and ex lax with no relief.

## 2017-09-03 ENCOUNTER — Encounter (HOSPITAL_COMMUNITY): Payer: Self-pay | Admitting: *Deleted

## 2017-09-03 ENCOUNTER — Other Ambulatory Visit: Payer: Self-pay

## 2017-09-03 ENCOUNTER — Emergency Department (HOSPITAL_COMMUNITY)
Admission: EM | Admit: 2017-09-03 | Discharge: 2017-09-03 | Disposition: A | Discharge status: Home / Self Care | Payer: Medicaid Other | Attending: Emergency Medicine | Admitting: Emergency Medicine

## 2017-09-03 DIAGNOSIS — B9789 Other viral agents as the cause of diseases classified elsewhere: Secondary | ICD-10-CM | POA: Insufficient documentation

## 2017-09-03 DIAGNOSIS — E1122 Type 2 diabetes mellitus with diabetic chronic kidney disease: Secondary | ICD-10-CM | POA: Diagnosis not present

## 2017-09-03 DIAGNOSIS — J069 Acute upper respiratory infection, unspecified: Secondary | ICD-10-CM | POA: Diagnosis not present

## 2017-09-03 DIAGNOSIS — Z79899 Other long term (current) drug therapy: Secondary | ICD-10-CM | POA: Insufficient documentation

## 2017-09-03 DIAGNOSIS — I129 Hypertensive chronic kidney disease with stage 1 through stage 4 chronic kidney disease, or unspecified chronic kidney disease: Secondary | ICD-10-CM | POA: Diagnosis not present

## 2017-09-03 DIAGNOSIS — N183 Chronic kidney disease, stage 3 (moderate): Secondary | ICD-10-CM | POA: Insufficient documentation

## 2017-09-03 DIAGNOSIS — Z87891 Personal history of nicotine dependence: Secondary | ICD-10-CM | POA: Insufficient documentation

## 2017-09-03 DIAGNOSIS — R05 Cough: Secondary | ICD-10-CM | POA: Diagnosis present

## 2017-09-03 MED ORDER — BENZONATATE 100 MG PO CAPS
100.0000 mg | ORAL_CAPSULE | Freq: Once | ORAL | Status: AC
  Administered 2017-09-03: 100 mg via ORAL
  Filled 2017-09-03: qty 1

## 2017-09-03 MED ORDER — BENZONATATE 100 MG PO CAPS: 100.0000 mg | ORAL_CAPSULE | Freq: Three times a day (TID) | ORAL | 0 refills | Status: DC

## 2017-09-03 NOTE — ED Provider Notes (Signed)
[name] Provider Note   CSN: [id number] Arrival date & time: 09/03/17  1111     History   Chief Complaint Chief Complaint  Patient presents with  . Cough  . Nasal Congestion    HPI Kyle Montgomery is a 42 y.o. male.  HPI   42 year old male presents today with nasal congestion and cough. Patient has 3 day history of upper respiratory congestion, dry nonproductive cough. Denies any fever, shortness of breath or any other complications. Patient reports he is a previous smoker but no longer smoking.  No other complaints are today.  Past Medical History:  Diagnosis Date  . Bipolar affective (HCC)    followed by mental health  . Gynecomastia 02/07   normal pituitary by MRI  . 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 Hypertension   . Renal insufficiency    baseline Cr 2.2 - renal US with increased Echo signal, no nephrology eval, no bx    Patient Active Problem List   Diagnosis Date Noted  . Type 2 diabetes, diet controlled (HCC) 04/15/2017  . Hip pain, left 04/09/2016  . Abnormal finding of diagnostic imaging 04/09/2016  . Sprain of ankle 02/26/2016  . Hyperlipidemia 09/06/2014  . Healthcare maintenance 12/01/2012  . CKD (chronic kidney disease), stage III (HCC) 09/01/2012  . Bipolar disorder (HCC) 11/06/2005  . Essential hypertension 11/06/2005    History reviewed. No pertinent surgical history.      Home Medications    Prior to Admission medications   Medication Sig Start Date End Date Taking? Authorizing Provider  acetaminophen (TYLENOL) 500 MG tablet Take 1 tablet (500 mg total) by mouth every 6 (six) hours as needed. Patient taking differently: Take 500 mg by mouth every 6 (six) hours as needed for mild pain, moderate pain or headache.  12/13/16   Kirichenko, Tatyana, PA-C  amLODipine (NORVASC) 10 MG tablet Take 1 tablet (10 mg total) by mouth daily. 11/12/16   Ginger CarneHarden, Kyler, MD    benzonatate (TESSALON) 100 MG capsule Take 1 capsule (100 mg total) by mouth every 8 (eight) hours. 09/03/17   Latravious Levitt, Tinnie GensJeffrey, PA-C  diclofenac sodium (VOLTAREN) 1 % GEL Apply 4 g topically 4 (four) times daily. Patient taking differently: Apply 4 g topically 4 (four) times daily as needed (pain).  02/27/17   Aviva KluverMurray, Alyssa B, PA-C  docusate sodium (COLACE) 100 MG capsule Take 1 capsule (100 mg total) by mouth 2 (two) times daily as needed for mild constipation. 08/28/17   Loren RacerYelverton, David, MD  lisinopril-hydrochlorothiazide (PRINZIDE,ZESTORETIC) 20-12.5 MG tablet Take 2 tablets by mouth daily. 11/12/16 11/12/17  Ginger CarneHarden, Kyler, MD  ondansetron (ZOFRAN) 4 MG tablet Take 1 tablet (4 mg total) by mouth every 6 (six) hours. Patient not taking: Reported on 08/28/2017 08/23/17   Linwood DibblesKnapp, Jon, MD  pantoprazole (PROTONIX) 20 MG tablet Take 1 tablet (20 mg total) by mouth daily. 08/24/17 09/23/17  Long, Arlyss RepressJoshua G, MD  polyethylene glycol [facility](MIRALAX) packet Take 17 g by mouth daily. 08/24/17   Long, Arlyss RepressJoshua G, MD  potassium chloride (K-DUR) 10 MEQ tablet Take 1 tablet (10 mEq total) by mouth daily. 08/23/17   Linwood DibblesKnapp, Jon, MD  sucralfate (CARAFATE) 1 GM/10ML suspension Take 10 mLs (1 g total) by mouth 4 (four) times daily -  with meals and at bedtime for 14 days. Patient not taking: Reported on 08/28/2017 08/24/17 09/07/17  Long, Arlyss RepressJoshua G, MD    Family History Family History  Problem Relation  Age of Onset  . Hyperlipidemia Mother   . Hypertension Mother   . Atrial fibrillation Mother   . Hypertension Brother   . Hypertension Maternal Grandmother   . Cancer Maternal Grandmother     Social History Social History   Tobacco Use  . Smoking status: Former Smoker    Types: Cigarettes  . Smokeless tobacco: Never Used  Substance Use Topics  . Alcohol use: No    Alcohol/week: 0.0 standard drinks  . Drug use: No     Allergies   Patient has no known allergies.   Review of Systems Review of Systems  All other systems  reviewed and are negative.    Physical Exam Updated Vital Signs BP (!) 132/97 (BP Location: Right Arm)   Pulse 85   Temp 97.8 F (36.6 C) (Oral)   Resp 16   Ht 5\' 10"  (1.778 m)   Wt 79.4 kg   SpO2 97%   BMI 25.11 kg/m   Physical Exam  Constitutional: He is oriented to person, place, and time. He appears well-developed and well-nourished.  HENT:  Head: Normocephalic and atraumatic.  Eyes: Pupils are equal, round, and reactive to light. Conjunctivae are normal. Right eye exhibits no discharge. Left eye exhibits no discharge. No scleral icterus.  Neck: Normal range of motion. No JVD present. No tracheal deviation present.  Cardiovascular: Normal rate, regular rhythm and normal heart sounds.  Pulmonary/Chest: Effort normal and breath sounds normal. No stridor. No respiratory distress. He has no wheezes. He has no rales. He exhibits no tenderness.  Neurological: He is alert and oriented to person, place, and time. Coordination normal.  Psychiatric: He has a normal mood and affect. His behavior is normal. Judgment and thought content normal.  Nursing note and vitals reviewed.    ED Treatments / Results  Labs (all labs ordered are listed, but only abnormal results are displayed) Labs Reviewed - No data to display  EKG None  Radiology No results found.  Procedures Procedures (including critical care time)  Medications Ordered in ED Medications  benzonatate (TESSALON) capsule 100 mg (has no administration in time range)     Initial Impression / Assessment and Plan / ED Course  I have reviewed the triage vital signs and the nursing notes.  Pertinent labs & imaging results that were available during my care of the patient were reviewed by me and considered in my medical decision making (see chart for details).      Assessment/Plan: 42 year old male presents today with complaints of upper respiratory congestion and cough. Likely viral in nature well appearing in no  acute distress, clear lung sounds. Low suspicion for pneumonia or bacterial infection, patient discharged with symptomatic care instructions and precautions. He verbalized understanding and agreement to today's plan.  Final Clinical Impressions(s) / ED Diagnoses   Final diagnoses:  Viral URI with cough    ED Discharge Orders         Ordered    benzonatate (TESSALON) 100 MG capsule  Every 8 hours     09/03/17 1311           Eyvonne MechanicHedges, Kery Haltiwanger, PA-C 09/03/17 1312    Long, Arlyss RepressJoshua G, MD 09/04/17 [phone]

## 2017-09-03 NOTE — ED Triage Notes (Signed)
Pt c/o non productive cough and runny nose  X 2-3 days ago, denies SOB, pt afebrile, A&O x4

## 2017-09-03 NOTE — Discharge Instructions (Addendum)
Please read attached information. If you experience any new or worsening signs or symptoms please return to the emergency room for evaluation. Please follow-up with your primary care provider or specialist as discussed. Please use medication prescribed only as directed and discontinue taking if you have any concerning signs or symptoms.   °

## 2017-09-12 ENCOUNTER — Encounter (HOSPITAL_COMMUNITY): Payer: Self-pay

## 2017-09-12 ENCOUNTER — Emergency Department (HOSPITAL_COMMUNITY)
Admission: EM | Admit: 2017-09-12 | Discharge: 2017-09-12 | Disposition: A | Discharge status: Home / Self Care | Payer: Medicaid Other | Attending: Emergency Medicine | Admitting: Emergency Medicine

## 2017-09-12 DIAGNOSIS — N183 Chronic kidney disease, stage 3 (moderate): Secondary | ICD-10-CM | POA: Insufficient documentation

## 2017-09-12 DIAGNOSIS — E1122 Type 2 diabetes mellitus with diabetic chronic kidney disease: Secondary | ICD-10-CM | POA: Diagnosis not present

## 2017-09-12 DIAGNOSIS — I129 Hypertensive chronic kidney disease with stage 1 through stage 4 chronic kidney disease, or unspecified chronic kidney disease: Secondary | ICD-10-CM | POA: Insufficient documentation

## 2017-09-12 DIAGNOSIS — R110 Nausea: Secondary | ICD-10-CM | POA: Diagnosis not present

## 2017-09-12 DIAGNOSIS — Z79899 Other long term (current) drug therapy: Secondary | ICD-10-CM | POA: Diagnosis not present

## 2017-09-12 DIAGNOSIS — Z87891 Personal history of nicotine dependence: Secondary | ICD-10-CM | POA: Insufficient documentation

## 2017-09-12 LAB — COMPREHENSIVE METABOLIC PANEL
ALK PHOS: 55 U/L (ref 38–126)
ALT: 27 U/L (ref 0–44)
ANION GAP: 11 (ref 5–15)
AST: 35 U/L (ref 15–41)
Albumin: 3.8 g/dL (ref 3.5–5.0)
BILIRUBIN TOTAL: 0.6 mg/dL (ref 0.3–1.2)
BUN: 21 mg/dL — ABNORMAL HIGH (ref 6–20)
CO2: 27 mmol/L (ref 22–32)
CREATININE: 1.78 mg/dL — AB (ref 0.61–1.24)
Calcium: 9.3 mg/dL (ref 8.9–10.3)
Chloride: 103 mmol/L (ref 98–111)
GFR calc non Af Amer: 45 mL/min — ABNORMAL LOW (ref >60)
GFR, EST AFRICAN AMERICAN: 53 mL/min — AB (ref >60)
Glucose, Bld: 129 mg/dL — ABNORMAL HIGH (ref 70–99)
Potassium: 3.3 mmol/L — ABNORMAL LOW (ref 3.5–5.1)
SODIUM: 141 mmol/L (ref 135–145)
TOTAL PROTEIN: 7.1 g/dL (ref 6.5–8.1)

## 2017-09-12 LAB — CBC
HCT: 42.7 % (ref 39.0–52.0)
Hemoglobin: 14 g/dL (ref 13.0–17.0)
MCH: 28.1 pg (ref 26.0–34.0)
MCHC: 32.8 g/dL (ref 30.0–36.0)
MCV: 85.7 fL (ref 78.0–100.0)
PLATELETS: 238 10*3/uL (ref 150–400)
RBC: 4.98 MIL/uL (ref 4.22–5.81)
RDW: 12.3 % (ref 11.5–15.5)
WBC: 7.2 10*3/uL (ref 4.0–10.5)

## 2017-09-12 LAB — LIPASE, BLOOD: Lipase: 95 U/L — ABNORMAL HIGH (ref 11–51)

## 2017-09-12 MED ORDER — SUCRALFATE 1 GM/10ML PO SUSP: 1.0000 g | Freq: Three times a day (TID) | ORAL | 0 refills | Status: DC

## 2017-09-12 NOTE — ED Provider Notes (Signed)
[name] EMERGENCY DEPARTMENT Provider Note   CSN: [id number] Arrival date & time: 09/12/17  1224     History   Chief Complaint No chief complaint on file.   HPI Kyle Montgomery is a 42 y.o. male.  42 year old male presents with complaint of ongoing nausea.  Patient was seen in the emergency room previously and given Zofran ODT, states he needed a stronger medication as he has been taking this but continues to feel nauseous for several weeks.  Patient denies chest pain, abdominal pain, changes in bowel or bladder habits, fevers, chills.  No other complaints or concerns.     Past Medical History:  Diagnosis Date  . Bipolar affective (HCC)    followed by mental health  . Gynecomastia 02/07   normal pituitary by MRI  . 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 Hypertension   . Renal insufficiency    baseline Cr 2.2 - renal US with increased Echo signal, no nephrology eval, no bx    Patient Active Problem List   Diagnosis Date Noted  . Type 2 diabetes, diet controlled (HCC) 04/15/2017  . Hip pain, left 04/09/2016  . Abnormal finding of diagnostic imaging 04/09/2016  . Sprain of ankle 02/26/2016  . Hyperlipidemia 09/06/2014  . Healthcare maintenance 12/01/2012  . CKD (chronic kidney disease), stage III (HCC) 09/01/2012  . Bipolar disorder (HCC) 11/06/2005  . Essential hypertension 11/06/2005    History reviewed. No pertinent surgical history.      Home Medications    Prior to Admission medications   Medication Sig Start Date End Date Taking? Authorizing Provider  acetaminophen (TYLENOL) 500 MG tablet Take 1 tablet (500 mg total) by mouth every 6 (six) hours as needed. Patient taking differently: Take 500 mg by mouth every 6 (six) hours as needed for mild pain, moderate pain or headache.  12/13/16   Kirichenko, Tatyana, PA-C  amLODipine (NORVASC) 10 MG tablet Take 1 tablet (10 mg total) by mouth daily. 11/12/16    Ginger Carne, MD  benzonatate (TESSALON) 100 MG capsule Take 1 capsule (100 mg total) by mouth every 8 (eight) hours. 09/03/17   Hedges, Tinnie Gens, PA-C  diclofenac sodium (VOLTAREN) 1 % GEL Apply 4 g topically 4 (four) times daily. Patient taking differently: Apply 4 g topically 4 (four) times daily as needed (pain).  02/27/17   Aviva Kluver B, PA-C  docusate sodium (COLACE) 100 MG capsule Take 1 capsule (100 mg total) by mouth 2 (two) times daily as needed for mild constipation. 08/28/17   Loren Racer, MD  lisinopril-hydrochlorothiazide (PRINZIDE,ZESTORETIC) 20-12.5 MG tablet Take 2 tablets by mouth daily. 11/12/16 11/12/17  Ginger Carne, MD  pantoprazole (PROTONIX) 20 MG tablet Take 1 tablet (20 mg total) by mouth daily. 08/24/17 09/23/17  Long, Arlyss Repress, MD  polyethylene glycol [facility]) packet Take 17 g by mouth daily. 08/24/17   Long, Arlyss Repress, MD  potassium chloride (K-DUR) 10 MEQ tablet Take 1 tablet (10 mEq total) by mouth daily. 08/23/17   Linwood Dibbles, MD  sucralfate (CARAFATE) 1 GM/10ML suspension Take 10 mLs (1 g total) by mouth 4 (four) times daily -  with meals and at bedtime for 14 days. 09/12/17 09/26/17  Jeannie Fend, PA-C    Family History Family History  Problem Relation Age of Onset  . Hyperlipidemia Mother   . Hypertension Mother   . Atrial fibrillation Mother   . Hypertension Brother   . Hypertension Maternal Grandmother   .  Cancer Maternal Grandmother     Social History Social History   Tobacco Use  . Smoking status: Former Smoker    Types: Cigarettes  . Smokeless tobacco: Never Used  Substance Use Topics  . Alcohol use: No    Alcohol/week: 0.0 standard drinks  . Drug use: No     Allergies   Patient has no known allergies.   Review of Systems Review of Systems  Constitutional: Negative for chills and fever.  Respiratory: Negative for shortness of breath.   Cardiovascular: Negative for chest pain.  Gastrointestinal: Positive for nausea. Negative for  abdominal pain, constipation, diarrhea and vomiting.  Genitourinary: Negative for dysuria.  Skin: Negative for rash and wound.  Neurological: Negative for dizziness and weakness.  Psychiatric/Behavioral: Negative for confusion.  All other systems reviewed and are negative.    Physical Exam Updated Vital Signs BP 118/90 (BP Location: Right Arm)   Pulse 88   Temp 97.9 F (36.6 C) (Oral)   Resp 18   SpO2 96%   Physical Exam  Constitutional: He is oriented to person, place, and time. He appears well-developed and well-nourished. No distress.  HENT:  Head: Normocephalic and atraumatic.  Eyes: Conjunctivae are normal.  Cardiovascular: Normal rate, regular rhythm, normal heart sounds and intact distal pulses.  No murmur heard. Pulmonary/Chest: Effort normal and breath sounds normal. No respiratory distress.  Abdominal: Soft. He exhibits no distension. There is no tenderness.  Neurological: He is alert and oriented to person, place, and time.  Skin: Skin is warm and dry. He is not diaphoretic.  Psychiatric: He has a normal mood and affect. His behavior is normal.  Nursing note and vitals reviewed.    ED Treatments / Results  Labs (all labs ordered are listed, but only abnormal results are displayed) Labs Reviewed  LIPASE, BLOOD - Abnormal; Notable for the following components:      Result Value   Lipase 95 (*)    All other components within normal limits  COMPREHENSIVE METABOLIC PANEL - Abnormal; Notable for the following components:   Potassium 3.3 (*)    Glucose, Bld 129 (*)    BUN 21 (*)    Creatinine, Ser 1.78 (*)    GFR calc non Af Amer 45 (*)    GFR calc Af Amer 53 (*)    All other components within normal limits  CBC    EKG None  Radiology No results found.  Procedures Procedures (including critical care time)  Medications Ordered in ED Medications - No data to display   Initial Impression / Assessment and Plan / ED Course  I have reviewed the triage  vital signs and the nursing notes.  Pertinent labs & imaging results that were available during my care of the patient were reviewed by me and considered in my medical decision making (see chart for details).  Clinical Course as of Sep 13 1355  Fri Sep 12, 2017  1354 42yo male presents with nausea, denies vomiting, abdominal pain/CP/SHOB. Patient has been taking Zofran ODT and states he needs a stronger medicine now but unable to say why as he is not vomiting. On exam, abdomen is soft and non tender, patient is well appearing. CMP with mild hypokalemia, renal function at baseline for this patient. Lipase elevated at 95, CBC WNL. Refilled patient's carafate and referral to PCP for further management.    [LM]    Clinical Course User Index [LM] Jeannie Fend, PA-C    Final Clinical Impressions(s) / ED Diagnoses  Final diagnoses:  Nausea    ED Discharge Orders         Ordered    sucralfate (CARAFATE) 1 GM/10ML suspension  3 times daily with meals & bedtime     09/12/17 1348           Jeannie FendMurphy, Shemaiah Round A, PA-C 09/12/17 1358    Charlynne PanderYao, David Hsienta, MD 09/14/17 2119

## 2017-09-12 NOTE — Discharge Instructions (Addendum)
Take Carafate as prescribed. Follow up with PCP.

## 2017-09-12 NOTE — ED Triage Notes (Signed)
Patient complains of ongoing nausea for 3 weeks. Has been seen recently for light headedness but denies that during assessment. No abd, pain, bo diarrhea. NAD

## 2017-09-20 ENCOUNTER — Other Ambulatory Visit: Payer: Self-pay

## 2017-09-20 ENCOUNTER — Encounter (HOSPITAL_COMMUNITY): Payer: Self-pay | Admitting: Emergency Medicine

## 2017-09-20 ENCOUNTER — Emergency Department (HOSPITAL_COMMUNITY): Payer: Medicaid Other

## 2017-09-20 ENCOUNTER — Emergency Department (HOSPITAL_COMMUNITY)
Admission: EM | Admit: 2017-09-20 | Discharge: 2017-09-20 | Disposition: A | Discharge status: Home / Self Care | Payer: Medicaid Other | Attending: Emergency Medicine | Admitting: Emergency Medicine

## 2017-09-20 DIAGNOSIS — I129 Hypertensive chronic kidney disease with stage 1 through stage 4 chronic kidney disease, or unspecified chronic kidney disease: Secondary | ICD-10-CM | POA: Diagnosis not present

## 2017-09-20 DIAGNOSIS — Y929 Unspecified place or not applicable: Secondary | ICD-10-CM | POA: Diagnosis not present

## 2017-09-20 DIAGNOSIS — Z87891 Personal history of nicotine dependence: Secondary | ICD-10-CM | POA: Diagnosis not present

## 2017-09-20 DIAGNOSIS — I10 Essential (primary) hypertension: Secondary | ICD-10-CM

## 2017-09-20 DIAGNOSIS — S93401A Sprain of unspecified ligament of right ankle, initial encounter: Secondary | ICD-10-CM | POA: Diagnosis not present

## 2017-09-20 DIAGNOSIS — N183 Chronic kidney disease, stage 3 (moderate): Secondary | ICD-10-CM | POA: Diagnosis not present

## 2017-09-20 DIAGNOSIS — Y999 Unspecified external cause status: Secondary | ICD-10-CM | POA: Insufficient documentation

## 2017-09-20 DIAGNOSIS — E1122 Type 2 diabetes mellitus with diabetic chronic kidney disease: Secondary | ICD-10-CM | POA: Insufficient documentation

## 2017-09-20 DIAGNOSIS — Y9301 Activity, walking, marching and hiking: Secondary | ICD-10-CM | POA: Insufficient documentation

## 2017-09-20 DIAGNOSIS — W228XXA Striking against or struck by other objects, initial encounter: Secondary | ICD-10-CM | POA: Diagnosis not present

## 2017-09-20 DIAGNOSIS — Z79899 Other long term (current) drug therapy: Secondary | ICD-10-CM | POA: Diagnosis not present

## 2017-09-20 DIAGNOSIS — S99911A Unspecified injury of right ankle, initial encounter: Secondary | ICD-10-CM | POA: Diagnosis present

## 2017-09-20 NOTE — ED Notes (Signed)
Patient able to ambulate independently  

## 2017-09-20 NOTE — ED Triage Notes (Signed)
Pt reports he tripped 2 days ago and hit his right foot on the back of his left foot, reports pain and swelling, taking tylenol with some relief but is unable to bare a lot of weight.

## 2017-09-20 NOTE — Discharge Instructions (Addendum)
Please return to the Emergency Department for any new or worsening symptoms or if your symptoms do not improve. Please be sure to follow up with your Primary Care Physician as soon as possible regarding your visit today. If you do not have a Primary Doctor please use the resources below to establish one. Please use rest ice compression and elevation to help with your pain. You may continue to use Tylenol as directed on the packaging for your pain. Please follow-up with the orthopedic doctor on your discharge paperwork for further evaluation of your ankle pain. Your blood pressure was elevated today, please be sure to follow-up with your primary care provider regarding this as well. Please take your blood pressure medication as prescribed by your primary doctor.  Contact a health care provider if: You have rapidly increasing bruising or swelling. Your pain is not relieved with medicine. Get help right away if: Your toes or foot becomes numb or blue. You have severe pain that gets worse.t Contact a doctor if: You think you are having a reaction to the medicine you are taking. You have headaches that keep coming back (recurring). You feel dizzy. You have swelling in your ankles. You have trouble with your vision. Get help right away if: You get a very bad headache. You start to feel confused. You feel weak or numb. You feel faint. You get very bad pain in your: Chest. Belly (abdomen). You throw up (vomit) more than once. You have trouble breathing.  RESOURCE GUIDE  Chronic Pain Problems: Contact [facility]  [phone] Patients need to be referred by their primary care doctor.  Insufficient Money for Medicine: Contact United Way:  call "211" or [facility] [phone].  No Primary Care Doctor: Call Health Connect  [phone] - can help you locate a primary care doctor that  accepts your insurance, provides certain services, etc. Physician Referral Service-  [phone]  Agencies that provide inexpensive medical care: [facility]  [phone] [facility]  [phone] [facility]  [phone] [facility]  [phone] Planned Parenthood  [phone] [facility]  [phone]  Medicaid-accepting [place] Providers: [facility]- [address], [address]  [phone], Mon-Fri 9am-7pm, Sat 9am-1pm [facility]- [address], [address] [place]  [phone] [facility]- [address], [address] [place]  [phone] [facility]- [address]  [phone] Renaye RakersVeita Bland- [address], [address], [phone]  Only accepts [place] patients after they have their name  applied to their card  Self Pay (no insurance) in [facility]: Sickle Cell Patients: Dr Willey BladeEric Dean, [facility]  [address], [phone] [facility]- [address]  [phone]       Redge Gainer-     [facility]- [address], [address]       -     [facility]- see information above (Speak to Cit[employer]Pam H if you do not have insurance)       -  [facility]- [address], [phone]       -  [facility]- [address],  [phone]       -  [facility]- [address], [phone]       -  Dr Julio Sickssei-Bonsu-  [address], [address], [place],  [phone]       [facility]- [address] [phone]       -  [facility]- [address], [phone], also [address], [phone]       -    [facility]- [address] [place], [phone], 1st & 3rd Saturday   every month, 10am-1pm  1) Find a Doctor and Pay Out of Pocket Although you won't have to find out who is covered by your insurance plan, it is a good idea to ask around and get recommendations. You will then need to call the office and see if the doctor you  have chosen will accept you as a new patient and what types of options they offer for patients who are self-pay. Some doctors offer discounts or will set up payment plans for their patients who do not have insurance, but you will need to ask so you aren't surprised when you get to your appointment.  2) Contact Your Local Health Department Not all health departments have doctors that can see patients for sick visits, but many do, so it is worth a call to see if yours does. If you don't know where your local health department is, you can check in your phone book. The CDC also has a tool to help you locate your state's health department, and many state websites also have listings of all of their local health departments.  3) Find a Walk-in Clinic If your illness is not likely to be very severe or complicated, you may want to try a walk in clinic. These are popping up all over the country in pharmacies, drugstores, and shopping centers. They're usually staffed by nurse practitioners or physician assistants that have been trained to treat common illnesses and complaints. They're usually fairly quick and inexpensive. However, if you have serious medical issues or chronic medical problems, these are probably not your best option  STD Testing [facility] Department of [facility], STD Clinic, [address] [place], phone [phone] or [phone].  Monday - Friday, call for an appointment. [facility] Department of [employer], STD Clinic, [place] [address], [place], phone [phone] or [phone].  Monday - Friday, call for an appointment.  Abuse/Neglect: [facility] Child Abuse Hotline 781-025-0232 [facility] Child Abuse Hotline (458)158-3273 (After Hours)  Emergency Shelter:  [facility] (734)021-3714  Maternity Homes: Room at the [place] [facility] [phone] [facility] [phone]  MRSA Hotline #:    (506) 222-5026  [facility] Resources  [facility]  United Way [facility] Dept. [address]                 [address]         [address]  [place]                                               Cristobal Goldmann Phone:  [phone]  Phone:  [phone]                   Phone:  [place], [place]- [phone]       -     [facility] in [place], [address]                                  [place] [phone] or [phone] (After Hours)   [place]  Substance Abuse Resources: Alcohol and Drug Services  360-205-1511 [place] 2504862001 [facility] [place] [address] 501 425 3739 Residential & Outpatient Substance Abuse Program  [phone]  Psychological Services: [place]  [phone] [place]  [place], [place]. [address] [place], [place]: [phone] or [phone], PicCapture.uy  Dental Assistance  If unable to pay or uninsured, contact:  [facility] or [facility]. to become qualified for the adult dental clinic.  Patients with Medicaid: [facility] [phone] [address], [place] [address], [phone]  If unable to pay, or uninsured, contact [facility] [phone])622-0772) or [employer] [phone] in [place], [place] in [facility]) to become qualified for the adult dental clinic   Other [place]- [place], [place], [place], [address], [place], Start, 2nd and 4th Thursday of the month at 6:30am.  10 clients each day  by appointment, can sometimes see walk-in patients if someone does not show for an appointment. [facility]- [address] [place]ard Danker [place], [place], [address], [place], [place], [place], [address], [place] Department- [phone] [place] [facility] [phone]

## 2017-09-20 NOTE — ED Provider Notes (Signed)
[name] Provider Note   CSN: [id number] Arrival date & time: 09/20/17  [id number]     History   Chief Complaint Chief Complaint  Patient presents with  . Foot Injury    HPI Kyle CaiJohn L Montgomery is a 42 y.o. male presenting for 2 days of right foot pain and swelling.  Patient states that he was walking when he kicked his right foot into his left foot.  Patient states that pain was immediate, 5/10 in severity throbbing in nature and worsened with walking.  Patient states that when he woke up this morning he noticed his right foot had some swelling to the medial aspect and presented to the emergency department.  Patient states that he has been taking Tylenol for his pain with relief.  Patient denies fever, numbness/tingling, weakness, color change.  HPI  Past Medical History:  Diagnosis Date  . Bipolar affective (HCC)    followed by mental health  . Gynecomastia 02/07   normal pituitary by MRI  . 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 Hypertension   . Renal insufficiency    baseline Cr 2.2 - renal US with increased Echo signal, no nephrology eval, no bx    Patient Active Problem List   Diagnosis Date Noted  . Type 2 diabetes, diet controlled (HCC) 04/15/2017  . Hip pain, left 04/09/2016  . Abnormal finding of diagnostic imaging 04/09/2016  . Sprain of ankle 02/26/2016  . Hyperlipidemia 09/06/2014  . Healthcare maintenance 12/01/2012  . CKD (chronic kidney disease), stage III (HCC) 09/01/2012  . Bipolar disorder (HCC) 11/06/2005  . Essential hypertension 11/06/2005    History reviewed. No pertinent surgical history.      Home Medications    Prior to Admission medications   Medication Sig Start Date End Date Taking? Authorizing Provider  acetaminophen (TYLENOL) 500 MG tablet Take 1 tablet (500 mg total) by mouth every 6 (six) hours as needed. Patient taking differently: Take 500 mg by mouth every 6  (six) hours as needed for mild pain, moderate pain or headache.  12/13/16   Kirichenko, Tatyana, PA-C  amLODipine (NORVASC) 10 MG tablet Take 1 tablet (10 mg total) by mouth daily. 11/12/16   Ginger CarneHarden, Kyler, MD  benzonatate (TESSALON) 100 MG capsule Take 1 capsule (100 mg total) by mouth every 8 (eight) hours. 09/03/17   Hedges, Tinnie GensJeffrey, PA-C  diclofenac sodium (VOLTAREN) 1 % GEL Apply 4 g topically 4 (four) times daily. Patient taking differently: Apply 4 g topically 4 (four) times daily as needed (pain).  02/27/17   Aviva KluverMurray, Alyssa B, PA-C  docusate sodium (COLACE) 100 MG capsule Take 1 capsule (100 mg total) by mouth 2 (two) times daily as needed for mild constipation. 08/28/17   Loren RacerYelverton, David, MD  lisinopril-hydrochlorothiazide (PRINZIDE,ZESTORETIC) 20-12.5 MG tablet Take 2 tablets by mouth daily. 11/12/16 11/12/17  Ginger CarneHarden, Kyler, MD  pantoprazole (PROTONIX) 20 MG tablet Take 1 tablet (20 mg total) by mouth daily. 08/24/17 09/23/17  Long, Arlyss RepressJoshua G, MD  polyethylene glycol [facility](MIRALAX) packet Take 17 g by mouth daily. 08/24/17   Long, Arlyss RepressJoshua G, MD  potassium chloride (K-DUR) 10 MEQ tablet Take 1 tablet (10 mEq total) by mouth daily. 08/23/17   Linwood DibblesKnapp, Jon, MD  sucralfate (CARAFATE) 1 GM/10ML suspension Take 10 mLs (1 g total) by mouth 4 (four) times daily -  with meals and at bedtime for 14 days. 09/12/17 09/26/17  Jeannie FendMurphy, Laura A, PA-C    Family History Family  History  Problem Relation Age of Onset  . Hyperlipidemia Mother   . Hypertension Mother   . Atrial fibrillation Mother   . Hypertension Brother   . Hypertension Maternal Grandmother   . Cancer Maternal Grandmother     Social History Social History   Tobacco Use  . Smoking status: Former Smoker    Types: Cigarettes  . Smokeless tobacco: Never Used  Substance Use Topics  . Alcohol use: No    Alcohol/week: 0.0 standard drinks  . Drug use: No     Allergies   Patient has no known allergies.   Review of Systems Review of Systems    Constitutional: Negative.  Negative for chills, fatigue and fever.  Musculoskeletal: Positive for arthralgias and joint swelling.  Skin: Negative for color change and wound.  Neurological: Negative.  Negative for dizziness, syncope, weakness, numbness and headaches.     Physical Exam Updated Vital Signs BP (!) 130/104 (BP Location: Left Arm)   Pulse 86   Temp 98.4 F (36.9 C) (Oral)   Resp 20   SpO2 100%   Physical Exam  Constitutional: He is oriented to person, place, and time. He appears well-developed and well-nourished. No distress.  HENT:  Head: Normocephalic and atraumatic.  Right Ear: External ear normal.  Left Ear: External ear normal.  Nose: Nose normal.  Eyes: Pupils are equal, round, and reactive to light. EOM are normal.  Neck: Trachea normal and normal range of motion. No tracheal deviation present.  Cardiovascular:  Pulses:      Dorsalis pedis pulses are 2+ on the right side, and 2+ on the left side.       Posterior tibial pulses are 2+ on the right side, and 2+ on the left side.  Pulmonary/Chest: Effort normal. No respiratory distress.  Abdominal: There is no tenderness.  Musculoskeletal: Normal range of motion.       Right knee: Normal.       Left knee: Normal.       Right ankle: Normal.       Left ankle: Normal.       Right lower leg: Normal.       Left lower leg: Normal.       Right foot: There is tenderness and swelling. There is normal range of motion, normal capillary refill and no deformity.       Left foot: There is normal range of motion.       Feet:  Patient with moderate amount of swelling to right medial foot, tenderness to palpation of this area.  No color change present.  Patient with full active range of motion of the right ankle without pain or tenderness to palpation.  Patient with capillary refill intact to all toes.  5/5 strength with dorsi and plantar flexion.  Full active range of motion of all toes.  Sensation to light touch intact to  right lower extremity including all toes.  All compartments are soft.  No increased warmth, redness or streaking, no fluctuance or induration.  Feet:  Right Foot:  Protective Sensation: 3 sites tested. 3 sites sensed.  Left Foot:  Protective Sensation: 3 sites tested. 3 sites sensed.  Neurological: He is alert and oriented to person, place, and time.  Skin: Skin is warm and dry.  Psychiatric: He has a normal mood and affect. His behavior is normal.     ED Treatments / Results  Labs (all labs ordered are listed, but only abnormal results are displayed) Labs Reviewed - No  data to display  EKG None  Radiology Dg Foot Complete Right  Result Date: 09/20/2017 CLINICAL DATA:  42 year old who tripped and fell 2 days ago, injuring the RIGHT foot. Persistent pain and swelling. Initial encounter. EXAM: RIGHT FOOT COMPLETE - 3+ VIEW COMPARISON:  08/03/2016, 09/11/2008. FINDINGS: No evidence of acute fracture or dislocation. Joint spaces well preserved. Well-preserved bone mineral density. Very small plantar calcaneal spur, unchanged since the July, 2018 examination. No significant intrinsic osseous abnormalities. IMPRESSION: No acute or significant osseous abnormality. Very small plantar calcaneal spur. Electronically Signed   By: Hulan Saas M.D.   On: 09/20/2017 11:43    Procedures Procedures (including critical care time)  Medications Ordered in ED Medications - No data to display   Initial Impression / Assessment and Plan / ED Course  I have reviewed the triage vital signs and the nursing notes.  Pertinent labs & imaging results that were available during my care of the patient were reviewed by me and considered in my medical decision making (see chart for details).    The patient was noted to have elevated BP in ED today. Hx of HTN and has not taken daily medications today. I have spoken with the patient regarding elevated blood pressure readings and the need for improved  management. I instructed the patient to followup with their PCP within 1 week for BP check. I also counseled the patient regarding the signs and symptoms which would require an emergent visit to an emergency department for hypertensive urgency and/or hypertensive emergency.  Patient presented with right foot pain that has been treating with Tylenol with relief.  Imaging today negative for acute findings.  Patient provided with ASO and crutches here in emergency department.  Patient encouraged to use rest ice compression elevation for his pain.  Patient given Ortho follow-up as needed.  No signs of infection, no signs of compartment syndrome, patient with full range of motion and strength.  Sensation to light touch intact.  Patient is afebrile, not tachycardic.  At this time there does not appear to be any evidence of an acute emergency medical condition and the patient appears stable for discharge with appropriate outpatient follow up. Diagnosis was discussed with patient who verbalizes understanding of care plan and is agreeable to discharge. I have discussed return precautions with patient whos verbalize understanding of return precautions. Patient strongly encouraged to follow-up with their PCP. All questions answered.    Note: Portions of this report may have been transcribed using voice recognition software. Every effort was made to ensure accuracy; however, inadvertent computerized transcription errors may still be present.   Final Clinical Impressions(s) / ED Diagnoses   Final diagnoses:  Sprain of right ankle, unspecified ligament, initial encounter  Hypertension, unspecified type    ED Discharge Orders    None       Elizabeth Palau 09/20/17 1710    Bethann Berkshire, MD 09/21/17 [phone]

## 2017-09-23 ENCOUNTER — Encounter: Payer: Self-pay | Admitting: Internal Medicine

## 2017-09-23 ENCOUNTER — Encounter: Payer: Self-pay | Admitting: Dietician

## 2017-10-02 ENCOUNTER — Emergency Department (HOSPITAL_COMMUNITY)
Admission: EM | Admit: 2017-10-02 | Discharge: 2017-10-02 | Discharge status: Against Medical Advice | Payer: Medicaid Other | Attending: Emergency Medicine | Admitting: Emergency Medicine

## 2017-10-02 ENCOUNTER — Other Ambulatory Visit: Payer: Self-pay

## 2017-10-02 ENCOUNTER — Encounter (HOSPITAL_COMMUNITY): Payer: Self-pay | Admitting: Emergency Medicine

## 2017-10-02 DIAGNOSIS — I10 Essential (primary) hypertension: Secondary | ICD-10-CM | POA: Diagnosis not present

## 2017-10-02 DIAGNOSIS — Z5321 Procedure and treatment not carried out due to patient leaving prior to being seen by health care provider: Secondary | ICD-10-CM | POA: Diagnosis not present

## 2017-10-02 NOTE — ED Notes (Signed)
Have called pt twice with no response

## 2017-10-02 NOTE — ED Triage Notes (Signed)
Pt presents with feeling "sick", when asked to elaborate, patient unable to, just states " you know, just regular sick"; pt says he is a BP patient and takes lisinopril and amlodipine; pt denies CP, headache, sob

## 2017-10-02 NOTE — ED Notes (Signed)
Called pt third time, without response.

## 2017-10-07 ENCOUNTER — Emergency Department (HOSPITAL_COMMUNITY)
Admission: EM | Admit: 2017-10-07 | Discharge: 2017-10-07 | Disposition: A | Discharge status: Home / Self Care | Payer: Medicaid Other | Attending: Emergency Medicine | Admitting: Emergency Medicine

## 2017-10-07 ENCOUNTER — Encounter (HOSPITAL_COMMUNITY): Payer: Self-pay | Admitting: Emergency Medicine

## 2017-10-07 ENCOUNTER — Other Ambulatory Visit: Payer: Self-pay

## 2017-10-07 DIAGNOSIS — E119 Type 2 diabetes mellitus without complications: Secondary | ICD-10-CM | POA: Diagnosis not present

## 2017-10-07 DIAGNOSIS — Z87891 Personal history of nicotine dependence: Secondary | ICD-10-CM | POA: Diagnosis not present

## 2017-10-07 DIAGNOSIS — I10 Essential (primary) hypertension: Secondary | ICD-10-CM

## 2017-10-07 DIAGNOSIS — Z79899 Other long term (current) drug therapy: Secondary | ICD-10-CM | POA: Insufficient documentation

## 2017-10-07 NOTE — ED Notes (Signed)
Patient able to ambulate independently  

## 2017-10-07 NOTE — Discharge Instructions (Addendum)
Monitor your blood pressure, record results with a time of day and take this record to your primary care doctor to discuss your blood pressure management.

## 2017-10-07 NOTE — ED Triage Notes (Signed)
Pt states he is concerned because his blood pressure "goes up and down", he is unsure if he should change his medication. No complaints at this time.

## 2017-10-07 NOTE — ED Provider Notes (Signed)
[name] Provider [facility]   CSN: [id number] Arrival date & time: 10/07/17  2052     History   Chief Complaint Chief Complaint  Patient presents with  . Hypertension    HPI Kyle CaiJohn L Montgomery is a 42 y.o. male.  42 year old male presents with complaint for fluctuating blood pressure.  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  Patient is managed by the community clinic, takes his blood pressure medication every morning as prescribed.  No other complaints or concerns.     Past Medical History:  Diagnosis Date  . Bipolar affective (HCC)    followed by mental health  . Gynecomastia 02/07   normal pituitary by MRI  . 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 Hypertension   . Renal insufficiency    baseline Cr 2.2 - renal US with increased Echo signal, no nephrology eval, no bx    Patient Active Problem List   Diagnosis Date Noted  . Type 2 diabetes, diet controlled (HCC) 04/15/2017  . Hip pain, left 04/09/2016  . Abnormal finding of diagnostic imaging 04/09/2016  . Sprain of ankle 02/26/2016  . Hyperlipidemia 09/06/2014  . Healthcare maintenance 12/01/2012  . CKD (chronic kidney disease), stage III (HCC) 09/01/2012  . Bipolar disorder (HCC) 11/06/2005  . Essential hypertension 11/06/2005    Past Surgical History:  Procedure Laterality Date  . DENTAL SURGERY          Home Medications    Prior to Admission medications   Medication Sig Start Date End Date Taking? Authorizing Provider  acetaminophen (TYLENOL) 500 MG tablet Take 1 tablet (500 mg total) by mouth every 6 (six) hours as needed. Patient taking differently: Take 500 mg by mouth every 6 (six) hours as  needed for mild pain, moderate pain or headache.  12/13/16   Kirichenko, Tatyana, PA-C  amLODipine (NORVASC) 10 MG tablet Take 1 tablet (10 mg total) by mouth daily. 11/12/16   Ginger CarneHarden, Kyler, MD  benzonatate (TESSALON) 100 MG capsule Take 1 capsule (100 mg total) by mouth every 8 (eight) hours. 09/03/17   Hedges, Tinnie GensJeffrey, PA-C  diclofenac sodium (VOLTAREN) 1 % GEL Apply 4 g topically 4 (four) times daily. Patient taking differently: Apply 4 g topically 4 (four) times daily as needed (pain).  02/27/17   Aviva KluverMurray, Alyssa B, PA-C  docusate sodium (COLACE) 100 MG capsule Take 1 capsule (100 mg total) by mouth 2 (two) times daily as needed for mild constipation. 08/28/17   Loren RacerYelverton, David, MD  lisinopril-hydrochlorothiazide (PRINZIDE,ZESTORETIC) 20-12.5 MG tablet Take 2 tablets by mouth daily. 11/12/16 11/12/17  Ginger CarneHarden, Kyler, MD  pantoprazole (PROTONIX) 20 MG tablet Take 1 tablet (20 mg total) by mouth daily. 08/24/17 09/23/17  Long, Arlyss RepressJoshua G, MD  polyethylene glycol [facility](MIRALAX) packet Take 17 g by mouth daily. 08/24/17   Long, Arlyss RepressJoshua G, MD  potassium chloride (K-DUR) 10 MEQ tablet Take 1 tablet (10 mEq total) by mouth daily. 08/23/17   Linwood DibblesKnapp, Jon, MD  sucralfate (CARAFATE) 1 GM/10ML suspension Take 10 mLs (1 g total) by mouth 4 (four) times daily -  with meals and at bedtime for 14 days. 09/12/17 09/26/17  Jeannie FendMurphy, Porter Moes A, PA-C    Family History Family History  Problem Relation Age of Onset  . Hyperlipidemia Mother   . Hypertension Mother   . Atrial fibrillation Mother   . Hypertension Brother   . Hypertension Maternal Grandmother   . Cancer Maternal Grandmother     Social History Social History   Tobacco Use  . Smoking status: Former Smoker    Types: Cigarettes  . Smokeless tobacco: Never Used  Substance Use Topics  . Alcohol use: No    Alcohol/week: 0.0 standard drinks  . Drug use: No     Allergies   Patient has no known allergies.   Review of Systems Review of Systems  Constitutional:  Negative for fever.  Eyes: Negative for visual disturbance.  Respiratory: Negative for shortness of breath.   Cardiovascular: Negative for chest pain.  Musculoskeletal: Negative for arthralgias and myalgias.  Skin: Negative for rash and wound.  Neurological: Negative for dizziness, weakness and headaches.  Psychiatric/Behavioral: Negative for confusion.  All other systems reviewed and are negative.    Physical Exam Updated Vital Signs BP (!) 134/96 (BP Location: Right Arm)   Pulse 90   Temp 98.7 F (37.1 C) (Oral)   Resp 16   SpO2 98%   Physical Exam  Constitutional: He is oriented to person, place, and time. He appears well-developed and well-nourished. No distress.  HENT:  Head: Normocephalic and atraumatic.  Cardiovascular: Normal rate, regular rhythm, normal heart sounds and intact distal pulses.  No murmur heard. Pulmonary/Chest: Effort normal and breath sounds normal. No respiratory distress.  Neurological: He is alert and oriented to person, place, and time.  Skin: He is not diaphoretic.  Psychiatric: He has a normal mood and affect. His behavior is normal.  Nursing note and vitals reviewed.    ED Treatments / Results  Labs (all labs ordered are listed, but only abnormal results are displayed) Labs Reviewed - No data to display  EKG None  Radiology No results found.  Procedures Procedures (including critical care time)  Medications Ordered in ED Medications - No data to display   Initial Impression / Assessment and Plan / ED Course  I have reviewed the triage vital signs and the nursing notes.  Pertinent labs & imaging results that were available during my care of the patient were reviewed by me and considered in my medical decision making (see chart for details).  Clinical Course as of Oct 07 2300  Tue Oct 07, 2017  472 42 year old male presents with concern for blood pressure symptoms higher and sometimes lower throughout the day.  Advised patient  to keep a record of his blood pressure readings and take this report to his PCP for follow-up.  Patient denies any other complaints at this time.   [LM]    Clinical Course User Index [LM] Jeannie Fend, PA-C   Final Clinical Impressions(s) / ED Diagnoses   Final diagnoses:  Hypertension, unspecified type    ED Discharge Orders    None       Alden Hipp 10/07/17 2302    Raeford Razor, MD 10/16/17 [phone]

## 2017-10-08 ENCOUNTER — Other Ambulatory Visit: Payer: Self-pay

## 2017-10-08 ENCOUNTER — Emergency Department (HOSPITAL_COMMUNITY)
Admission: EM | Admit: 2017-10-08 | Discharge: 2017-10-08 | Disposition: A | Discharge status: Home / Self Care | Payer: Medicaid Other | Attending: Emergency Medicine | Admitting: Emergency Medicine

## 2017-10-08 ENCOUNTER — Emergency Department (HOSPITAL_COMMUNITY): Payer: Medicaid Other

## 2017-10-08 DIAGNOSIS — E1122 Type 2 diabetes mellitus with diabetic chronic kidney disease: Secondary | ICD-10-CM | POA: Insufficient documentation

## 2017-10-08 DIAGNOSIS — N183 Chronic kidney disease, stage 3 (moderate): Secondary | ICD-10-CM | POA: Diagnosis not present

## 2017-10-08 DIAGNOSIS — M25472 Effusion, left ankle: Secondary | ICD-10-CM

## 2017-10-08 DIAGNOSIS — I129 Hypertensive chronic kidney disease with stage 1 through stage 4 chronic kidney disease, or unspecified chronic kidney disease: Secondary | ICD-10-CM | POA: Insufficient documentation

## 2017-10-08 DIAGNOSIS — Z79899 Other long term (current) drug therapy: Secondary | ICD-10-CM | POA: Diagnosis not present

## 2017-10-08 DIAGNOSIS — F319 Bipolar disorder, unspecified: Secondary | ICD-10-CM | POA: Insufficient documentation

## 2017-10-08 DIAGNOSIS — M25572 Pain in left ankle and joints of left foot: Secondary | ICD-10-CM | POA: Diagnosis present

## 2017-10-08 MED ORDER — ACETAMINOPHEN 325 MG PO TABS: 650.0000 mg | ORAL_TABLET | Freq: Four times a day (QID) | ORAL | 0 refills | Status: DC | PRN

## 2017-10-08 MED ORDER — ACETAMINOPHEN 325 MG PO TABS
650.0000 mg | ORAL_TABLET | Freq: Once | ORAL | Status: AC
  Administered 2017-10-08: 650 mg via ORAL
  Filled 2017-10-08: qty 2

## 2017-10-08 NOTE — Discharge Instructions (Signed)
Return to ED for worsening symptoms, injuries or falls, red hot or tender joint, increased swelling.

## 2017-10-08 NOTE — ED Triage Notes (Signed)
Patient arrives with c/o left ankle pain x3 days, denies any recent injury, patient unable to put much weight on left ankle. Hx HTN.

## 2017-10-08 NOTE — ED Provider Notes (Signed)
[facility]-EMERGENCY DEPT Provider Note   CSN: [id number] Arrival date & time: 10/08/17  1422     History   Chief Complaint Chief Complaint  Patient presents with  . Ankle Pain    HPI Kyle Montgomery is a 42 y.o. male with a past medical history of hypertension, bipolar disorder, CKD, who presents to ED for evaluation of 2-day history of left ankle pain.  States that the pain has gradually gotten worse.  Reports history of similar symptoms in the past.  He has not taken any medications to help with his symptoms.  He cannot recall any injury but states that he may have sprained it.  Has been using crutches to ambulate.  Denies any prior fracture, dislocations or procedures in the area, history of DVT or gout, history of septic joint, fever, leg swelling.  HPI  Past Medical History:  Diagnosis Date  . Bipolar affective (HCC)    followed by mental health  . Gynecomastia 02/07   normal pituitary by MRI  . 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 Hypertension   . Renal insufficiency    baseline Cr 2.2 - renal US with increased Echo signal, no nephrology eval, no bx    Patient Active Problem List   Diagnosis Date Noted  . Type 2 diabetes, diet controlled (HCC) 04/15/2017  . Hip pain, left 04/09/2016  . Abnormal finding of diagnostic imaging 04/09/2016  . Sprain of ankle 02/26/2016  . Hyperlipidemia 09/06/2014  . Healthcare maintenance 12/01/2012  . CKD (chronic kidney disease), stage III (HCC) 09/01/2012  . Bipolar disorder (HCC) 11/06/2005  . Essential hypertension 11/06/2005    Past Surgical History:  Procedure Laterality Date  . DENTAL SURGERY          Home Medications    Prior to Admission medications   Medication Sig Start Date End Date Taking? Authorizing Provider  acetaminophen (TYLENOL) 325 MG tablet Take 2 tablets (650 mg total) by mouth every 6 (six) hours as needed. 10/08/17   Regine Christian, PA-C    amLODipine (NORVASC) 10 MG tablet Take 1 tablet (10 mg total) by mouth daily. 11/12/16   Ginger CarneHarden, Kyler, MD  benzonatate (TESSALON) 100 MG capsule Take 1 capsule (100 mg total) by mouth every 8 (eight) hours. 09/03/17   Hedges, Tinnie GensJeffrey, PA-C  diclofenac sodium (VOLTAREN) 1 % GEL Apply 4 g topically 4 (four) times daily. Patient taking differently: Apply 4 g topically 4 (four) times daily as needed (pain).  02/27/17   Aviva KluverMurray, Alyssa B, PA-C  docusate sodium (COLACE) 100 MG capsule Take 1 capsule (100 mg total) by mouth 2 (two) times daily as needed for mild constipation. 08/28/17   Loren RacerYelverton, David, MD  lisinopril-hydrochlorothiazide (PRINZIDE,ZESTORETIC) 20-12.5 MG tablet Take 2 tablets by mouth daily. 11/12/16 11/12/17  Ginger CarneHarden, Kyler, MD  pantoprazole (PROTONIX) 20 MG tablet Take 1 tablet (20 mg total) by mouth daily. 08/24/17 09/23/17  Long, Arlyss RepressJoshua G, MD  polyethylene glycol [facility](MIRALAX) packet Take 17 g by mouth daily. 08/24/17   Long, Arlyss RepressJoshua G, MD  potassium chloride (K-DUR) 10 MEQ tablet Take 1 tablet (10 mEq total) by mouth daily. 08/23/17   Linwood DibblesKnapp, Jon, MD  sucralfate (CARAFATE) 1 GM/10ML suspension Take 10 mLs (1 g total) by mouth 4 (four) times daily -  with meals and at bedtime for 14 days. 09/12/17 09/26/17  Jeannie FendMurphy, Laura A, PA-C    Family History Family History  Problem Relation Age of Onset  . Hyperlipidemia  Mother   . Hypertension Mother   . Atrial fibrillation Mother   . Hypertension Brother   . Hypertension Maternal Grandmother   . Cancer Maternal Grandmother     Social History Social History   Tobacco Use  . Smoking status: Former Smoker    Types: Cigarettes  . Smokeless tobacco: Never Used  Substance Use Topics  . Alcohol use: No    Alcohol/week: 0.0 standard drinks  . Drug use: No     Allergies   Patient has no known allergies.   Review of Systems Review of Systems  Constitutional: Negative for chills and fever.  Musculoskeletal: Positive for arthralgias, gait problem and  myalgias. Negative for back pain and joint swelling.  Skin: Negative for wound.  Neurological: Negative for weakness and numbness.     Physical Exam Updated Vital Signs BP (!) 131/97 (BP Location: Right Arm)   Pulse 97   Temp 99.3 F (37.4 C) (Oral)   Resp 16   Ht 5\' 10"  (1.778 m)   Wt 79.4 kg   SpO2 99%   BMI 25.11 kg/m   Physical Exam  Constitutional: He appears well-developed and well-nourished. No distress.  HENT:  Head: Normocephalic and atraumatic.  Eyes: Conjunctivae and EOM are normal. No scleral icterus.  Neck: Normal range of motion.  Pulmonary/Chest: Effort normal. No respiratory distress.  Musculoskeletal:       Right foot: There is tenderness and bony tenderness.       Feet:  Tenderness palpation of the left lateral ankle and below.  Full active and passive range of motion of joint without difficulty.  No erythema, edema or warmth of joint noted.  No calf tenderness noted.  2+ DP pulse palpated.  Strength 5/5 in bilateral feet.  Neurological: He is alert.  Skin: No rash noted. He is not diaphoretic.  Psychiatric: He has a normal mood and affect.  Nursing note and vitals reviewed.    ED Treatments / Results  Labs (all labs ordered are listed, but only abnormal results are displayed) Labs Reviewed - No data to display  EKG None  Radiology Dg Ankle Complete Left  Result Date: 10/08/2017 CLINICAL DATA:  Left ankle pain for 3 days.  No known injury. EXAM: LEFT ANKLE COMPLETE - 3+ VIEW COMPARISON:  10/07/2015 FINDINGS: There is no evidence of fracture or dislocation. There is a small ankle joint effusion no discrete arthritis. Posterior calcaneal enthesophyte at the Achilles insertion, unchanged. Soft tissues are unremarkable. IMPRESSION: Small nonspecific ankle effusion. No other significant abnormalities. Electronically Signed   By: Francene Boyers M.D.   On: 10/08/2017 16:01    Procedures Procedures (including critical care time)  Medications Ordered in  ED Medications  acetaminophen (TYLENOL) tablet 650 mg (has no administration in time range)     Initial Impression / Assessment and Plan / ED Course  I have reviewed the triage vital signs and the nursing notes.  Pertinent labs & imaging results that were available during my care of the patient were reviewed by me and considered in my medical decision making (see chart for details).     42 year old male with past medical history of hypertension presents to ED for evaluation of 2-day history of left ankle pain.  Pain is gradually gotten worse.  Cannot recall any injury but states that he may have sprained it.  States the pain is worse with bearing weight.  On exam there is mild tenderness palpation of the left lateral malleolus and below.  No changes  to range of motion of joints noted.  Area is neurovascularly intact.  No erythema, edema or warmth of joint noted.  Low suspicion for infectious or vascular cause of symptoms.  X-ray shows nonspecific minimal effusion.  Will discharge home with supportive measures including Tylenol, ankle brace and orthopedic follow-up.  Advised to return to ED for any severe worsening symptoms.  Portions of this note were generated with Scientist, clinical (histocompatibility and immunogenetics). Dictation errors may occur despite best attempts at proofreading.   Final Clinical Impressions(s) / ED Diagnoses   Final diagnoses:  Effusion of left ankle    ED Discharge Orders         Ordered    acetaminophen (TYLENOL) 325 MG tablet  Every 6 hours PRN     10/08/17 1605           Dietrich Pates, PA-C 10/08/17 1607    Little, Ambrose Finland, MD 10/08/17 [phone]

## 2017-10-09 ENCOUNTER — Encounter (HOSPITAL_COMMUNITY): Payer: Self-pay

## 2017-10-09 ENCOUNTER — Emergency Department (HOSPITAL_COMMUNITY)
Admission: EM | Admit: 2017-10-09 | Discharge: 2017-10-09 | Disposition: A | Discharge status: Home / Self Care | Payer: Medicaid Other | Attending: Emergency Medicine | Admitting: Emergency Medicine

## 2017-10-09 ENCOUNTER — Other Ambulatory Visit: Payer: Self-pay

## 2017-10-09 DIAGNOSIS — M25572 Pain in left ankle and joints of left foot: Secondary | ICD-10-CM | POA: Insufficient documentation

## 2017-10-09 DIAGNOSIS — N183 Chronic kidney disease, stage 3 (moderate): Secondary | ICD-10-CM | POA: Insufficient documentation

## 2017-10-09 DIAGNOSIS — E1122 Type 2 diabetes mellitus with diabetic chronic kidney disease: Secondary | ICD-10-CM | POA: Insufficient documentation

## 2017-10-09 DIAGNOSIS — I129 Hypertensive chronic kidney disease with stage 1 through stage 4 chronic kidney disease, or unspecified chronic kidney disease: Secondary | ICD-10-CM | POA: Insufficient documentation

## 2017-10-09 DIAGNOSIS — Z87891 Personal history of nicotine dependence: Secondary | ICD-10-CM | POA: Insufficient documentation

## 2017-10-09 DIAGNOSIS — R600 Localized edema: Secondary | ICD-10-CM | POA: Insufficient documentation

## 2017-10-09 DIAGNOSIS — Z79899 Other long term (current) drug therapy: Secondary | ICD-10-CM | POA: Diagnosis not present

## 2017-10-09 MED ORDER — PREDNISONE 20 MG PO TABS
60.0000 mg | ORAL_TABLET | Freq: Once | ORAL | Status: AC
  Administered 2017-10-09: 60 mg via ORAL
  Filled 2017-10-09: qty 3

## 2017-10-09 MED ORDER — NAPROXEN 500 MG PO TABS: 500.0000 mg | ORAL_TABLET | Freq: Two times a day (BID) | ORAL | 0 refills | Status: DC

## 2017-10-09 MED ORDER — ACETAMINOPHEN 500 MG PO TABS
1000.0000 mg | ORAL_TABLET | Freq: Once | ORAL | Status: AC
  Administered 2017-10-09: 1000 mg via ORAL
  Filled 2017-10-09: qty 2

## 2017-10-09 MED ORDER — NAPROXEN 250 MG PO TABS
500.0000 mg | ORAL_TABLET | Freq: Once | ORAL | Status: AC
  Administered 2017-10-09: 500 mg via ORAL
  Filled 2017-10-09: qty 2

## 2017-10-09 MED ORDER — PREDNISONE 10 MG (21) PO TBPK: ORAL_TABLET | Freq: Every day | ORAL | 0 refills | Status: DC

## 2017-10-09 NOTE — ED Notes (Signed)
ED Provider at bedside. 

## 2017-10-09 NOTE — Discharge Instructions (Addendum)
Your ankle pain may be from ligament inflammation or injury or possibly gout.  Both are treated similarly.    Take prednisone taper as prescribed, this can help with inflammation associated with gout.   For pain control take 1000 mg acetaminophen (tylenol) every 8 hours plus 500 mg naproxen every 12 hours for the next 5 days.   Given medicine 2-3 days to start working  Return to ER for ankle, foot, leg swelling, redness, warmth, pain, fevers, chills.

## 2017-10-09 NOTE — ED Provider Notes (Signed)
[name] EMERGENCY DEPARTMENT Provider Note   CSN: [id number] Arrival date & time: 10/09/17  8469     History   Chief Complaint Chief Complaint  Patient presents with  . Ankle Pain    HPI Kyle Montgomery is a 42 y.o. male is here for evaluation of left ankle pain.  Sudden onset 3 days ago, gradually worsening and persistent, constant, throbbing in nature.  Pain is located to the left lateral ankle and is nonradiating.  He was seen by ER yesterday where he had an x-ray that showed nonspecific effusion but no bony injury.  He was discharged with postop boot, crutches and Tylenol however symptoms refractory and worsening.  Patient states the pain is severe, constant, worse with any palpation, weightbearing or movement of the ankle.  He was unable to sleep last night secondary to pain.  Since it his ER visit yesterday he has taken 650 mg of Tylenol without relief.  He denies any preceding trauma, previous injury, surgery or procedures to the joint.  He denies IV drug use or injections.  He denies history of gout but states in the last week he has eaten shrimp and fish.  No recent alcohol intake.  Denies associated fevers, chills, ankle swelling redness warmth, calf pain or swelling, distal paresthesias or loss of sensation.  HPI  Past Medical History:  Diagnosis Date  . Bipolar affective (HCC)    followed by mental health  . Gynecomastia 02/07   normal pituitary by MRI  . 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 Hypertension   . Renal insufficiency    baseline Cr 2.2 - renal US with increased Echo signal, no nephrology eval, no bx    Patient Active Problem List   Diagnosis Date Noted  . Type 2 diabetes, diet controlled (HCC) 04/15/2017  . Hip pain, left 04/09/2016  . Abnormal finding of diagnostic imaging 04/09/2016  . Sprain of ankle 02/26/2016  . Hyperlipidemia 09/06/2014  . Healthcare maintenance 12/01/2012  . CKD (chronic  kidney disease), stage III (HCC) 09/01/2012  . Bipolar disorder (HCC) 11/06/2005  . Essential hypertension 11/06/2005    Past Surgical History:  Procedure Laterality Date  . DENTAL SURGERY          Home Medications    Prior to Admission medications   Medication Sig Start Date End Date Taking? Authorizing Provider  acetaminophen (TYLENOL) 325 MG tablet Take 2 tablets (650 mg total) by mouth every 6 (six) hours as needed. 10/08/17   Khatri, Hina, PA-C  amLODipine (NORVASC) 10 MG tablet Take 1 tablet (10 mg total) by mouth daily. 11/12/16   Ginger Carne, MD  benzonatate (TESSALON) 100 MG capsule Take 1 capsule (100 mg total) by mouth every 8 (eight) hours. 09/03/17   Hedges, Tinnie Gens, PA-C  diclofenac sodium (VOLTAREN) 1 % GEL Apply 4 g topically 4 (four) times daily. Patient taking differently: Apply 4 g topically 4 (four) times daily as needed (pain).  02/27/17   Aviva Kluver B, PA-C  docusate sodium (COLACE) 100 MG capsule Take 1 capsule (100 mg total) by mouth 2 (two) times daily as needed for mild constipation. 08/28/17   Loren Racer, MD  lisinopril-hydrochlorothiazide (PRINZIDE,ZESTORETIC) 20-12.5 MG tablet Take 2 tablets by mouth daily. 11/12/16 11/12/17  Ginger Carne, MD  naproxen (NAPROSYN) 500 MG tablet Take 1 tablet (500 mg total) by mouth 2 (two) times daily. 10/09/17   Liberty Handy, PA-C  pantoprazole (PROTONIX) 20 MG tablet Take  1 tablet (20 mg total) by mouth daily. 08/24/17 09/23/17  Long, Arlyss RepressJoshua G, MD  polyethylene glycol [facility](MIRALAX) packet Take 17 g by mouth daily. 08/24/17   Long, Arlyss RepressJoshua G, MD  potassium chloride (K-DUR) 10 MEQ tablet Take 1 tablet (10 mEq total) by mouth daily. 08/23/17   Linwood DibblesKnapp, Jon, MD  predniSONE (STERAPRED UNI-PAK 21 TAB) 10 MG (21) TBPK tablet Take by mouth daily. 6 tabsx2 days, then 5 tabsx2 days, then 4 tabsx2 days, then 3 tabsx2 days, 2 tabsx2 days, then 1 tab daily for 2 days 10/09/17   Liberty HandyGibbons, Jamayah Myszka J, PA-C  sucralfate (CARAFATE) 1 GM/10ML  suspension Take 10 mLs (1 g total) by mouth 4 (four) times daily -  with meals and at bedtime for 14 days. 09/12/17 09/26/17  Jeannie FendMurphy, Laura A, PA-C    Family History Family History  Problem Relation Age of Onset  . Hyperlipidemia Mother   . Hypertension Mother   . Atrial fibrillation Mother   . Hypertension Brother   . Hypertension Maternal Grandmother   . Cancer Maternal Grandmother     Social History Social History   Tobacco Use  . Smoking status: Former Smoker    Types: Cigarettes  . Smokeless tobacco: Never Used  Substance Use Topics  . Alcohol use: No    Alcohol/week: 0.0 standard drinks  . Drug use: No     Allergies   Patient has no known allergies.   Review of Systems Review of Systems  Musculoskeletal: Positive for arthralgias and gait problem.  All other systems reviewed and are negative.    Physical Exam Updated Vital Signs BP (!) 144/97 (BP Location: Right Arm)   Pulse 96   Temp 98.4 F (36.9 C) (Oral)   Resp 18   Ht 5\' 10"  (1.778 m)   Wt 79.4 kg   SpO2 100%   BMI 25.11 kg/m   Physical Exam  Constitutional: He is oriented to person, place, and time. He appears well-developed and well-nourished.  Non-toxic appearance.  HENT:  Head: Normocephalic.  Right Ear: External ear normal.  Left Ear: External ear normal.  Nose: Nose normal.  Eyes: Conjunctivae and EOM are normal.  Neck: Full passive range of motion without pain.  Cardiovascular: Normal rate.  2+ DP and PT pulses bilaterally. No calf edema or tenderness.   Pulmonary/Chest: Effort normal. No tachypnea. No respiratory distress.  Musculoskeletal: Normal range of motion. He exhibits tenderness.  Left ankle: Exquisite tenderness to area below lateral malleolus with very mild edema but no underlying erythema, warmth, fluctuance, skin injury.  Patient is guarding his foot and refuses any active or passive R OM.  Light touch of the skin causes significant pain as well.  No focal bony tenderness to  the calcaneus, malleoli, metatarsals, MTPs or toes.  Left leg: No asymmetric calf edema, tenderness, warmth.  Neurological: He is alert and oriented to person, place, and time.  Sensation to light touch intact in distal feet bilaterally  Skin: Skin is warm and dry. Capillary refill takes less than 2 seconds.  Psychiatric: His behavior is normal. Thought content normal.     ED Treatments / Results  Labs (all labs ordered are listed, but only abnormal results are displayed) Labs Reviewed - No data to display  EKG None  Radiology Dg Ankle Complete Left  Result Date: 10/08/2017 CLINICAL DATA:  Left ankle pain for 3 days.  No known injury. EXAM: LEFT ANKLE COMPLETE - 3+ VIEW COMPARISON:  10/07/2015 FINDINGS: There is no evidence of  fracture or dislocation. There is a small ankle joint effusion no discrete arthritis. Posterior calcaneal enthesophyte at the Achilles insertion, unchanged. Soft tissues are unremarkable. IMPRESSION: Small nonspecific ankle effusion. No other significant abnormalities. Electronically Signed   By: Francene Boyers M.D.   On: 10/08/2017 16:01    Procedures Procedures (including critical care time)  Medications Ordered in ED Medications  naproxen (NAPROSYN) tablet 500 mg (500 mg Oral Given 10/09/17 1009)  acetaminophen (TYLENOL) tablet 1,000 mg (1,000 mg Oral Given 10/09/17 1009)  predniSONE (DELTASONE) tablet 60 mg (60 mg Oral Given 10/09/17 1009)     Initial Impression / Assessment and Plan / ED Course  I have reviewed the triage vital signs and the nursing notes.  Pertinent labs & imaging results that were available during my care of the patient were reviewed by me and considered in my medical decision making (see chart for details).     42 y.o. year old male here with atraumatic monoarticular left ankle/foot pain.  No fevers or chills. No h/o IVDU or immunosuppression. No h/o DVT/PE. Not a prosthetic joint. No preceding trauma.  On exam, there is exquisite  tenderness with light graze of skin and mild edema. No warmth, fluctuance or evidence of overlaying cellulitis. He refuses ROM. Denies trauma.  Considering septic arthritis,  however higher suspicion for OA or soft tissue injury or gout.  Recent intake of high purine foods. Reviewed ER visit documentation and x-ray yesterday.  Pt has also not maximized NSAID dosing at home.  Will dc with prednisone, high dose NSAIDs for presumed gout.  Discussed return precautions, pt is in agreement.   Final Clinical Impressions(s) / ED Diagnoses   Final diagnoses:  Acute left ankle pain    ED Discharge Orders         Ordered    predniSONE (STERAPRED UNI-PAK 21 TAB) 10 MG (21) TBPK tablet  Daily     10/09/17 1010    naproxen (NAPROSYN) 500 MG tablet  2 times daily     10/09/17 1010           Jerrell Mylar 10/09/17 1127    Donnetta Hutching, MD 10/10/17 1220

## 2017-10-09 NOTE — ED Triage Notes (Signed)
PT states that [facility] for left ankle pain, reports increasing pain in left ankle.

## 2017-10-10 ENCOUNTER — Telehealth: Payer: Self-pay | Admitting: Internal Medicine

## 2017-10-10 NOTE — Telephone Encounter (Signed)
Spoke with patient and his mother about his recent inappropriate ED visits. Mother explained about patient's long history of bipolar disorder and how he no longer takes his psych medications and he has been having erratic behaviors, including having inappropriate thought about needing to go to the hospital and the emergency room. Mother assured me that he has sufficient supply of psych meds at home but he simply refuses to take the medicine.  Despite all of her efforts, he has even noticed him hiding the med pills in corner of his mouth and spitting it out in the bathroom and speaking to himself and complaining of hearing voices. She believes he should be institutionalized for his psychiatric disorder and in prior events, he voluntarily committed himself, but he has been refusing. He denies any homicidal or suicidal ideations and believes he is well. Strongly encouraged the patient and her mother to set up an appointment with [facility] clinic to meet me to establish care. Provided mother with phone number and instructions for getting to the clinic.

## 2017-10-17 ENCOUNTER — Other Ambulatory Visit: Payer: Self-pay

## 2017-10-17 ENCOUNTER — Emergency Department (HOSPITAL_COMMUNITY)
Admission: EM | Admit: 2017-10-17 | Discharge: 2017-10-17 | Disposition: A | Discharge status: Home / Self Care | Payer: Medicaid Other | Attending: Emergency Medicine | Admitting: Emergency Medicine

## 2017-10-17 ENCOUNTER — Encounter (HOSPITAL_COMMUNITY): Payer: Self-pay | Admitting: Emergency Medicine

## 2017-10-17 DIAGNOSIS — I10 Essential (primary) hypertension: Secondary | ICD-10-CM | POA: Insufficient documentation

## 2017-10-17 DIAGNOSIS — R42 Dizziness and giddiness: Secondary | ICD-10-CM | POA: Diagnosis present

## 2017-10-17 DIAGNOSIS — Z87891 Personal history of nicotine dependence: Secondary | ICD-10-CM | POA: Diagnosis not present

## 2017-10-17 DIAGNOSIS — Z79899 Other long term (current) drug therapy: Secondary | ICD-10-CM | POA: Diagnosis not present

## 2017-10-17 DIAGNOSIS — R110 Nausea: Secondary | ICD-10-CM | POA: Insufficient documentation

## 2017-10-17 DIAGNOSIS — E119 Type 2 diabetes mellitus without complications: Secondary | ICD-10-CM | POA: Insufficient documentation

## 2017-10-17 LAB — BASIC METABOLIC PANEL
ANION GAP: 11 (ref 5–15)
BUN: 27 mg/dL — AB (ref 6–20)
CALCIUM: 9.2 mg/dL (ref 8.9–10.3)
CO2: 23 mmol/L (ref 22–32)
Chloride: 105 mmol/L (ref 98–111)
Creatinine, Ser: 1.94 mg/dL — ABNORMAL HIGH (ref 0.61–1.24)
GFR calc Af Amer: 47 mL/min — ABNORMAL LOW (ref >60)
GFR, EST NON AFRICAN AMERICAN: 41 mL/min — AB (ref >60)
GLUCOSE: 152 mg/dL — AB (ref 70–99)
Potassium: 3.2 mmol/L — ABNORMAL LOW (ref 3.5–5.1)
Sodium: 139 mmol/L (ref 135–145)

## 2017-10-17 LAB — CBC
HCT: 44.4 % (ref 39.0–52.0)
HEMOGLOBIN: 14.5 g/dL (ref 13.0–17.0)
MCH: 27.8 pg (ref 26.0–34.0)
MCHC: 32.7 g/dL (ref 30.0–36.0)
MCV: 85.2 fL (ref 78.0–100.0)
PLATELETS: 303 10*3/uL (ref 150–400)
RBC: 5.21 MIL/uL (ref 4.22–5.81)
RDW: 12.8 % (ref 11.5–15.5)
WBC: 10 10*3/uL (ref 4.0–10.5)

## 2017-10-17 MED ORDER — POTASSIUM CHLORIDE ER 10 MEQ PO TBCR: 10.0000 meq | EXTENDED_RELEASE_TABLET | Freq: Every day | ORAL | 0 refills | Status: DC

## 2017-10-17 MED ORDER — SODIUM CHLORIDE 0.9 % IV BOLUS
1000.0000 mL | Freq: Once | INTRAVENOUS | Status: AC
  Administered 2017-10-17: 1000 mL via INTRAVENOUS

## 2017-10-17 MED ORDER — ONDANSETRON HCL 4 MG/2ML IJ SOLN
4.0000 mg | Freq: Once | INTRAMUSCULAR | Status: AC
  Administered 2017-10-17: 4 mg via INTRAVENOUS
  Filled 2017-10-17: qty 2

## 2017-10-17 NOTE — Discharge Instructions (Addendum)
Evaluated today for dizziness.  Your lab results were at your baseline and your symptoms were resolved on my exam.  Please return to the ED with any new or worsening symptoms:  Get help right away if: You have pain in your chest, neck, arm, or jaw. You feel extremely weak or you faint. You have vomit that is bright red or looks like coffee grounds. You have bloody or black stools or stools that look like tar. You have a severe headache, a stiff neck, or both. You have severe pain, cramping, or bloating in your abdomen. You have a rash. You have difficulty breathing or are breathing very quickly. Your heart is beating very quickly. Your skin feels cold and clammy. You feel confused. You have pain when you urinate. You have signs of dehydration, such as: Dark urine, very little, or no urine. Cracked lips. Dry mouth. Sunken eyes. Sleepiness. Weakness.

## 2017-10-17 NOTE — ED Provider Notes (Signed)
Patient placed in Quick Look pathway, seen and evaluated   Chief Complaint: dizzy  HPI:   Kyle Montgomery is a 42 y.o. male who presents to the ED with c/o dizziness. Patient reports he was sitting outside and started to feel light headed and have nausea. Patient reports taking his bipolar medication before he came.   ROS: Neuro: dizzy  GI: nausea  Physical Exam:  BP 107/79   Pulse (!) 138   Temp 98.8 F (37.1 C) (Oral)   Resp 18   SpO2 100%    Gen: No distress  Neuro: Awake and Alert  Skin: clammy  Heart: tachycardia   Initiation of care has begun. The patient has been counseled on the process, plan, and necessity for staying for the completion/evaluation, and the remainder of the medical screening examination    Janne Napoleon, NP 10/17/17 1415    Sabas Sous, MD 10/17/17 1715

## 2017-10-17 NOTE — ED Notes (Signed)
Signature pad unavailable at time of pt discharge. Pt verbalized understanding of d/c instrucitons.  

## 2017-10-17 NOTE — ED Triage Notes (Signed)
Pt to ER for near syncope onset today, reports at rest began feeling light headed with nausea. Pt denies pain.

## 2017-10-17 NOTE — ED Provider Notes (Signed)
[name] EMERGENCY DEPARTMENT Provider Note   CSN: [id number] Arrival date & time: 10/17/17  1402   History   Chief Complaint Chief Complaint  Patient presents with  . Near Syncope    HPI Kyle Montgomery is a 42 y.o. male with a past medical history significant for hypertension, renal insufficiency, bipolar affective disorder who presents for evaluation of dizziness.  According to the triage note patient states he was sitting outside and felt lightheaded and nauseous.  On my initial exam patient denies any episodes of dizziness, lightheadedness, near syncope.  Patient states he was sitting outside however had a "wave of nausea".  States he has had these episodes previously where he gets nauseous when he gets overheated. Denies fever, chills, vomiting, chest pain, shortness of breath, headache, vision changes, abdominal pain, diarrhea.  Denies aggravating or alleviating factors.  HPI  Past Medical History:  Diagnosis Date  . Bipolar affective (HCC)    followed by mental health  . Gynecomastia 02/07   normal pituitary by MRI  . 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 Hypertension   . Renal insufficiency    baseline Cr 2.2 - renal US with increased Echo signal, no nephrology eval, no bx    Patient Active Problem List   Diagnosis Date Noted  . Type 2 diabetes, diet controlled (HCC) 04/15/2017  . Hip pain, left 04/09/2016  . Abnormal finding of diagnostic imaging 04/09/2016  . Sprain of ankle 02/26/2016  . Hyperlipidemia 09/06/2014  . Healthcare maintenance 12/01/2012  . CKD (chronic kidney disease), stage III (HCC) 09/01/2012  . Bipolar disorder (HCC) 11/06/2005  . Essential hypertension 11/06/2005    Past Surgical History:  Procedure Laterality Date  . DENTAL SURGERY          Home Medications    Prior to Admission medications   Medication Sig Start Date End Date Taking? Authorizing Provider  acetaminophen  (TYLENOL) 325 MG tablet Take 2 tablets (650 mg total) by mouth every 6 (six) hours as needed. 10/08/17  Yes Khatri, Hina, PA-C  amLODipine (NORVASC) 10 MG tablet Take 1 tablet (10 mg total) by mouth daily. 11/12/16  Yes Ginger Carne, MD  diclofenac sodium (VOLTAREN) 1 % GEL Apply 4 g topically 4 (four) times daily. Patient taking differently: Apply 4 g topically 4 (four) times daily as needed (pain).  02/27/17  Yes [place] Scrape, Alyssa B, PA-C  divalproex (DEPAKOTE ER) 500 MG 24 hr tablet Take 500 mg by mouth at bedtime.   Yes [provider]  docusate sodium (COLACE) 100 MG capsule Take 1 capsule (100 mg total) by mouth 2 (two) times daily as needed for mild constipation. 08/28/17  Yes Loren Racer, MD  lisinopril-hydrochlorothiazide (PRINZIDE,ZESTORETIC) 20-12.5 MG tablet Take 2 tablets by mouth daily. 11/12/16 11/12/17 Yes Ginger Carne, MD  ziprasidone (GEODON) 80 MG capsule Take 80 mg by mouth 2 (two) times daily with a meal.   Yes [provider]  benzonatate (TESSALON) 100 MG capsule Take 1 capsule (100 mg total) by mouth every 8 (eight) hours. Patient not taking: Reported on 10/17/2017 09/03/17   Hedges, Tinnie Gens, PA-C  naproxen (NAPROSYN) 500 MG tablet Take 1 tablet (500 mg total) by mouth 2 (two) times daily. Patient not taking: Reported on 10/17/2017 10/09/17   Liberty Handy, PA-C  pantoprazole (PROTONIX) 20 MG tablet Take 1 tablet (20 mg total) by mouth daily. Patient not taking: Reported on 10/17/2017 08/24/17 09/23/17  Long, Arlyss Repress, MD  polyethylene glycol (MIRALAX) packet Take 17 g by mouth daily. Patient not taking: Reported on 10/17/2017 08/24/17   Long, Arlyss Repress, MD  potassium chloride (K-DUR) 10 MEQ tablet Take 1 tablet (10 mEq total) by mouth daily. 10/17/17   Hafsah Hendler A, PA-C  predniSONE (STERAPRED UNI-PAK 21 TAB) 10 MG (21) TBPK tablet Take by mouth daily. 6 tabsx2 days, then 5 tabsx2 days, then 4 tabsx2 days, then 3 tabsx2 days, 2 tabsx2 days, then 1 tab daily for  2 days Patient not taking: Reported on 10/17/2017 10/09/17   Liberty Handy, PA-C  sucralfate (CARAFATE) 1 GM/10ML suspension Take 10 mLs (1 g total) by mouth 4 (four) times daily -  with meals and at bedtime for 14 days. Patient not taking: Reported on 10/17/2017 09/12/17 09/26/17  Jeannie Fend, PA-C    Family History Family History  Problem Relation Age of Onset  . Hyperlipidemia Mother   . Hypertension Mother   . Atrial fibrillation Mother   . Hypertension Brother   . Hypertension Maternal Grandmother   . Cancer Maternal Grandmother     Social History Social History   Tobacco Use  . Smoking status: Former Smoker    Types: Cigarettes  . Smokeless tobacco: Never Used  Substance Use Topics  . Alcohol use: No    Alcohol/week: 0.0 standard drinks  . Drug use: No     Allergies   Patient has no known allergies.   Review of Systems Review of Systems  Constitutional: Negative for activity change, appetite change, chills, diaphoresis, fatigue and fever.  HENT: Negative for tinnitus.   Eyes: Negative for visual disturbance.  Respiratory: Negative for cough, chest tightness, shortness of breath, wheezing and stridor.   Cardiovascular: Negative for chest pain and palpitations.  Gastrointestinal: Positive for nausea. Negative for abdominal distention, abdominal pain, blood in stool, constipation, diarrhea and vomiting.  Musculoskeletal: Negative for back pain, neck pain and neck stiffness.  Skin: Negative.   Neurological: Negative for dizziness, tremors, seizures, syncope, facial asymmetry, speech difficulty, weakness, light-headedness, numbness and headaches.     Physical Exam Updated Vital Signs BP 118/79   Pulse 96   Temp 98.2 F (36.8 C) (Oral)   Resp (!) 22   SpO2 98%   Physical Exam  Physical Exam  Constitutional: Pt is oriented to person, place, and time. Pt appears well-developed and well-nourished. No distress.  HENT:  Head: Normocephalic and atraumatic.    Mouth/Throat: Oropharynx is clear and moist.  Eyes: Conjunctivae and EOM are normal. Pupils are equal, round, and reactive to light. No scleral icterus.  No horizontal, vertical or rotational nystagmus  Neck: Normal range of motion. Neck supple.  Full active and passive ROM without pain No midline or paraspinal tenderness No nuchal rigidity or meningeal signs  Cardiovascular: Normal rate, regular rhythm and intact distal pulses.   Pulmonary/Chest: Effort normal and breath sounds normal. No respiratory distress. Pt has no wheezes. No rales.  Abdominal: Soft. Bowel sounds are normal. There is no tenderness. There is no rebound and no guarding.  Musculoskeletal: Normal range of motion.  Lymphadenopathy:    No cervical adenopathy.  Neurological: Pt. is alert and oriented to person, place, and time. He has normal reflexes. No cranial nerve deficit.  Exhibits normal muscle tone. Coordination normal.  Mental Status:  Alert, oriented, thought content appropriate. Speech fluent without evidence of aphasia. Able to follow 2 step commands without difficulty.  Cranial Nerves:  II:  Peripheral visual fields grossly normal, pupils equal,  round, reactive to light III,IV, VI: ptosis not present, extra-ocular motions intact bilaterally  V,VII: smile symmetric, facial light touch sensation equal VIII: hearing grossly normal bilaterally  IX,X: midline uvula rise  XI: bilateral shoulder shrug equal and strong XII: midline tongue extension  Motor:  5/5 in upper and lower extremities bilaterally including strong and equal grip strength and dorsiflexion/plantar flexion Sensory: Pinprick and light touch normal in all extremities.  Deep Tendon Reflexes: 2+ and symmetric  Cerebellar: normal finger-to-nose with bilateral upper extremities Gait: normal gait and balance CV: distal pulses palpable throughout   Skin: Skin is warm and dry. No rash noted. Pt is not diaphoretic.  Psychiatric: Pt has a normal mood  and affect. Behavior is normal. Judgment and thought content normal.  Nursing note and vitals reviewed. ED Treatments / Results  Labs (all labs ordered are listed, but only abnormal results are displayed) Labs Reviewed  BASIC METABOLIC PANEL - Abnormal; Notable for the following components:      Result Value   Potassium 3.2 (*)    Glucose, Bld 152 (*)    BUN 27 (*)    Creatinine, Ser 1.94 (*)    GFR calc non Af Amer 41 (*)    GFR calc Af Amer 47 (*)    All other components within normal limits  CBC  CBG MONITORING, ED    EKG EKG Interpretation  Date/Time:  Friday October 17 2017 14:07:38 EDT Ventricular Rate:  134 PR Interval:  124 QRS Duration: 80 QT Interval:  316 QTC Calculation: 471 R Axis:   -23 Text Interpretation:  Sinus tachycardia Biatrial enlargement Left ventricular hypertrophy Nonspecific ST abnormality Abnormal ECG Since prior ECG, rate has increased Confirmed by Alvira Monday ([id number]) on 10/17/2017 4:47:04 PM   Radiology No results found.  Procedures Procedures (including critical care time)  Medications Ordered in ED Medications  ondansetron (ZOFRAN) injection 4 mg (4 mg Intravenous Given 10/17/17 1527)  sodium chloride 0.9 % bolus 1,000 mL (0 mLs Intravenous Stopped 10/17/17 1520)     Initial Impression / Assessment and Plan / ED Course  I have reviewed the triage vital signs and the nursing notes as well as past medical history.  Pertinent labs & imaging results that were available during my care of the patient were reviewed by me and considered in my medical decision making (see chart for details).  Patient presents initially for intermittent lightheadedness.  On this provider's initial exam patient denies any episodes of lightheadedness, dizziness or near syncope. Afebrile, nonseptic, non-ill-appearing. Patient states he only had an episode of nausea. Tachycardic in triage. EKG sinus tach with biatrial enlargement. HR 85 on my exam.  Was given  fluids and Zofran in triage.  On my exam his symptoms have improved.  No pain symptoms.  No neuro deficits.  No vertigo symptoms, no nystagmus. No exertional symptoms. Low suspicion for ACS, PE or AS. Hypokalemia on labs, per patient has not been taking his potassium supplementation, however still has his prescription at home. Discussed needing to resume his KDurr at home. Creatinine and BUN at baseline. Do not feel there is any life threatening condition presents at this time. Discussed strict return precautions. Patient voiced understanding and is agreeable to follow-up.   Clinical Course as of Oct 18 1702  Fri Oct 17, 2017  1611 Creatinine at baseline, Hypokalemia  Creatinine(!): 1.94 [BH]  1612 No leukocytosis  WBC: 10.0 [BH]    Clinical Course User Index [BH] Shantavia Jha A, PA-C  Final Clinical Impressions(s) / ED Diagnoses   Final diagnoses:  Nausea    ED Discharge Orders         Ordered    potassium chloride (K-DUR) 10 MEQ tablet  Daily     10/17/17 1647           Fonnie Crookshanks A, PA-C 10/17/17 1706    Alvira Monday, MD 10/18/17 1405

## 2017-10-28 ENCOUNTER — Other Ambulatory Visit: Payer: Self-pay

## 2017-10-28 ENCOUNTER — Emergency Department (HOSPITAL_COMMUNITY)
Admission: EM | Admit: 2017-10-28 | Discharge: 2017-10-28 | Disposition: A | Discharge status: Home / Self Care | Payer: Medicaid Other | Attending: Emergency Medicine | Admitting: Emergency Medicine

## 2017-10-28 ENCOUNTER — Encounter (HOSPITAL_COMMUNITY): Payer: Self-pay | Admitting: *Deleted

## 2017-10-28 DIAGNOSIS — Z87891 Personal history of nicotine dependence: Secondary | ICD-10-CM | POA: Diagnosis not present

## 2017-10-28 DIAGNOSIS — E1122 Type 2 diabetes mellitus with diabetic chronic kidney disease: Secondary | ICD-10-CM | POA: Diagnosis not present

## 2017-10-28 DIAGNOSIS — Z79899 Other long term (current) drug therapy: Secondary | ICD-10-CM | POA: Diagnosis not present

## 2017-10-28 DIAGNOSIS — N183 Chronic kidney disease, stage 3 (moderate): Secondary | ICD-10-CM | POA: Insufficient documentation

## 2017-10-28 DIAGNOSIS — I129 Hypertensive chronic kidney disease with stage 1 through stage 4 chronic kidney disease, or unspecified chronic kidney disease: Secondary | ICD-10-CM | POA: Insufficient documentation

## 2017-10-28 DIAGNOSIS — I10 Essential (primary) hypertension: Secondary | ICD-10-CM

## 2017-10-28 NOTE — ED Triage Notes (Signed)
Pt reports the [facility] has checked his BP and sometimes the BOP is high and then low. Pt reports he does not feel good. Pt wants meds.

## 2017-10-28 NOTE — ED Provider Notes (Signed)
[name] EMERGENCY DEPARTMENT Provider Note   CSN: [id number] Arrival date & time: 10/28/17  1122     History   Chief Complaint Chief Complaint  Patient presents with  . Hypertension    HPI Kyle Montgomery is a 42 y.o. male.  HPI   42 year old male presents today with complaints of hypertension.  Patient notes that he has been taking his medication both lisinopril and amlodipine as directed.  He notes he took his blood pressure medication this morning.  He had his blood pressure checked and noticed that the diastolic was 105, noting this was the highest it has been.  Patient denies any associated symptoms including chest pain shortness of breath, vision changes lower extremity swelling or edema.  Patient notes that he has been eating high salt diet recently.  Patient called his primary care and has scheduled appointment in 3 days.         Past Medical History:  Diagnosis Date  . Bipolar affective (HCC)    followed by mental health  . Gynecomastia 02/07   normal pituitary by MRI  . 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 Hypertension   . Renal insufficiency    baseline Cr 2.2 - renal US with increased Echo signal, no nephrology eval, no bx    Patient Active Problem List   Diagnosis Date Noted  . Type 2 diabetes, diet controlled (HCC) 04/15/2017  . Hip pain, left 04/09/2016  . Abnormal finding of diagnostic imaging 04/09/2016  . Sprain of ankle 02/26/2016  . Hyperlipidemia 09/06/2014  . Healthcare maintenance 12/01/2012  . CKD (chronic kidney disease), stage III (HCC) 09/01/2012  . Bipolar disorder (HCC) 11/06/2005  . Essential hypertension 11/06/2005    Past Surgical History:  Procedure Laterality Date  . DENTAL SURGERY          Home Medications    Prior to Admission medications   Medication Sig Start Date End Date Taking? Authorizing Provider  acetaminophen (TYLENOL) 325 MG tablet Take 2 tablets  (650 mg total) by mouth every 6 (six) hours as needed. 10/08/17   Khatri, Hina, PA-C  amLODipine (NORVASC) 10 MG tablet Take 1 tablet (10 mg total) by mouth daily. 11/12/16   Ginger Carne, MD  benzonatate (TESSALON) 100 MG capsule Take 1 capsule (100 mg total) by mouth every 8 (eight) hours. Patient not taking: Reported on 10/17/2017 09/03/17   Eyvonne Mechanic, PA-C  diclofenac sodium (VOLTAREN) 1 % GEL Apply 4 g topically 4 (four) times daily. Patient taking differently: Apply 4 g topically 4 (four) times daily as needed (pain).  02/27/17   Aviva Kluver B, PA-C  divalproex (DEPAKOTE ER) 500 MG 24 hr tablet Take 500 mg by mouth at bedtime.    [provider]  docusate sodium (COLACE) 100 MG capsule Take 1 capsule (100 mg total) by mouth 2 (two) times daily as needed for mild constipation. 08/28/17   Loren Racer, MD  lisinopril-hydrochlorothiazide (PRINZIDE,ZESTORETIC) 20-12.5 MG tablet Take 2 tablets by mouth daily. 11/12/16 11/12/17  Ginger Carne, MD  naproxen (NAPROSYN) 500 MG tablet Take 1 tablet (500 mg total) by mouth 2 (two) times daily. Patient not taking: Reported on 10/17/2017 10/09/17   Liberty Handy, PA-C  pantoprazole (PROTONIX) 20 MG tablet Take 1 tablet (20 mg total) by mouth daily. Patient not taking: Reported on 10/17/2017 08/24/17 09/23/17  Maia Plan, MD  polyethylene glycol [facility]) packet Take 17 g by mouth daily. Patient not taking:  Reported on 10/17/2017 08/24/17   Long, Arlyss Repress, MD  potassium chloride (K-DUR) 10 MEQ tablet Take 1 tablet (10 mEq total) by mouth daily. 10/17/17   Henderly, Britni A, PA-C  predniSONE (STERAPRED UNI-PAK 21 TAB) 10 MG (21) TBPK tablet Take by mouth daily. 6 tabsx2 days, then 5 tabsx2 days, then 4 tabsx2 days, then 3 tabsx2 days, 2 tabsx2 days, then 1 tab daily for 2 days Patient not taking: Reported on 10/17/2017 10/09/17   Liberty Handy, PA-C  sucralfate (CARAFATE) 1 GM/10ML suspension Take 10 mLs (1 g total) by mouth 4 (four) times  daily -  with meals and at bedtime for 14 days. Patient not taking: Reported on 10/17/2017 09/12/17 09/26/17  Army Melia A, PA-C  ziprasidone (GEODON) 80 MG capsule Take 80 mg by mouth 2 (two) times daily with a meal.    [provider]    Family History Family History  Problem Relation Age of Onset  . Hyperlipidemia Mother   . Hypertension Mother   . Atrial fibrillation Mother   . Hypertension Brother   . Hypertension Maternal Grandmother   . Cancer Maternal Grandmother     Social History Social History   Tobacco Use  . Smoking status: Former Smoker    Types: Cigarettes  . Smokeless tobacco: Never Used  Substance Use Topics  . Alcohol use: No    Alcohol/week: 0.0 standard drinks  . Drug use: No     Allergies   Patient has no known allergies.   Review of Systems Review of Systems  All other systems reviewed and are negative.    Physical Exam Updated Vital Signs BP (!) 157/101 (BP Location: Right Arm)   Pulse 92   Temp 98.4 F (36.9 C) (Oral)   Resp 18   Ht 5\' 10"  (1.778 m)   Wt 79.4 kg   SpO2 97%   BMI 25.11 kg/m   Physical Exam  Constitutional: He is oriented to person, place, and time. He appears well-developed and well-nourished.  HENT:  Head: Normocephalic and atraumatic.  Eyes: Pupils are equal, round, and reactive to light. Conjunctivae are normal. Right eye exhibits no discharge. Left eye exhibits no discharge. No scleral icterus.  Neck: Normal range of motion. No JVD present. No tracheal deviation present.  Cardiovascular: Normal rate, regular rhythm, normal heart sounds and intact distal pulses. Exam reveals no gallop and no friction rub.  No murmur heard. Pulmonary/Chest: Effort normal and breath sounds normal. No stridor. No respiratory distress. He has no wheezes. He has no rales. He exhibits no tenderness.  Musculoskeletal:  No lower extremity edema  Neurological: He is alert and oriented to person, place, and time. Coordination  normal. GCS eye subscore is 4. GCS verbal subscore is 5. GCS motor subscore is 6.  Psychiatric: He has a normal mood and affect. His behavior is normal. Judgment and thought content normal.  Nursing note and vitals reviewed.    ED Treatments / Results  Labs (all labs ordered are listed, but only abnormal results are displayed) Labs Reviewed - No data to display  EKG None  Radiology No results found.  Procedures Procedures (including critical care time)  Medications Ordered in ED Medications - No data to display   Initial Impression / Assessment and Plan / ED Course  I have reviewed the triage vital signs and the nursing notes.  Pertinent labs & imaging results that were available during my care of the patient were reviewed by me and considered in  my medical decision making (see chart for details).      42 year old male presents today with asymptomatic hypertension.  Only minimally elevated here, discharged with outpatient follow-up and return precautions.  Verbalized understanding and agreement to today's plan. Final Clinical Impressions(s) / ED Diagnoses   Final diagnoses:  Hypertension, unspecified type    ED Discharge Orders    None       Eyvonne Mechanic, PA-C 10/28/17 1316    Doug Sou, MD 10/28/17 2249

## 2017-10-28 NOTE — ED Notes (Signed)
Patient verbalizes understanding of discharge instructions. Opportunity for questioning and answers were provided. Pt discharged from ED. 

## 2017-10-28 NOTE — Discharge Instructions (Addendum)
Please read attached information. If you experience any new or worsening signs or symptoms please return to the emergency room for evaluation. Please follow-up with your primary care provider or specialist as discussed. Please continue using previously prescribed medication.    °

## 2017-10-31 ENCOUNTER — Ambulatory Visit: Payer: Self-pay

## 2017-11-04 ENCOUNTER — Other Ambulatory Visit: Payer: Self-pay | Admitting: Internal Medicine

## 2017-11-04 ENCOUNTER — Other Ambulatory Visit: Payer: Self-pay

## 2017-11-04 MED ORDER — LISINOPRIL-HYDROCHLOROTHIAZIDE 20-12.5 MG PO TABS: 2.0000 | ORAL_TABLET | Freq: Every day | ORAL | 3 refills | Status: DC

## 2017-11-04 NOTE — Telephone Encounter (Signed)
amLODipine (NORVASC) 10 MG tablet,  lisinopril-hydrochlorothiazide (PRINZIDE,ZESTORETIC) 20-12.5 MG tablet, Refill request @  [facility] [phone] - [place], [place] - [address] [phone] (Phone) [phone] (Fax)

## 2017-11-05 NOTE — Telephone Encounter (Signed)
Medications were refilled yesterday.

## 2017-11-12 ENCOUNTER — Telehealth: Payer: Self-pay | Admitting: Internal Medicine

## 2017-11-12 NOTE — Telephone Encounter (Signed)
Needs needs a refill on his gout and pain medicine @ [facility] on [address] ;pt contact [phone]

## 2017-11-13 ENCOUNTER — Other Ambulatory Visit: Payer: Self-pay | Admitting: Internal Medicine

## 2017-11-13 NOTE — Telephone Encounter (Signed)
Returned call to patient. States his ankle is feeling better. Now requesting refill on Geodon. This med is pending in refill encounter. Moved patient's appt from December to 11/25/2017 with PCP. Kinnie Feil, RN, BSN

## 2017-11-13 NOTE — Telephone Encounter (Signed)
From what I can tell from his recent ED visit, he had ankle pain and was prescribed short course of prednisone but he has no history of gout and ED provider wrote that he suspect ankle sprain rather than gout attack. FYI patient has not been taking his bipolar medication and has cognitive deficits per my conversation with his mother. He may mistakenly believe he has gout. I would not prescribe him any gout or pain medication until he is evaluated in clinic in person.

## 2017-11-13 NOTE — Telephone Encounter (Signed)
Thank you so much. Geodon refilled

## 2017-11-25 ENCOUNTER — Encounter: Payer: Self-pay | Admitting: Internal Medicine

## 2017-11-25 ENCOUNTER — Ambulatory Visit (INDEPENDENT_AMBULATORY_CARE_PROVIDER_SITE_OTHER): Payer: Medicaid Other | Admitting: Internal Medicine

## 2017-11-25 ENCOUNTER — Other Ambulatory Visit: Payer: Self-pay

## 2017-11-25 VITALS — BP 122/85 | HR 107 | Temp 98.2°F | Ht 70.0 in | Wt 199.2 lb

## 2017-11-25 DIAGNOSIS — R6250 Unspecified lack of expected normal physiological development in childhood: Secondary | ICD-10-CM

## 2017-11-25 DIAGNOSIS — Z0000 Encounter for general adult medical examination without abnormal findings: Secondary | ICD-10-CM

## 2017-11-25 DIAGNOSIS — M25571 Pain in right ankle and joints of right foot: Secondary | ICD-10-CM | POA: Diagnosis not present

## 2017-11-25 DIAGNOSIS — I10 Essential (primary) hypertension: Secondary | ICD-10-CM

## 2017-11-25 DIAGNOSIS — F3170 Bipolar disorder, currently in remission, most recent episode unspecified: Secondary | ICD-10-CM

## 2017-11-25 DIAGNOSIS — Z79899 Other long term (current) drug therapy: Secondary | ICD-10-CM

## 2017-11-25 DIAGNOSIS — F319 Bipolar disorder, unspecified: Secondary | ICD-10-CM

## 2017-11-25 DIAGNOSIS — M25572 Pain in left ankle and joints of left foot: Secondary | ICD-10-CM

## 2017-11-25 DIAGNOSIS — N183 Chronic kidney disease, stage 3 unspecified: Secondary | ICD-10-CM

## 2017-11-25 DIAGNOSIS — I129 Hypertensive chronic kidney disease with stage 1 through stage 4 chronic kidney disease, or unspecified chronic kidney disease: Secondary | ICD-10-CM

## 2017-11-25 MED ORDER — ACETAMINOPHEN 325 MG PO TABS: 650.0000 mg | ORAL_TABLET | Freq: Four times a day (QID) | ORAL | 0 refills | Status: DC | PRN

## 2017-11-25 MED ORDER — AMLODIPINE BESYLATE 10 MG PO TABS: 10.0000 mg | ORAL_TABLET | Freq: Every day | ORAL | 2 refills | Status: DC

## 2017-11-25 MED ORDER — LISINOPRIL-HYDROCHLOROTHIAZIDE 20-12.5 MG PO TABS: 2.0000 | ORAL_TABLET | Freq: Every day | ORAL | 2 refills | Status: DC

## 2017-11-25 NOTE — Patient Instructions (Signed)
Kyle Montgomery,  Thank you so much for visiting the clinic. Here are our recommendations based on what we discussed.  Please continue to take your medications as prescribed We will call you and let you know if any of your blood tests are abnormal Please feel free to call for advice if you have any significant symptoms before going to the emergency department  Thank you and hope you have a good day

## 2017-11-26 LAB — BMP8+ANION GAP
Anion Gap: 20 mmol/L — ABNORMAL HIGH (ref 10.0–18.0)
BUN / CREAT RATIO: 14 (ref 9–20)
BUN: 27 mg/dL — ABNORMAL HIGH (ref 6–24)
CALCIUM: 10.2 mg/dL (ref 8.7–10.2)
CO2: 21 mmol/L (ref 20–29)
Chloride: 105 mmol/L (ref 96–106)
Creatinine, Ser: 1.98 mg/dL — ABNORMAL HIGH (ref 0.76–1.27)
GFR calc Af Amer: 47 mL/min/{1.73_m2} — ABNORMAL LOW (ref >59)
GFR, EST NON AFRICAN AMERICAN: 40 mL/min/{1.73_m2} — AB (ref >59)
Glucose: 100 mg/dL — ABNORMAL HIGH (ref 65–99)
Potassium: 3.7 mmol/L (ref 3.5–5.2)
Sodium: 146 mmol/L — ABNORMAL HIGH (ref 134–144)

## 2017-11-26 LAB — LIPID PANEL
Chol/HDL Ratio: 5.9 ratio — ABNORMAL HIGH (ref 0.0–5.0)
Cholesterol, Total: 236 mg/dL — ABNORMAL HIGH (ref 100–199)
HDL: 40 mg/dL (ref >39)
LDL Calculated: 156 mg/dL — ABNORMAL HIGH (ref 0–99)
Triglycerides: 200 mg/dL — ABNORMAL HIGH (ref 0–149)
VLDL Cholesterol Cal: 40 mg/dL (ref 5–40)

## 2017-11-27 ENCOUNTER — Encounter: Payer: Self-pay | Admitting: Internal Medicine

## 2017-11-27 DIAGNOSIS — N289 Disorder of kidney and ureter, unspecified: Secondary | ICD-10-CM | POA: Insufficient documentation

## 2017-11-27 NOTE — Progress Notes (Signed)
CC: Hypertension  HPI: Kyle Montgomery is a 42 y.o. w/ PMH of HTN, CKD3b, bipolar disorder, and developmental delays presenting for management of his chronic conditions. Kyle Montgomery has been having multiple inappropriate visits to the ED during the summer when he was not taking his psych meds but have started taking them last month and has not had any more such visits. He states he was going to the ED due to ankle pain but he was given ankle braces and ever since his pain had been managed with Tylenol and he no longer felt the need to visit the ED although chart review shows he also had multiple visits for other complaints. He states for the last month he felt okay on his psych meds and he came to the clinic because he needed refills on his blood pressure medications. Endorsing no other complaints.  Past Medical History:  Diagnosis Date  . Bipolar affective (HCC)    followed by mental health  . Gynecomastia 02/07   normal pituitary by MRI  . 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 Hypertension   . Renal insufficiency    baseline Cr 2.2 - renal US with increased Echo signal, no nephrology eval, no bx   Review of Systems: Review of Systems  Constitutional: Negative for chills, fever and malaise/fatigue.  Respiratory: Negative for cough, sputum production and shortness of breath.   Cardiovascular: Negative for chest pain, palpitations and leg swelling.  Gastrointestinal: Negative for constipation, diarrhea, nausea and vomiting.  Genitourinary: Negative for dysuria, frequency and urgency.  Musculoskeletal: Positive for joint pain (bilateral ankle pains).  Neurological: Negative for dizziness, focal weakness and headaches.  Psychiatric/Behavioral: Negative for depression, hallucinations and suicidal ideas. The patient is not nervous/anxious and does not have insomnia.     Physical Exam: Vitals:   11/25/17 1343  BP: 122/85  Pulse: (!) 107  Temp: 98.2 F  (36.8 C)  TempSrc: Oral  SpO2: 98%  Weight: 199 lb 3.2 oz (90.4 kg)  Height: 5\' 10"  (1.778 m)   Physical Exam  Constitutional: He is oriented to person, place, and time. He appears well-developed and well-nourished. No distress.  Eyes: Pupils are equal, round, and reactive to light. Conjunctivae and EOM are normal. No scleral icterus.  Neck: Normal range of motion. Neck supple.  Cardiovascular: Normal rate, regular rhythm, normal heart sounds and intact distal pulses.  No murmur heard. Respiratory: Effort normal and breath sounds normal. No respiratory distress. He has no rales.  GI: Soft. Bowel sounds are normal. He exhibits no distension. There is no tenderness. There is no guarding.  Musculoskeletal: Normal range of motion. He exhibits no edema.  Neurological: He is alert and oriented to person, place, and time. He has normal reflexes. No cranial nerve deficit.  Skin: Skin is warm and dry.  Psychiatric: His behavior is normal. Thought content normal.  Flat affect    Assessment & Plan:   CKD (chronic kidney disease), stage III Presents with CKD 3b 2/2 hx of lithium use. Baseline creatinine ~1.9 with GFR or 45-50. Does not appear fluid overloaded on exam. Endorsing good urine output. Currently on lisinopril-HCTZ for blood controll. Currently taking no other nephrotoxic medications.  - BMP today to monitor renal function  Bipolar affective (HCC) Currently on ziprasidone and depakote. Prior history of lithium use. Denies any headaches, manic/hypomanic symptoms, depressed mood, suicidal ideation, pressured speech, sleep abnormalities, appetite changes. Although ziprasidone is an antipsychotic without weight gain as a side effect,  he has had 25 pound weight gain since October. Unclear how accurate prior weight recorded during ED visit is. Currently denies any extrapyramidal symptoms. Current mood stable.    - C/w ziprasidone 80mg  daily, depakote 500mg  qhs - Monitor for side effects -  Lipid panel today  Essential hypertension BP Readings from Last 3 Encounters:  11/25/17 122/85  10/28/17 (!) 157/101  10/17/17 118/79   Current bp at goal. On amlodipine and lisinopril-HCTZ. Denies any headache, blurry vision, cough, rash - C/w current regimen: amlodipine 10mg  daily, lisinopril-HCTZ 50-25mg  daily  Healthcare maintenance Patient declined pneumonia shot as well as HIV screening    Patient seen with Dr. Josem Kaufmann   -Judeth Cornfield, PGY1

## 2017-11-27 NOTE — Assessment & Plan Note (Signed)
Patient declined pneumonia shot as well as HIV screening

## 2017-11-27 NOTE — Assessment & Plan Note (Signed)
Presents with CKD 3b 2/2 hx of lithium use. Baseline creatinine ~1.9 with GFR or 45-50. Does not appear fluid overloaded on exam. Endorsing good urine output. Currently on lisinopril-HCTZ for blood controll. Currently taking no other nephrotoxic medications.  - BMP today to monitor renal function

## 2017-11-27 NOTE — Assessment & Plan Note (Addendum)
BP Readings from Last 3 Encounters:  11/25/17 122/85  10/28/17 (!) 157/101  10/17/17 118/79   Current bp at goal. On amlodipine and lisinopril-HCTZ. Denies any headache, blurry vision, cough, rash - C/w current regimen: amlodipine 10mg  daily, lisinopril-HCTZ 50-25mg  daily

## 2017-11-27 NOTE — Assessment & Plan Note (Signed)
Currently on ziprasidone and depakote. Prior history of lithium use. Denies any headaches, manic/hypomanic symptoms, depressed mood, suicidal ideation, pressured speech, sleep abnormalities, appetite changes. Although ziprasidone is an antipsychotic without weight gain as a side effect, he has had 25 pound weight gain since October. Unclear how accurate prior weight recorded during ED visit is. Currently denies any extrapyramidal symptoms. Current mood stable.    - C/w ziprasidone 80mg  daily, depakote 500mg  qhs - Monitor for side effects - Lipid panel today

## 2017-11-28 NOTE — Addendum Note (Signed)
Addended by: Doneen Poisson D on: 11/28/2017 08:15 PM   Modules accepted: Level of Service

## 2017-11-28 NOTE — Progress Notes (Signed)
Patient ID: Kyle Montgomery, male   DOB: 1975/11/24, 42 y.o.   MRN: [id number]  I saw and evaluated the patient.  I personally confirmed the key portions of Dr. Marigene Ehlers history and exam and reviewed pertinent patient test results.  The assessment, diagnosis, and plan were formulated together and I agree with the documentation in the resident's note.  Given results of lipid panel will start high intensity statin atorvastatin 40 mg PO QD.

## 2017-12-01 ENCOUNTER — Telehealth: Payer: Self-pay | Admitting: Internal Medicine

## 2017-12-01 DIAGNOSIS — E7849 Other hyperlipidemia: Secondary | ICD-10-CM

## 2017-12-01 DIAGNOSIS — I10 Essential (primary) hypertension: Secondary | ICD-10-CM

## 2017-12-01 DIAGNOSIS — F3170 Bipolar disorder, currently in remission, most recent episode unspecified: Secondary | ICD-10-CM

## 2017-12-01 MED ORDER — DIVALPROEX SODIUM ER 500 MG PO TB24: 500.0000 mg | ORAL_TABLET | Freq: Every day | ORAL | 1 refills | Status: DC

## 2017-12-01 MED ORDER — ZIPRASIDONE HCL 80 MG PO CAPS: 80.0000 mg | ORAL_CAPSULE | Freq: Two times a day (BID) | ORAL | 1 refills | Status: DC

## 2017-12-01 MED ORDER — ATORVASTATIN CALCIUM 40 MG PO TABS: 40.0000 mg | ORAL_TABLET | Freq: Every day | ORAL | 1 refills | Status: DC

## 2017-12-01 NOTE — Telephone Encounter (Signed)
Discussed with patient on his mobile phone about abnormal lab results including his lipid panel. He stated he would be willing to try lipid lowering therapy as well as lifestyle modifications. Informed patient about possible side effects including myositis. Patient expressed understanding. Kyle Montgomery also requested 6 month supply for his bipolar medication. Will send refill as requested.

## 2017-12-30 ENCOUNTER — Encounter: Payer: Self-pay | Admitting: Internal Medicine

## 2018-02-16 ENCOUNTER — Emergency Department (HOSPITAL_COMMUNITY): Payer: Medicaid Other

## 2018-02-16 ENCOUNTER — Other Ambulatory Visit: Payer: Self-pay

## 2018-02-16 ENCOUNTER — Encounter (HOSPITAL_COMMUNITY): Payer: Self-pay | Admitting: *Deleted

## 2018-02-16 ENCOUNTER — Emergency Department (HOSPITAL_COMMUNITY)
Admission: EM | Admit: 2018-02-16 | Discharge: 2018-02-16 | Disposition: A | Discharge status: Home / Self Care | Payer: Medicaid Other | Attending: Emergency Medicine | Admitting: Emergency Medicine

## 2018-02-16 DIAGNOSIS — Z79899 Other long term (current) drug therapy: Secondary | ICD-10-CM | POA: Diagnosis not present

## 2018-02-16 DIAGNOSIS — Z87891 Personal history of nicotine dependence: Secondary | ICD-10-CM | POA: Insufficient documentation

## 2018-02-16 DIAGNOSIS — N183 Chronic kidney disease, stage 3 (moderate): Secondary | ICD-10-CM | POA: Diagnosis not present

## 2018-02-16 DIAGNOSIS — I129 Hypertensive chronic kidney disease with stage 1 through stage 4 chronic kidney disease, or unspecified chronic kidney disease: Secondary | ICD-10-CM | POA: Insufficient documentation

## 2018-02-16 DIAGNOSIS — M25572 Pain in left ankle and joints of left foot: Secondary | ICD-10-CM | POA: Diagnosis present

## 2018-02-16 MED ORDER — PREDNISONE 20 MG PO TABS
60.0000 mg | ORAL_TABLET | Freq: Once | ORAL | Status: AC
  Administered 2018-02-16: 60 mg via ORAL
  Filled 2018-02-16: qty 3

## 2018-02-16 MED ORDER — NAPROXEN 250 MG PO TABS
500.0000 mg | ORAL_TABLET | Freq: Once | ORAL | Status: AC
  Administered 2018-02-16: 500 mg via ORAL
  Filled 2018-02-16: qty 2

## 2018-02-16 MED ORDER — PREDNISONE 10 MG (21) PO TBPK: ORAL_TABLET | Freq: Every day | ORAL | 0 refills | Status: DC

## 2018-02-16 NOTE — ED Notes (Signed)
Patient transported to X-ray 

## 2018-02-16 NOTE — Discharge Instructions (Signed)
Please take steroid taper as directed, in addition to this you may take Tylenol for pain and use the brace provided for you today in your crutches that you have at home, you can also use ice and elevation.  If ankle pain is not improving please follow-up with your primary care doctor for reevaluation.  Return to the emergency department if the ankle becomes red hot or swollen to the touch you are not able to move it due to severe and worsened pain you have numbness or tingling in the foot or any other new or concerning symptoms.

## 2018-02-16 NOTE — ED Notes (Signed)
Patient verbalizes understanding of discharge instructions. Opportunity for questioning and answers were provided. Armband removed by staff, pt discharged from ED.  

## 2018-02-16 NOTE — ED Notes (Signed)
Pt reports that he has had intermittent left ankle pain and swelling for the past couple of weeks. He was told by his MD to come back for eval if he experienced a flare up. He has difficulty and pain w/ walking short distances.

## 2018-02-16 NOTE — ED Triage Notes (Signed)
Pt in c/o left ankle pain, states he has a history of gout and thinks he is having a flare

## 2018-02-16 NOTE — ED Provider Notes (Signed)
[name] Provider Note   CSN: [id number] Arrival date & time: 02/16/18  1425     History   Chief Complaint Chief Complaint  Patient presents with  . Ankle Pain    HPI Kyle CaiJohn L Montgomery is a 43 y.o. male.  Kyle Montgomery is a 43 y.o. male with a history of gout, hypertension and renal insufficiency, as well as bipolar affective disorder, who presents to the emergency department for evaluation of pain in his left ankle.  Patient reports he has had multiple gout flares in the left ankle in the past which they typically treat with steroids.  He reports that he has had some intermittent pains in the left ankle over the last few weeks but this pain has worsened and become more frequent over the past 3 days.  He denies any trauma or injury to the ankle but does report that he is up on his feet walking long distances on a daily basis and wonder if this could contribute.  He has noticed some intermittent swelling in the left ankle has not noticed any redness or warmth.  No fevers or chills.  Similar pain in the past that has improved with gout treatment.  He reports he has crutches at home but has not been using them, has used an ankle brace in the past but no longer has this available to him.  No associated chest pain or shortness of breath.  Has not followed up with his PCP regarding the symptoms.     Past Medical History:  Diagnosis Date  . Bipolar affective (HCC)    followed by mental health  . Gynecomastia 02/07   normal pituitary by MRI  . 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 Hypertension   . Renal insufficiency    baseline Cr 2.2 - renal US with increased Echo signal, no nephrology eval, no bx    Patient Active Problem List   Diagnosis Date Noted  . Hyperlipidemia 09/06/2014  . Healthcare maintenance 12/01/2012  . CKD (chronic kidney disease), stage III (HCC) 09/01/2012  . Bipolar affective (HCC)  11/06/2005  . Essential hypertension 11/06/2005  . Gynecomastia 11/06/2005    Past Surgical History:  Procedure Laterality Date  . DENTAL SURGERY          Home Medications    Prior to Admission medications   Medication Sig Start Date End Date Taking? Authorizing Provider  acetaminophen (TYLENOL) 325 MG tablet Take 2 tablets (650 mg total) by mouth every 6 (six) hours as needed. 11/25/17   Theotis BarrioLee, Joshua K, MD  amLODipine (NORVASC) 10 MG tablet Take 1 tablet (10 mg total) by mouth daily. 11/25/17   Theotis BarrioLee, Joshua K, MD  atorvastatin (LIPITOR) 40 MG tablet Take 1 tablet (40 mg total) by mouth daily. 12/01/17   Theotis BarrioLee, Joshua K, MD  diclofenac sodium (VOLTAREN) 1 % GEL Apply 4 g topically 4 (four) times daily. Patient taking differently: Apply 4 g topically 4 (four) times daily as needed (pain).  02/27/17   Aviva KluverMurray, Alyssa B, PA-C  divalproex (DEPAKOTE ER) 500 MG 24 hr tablet Take 1 tablet (500 mg total) by mouth at bedtime. 12/01/17   Theotis BarrioLee, Joshua K, MD  lisinopril-hydrochlorothiazide (PRINZIDE,ZESTORETIC) 20-12.5 MG tablet Take 2 tablets by mouth daily. 11/25/17 11/25/18  Theotis BarrioLee, Joshua K, MD  polyethylene glycol [facility](MIRALAX) packet Take 17 g by mouth daily. Patient not taking: Reported on 10/17/2017 08/24/17   Long, Arlyss RepressJoshua G, MD  predniSONE (  STERAPRED UNI-PAK 21 TAB) 10 MG (21) TBPK tablet Take by mouth daily. Take 6 tabs by mouth daily  for 2 days, then 5 tabs for 2 days, then 4 tabs for 2 days, then 3 tabs for 2 days, 2 tabs for 2 days, then 1 tab by mouth daily for 2 days 02/16/18   Dartha Lodge, PA-C  ziprasidone (GEODON) 80 MG capsule Take 1 capsule (80 mg total) by mouth 2 (two) times daily. 12/01/17   Theotis Barrio, MD    Family History Family History  Problem Relation Age of Onset  . Hyperlipidemia Mother   . Hypertension Mother   . Atrial fibrillation Mother   . Hypertension Brother   . Hypertension Maternal Grandmother   . Cancer Maternal Grandmother     Social History Social History    Tobacco Use  . Smoking status: Former Smoker    Types: Cigarettes    Last attempt to quit: 01/22/1983    Years since quitting: 35.0  . Smokeless tobacco: Never Used  Substance Use Topics  . Alcohol use: No    Alcohol/week: 0.0 standard drinks  . Drug use: No     Allergies   Patient has no known allergies.   Review of Systems Review of Systems  Constitutional: Negative for chills and fever.  Musculoskeletal: Positive for arthralgias and joint swelling.  Skin: Negative for color change, rash and wound.  Neurological: Negative for weakness and numbness.     Physical Exam Updated Vital Signs BP (!) 146/106 (BP Location: Right Arm)   Pulse 96   Temp 98.4 F (36.9 C) (Oral)   Resp 16   Ht 5\' 10"  (1.778 m)   Wt 79.4 kg   SpO2 94%   BMI 25.11 kg/m   Physical Exam Vitals signs and nursing note reviewed.  Constitutional:      General: He is not in acute distress.    Appearance: Normal appearance. He is well-developed and normal weight. He is not ill-appearing or diaphoretic.  HENT:     Head: Normocephalic and atraumatic.  Eyes:     General:        Right eye: No discharge.        Left eye: No discharge.  Pulmonary:     Effort: Pulmonary effort is normal. No respiratory distress.  Musculoskeletal:     Comments: Tenderness to palpation over the lateral aspect of the left ankle with small amount of swelling noted, no overlying erythema or warmth, patient is able to dorsi and plantarflex the ankle with minimal pain.  2+ DP and TP pulses in the foot is warm and well perfused with normal coloration, normal sensation and 5/5 strength.  Skin:    General: Skin is warm and dry.  Neurological:     Mental Status: He is alert.     Coordination: Coordination normal.  Psychiatric:        Mood and Affect: Mood normal.        Behavior: Behavior normal.      ED Treatments / Results  Labs (all labs ordered are listed, but only abnormal results are displayed) Labs Reviewed -  No data to display  EKG None  Radiology Dg Ankle Complete Left  Result Date: 02/16/2018 CLINICAL DATA:  43 y/o M; left ankle pain for 3 days. History of gout. EXAM: LEFT ANKLE COMPLETE - 3+ VIEW COMPARISON:  10/08/2017 left lower extremity radiograph. FINDINGS: There is no evidence of fracture, dislocation, or joint effusion. Talar dome is intact.  Ankle mortise is symmetric on these nonstress views. Dorsal calcaneal enthesophytes. IMPRESSION: Negative. Electronically Signed   By: Mitzi Hansen M.D.   On: 02/16/2018 18:14    Procedures Procedures (including critical care time)  Medications Ordered in ED Medications  naproxen (NAPROSYN) tablet 500 mg (500 mg Oral Given 02/16/18 1854)  predniSONE (DELTASONE) tablet 60 mg (60 mg Oral Given 02/16/18 1854)     Initial Impression / Assessment and Plan / ED Course  I have reviewed the triage vital signs and the nursing notes.  Pertinent labs & imaging results that were available during my care of the patient were reviewed by me and considered in my medical decision making (see chart for details).  43 year old male with known history of gout presents for evaluation of left ankle pain, reports he has had pain in the ankle intermittently over the past 2 weeks but it is been worse over the past 3 days.  He has had some intermittent swelling over the lateral aspect of the ankle.  He denies any inciting injury or trauma but does report he is up on his feet walking long distances on a daily basis and wonders if this could be contributing.  He has not noticed any redness or warmth and has not had any fevers or chills.  He denies any numbness tingling or weakness in the ankle or foot.  X-ray shows no acute fracture or abnormality in the foot is neurovascularly intact on exam and is warm and well perfused with 2+ DP and TP pulses.  Patient reports this feels very consistent with his prior gout, will treat with steroids and have patient take Tylenol  as needed at home.  Strict return precautions discussed with the patient we will have him follow-up with his PCP if not improving.  Patient expresses understanding and agreement with plan.  Discharged home in good condition.  Final Clinical Impressions(s) / ED Diagnoses   Final diagnoses:  Acute left ankle pain    ED Discharge Orders         Ordered    predniSONE (STERAPRED UNI-PAK 21 TAB) 10 MG (21) TBPK tablet  Daily     02/16/18 1843           Dartha Lodge, PA-C 02/16/18 1950    Melene Plan, DO 02/17/18 1502

## 2018-04-07 ENCOUNTER — Telehealth: Payer: Self-pay | Admitting: Internal Medicine

## 2018-04-07 ENCOUNTER — Encounter: Payer: Self-pay | Admitting: Internal Medicine

## 2018-04-07 DIAGNOSIS — E7849 Other hyperlipidemia: Secondary | ICD-10-CM

## 2018-04-07 DIAGNOSIS — F3170 Bipolar disorder, currently in remission, most recent episode unspecified: Secondary | ICD-10-CM

## 2018-04-07 DIAGNOSIS — I10 Essential (primary) hypertension: Secondary | ICD-10-CM

## 2018-04-07 MED ORDER — AMLODIPINE BESYLATE 10 MG PO TABS: 10.0000 mg | ORAL_TABLET | Freq: Every day | ORAL | 1 refills | Status: DC

## 2018-04-07 MED ORDER — ZIPRASIDONE HCL 80 MG PO CAPS: 80.0000 mg | ORAL_CAPSULE | Freq: Two times a day (BID) | ORAL | 1 refills | Status: DC

## 2018-04-07 MED ORDER — LISINOPRIL-HYDROCHLOROTHIAZIDE 20-12.5 MG PO TABS: 2.0000 | ORAL_TABLET | Freq: Every day | ORAL | 3 refills | Status: DC

## 2018-04-07 MED ORDER — DIVALPROEX SODIUM ER 500 MG PO TB24: 500.0000 mg | ORAL_TABLET | Freq: Every day | ORAL | 1 refills | Status: DC

## 2018-04-07 MED ORDER — ATORVASTATIN CALCIUM 40 MG PO TABS: 40.0000 mg | ORAL_TABLET | Freq: Every day | ORAL | 1 refills | Status: DC

## 2018-04-07 NOTE — Telephone Encounter (Signed)
Discussed with patient about sending refills to his [facility] pharmacy. He states he has no complaints at this time and just requires additional medication refills. Also discussed with his mother about his mood. She states significant improvement now that he is taking his bipolar medications. She also has no complaints at this time about his behavior and mood.

## 2018-06-09 ENCOUNTER — Encounter: Payer: Self-pay | Admitting: Internal Medicine

## 2018-06-09 ENCOUNTER — Ambulatory Visit: Payer: Medicaid Other | Admitting: Internal Medicine

## 2018-06-09 ENCOUNTER — Other Ambulatory Visit: Payer: Self-pay

## 2018-06-09 VITALS — BP 128/88 | HR 96 | Temp 98.3°F | Wt 227.1 lb

## 2018-06-09 DIAGNOSIS — F319 Bipolar disorder, unspecified: Secondary | ICD-10-CM | POA: Diagnosis not present

## 2018-06-09 DIAGNOSIS — Z0000 Encounter for general adult medical examination without abnormal findings: Secondary | ICD-10-CM | POA: Diagnosis not present

## 2018-06-09 DIAGNOSIS — I129 Hypertensive chronic kidney disease with stage 1 through stage 4 chronic kidney disease, or unspecified chronic kidney disease: Secondary | ICD-10-CM

## 2018-06-09 DIAGNOSIS — Z79899 Other long term (current) drug therapy: Secondary | ICD-10-CM

## 2018-06-09 DIAGNOSIS — I10 Essential (primary) hypertension: Secondary | ICD-10-CM

## 2018-06-09 DIAGNOSIS — N183 Chronic kidney disease, stage 3 unspecified: Secondary | ICD-10-CM

## 2018-06-09 LAB — POCT GLYCOSYLATED HEMOGLOBIN (HGB A1C): Hemoglobin A1C: 6.7 % — AB (ref 4.0–5.6)

## 2018-06-09 LAB — GLUCOSE, CAPILLARY: Glucose-Capillary: 130 mg/dL — ABNORMAL HIGH (ref 70–99)

## 2018-06-09 NOTE — Progress Notes (Signed)
   CC: HTN  HPI: Kyle Montgomery is a 43 y.o. M w/ PMH of bipolar disorder and HTN presenting to clinic for management of his chronic conditions. He states he is taking all of his medications w/o difficulty. He mentions some difficulty sticking to his low salt diet and getting enough exercise due to his ankle pains but he has been wearing his ankle brace with tolerable pain. Denies any chest pain, dyspnea, palpitations. He mentions that his mood has been well controlled on Depakote and Ziprasidone and denies any significant side effects.  Past Medical History:  Diagnosis Date  . Bipolar affective (HCC)    followed by mental health  . Gynecomastia 02/07   normal pituitary by MRI  . 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 Hypertension   . Renal insufficiency    baseline Cr 2.2 - renal US with increased Echo signal, no nephrology eval, no bx   Review of Systems: Review of Systems  Constitutional: Negative for chills, fever and malaise/fatigue.  Eyes: Negative for blurred vision.  Respiratory: Negative for shortness of breath.   Cardiovascular: Negative for chest pain and palpitations.  Gastrointestinal: Negative for constipation, diarrhea, nausea and vomiting.  Genitourinary: Negative for frequency.  Neurological: Negative for headaches.     Physical Exam: Vitals:   06/09/18 1526 06/09/18 1539  BP: (!) 143/96 128/88  Pulse: 96   Temp: 98.3 F (36.8 C)   TempSrc: Oral   SpO2: 96%   Weight: 227 lb 1.6 oz (103 kg)     Physical Exam  Constitutional: He appears well-developed and well-nourished. No distress.  HENT:  Mouth/Throat: Oropharynx is clear and moist.  Eyes: Conjunctivae are normal.  Neck: Normal range of motion. Neck supple.  Cardiovascular: Normal rate, regular rhythm, normal heart sounds and intact distal pulses.  No murmur heard. Respiratory: Effort normal and breath sounds normal. He has no wheezes. He has no rales.  GI: Soft.  Bowel sounds are normal. There is no abdominal tenderness.  Musculoskeletal: Normal range of motion.        General: No edema.  Skin: Skin is warm and dry.  Psychiatric:  Flat affect     Assessment & Plan:   CKD (chronic kidney disease), stage III Has history of CKD stage 3b from prior history of lithium use. Baseline creatinine ~1.9. Appears to be euvolemic on exam. Currently taking lisinopril-HCTZ.  - BMP today  Healthcare maintenance Declined pneumonia shot. Agreeable to get HIV and hep c screening done today. Also noted to have increase in weight of 25 lbs since last visit. Mr.Stuhr mentions increase in junk food recently due to COVID-19. Will also get hgb a1c as screening for diabetes.  - Hep C, HIV, Hgb A1c  Essential hypertension BP Readings from Last 3 Encounters:  06/09/18 128/88  02/16/18 (!) 140/92  11/25/17 122/85   Current bp at goal. Well controlled on amlodipine and lisinopril-HCTZ. Denies any chest pain, blurry vision, headache, cough, rash.  - C/w current regimen: amlodipine 10mg  daily, lisinopril-HCTZ 50-25mg  daily    Patient discussed with Dr. Sandre Kitty   -Kyle Montgomery, PGY1

## 2018-06-09 NOTE — Patient Instructions (Addendum)
Thank you for allowing [place] to provide your care today. Today we discussed your blood presure     I have ordered HIV, Hep C, A1c and BMP labs for you. I will call if any are abnormal.    Today we made no changes to your medications.    Please follow-up in 6 months.    Should you have any questions or concerns please call the internal medicine clinic at [phone].     Cholesterol  Cholesterol is a fat. Your body needs a small amount of cholesterol. Cholesterol (plaque) may build up in your blood vessels (arteries). That makes you more likely to have a heart attack or stroke. You cannot feel your cholesterol level. Having a blood test is the only way to find out if your level is high. Keep your test results. Work with your doctor to keep your cholesterol at a good level. What do the results mean?  Total cholesterol is how much cholesterol is in your blood.  LDL is bad cholesterol. This is the type that can build up. Try to have low LDL.  HDL is good cholesterol. It cleans your blood vessels and carries LDL away. Try to have high HDL.  Triglycerides are fat that the body can store or burn for energy. What are good levels of cholesterol?  Total cholesterol below 200.  LDL below 100 is good for people who have health risks. LDL below 70 is good for people who have very high risks.  HDL above 40 is good. It is best to have HDL of 60 or higher.  Triglycerides below 150. How can I lower my cholesterol? Diet Follow your diet program as told by your doctor.  Choose fish, white meat chicken, or [place] that is roasted or baked. Try not to eat red meat, fried foods, sausage, or lunch meats.  Eat lots of fresh fruits and vegetables.  Choose whole grains, beans, pasta, potatoes, and cereals.  Choose olive oil, corn oil, or canola oil. Only use small amounts.  Try not to eat butter, mayonnaise, shortening, or palm kernel oils.  Try not to eat foods with trans fats.  Choose low-fat  or nonfat dairy foods. ? Drink skim or nonfat milk. ? Eat low-fat or nonfat yogurt and cheeses. ? Try not to drink whole milk or cream. ? Try not to eat ice cream, egg yolks, or full-fat cheeses.  Healthy desserts include angel food cake, ginger snaps, animal crackers, hard candy, popsicles, and low-fat or nonfat frozen yogurt. Try not to eat pastries, cakes, pies, and cookies.  Exercise Follow your exercise program as told by your doctor.  Be more active. Try gardening, walking, and taking the stairs.  Ask your doctor about ways that you can be more active. Medicine  Take over-the-counter and prescription medicines only as told by your doctor. This information is not intended to replace advice given to you by your health care provider. Make sure you discuss any questions you have with your health care provider. Document Released: 04/05/2008 Document Revised: 08/09/2015 Document Reviewed: 07/20/2015 Elsevier Interactive Patient Education  2019 [employer].

## 2018-06-10 ENCOUNTER — Telehealth: Payer: Self-pay

## 2018-06-10 NOTE — Telephone Encounter (Signed)
Requesting lab results. Please call back.  

## 2018-06-10 NOTE — Telephone Encounter (Signed)
I'll call back once all the labs result. Thank you.

## 2018-06-12 ENCOUNTER — Encounter: Payer: Self-pay | Admitting: Internal Medicine

## 2018-06-12 ENCOUNTER — Other Ambulatory Visit: Payer: Self-pay

## 2018-06-12 LAB — BMP8+ANION GAP
Anion Gap: 25 mmol/L — ABNORMAL HIGH (ref 10.0–18.0)
BUN/Creatinine Ratio: 10 (ref 9–20)
BUN: 22 mg/dL (ref 6–24)
CO2: 19 mmol/L — ABNORMAL LOW (ref 20–29)
Calcium: 10 mg/dL (ref 8.7–10.2)
Chloride: 104 mmol/L (ref 96–106)
Creatinine, Ser: 2.15 mg/dL — ABNORMAL HIGH (ref 0.76–1.27)
GFR calc Af Amer: 42 mL/min/{1.73_m2} — ABNORMAL LOW (ref >59)
GFR calc non Af Amer: 36 mL/min/{1.73_m2} — ABNORMAL LOW (ref >59)
Glucose: 130 mg/dL — ABNORMAL HIGH (ref 65–99)
Potassium: 3.8 mmol/L (ref 3.5–5.2)
Sodium: 148 mmol/L — ABNORMAL HIGH (ref 134–144)

## 2018-06-12 LAB — HEPATITIS C ANTIBODY: Hep C Virus Ab: <0.1 s/co ratio (ref 0.0–0.9)

## 2018-06-12 LAB — HIV ANTIBODY (ROUTINE TESTING W REFLEX): HIV Screen 4th Generation wRfx: NONREACTIVE

## 2018-06-12 NOTE — Assessment & Plan Note (Signed)
Has history of CKD stage 3b from prior history of lithium use. Baseline creatinine ~1.9. Appears to be euvolemic on exam. Currently taking lisinopril-HCTZ.  - BMP today

## 2018-06-12 NOTE — Assessment & Plan Note (Addendum)
Declined pneumonia shot. Agreeable to get HIV and hep c screening done today. Also noted to have increase in weight of 25 lbs since last visit. Kyle Montgomery mentions increase in junk food recently due to COVID-19. Will also get hgb a1c as screening for diabetes.  - Hep C, HIV, Hgb A1c  Addendum: Elevated hgb a1c noted at 6.7 within diabetes range. Will discuss with Kyle Montgomery about dietary modifications to improve glycemic control and begin pharmaceutical intervention with metformin if he agrees.

## 2018-06-12 NOTE — Telephone Encounter (Signed)
Requesting to speak with a nurse about meds. Please call back.  

## 2018-06-12 NOTE — Telephone Encounter (Signed)
Discussed with Kyle Montgomery about results of his HIV, Hep C, hgb a1c and bmp. Discussed worsening renal function at diabetes-range hgb a1c. Explained need for lifestyle modifications w/ low sodium, low sugar diet as well as increasing exercise. Also discussed option of starting metformin for better glycemic control. Kyle Montgomery expressed understanding along with his mother and they discussed focusing on lifestyle modifications first before trying pharmacological interventions. All other concerns addressed.

## 2018-06-12 NOTE — Telephone Encounter (Signed)
I'll call him back. Thanks

## 2018-06-12 NOTE — Progress Notes (Signed)
[facility] Clinic Attending  Case discussed with Dr. Lee at the time of the visit.  We reviewed the resident's history and exam and pertinent patient test results.  I agree with the assessment, diagnosis, and plan of care documented in the resident's note.  Alexander Raines, M.D., Ph.D.  

## 2018-06-12 NOTE — Telephone Encounter (Signed)
Called Kyle Montgomery - stated he just talked to Dr Conley Rolls and was told his "sugar was up".  He wants to know if he can take cinnamon pills? Thanks

## 2018-06-12 NOTE — Telephone Encounter (Signed)
Discussed with Kyle Montgomery about lack of strong evidence of cinnamon pills helping reduce blood sugar and focusing on avoiding junk food and sugary drinks. [name] expressed understanding.

## 2018-06-12 NOTE — Assessment & Plan Note (Signed)
BP Readings from Last 3 Encounters:  06/09/18 128/88  02/16/18 (!) 140/92  11/25/17 122/85   Current bp at goal. Well controlled on amlodipine and lisinopril-HCTZ. Denies any chest pain, blurry vision, headache, cough, rash.  - C/w current regimen: amlodipine 10mg  daily, lisinopril-HCTZ 50-25mg  daily

## 2018-07-07 ENCOUNTER — Telehealth: Payer: Self-pay | Admitting: Internal Medicine

## 2018-07-07 NOTE — Telephone Encounter (Signed)
Pt and his mother state pt is taking cinnamon pills and wants to know if now he can eat rice krispies, milk, orange juice and apple juice.informed them that nurse will send a note to donnaP. And have her call them when she returns to office, they are agreeable with this.

## 2018-07-07 NOTE — Telephone Encounter (Signed)
Pt requesting a nurse to callback [phone]

## 2018-07-10 ENCOUNTER — Telehealth: Payer: Self-pay | Admitting: Dietician

## 2018-07-10 ENCOUNTER — Other Ambulatory Visit: Payer: Self-pay | Admitting: Dietician

## 2018-07-10 DIAGNOSIS — E7849 Other hyperlipidemia: Secondary | ICD-10-CM

## 2018-07-10 NOTE — Telephone Encounter (Signed)
I spoke with Mr. Domagala and his mother on the phone today. He wanted to know what he should eat to to decrease his weight to lower his blood sugar. He does not have a diagnosis of diabetes with recent elevated a1c of 6.7%, but he does have hypertension and CKD stage III. Estimated body mass index is 32.59 kg/m as calculated from the following:   Height as of 02/16/18: 5\' 10"  (1.778 m).   Weight as of 06/09/18: 227 lb 1.6 oz (103 kg).  Suggested weight loss to prevent diabetes and help kidney function is ~16#- 20# making goal weight for him ~205-210#. It appears he has a recent 30# weight gain over the past 6 months.  I suggested he eat more high fiber, low fat foods and drink more water (No soda, juice, sports drinks, high fat/fried foods). Appointment made for Tuesday at 9:15 am. Debera Lat, RD 07/10/2018 9:39 AM.

## 2018-07-10 NOTE — Progress Notes (Signed)
Referral request

## 2018-07-14 ENCOUNTER — Encounter: Payer: Self-pay | Admitting: Dietician

## 2018-07-14 ENCOUNTER — Ambulatory Visit: Payer: Medicaid Other | Admitting: Dietician

## 2018-07-14 ENCOUNTER — Other Ambulatory Visit: Payer: Self-pay

## 2018-07-14 NOTE — Patient Instructions (Addendum)
Results for Kyle Montgomery, Kyle Montgomery (MRN [id number]) as of 07/14/2018 09:34  Ref. Range 11/25/2017 15:03  Total CHOL/HDL Ratio Latest Ref Range: 0.0 - 5.0 ratio 5.9 (H)  Cholesterol, Total Latest Ref Range: 100 - 199 mg/dL 236 (H)  HDL Cholesterol- good blood fat Latest Ref Range: >39 mg/dL 40  LDL (calc)- bad blood fat Latest Ref Range: 0 - 99 mg/dL 156 (H)  Triglycerides Latest Ref Range: 0 - 149 mg/dL 200 (H)  VLDL Cholesterol Cal Latest Ref Range: 5 - 40 mg/dL 40    To help lower the bad fat in your blood- 1-  Take your cholesterol medicine  2- Eat one fruit every day  3- Eat 1 serving of vegetables every day 4- Eat whole grain- wheat- bread instead of garlic bread or biscuit more often 5- Do exercises every day, a 30-45 minutes of walking- this increases your good blood fats 6- eat meat of chicken and [place] not the skin- consider buying [place] drumsticks and chicken thighs for more meat 7- try to use canola or olive oil instead of fat meat. Keep eating peanut butter it is a healthy fat  Recommend continued weight loss goal weight is about 200# (you are 219# today)

## 2018-07-14 NOTE — Progress Notes (Signed)
Documentation: met with [name] today per referral to assist with his hyperlipemia. He has made some changes to his diet drinking water, using whole wheat bread and cereal which has resulted in ~7# weight loss in the past 6 months. He verbalizes understanding to the need for further weight loss. He resides with his mother and step father. He food shops with his mother and cooks/prepares most of his own foods. He eats three meals a day (7 am, 12 Pm and 6 PM) with snacks at least 1-2 per day). He does not do any formal physical activity on a regular basis but is willing to walk and do conditioning exercises while watching his favorite TV shows. Suggestions were made to improve the nutrient density and fat content of his diet. He verbalized understanding and said he would share the information with his mother.  Butch Penny Plyler, RD 07/14/2018 10:07 AM.

## 2018-08-25 ENCOUNTER — Encounter: Payer: Medicaid Other | Admitting: Internal Medicine

## 2018-08-30 ENCOUNTER — Emergency Department (HOSPITAL_COMMUNITY)
Admission: EM | Admit: 2018-08-30 | Discharge: 2018-08-30 | Disposition: A | Discharge status: Home / Self Care | Payer: Medicaid Other | Attending: Emergency Medicine | Admitting: Emergency Medicine

## 2018-08-30 ENCOUNTER — Other Ambulatory Visit: Payer: Self-pay

## 2018-08-30 DIAGNOSIS — F79 Unspecified intellectual disabilities: Secondary | ICD-10-CM | POA: Insufficient documentation

## 2018-08-30 DIAGNOSIS — I129 Hypertensive chronic kidney disease with stage 1 through stage 4 chronic kidney disease, or unspecified chronic kidney disease: Secondary | ICD-10-CM | POA: Insufficient documentation

## 2018-08-30 DIAGNOSIS — N183 Chronic kidney disease, stage 3 (moderate): Secondary | ICD-10-CM | POA: Diagnosis not present

## 2018-08-30 DIAGNOSIS — M79672 Pain in left foot: Secondary | ICD-10-CM | POA: Diagnosis not present

## 2018-08-30 DIAGNOSIS — Z87891 Personal history of nicotine dependence: Secondary | ICD-10-CM | POA: Insufficient documentation

## 2018-08-30 DIAGNOSIS — Z79899 Other long term (current) drug therapy: Secondary | ICD-10-CM | POA: Diagnosis not present

## 2018-08-30 NOTE — ED Triage Notes (Signed)
Pt states that he "bumped" the back of his left foot and it hurts intermittently. Has been taking tylenol and it is still hurting. Able to bear weight and denies swelling.

## 2018-08-30 NOTE — ED Provider Notes (Signed)
[name] EM[facility]GENCY [facility] Provider Note   CSN: [id number] Arrival date & time: 08/30/18  1950     History   Chief Complaint Chief Complaint  Patient presents with  . Foot Injury    HPI Karenann CaiJohn L Odem is a 43 y.o. male who presents with L foot pain. He states that 2 days ago he hit the heel of his foot on the floor when he was getting out of bed and since then he has been having pain in the L heel and side of the foot when he walks. It's better with rest. He took Tylenol prior to coming to the ED and the pain is improved. He is requesting an ACE wrap and crutches.     HPI  Past Medical History:  Diagnosis Date  . Bipolar affective (HCC)    followed by mental health  . Gynecomastia 02/07   normal pituitary by MRI  . 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 Hypertension   . Renal insufficiency    baseline Cr 2.2 - renal US with increased Echo signal, no nephrology eval, no bx    Patient Active Problem List   Diagnosis Date Noted  . Hyperlipidemia 09/06/2014  . Healthcare maintenance 12/01/2012  . CKD (chronic kidney disease), stage III (HCC) 09/01/2012  . Bipolar affective (HCC) 11/06/2005  . Essential hypertension 11/06/2005  . Gynecomastia 11/06/2005    Past Surgical History:  Procedure Laterality Date  . DENTAL SURGERY          Home Medications    Prior to Admission medications   Medication Sig Start Date End Date Taking? Authorizing Provider  acetaminophen (TYLENOL) 325 MG tablet Take 2 tablets (650 mg total) by mouth every 6 (six) hours as needed. 11/25/17   Theotis BarrioLee, Joshua K, MD  amLODipine (NORVASC) 10 MG tablet Take 1 tablet (10 mg total) by mouth daily. 04/07/18   Theotis BarrioLee, Joshua K, MD  atorvastatin (LIPITOR) 40 MG tablet Take 1 tablet (40 mg total) by mouth daily. 04/07/18   Theotis BarrioLee, Joshua K, MD  diclofenac sodium (VOLTAREN) 1 % GEL Apply 4 g topically 4 (four) times daily. Patient taking differently: Apply 4  g topically 4 (four) times daily as needed (pain).  02/27/17   Aviva KluverMurray, Alyssa B, PA-C  divalproex (DEPAKOTE ER) 500 MG 24 hr tablet Take 1 tablet (500 mg total) by mouth at bedtime. 04/07/18 10/04/18  Theotis BarrioLee, Joshua K, MD  lisinopril-hydrochlorothiazide (PRINZIDE,ZESTORETIC) 20-12.5 MG tablet Take 2 tablets by mouth daily. 04/07/18 04/07/19  Theotis BarrioLee, Joshua K, MD  polyethylene glycol [facility](MIRALAX) packet Take 17 g by mouth daily. Patient not taking: Reported on 10/17/2017 08/24/17   Long, Arlyss RepressJoshua G, MD  ziprasidone (GEODON) 80 MG capsule Take 1 capsule (80 mg total) by mouth 2 (two) times daily. 04/07/18 10/04/18  Theotis BarrioLee, Joshua K, MD    Family History Family History  Problem Relation Age of Onset  . Hyperlipidemia Mother   . Hypertension Mother   . Atrial fibrillation Mother   . Hypertension Brother   . Hypertension Maternal Grandmother   . Cancer Maternal Grandmother     Social History Social History   Tobacco Use  . Smoking status: Former Smoker    Types: Cigarettes    Quit date: 01/22/1983    Years since quitting: 35.6  . Smokeless tobacco: Never Used  Substance Use Topics  . Alcohol use: No    Alcohol/week: 0.0 standard drinks  . Drug use: No  Allergies   Patient has no known allergies.   Review of Systems Review of Systems  Musculoskeletal: Positive for arthralgias.  Skin: Negative for wound.     Physical Exam Updated Vital Signs BP (!) 140/93 (BP Location: Right Arm)   Pulse 100   Temp 98.5 F (36.9 C) (Oral)   Resp 18   Ht 5\' 10"  (1.778 m)   Wt 97.5 kg   SpO2 99%   BMI 30.85 kg/m   Physical Exam Vitals signs and nursing note reviewed.  Constitutional:      General: He is not in acute distress.    Appearance: He is well-developed. He is not ill-appearing.     Comments: Mental disability  HENT:     Head: Normocephalic and atraumatic.  Eyes:     General: No scleral icterus.       Right eye: No discharge.        Left eye: No discharge.     Conjunctiva/sclera:  Conjunctivae normal.     Pupils: Pupils are equal, round, and reactive to light.  Neck:     Musculoskeletal: Normal range of motion.  Cardiovascular:     Rate and Rhythm: Normal rate.  Pulmonary:     Effort: Pulmonary effort is normal. No respiratory distress.  Abdominal:     General: There is no distension.  Musculoskeletal:     Comments: Left foot: Poor hygiene. No obvious swelling, deformity, or warmth. Mild tenderness over the lateral foot. FROM. 5/5 strength. N/V intact.   Skin:    General: Skin is warm and dry.  Neurological:     Mental Status: He is alert and oriented to person, place, and time.  Psychiatric:        Behavior: Behavior normal.      ED Treatments / Results  Labs (all labs ordered are listed, but only abnormal results are displayed) Labs Reviewed - No data to display  EKG None  Radiology No results found.  Procedures Procedures (including critical care time)  Medications Ordered in ED Medications - No data to display   Initial Impression / Assessment and Plan / ED Course  I have reviewed the triage vital signs and the nursing notes.  Pertinent labs & imaging results that were available during my care of the patient were reviewed by me and considered in my medical decision making (see chart for details).  43 year old male presents with L foot pain after bumping his foot on the floor. There does not appear to be any obvious trauma. He is declining an xray. Pain has been controlled with Tylenol. He is requesting an ACE wrap and crutches which was provided.  Final Clinical Impressions(s) / ED Diagnoses   Final diagnoses:  Left foot pain    ED Discharge Orders    None       Recardo Evangelist, PA-C 08/30/18 2125    Drenda Freeze, MD 08/30/18 2253

## 2018-08-30 NOTE — ED Notes (Signed)
The pt is c/o of rt foot pain  He injured his foot 2 days ago  He just wants a bandage and crutches.   He does not want an xray.

## 2018-09-01 ENCOUNTER — Encounter: Payer: Medicaid Other | Admitting: Internal Medicine

## 2018-09-10 ENCOUNTER — Encounter: Payer: Medicaid Other | Admitting: Internal Medicine

## 2018-09-16 ENCOUNTER — Ambulatory Visit (INDEPENDENT_AMBULATORY_CARE_PROVIDER_SITE_OTHER): Payer: Medicaid Other | Admitting: Internal Medicine

## 2018-09-16 ENCOUNTER — Other Ambulatory Visit: Payer: Self-pay

## 2018-09-16 ENCOUNTER — Encounter: Payer: Self-pay | Admitting: Internal Medicine

## 2018-09-16 VITALS — BP 122/84 | HR 91 | Temp 98.4°F | Wt 210.3 lb

## 2018-09-16 DIAGNOSIS — R7303 Prediabetes: Secondary | ICD-10-CM

## 2018-09-16 DIAGNOSIS — I10 Essential (primary) hypertension: Secondary | ICD-10-CM | POA: Diagnosis not present

## 2018-09-16 DIAGNOSIS — F319 Bipolar disorder, unspecified: Secondary | ICD-10-CM

## 2018-09-16 DIAGNOSIS — E785 Hyperlipidemia, unspecified: Secondary | ICD-10-CM | POA: Diagnosis not present

## 2018-09-16 DIAGNOSIS — F3170 Bipolar disorder, currently in remission, most recent episode unspecified: Secondary | ICD-10-CM

## 2018-09-16 DIAGNOSIS — Z79899 Other long term (current) drug therapy: Secondary | ICD-10-CM

## 2018-09-16 DIAGNOSIS — E7849 Other hyperlipidemia: Secondary | ICD-10-CM

## 2018-09-16 LAB — POCT GLYCOSYLATED HEMOGLOBIN (HGB A1C): Hemoglobin A1C: 6 % — AB (ref 4.0–5.6)

## 2018-09-16 LAB — GLUCOSE, CAPILLARY: Glucose-Capillary: 173 mg/dL — ABNORMAL HIGH (ref 70–99)

## 2018-09-16 MED ORDER — AMLODIPINE BESYLATE 10 MG PO TABS: 10.0000 mg | ORAL_TABLET | Freq: Every day | ORAL | 1 refills | Status: DC

## 2018-09-16 MED ORDER — ATORVASTATIN CALCIUM 40 MG PO TABS: 40.0000 mg | ORAL_TABLET | Freq: Every day | ORAL | 1 refills | Status: DC

## 2018-09-16 MED ORDER — DIVALPROEX SODIUM ER 500 MG PO TB24: 500.0000 mg | ORAL_TABLET | Freq: Every day | ORAL | 1 refills | Status: DC

## 2018-09-16 MED ORDER — LISINOPRIL-HYDROCHLOROTHIAZIDE 20-12.5 MG PO TABS: 2.0000 | ORAL_TABLET | Freq: Every day | ORAL | 1 refills | Status: DC

## 2018-09-16 MED ORDER — ZIPRASIDONE HCL 80 MG PO CAPS: 80.0000 mg | ORAL_CAPSULE | Freq: Two times a day (BID) | ORAL | 1 refills | Status: DC

## 2018-09-16 NOTE — Assessment & Plan Note (Signed)
  Lab Results  Component Value Date   HGBA1C 6.0 (A) 09/16/2018   Presents for follow up after found to have hgb a1c of 6.7 on screening. He had declined medical management at the time of diagnosis and focused on lifestyle modifications and dietary changes working with medical nutrition. He has done well in cutting out high carb foods and his hemoglobin a1c has decreased to pre-diabetic range at 6.0. Discussed with Kyle Montgomery regarding continued risk and discussed initiating metformin but Kyle Montgomery states he would like to continue with lifestyle modifications.  - C/w low-carb diet - F/u in 6 months

## 2018-09-16 NOTE — Assessment & Plan Note (Signed)
Lipid Panel     Component Value Date/Time   CHOL 236 (H) 11/25/2017 1503   TRIG 200 (H) 11/25/2017 1503   HDL 40 11/25/2017 1503   CHOLHDL 5.9 (H) 11/25/2017 1503   CHOLHDL 6.9 11/23/2013 1603   VLDL 54 (H) 11/23/2013 1603   LDLCALC 156 (H) 11/25/2017 1503   ASCVD 10-year risk of 6.3% on borderline range last checked a year ago. Used to be at high risk of 8.6% prior to starting medical therapy. Currently on high-intensity statin. Discussed with mr.Wingrove that with his dietary changes, his risk should continue to decline. Mr.Plamondon expressed understanding.  - Refill sent for atorvastatin

## 2018-09-16 NOTE — Assessment & Plan Note (Signed)
Pt requires refills on medications with associated diagnosis above.  Reviewed disease process and find this medication to be necessary, will not change dose or alter current therapy. 

## 2018-09-16 NOTE — Progress Notes (Signed)
CC: Hypertension  HPI: Kyle Montgomery is a 43 y.o. M w/ PMH of bipolar disorder, HTN and recent diagnosis of diabetes presenting to [facility] for management of his chronic conditions. Recently he had an ED visit after he hit his heel while getting out of bed. He states he received some ACE bandaging and his pain has been well controlled with Tylenol and he is no longer endorsing any pain, numbness, tingling or ambulatory dysfunction. He states he was able to meet with dietitian and received information regarding avoidance of high carb foods and he has started to avoid sugary drinks, ice cream and candy. He states he feels well and denies any polyuria, polydipsia or polyphagia. No other complaints at this time.  Past Medical History:  Diagnosis Date  . Bipolar affective ([place])    followed by mental health  . Gynecomastia 02/07   normal pituitary by MRI  . Hyperprolactinemia ([place]) 09/27/04   felt 2/2 lithium and fluphenazine  . Hypertension   . Renal insufficiency    baseline Cr 2.2 - renal US with increased Echo signal, no nephrology eval, no bx    Review of Systems: Review of Systems  Constitutional: Negative for chills, fever and malaise/fatigue.  Eyes: Negative for blurred vision.  Respiratory: Negative for shortness of breath.   Cardiovascular: Negative for chest pain and leg swelling.  Gastrointestinal: Negative for constipation, diarrhea, nausea and vomiting.  Genitourinary: Negative for frequency.  Neurological: Negative for sensory change.  Endo/Heme/Allergies: Negative for polydipsia.     Physical Exam: Vitals:   09/16/18 1353  BP: 122/84  Pulse: 91  Temp: 98.4 F (36.9 C)  TempSrc: Oral  SpO2: 99%  Weight: 210 lb 4.8 oz (95.4 kg)    Physical Exam  Constitutional: He is oriented to person, place, and time. He appears well-developed and well-nourished. No distress.  Cardiovascular: Normal rate, regular rhythm, normal heart sounds and intact distal pulses.  No murmur  heard. Respiratory: Effort normal and breath sounds normal. He has no wheezes. He has no rales.  GI: Soft. Bowel sounds are normal. He exhibits no distension. There is no abdominal tenderness.  Musculoskeletal: Normal range of motion.        General: No edema.  Neurological: He is alert and oriented to person, place, and time.  Pinprick and fine touch sensation intact bilateral feet     Assessment & Plan:   Pre-diabetes  Lab Results  Component Value Date   HGBA1C 6.0 (A) 09/16/2018   Presents for follow up after found to have hgb a1c of 6.7 on screening. He had declined medical management at the time of diagnosis and focused on lifestyle modifications and dietary changes working with medical nutrition. He has done well in cutting out high carb foods and his hemoglobin a1c has decreased to pre-diabetic range at 6.0. Discussed with Kyle Montgomery regarding continued risk and discussed initiating metformin but Kyle Montgomery states he would like to continue with lifestyle modifications.  - C/w low-carb diet - F/u in 6 months  Hyperlipidemia Lipid Panel     Component Value Date/Time   CHOL 236 (H) 11/25/2017 1503   TRIG 200 (H) 11/25/2017 1503   HDL 40 11/25/2017 1503   CHOLHDL 5.9 (H) 11/25/2017 1503   CHOLHDL 6.9 11/23/2013 1603   VLDL 54 (H) 11/23/2013 1603   LDLCALC 156 (H) 11/25/2017 1503   ASCVD 10-year risk of 6.3% on borderline range last checked a year ago. Used to be at high risk of 8.6% prior to starting  medical therapy. Currently on high-intensity statin. Discussed with Kyle Montgomery that with his dietary changes, his risk should continue to decline. Kyle Montgomery expressed understanding.  - Refill sent for atorvastatin   Bipolar affective (HCC) Pt requires refills on medications with associated diagnosis above.  Reviewed disease process and find this medication to be necessary, will not change dose or alter current therapy.  Essential hypertension BP Readings from Last 3  Encounters:  09/16/18 122/84  08/30/18 (!) 140/93  06/09/18 128/88   Blood pressure at goal. Well controlled on amlodipine, lisinopril-HCTZ. Requesting refill today  - C/w current regimen: amlodipine 10mg , lisinopril-HCTZ 50-25mg  daily    Patient discussed with Dr. Criselda PeachesMullen   -Judeth CornfieldJoshua Akshith Moncus, PGY2 [facility] Pager: [phone]

## 2018-09-16 NOTE — Patient Instructions (Signed)
Thank you for allowing [place] to provide your care today. Today we discussed your diabetes and how it is now down to pre-diabetes range    I have ordered no additional labs for you.  Today we made no changes to your medications.    Please follow-up in 6 month.    Should you have any questions or concerns please call the internal medicine clinic at [phone].     DASH Eating Plan DASH stands for "Dietary Approaches to Stop Hypertension." The DASH eating plan is a healthy eating plan that has been shown to reduce high blood pressure (hypertension). It may also reduce your risk for type 2 diabetes, heart disease, and stroke. The DASH eating plan may also help with weight loss. What are tips for following this plan?  General guidelines  Avoid eating more than 2,300 mg (milligrams) of salt (sodium) a day. If you have hypertension, you may need to reduce your sodium intake to 1,500 mg a day.  Limit alcohol intake to no more than 1 drink a day for nonpregnant women and 2 drinks a day for men. One drink equals 12 oz of beer, 5 oz of wine, or 1 oz of hard liquor.  Work with your health care provider to maintain a healthy body weight or to lose weight. Ask what an ideal weight is for you.  Get at least 30 minutes of exercise that causes your heart to beat faster (aerobic exercise) most days of the week. Activities may include walking, swimming, or biking.  Work with your health care provider or diet and nutrition specialist (dietitian) to adjust your eating plan to your individual calorie needs. Reading food labels   Check food labels for the amount of sodium per serving. Choose foods with less than 5 percent of the Daily Value of sodium. Generally, foods with less than 300 mg of sodium per serving fit into this eating plan.  To find whole grains, look for the word "whole" as the first word in the ingredient list. Shopping  Buy products labeled as "low-sodium" or "no salt added."  Buy fresh  foods. Avoid canned foods and premade or frozen meals. Cooking  Avoid adding salt when cooking. Use salt-free seasonings or herbs instead of table salt or sea salt. Check with your health care provider or pharmacist before using salt substitutes.  Do not fry foods. Cook foods using healthy methods such as baking, boiling, grilling, and broiling instead.  Cook with heart-healthy oils, such as olive, canola, soybean, or sunflower oil. Meal planning  Eat a balanced diet that includes: ? 5 or more servings of fruits and vegetables each day. At each meal, try to fill half of your plate with fruits and vegetables. ? Up to 6-8 servings of whole grains each day. ? Less than 6 oz of lean meat, poultry, or fish each day. A 3-oz serving of meat is about the same size as a deck of cards. One egg equals 1 oz. ? 2 servings of low-fat dairy each day. ? A serving of nuts, seeds, or beans 5 times each week. ? Heart-healthy fats. Healthy fats called Omega-3 fatty acids are found in foods such as flaxseeds and coldwater fish, like sardines, salmon, and mackerel.  Limit how much you eat of the following: ? Canned or prepackaged foods. ? Food that is high in trans fat, such as fried foods. ? Food that is high in saturated fat, such as fatty meat. ? Sweets, desserts, sugary drinks, and other foods with  added sugar. ? Full-fat dairy products.  Do not salt foods before eating.  Try to eat at least 2 vegetarian meals each week.  Eat more home-cooked food and less restaurant, buffet, and fast food.  When eating at a restaurant, ask that your food be prepared with less salt or no salt, if possible. What foods are recommended? The items listed may not be a complete list. Talk with your dietitian about what dietary choices are best for you. Grains Whole-grain or whole-wheat bread. Whole-grain or whole-wheat pasta. Brown rice. [place]. Bulgur. Whole-grain and low-sodium cereals. Pita bread. Low-fat,  low-sodium crackers. Whole-wheat flour tortillas. Vegetables Fresh or frozen vegetables (raw, steamed, roasted, or grilled). Low-sodium or reduced-sodium tomato and vegetable juice. Low-sodium or reduced-sodium tomato sauce and tomato paste. Low-sodium or reduced-sodium canned vegetables. Fruits All fresh, dried, or frozen fruit. Canned fruit in natural juice (without added sugar). Meat and other protein foods Skinless chicken or [place]. Ground chicken or [place]. Pork with fat trimmed off. Fish and seafood. Egg whites. Dried beans, peas, or lentils. Unsalted nuts, nut butters, and seeds. Unsalted canned beans. Lean cuts of beef with fat trimmed off. Low-sodium, lean deli meat. Dairy Low-fat (1%) or fat-free (skim) milk. Fat-free, low-fat, or reduced-fat cheeses. Nonfat, low-sodium ricotta or cottage cheese. Low-fat or nonfat yogurt. Low-fat, low-sodium cheese. Fats and oils Soft margarine without trans fats. Vegetable oil. Low-fat, reduced-fat, or light mayonnaise and salad dressings (reduced-sodium). Canola, safflower, olive, soybean, and sunflower oils. Avocado. Seasoning and other foods Herbs. Spices. Seasoning mixes without salt. Unsalted popcorn and pretzels. Fat-free sweets. What foods are not recommended? The items listed may not be a complete list. Talk with your dietitian about what dietary choices are best for you. Grains Baked goods made with fat, such as croissants, muffins, or some breads. Dry pasta or rice meal packs. Vegetables Creamed or fried vegetables. Vegetables in a cheese sauce. Regular canned vegetables (not low-sodium or reduced-sodium). Regular canned tomato sauce and paste (not low-sodium or reduced-sodium). Regular tomato and vegetable juice (not low-sodium or reduced-sodium). Angie Fava. Olives. Fruits Canned fruit in a light or heavy syrup. Fried fruit. Fruit in cream or butter sauce. Meat and other protein foods Fatty cuts of meat. Ribs. Fried meat. Berniece Salines. Sausage.  Bologna and other processed lunch meats. Salami. Fatback. Hotdogs. Bratwurst. Salted nuts and seeds. Canned beans with added salt. Canned or smoked fish. Whole eggs or egg yolks. Chicken or [place] with skin. Dairy Whole or 2% milk, cream, and half-and-half. Whole or full-fat cream cheese. Whole-fat or sweetened yogurt. Full-fat cheese. Nondairy creamers. Whipped toppings. Processed cheese and cheese spreads. Fats and oils Butter. Stick margarine. Lard. Shortening. Ghee. Bacon fat. Tropical oils, such as coconut, palm kernel, or palm oil. Seasoning and other foods Salted popcorn and pretzels. Onion salt, garlic salt, seasoned salt, table salt, and sea salt. Worcestershire sauce. Tartar sauce. Barbecue sauce. Teriyaki sauce. Soy sauce, including reduced-sodium. Steak sauce. Canned and packaged gravies. Fish sauce. Oyster sauce. Cocktail sauce. Horseradish that you find on the shelf. Ketchup. Mustard. Meat flavorings and tenderizers. Bouillon cubes. Hot sauce and Tabasco sauce. Premade or packaged marinades. Premade or packaged taco seasonings. Relishes. Regular salad dressings. Where to find more information:  National Heart, Lung, and [place]: https://wilson-eaton.com/  American Heart Association: www.heart.org Summary  The DASH eating plan is a healthy eating plan that has been shown to reduce high blood pressure (hypertension). It may also reduce your risk for type 2 diabetes, heart disease, and stroke.  With the DASH  eating plan, you should limit salt (sodium) intake to 2,300 mg a day. If you have hypertension, you may need to reduce your sodium intake to 1,500 mg a day.  When on the DASH eating plan, aim to eat more fresh fruits and vegetables, whole grains, lean proteins, low-fat dairy, and heart-healthy fats.  Work with your health care provider or diet and nutrition specialist (dietitian) to adjust your eating plan to your individual calorie needs. This information is not intended to  replace advice given to you by your health care provider. Make sure you discuss any questions you have with your health care provider. Document Released: 12/27/2010 Document Revised: 12/20/2016 Document Reviewed: 01/01/2016 Elsevier Patient Education  2020 [employer]

## 2018-09-16 NOTE — Assessment & Plan Note (Signed)
BP Readings from Last 3 Encounters:  09/16/18 122/84  08/30/18 (!) 140/93  06/09/18 128/88   Blood pressure at goal. Well controlled on amlodipine, lisinopril-HCTZ. Requesting refill today  - C/w current regimen: amlodipine 10mg , lisinopril-HCTZ 50-25mg  daily

## 2018-09-17 NOTE — Progress Notes (Signed)
Internal Medicine Clinic Attending ° °Case discussed with Dr. Lee at the time of the visit.  We reviewed the resident’s history and exam and pertinent patient test results.  I agree with the assessment, diagnosis, and plan of care documented in the resident’s note.  °

## 2018-11-01 IMAGING — DX DG FOOT COMPLETE 3+V*R*
3 series · 3 of 3 positions shown · non-contrast
Comparison: 08/03/2016, 09/11/2008.

CLINICAL DATA: 42-year-old who tripped and fell 2 days ago,
injuring the RIGHT foot. Persistent pain and swelling. Initial
encounter.

EXAM:
RIGHT FOOT COMPLETE - 3+ VIEW

[foot ap]
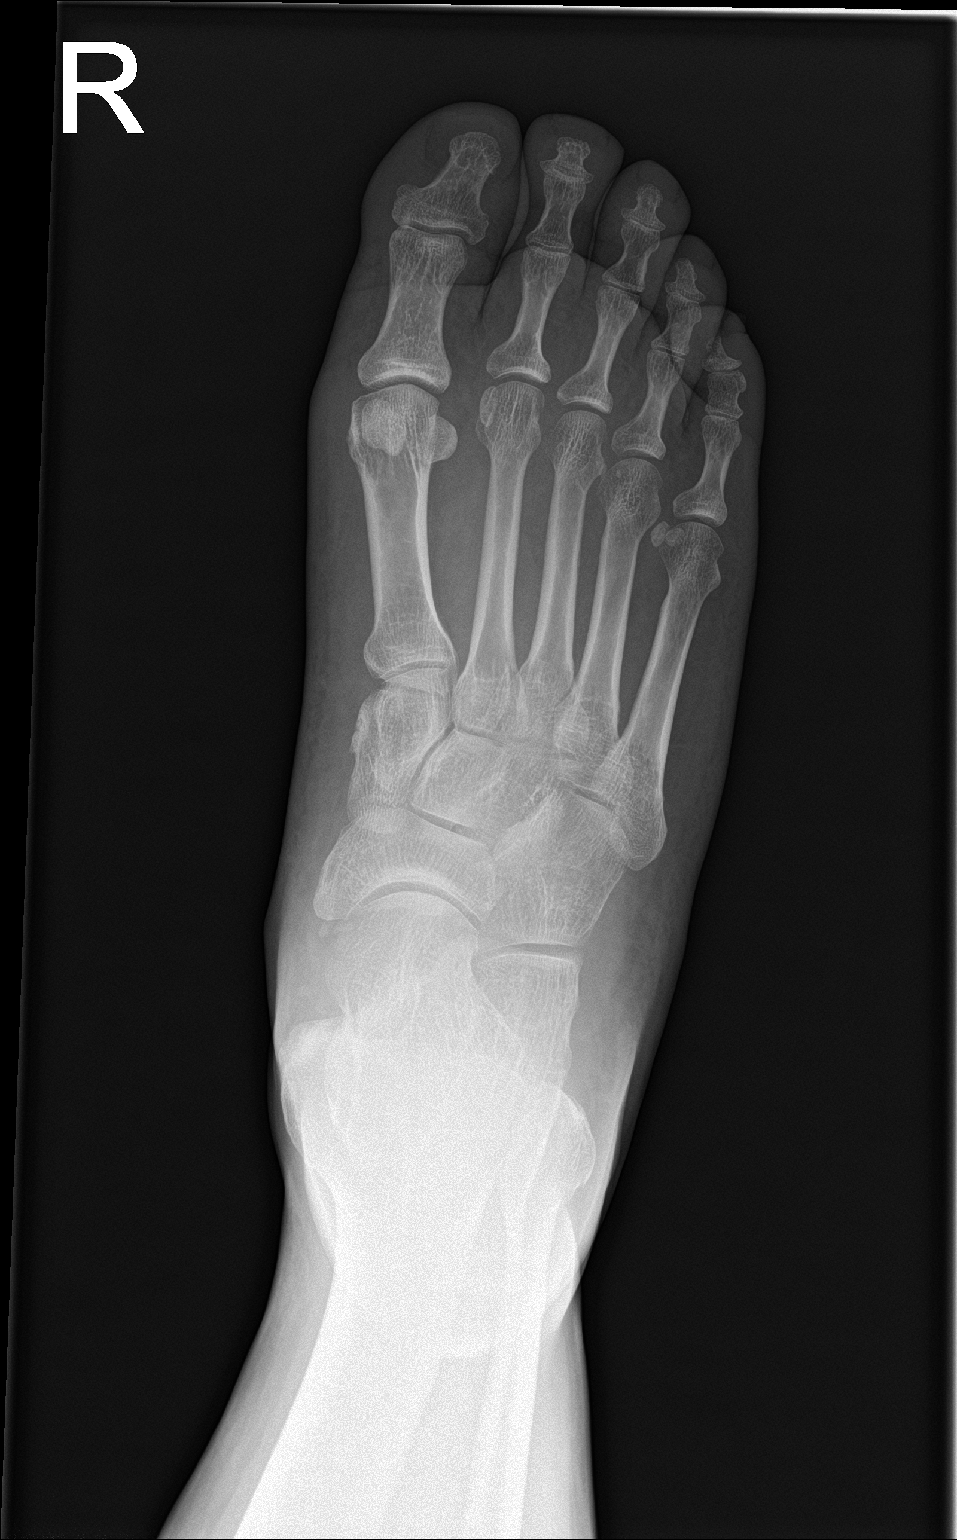

[foot obl]
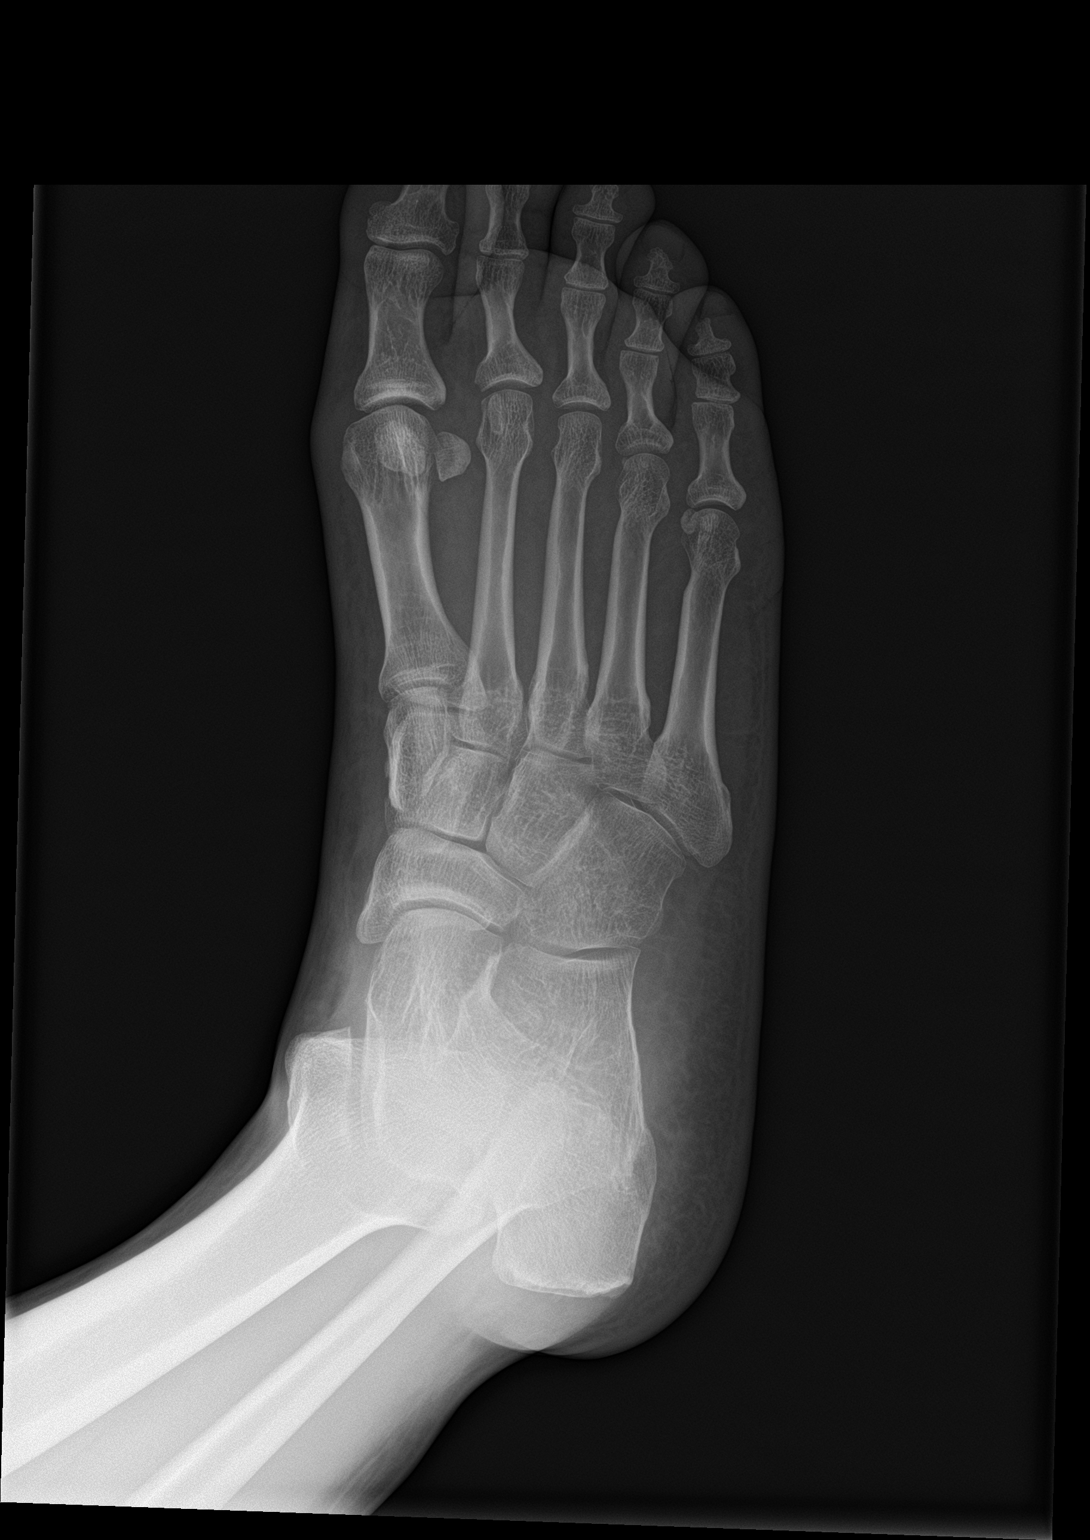

[foot lat]
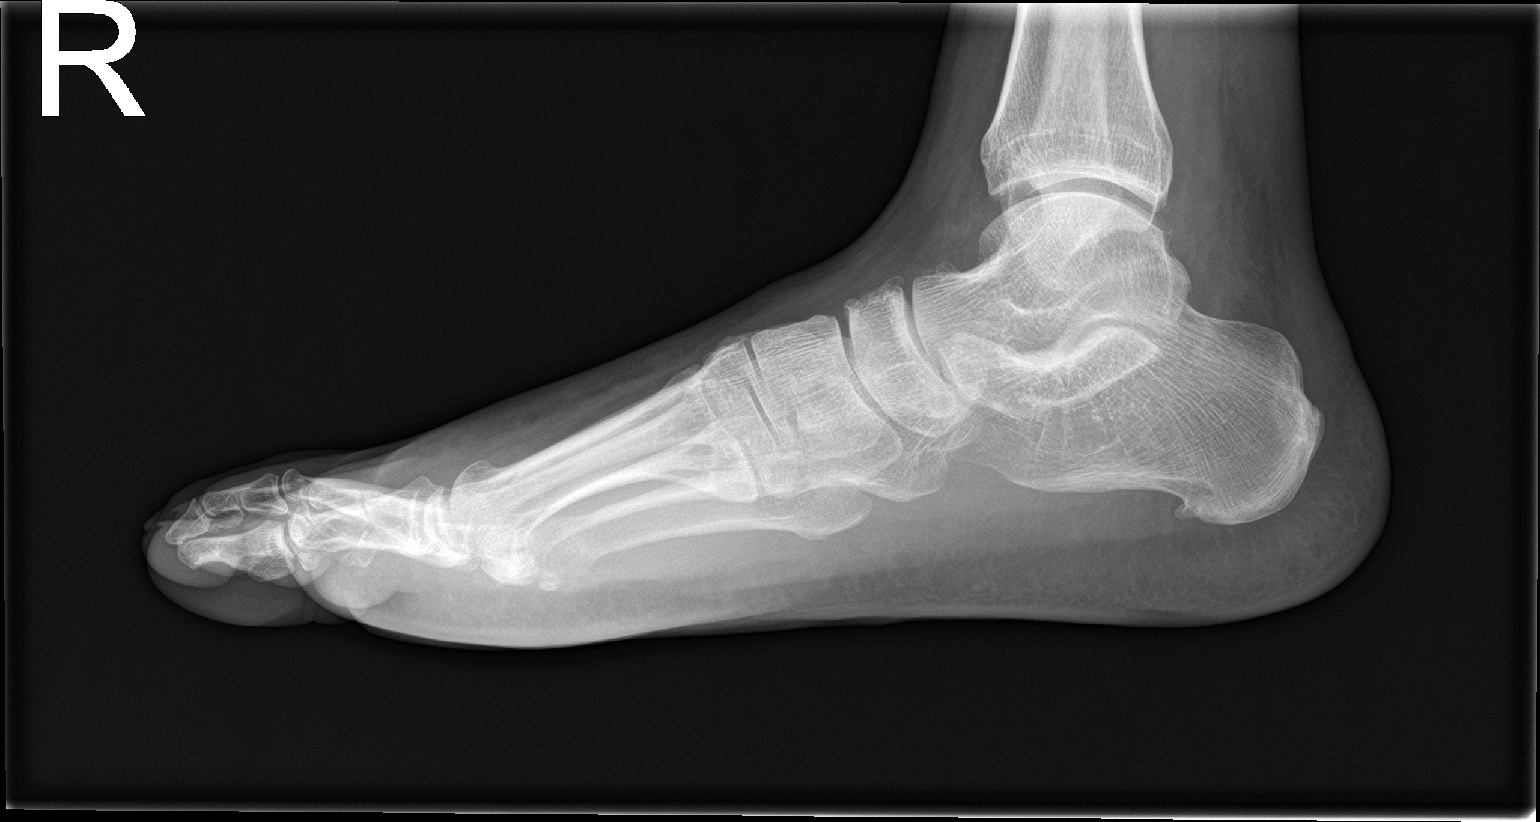

[3 of 3 positions shown; findings below may reference images not displayed]

FINDINGS: No evidence of acute fracture or dislocation. Joint spaces well
preserved. Well-preserved bone mineral density. Very small plantar
calcaneal spur, unchanged since the July 2016 examination. No
significant intrinsic osseous abnormalities.
IMPRESSION: No acute or significant osseous abnormality. Very small plantar
calcaneal spur.

## 2018-11-11 ENCOUNTER — Other Ambulatory Visit: Payer: Self-pay

## 2018-11-11 ENCOUNTER — Emergency Department (HOSPITAL_COMMUNITY)
Admission: EM | Admit: 2018-11-11 | Discharge: 2018-11-11 | Disposition: A | Discharge status: Home / Self Care | Payer: Medicaid Other | Attending: Emergency Medicine | Admitting: Emergency Medicine

## 2018-11-11 ENCOUNTER — Encounter (HOSPITAL_COMMUNITY): Payer: Self-pay | Admitting: Emergency Medicine

## 2018-11-11 DIAGNOSIS — Y999 Unspecified external cause status: Secondary | ICD-10-CM | POA: Diagnosis not present

## 2018-11-11 DIAGNOSIS — Y929 Unspecified place or not applicable: Secondary | ICD-10-CM | POA: Diagnosis not present

## 2018-11-11 DIAGNOSIS — X500XXA Overexertion from strenuous movement or load, initial encounter: Secondary | ICD-10-CM | POA: Insufficient documentation

## 2018-11-11 DIAGNOSIS — Z79899 Other long term (current) drug therapy: Secondary | ICD-10-CM | POA: Insufficient documentation

## 2018-11-11 DIAGNOSIS — Y9389 Activity, other specified: Secondary | ICD-10-CM | POA: Diagnosis not present

## 2018-11-11 DIAGNOSIS — M25552 Pain in left hip: Secondary | ICD-10-CM | POA: Insufficient documentation

## 2018-11-11 DIAGNOSIS — N183 Chronic kidney disease, stage 3 unspecified: Secondary | ICD-10-CM | POA: Insufficient documentation

## 2018-11-11 DIAGNOSIS — Z87891 Personal history of nicotine dependence: Secondary | ICD-10-CM | POA: Insufficient documentation

## 2018-11-11 DIAGNOSIS — I129 Hypertensive chronic kidney disease with stage 1 through stage 4 chronic kidney disease, or unspecified chronic kidney disease: Secondary | ICD-10-CM | POA: Insufficient documentation

## 2018-11-11 MED ORDER — TRAMADOL HCL 50 MG PO TABS: 50.0000 mg | ORAL_TABLET | Freq: Four times a day (QID) | ORAL | 0 refills | Status: DC | PRN

## 2018-11-11 NOTE — Discharge Instructions (Signed)
Return if any problems.

## 2018-11-11 NOTE — ED Triage Notes (Signed)
Patient with left hip pain, increasing in pain for the last two days.  He states that he did not injury it, but he has taken some tylenol with no relief in the pain.  He states that it hurts when he presses on it.

## 2018-11-11 NOTE — ED Provider Notes (Signed)
[name] EMERGENCY DEPARTMENT Provider Note   CSN: [id number] Arrival date & time: 11/11/18  0445     History   Chief Complaint Chief Complaint  Patient presents with  . Hip Pain    HPI Kyle Montgomery is a 43 y.o. male.     The history is provided by the patient. No language interpreter was used.  Hip Pain This is a new problem. The current episode started more than 2 days ago. The problem occurs constantly. The symptoms are aggravated by walking. He has tried acetaminophen for the symptoms.   Pt reports his hip is sore after moving heavy furniture for 3 days.   Past Medical History:  Diagnosis Date  . Bipolar affective (HCC)    followed by mental health  . Gynecomastia 02/07   normal pituitary by MRI  . 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 Hypertension   . Renal insufficiency    baseline Cr 2.2 - renal US with increased Echo signal, no nephrology eval, no bx    Patient Active Problem List   Diagnosis Date Noted  . Pre-diabetes 09/16/2018  . Hyperlipidemia 09/06/2014  . Healthcare maintenance 12/01/2012  . CKD (chronic kidney disease), stage III 09/01/2012  . Bipolar affective (HCC) 11/06/2005  . Essential hypertension 11/06/2005  . Gynecomastia 11/06/2005    Past Surgical History:  Procedure Laterality Date  . DENTAL SURGERY          Home Medications    Prior to Admission medications   Medication Sig Start Date End Date Taking? Authorizing Provider  acetaminophen (TYLENOL) 325 MG tablet Take 2 tablets (650 mg total) by mouth every 6 (six) hours as needed. 11/25/17   Theotis Barrio, MD  amLODipine (NORVASC) 10 MG tablet Take 1 tablet (10 mg total) by mouth daily. 09/16/18   Theotis Barrio, MD  atorvastatin (LIPITOR) 40 MG tablet Take 1 tablet (40 mg total) by mouth daily. 09/16/18   Theotis Barrio, MD  divalproex (DEPAKOTE ER) 500 MG 24 hr tablet Take 1 tablet (500 mg total) by mouth at bedtime. 09/16/18  03/15/19  Theotis Barrio, MD  lisinopril-hydrochlorothiazide (ZESTORETIC) 20-12.5 MG tablet Take 2 tablets by mouth daily. 09/16/18   Theotis Barrio, MD  ziprasidone (GEODON) 80 MG capsule Take 1 capsule (80 mg total) by mouth 2 (two) times daily. 09/16/18 03/15/19  Theotis Barrio, MD    Family History Family History  Problem Relation Age of Onset  . Hyperlipidemia Mother   . Hypertension Mother   . Atrial fibrillation Mother   . Hypertension Brother   . Hypertension Maternal Grandmother   . Cancer Maternal Grandmother     Social History Social History   Tobacco Use  . Smoking status: Former Smoker    Types: Cigarettes    Quit date: 01/22/1983    Years since quitting: 35.8  . Smokeless tobacco: Never Used  Substance Use Topics  . Alcohol use: No    Alcohol/week: 0.0 standard drinks  . Drug use: No     Allergies   Patient has no known allergies.   Review of Systems Review of Systems  All other systems reviewed and are negative.    Physical Exam Updated Vital Signs BP 116/83 (BP Location: Left Arm)   Pulse 97   Temp 98.4 F (36.9 C) (Oral)   Resp 18   SpO2 97%   Physical Exam Vitals signs reviewed. Exam conducted with a chaperone present.  Constitutional:      Appearance: Normal appearance.  HENT:     Head: Normocephalic.  Neck:     Musculoskeletal: Normal range of motion.  Cardiovascular:     Rate and Rhythm: Normal rate.     Pulses: Normal pulses.  Pulmonary:     Effort: Pulmonary effort is normal.  Abdominal:     General: Abdomen is flat.  Musculoskeletal:        General: Swelling present.  Skin:    General: Skin is warm.  Neurological:     General: No focal deficit present.     Mental Status: He is alert.  Psychiatric:        Mood and Affect: Mood normal.      ED Treatments / Results  Labs (all labs ordered are listed, but only abnormal results are displayed) Labs Reviewed - No data to display  EKG None  Radiology No results found.   Procedures Procedures (including critical care time)  Medications Ordered in ED Medications - No data to display   Initial Impression / Assessment and Plan / ED Course  I have reviewed the triage vital signs and the nursing notes.  Pertinent labs & imaging results that were available during my care of the patient were reviewed by me and considered in my medical decision making (see chart for details).    MDM  Pt declined xray.  Pt reports he just wants crutches and better pain medication. Pt has been taking tylenol    Pt given crutches and an rx for tramadol   Final Clinical Impressions(s) / ED Diagnoses   Final diagnoses:  Left hip pain    ED Discharge Orders         Ordered    traMADol (ULTRAM) 50 MG tablet  Every 6 hours PRN     11/11/18 0708        An After Visit Summary was printed and given to the patient.    Fransico Meadow, [place] 11/11/18 0932    Veryl Speak, MD 11/11/18 2204

## 2018-11-11 NOTE — ED Notes (Signed)
ED Provider at bedside. 

## 2018-11-11 NOTE — ED Notes (Signed)
Patient verbalizes understanding of discharge instructions. Opportunity for questioning and answers were provided. Armband removed by staff, pt discharged from ED.  

## 2018-11-19 IMAGING — CR DG ANKLE COMPLETE 3+V*L*
3 series · 3 of 3 positions shown · non-contrast
Comparison: 10/07/2015

CLINICAL DATA: Left ankle pain for 3 days.  No known injury.

EXAM:
LEFT ANKLE COMPLETE - 3+ VIEW

[x ankle ap left]
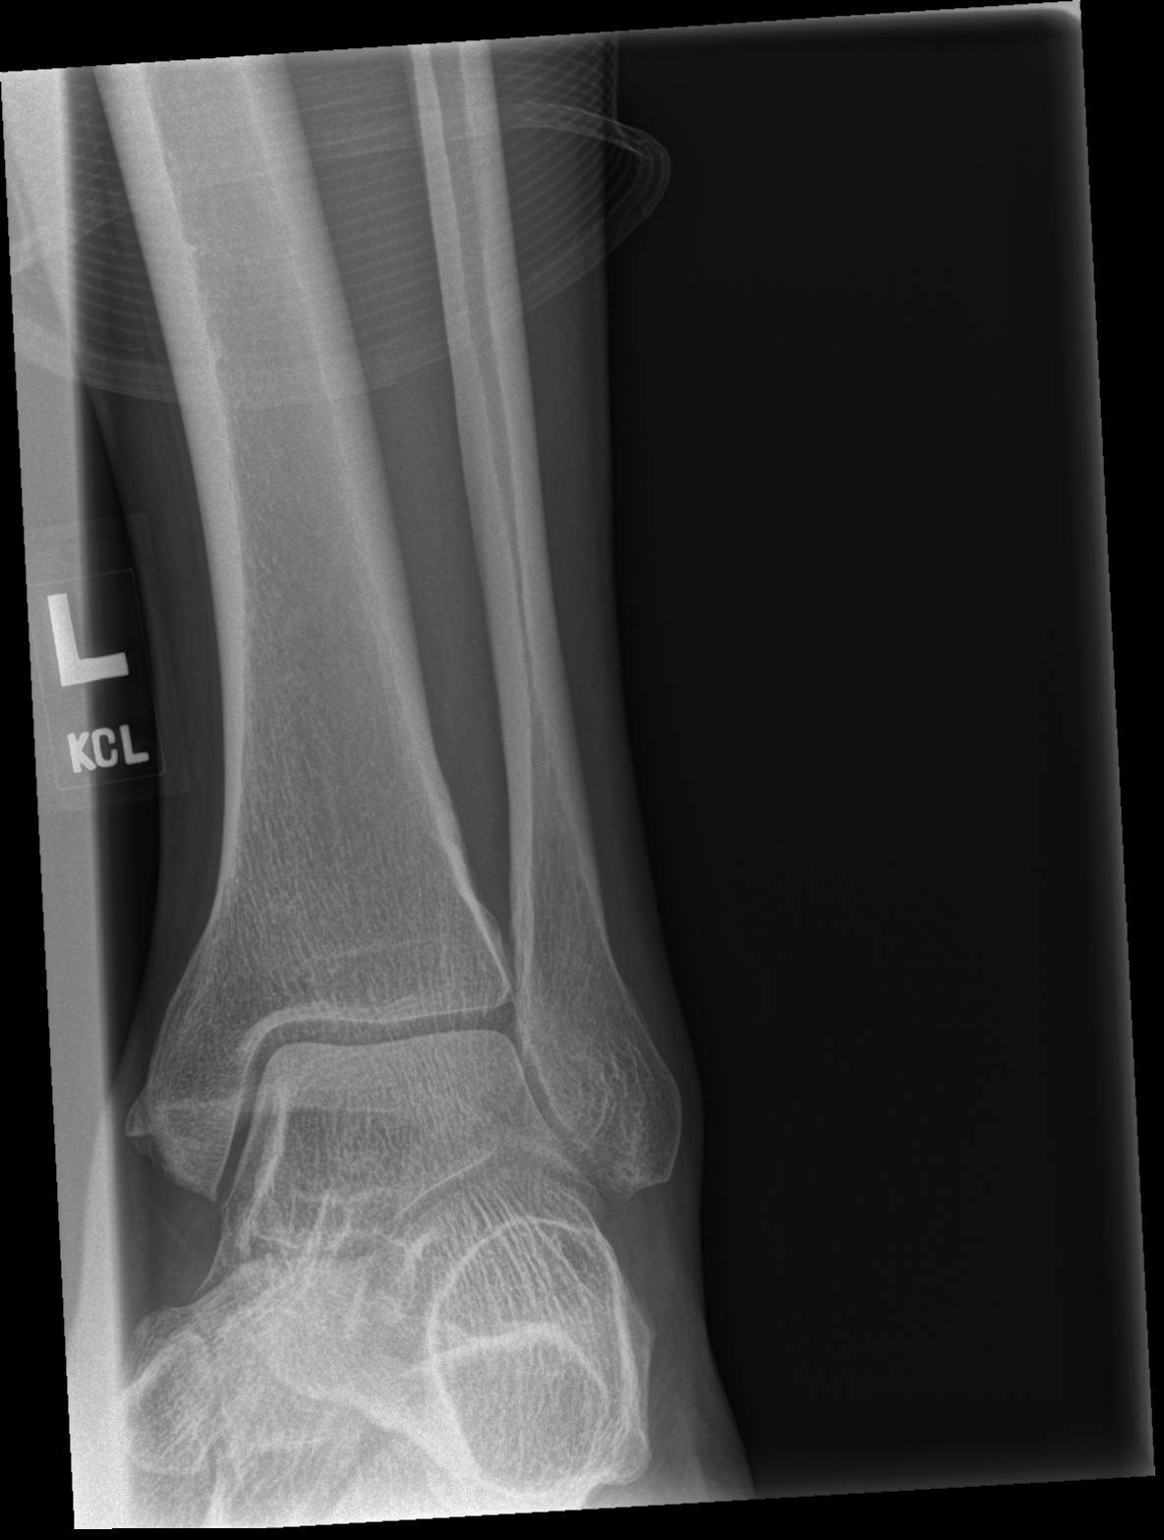

[x ankle obl left]
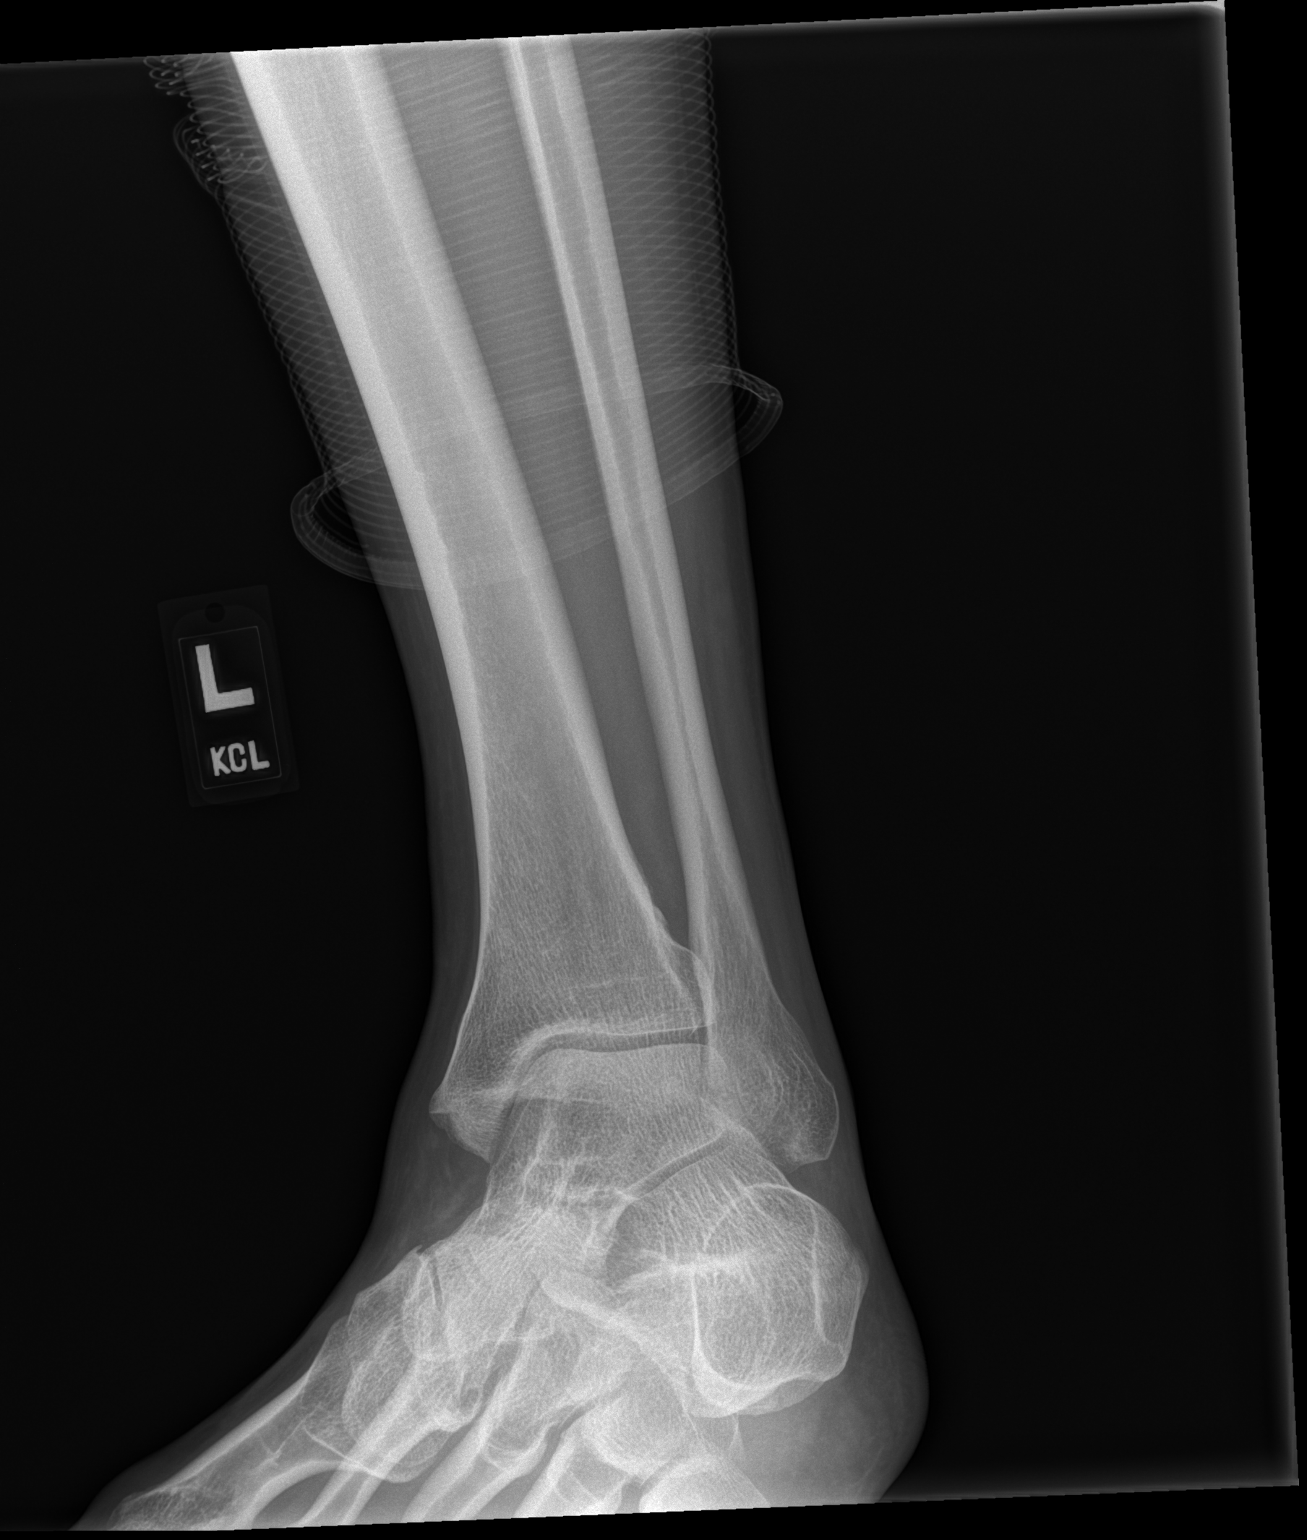

[x ankle lat left]
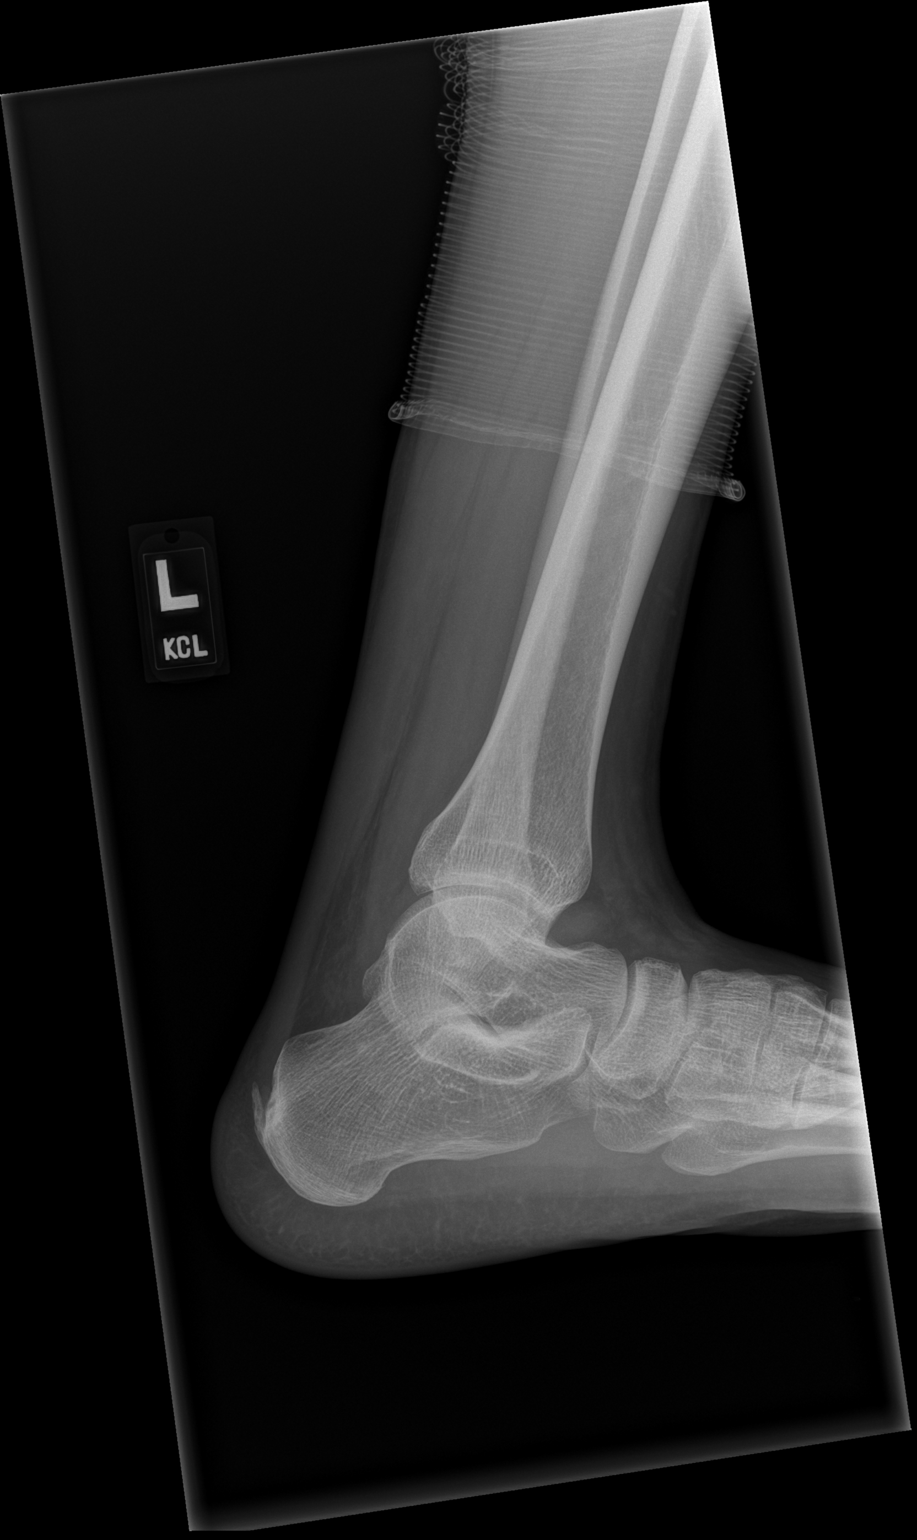

[3 of 3 positions shown; findings below may reference images not displayed]

FINDINGS: There is no evidence of fracture or dislocation. There is a small
ankle joint effusion no discrete arthritis. Posterior calcaneal
enthesophyte at the Achilles insertion, unchanged. Soft tissues are
unremarkable.
IMPRESSION: Small nonspecific ankle effusion. No other significant
abnormalities.

## 2019-03-01 ENCOUNTER — Telehealth: Payer: Self-pay | Admitting: Internal Medicine

## 2019-03-02 ENCOUNTER — Ambulatory Visit: Payer: Medicaid Other | Admitting: Internal Medicine

## 2019-03-02 ENCOUNTER — Encounter: Payer: Self-pay | Admitting: Internal Medicine

## 2019-03-02 DIAGNOSIS — Z79899 Other long term (current) drug therapy: Secondary | ICD-10-CM | POA: Diagnosis not present

## 2019-03-02 DIAGNOSIS — F319 Bipolar disorder, unspecified: Secondary | ICD-10-CM | POA: Diagnosis not present

## 2019-03-02 DIAGNOSIS — M722 Plantar fascial fibromatosis: Secondary | ICD-10-CM | POA: Diagnosis present

## 2019-03-02 DIAGNOSIS — R7303 Prediabetes: Secondary | ICD-10-CM | POA: Diagnosis not present

## 2019-03-02 DIAGNOSIS — I10 Essential (primary) hypertension: Secondary | ICD-10-CM | POA: Diagnosis not present

## 2019-03-02 DIAGNOSIS — E785 Hyperlipidemia, unspecified: Secondary | ICD-10-CM | POA: Diagnosis not present

## 2019-03-02 DIAGNOSIS — F3170 Bipolar disorder, currently in remission, most recent episode unspecified: Secondary | ICD-10-CM

## 2019-03-02 DIAGNOSIS — E7849 Other hyperlipidemia: Secondary | ICD-10-CM

## 2019-03-02 MED ORDER — AMLODIPINE BESYLATE 10 MG PO TABS: 10.0000 mg | ORAL_TABLET | Freq: Every day | ORAL | 1 refills | Status: DC

## 2019-03-02 MED ORDER — ATORVASTATIN CALCIUM 40 MG PO TABS: 40.0000 mg | ORAL_TABLET | Freq: Every day | ORAL | 1 refills | Status: DC

## 2019-03-02 MED ORDER — DIVALPROEX SODIUM ER 500 MG PO TB24: 500.0000 mg | ORAL_TABLET | Freq: Every day | ORAL | 1 refills | Status: DC

## 2019-03-02 MED ORDER — LISINOPRIL-HYDROCHLOROTHIAZIDE 20-12.5 MG PO TABS: 2.0000 | ORAL_TABLET | Freq: Every day | ORAL | 1 refills | Status: DC

## 2019-03-02 MED ORDER — ZIPRASIDONE HCL 80 MG PO CAPS: 80.0000 mg | ORAL_CAPSULE | Freq: Two times a day (BID) | ORAL | 1 refills | Status: DC

## 2019-03-02 NOTE — Progress Notes (Signed)
CC: Foot pain  HPI: [name] is a 44 y.o. M w/ PMH of bipolar affective disorder, HTN, Pre-diabetes presenting to [facility] for management of his chronic conditions. He mentions that he has had some pain in his heel where he hit his heel in the past. He mentions not using his ACE bandaging or brace but is currently taking Tylenol for pain. He denies any significant pain, numbness or tingling.   Past Medical History:  Diagnosis Date  . Bipolar affective ([place])    followed by mental health  . Gynecomastia 02/07   normal pituitary by MRI  . Hyperprolactinemia ([place]) 09/27/04   felt 2/2 lithium and fluphenazine  . Hypertension   . Renal insufficiency    baseline Cr 2.2 - renal US with increased Echo signal, no nephrology eval, no bx   Review of Systems: Review of Systems  Constitutional: Negative for chills and fever.  Eyes: Negative for blurred vision.  Respiratory: Negative for shortness of breath.   Cardiovascular: Negative for chest pain.  Gastrointestinal: Negative for constipation, diarrhea, nausea and vomiting.    Physical Exam: Vitals:   03/02/19 1309 03/02/19 1333  BP: (!) 136/91 116/64  Pulse: 84   Temp: 98.2 F (36.8 C)   TempSrc: Oral   SpO2: 100%   Weight: 216 lb 6.4 oz (98.2 kg)   Height: 5\' 10"  (1.778 m)    Physical Exam  Constitutional: He is oriented to person, place, and time. He appears well-developed and well-nourished. No distress.  HENT:  Mouth/Throat: Oropharynx is clear and moist.  Neck: No thyromegaly present.  Cardiovascular: Normal rate, regular rhythm, normal heart sounds and intact distal pulses.  No murmur heard. Respiratory: Effort normal and breath sounds normal. He has no wheezes. He has no rales.  GI: Soft. Bowel sounds are normal. He exhibits no distension. There is no abdominal tenderness.  Musculoskeletal:        General: No edema. Normal range of motion.     Cervical back: Normal range of motion and neck supple.  Neurological: He  is alert and oriented to person, place, and time.  Skin: Skin is warm and dry.  Dry callous on his heel    Assessment & Plan:   Essential hypertension BP Readings from Last 3 Encounters:  03/02/19 116/64  11/11/18 116/83  09/16/18 122/84   Blood pressure this visit at goal. [name] denies any chest pain, palpitations, shortness of breath or headache. Currently on amlodipine, lisinopril and HCTZ. Requesting refills today.  - C/w amlodipine, lisinopril, HCTZ  Hyperlipidemia Lab Results  Component Value Date   CHOL 236 (H) 11/25/2017   HDL 40 11/25/2017   LDLCALC 156 (H) 11/25/2017   TRIG 200 (H) 11/25/2017   CHOLHDL 5.9 (H) 11/25/2017   Currently on atorvastatin 40mg  daily. Mentions good adherence without significant muscle pain. Requesting refill this visit.  - Will check lipid panel again next visit to assess for adherence. - C/w atorvastatin 40mg  daily  Bipolar affective ([place]) Presents for continued management of bipolar affective disorder. Currently on ziprasidone and depakote. Had prolonged discussion with [name] regarding his psychiatry care. He mentions being seen by psych in the past, possibly with [facility], but for the last couple years, he has been going to the Emergency Department for refills of his psych meds as he feels he receives better care in the hospital. Discussed with [name] and recommended that he establish care with a psychiatrist for closer monitoring of his medications. [name] steadfastly refused. He currently denies  any depressive or manic symptoms.  - Will provide refills as requested - Will need psych referral in the future but for now, patient is refusing - C/w depakote, ziprasidone - Need EKG next visit to assess for QT prolongation.  Plantar fasciitis of right foot Presents w/ complaints of R heel pain. Mentions prior hx of trauma to the area which was alleviated with tylenol and ankle brace. Currently not wearing ankle brace. He  mentions using Tylenol as needed with some improvement. Mentions pain occurs in the am. Denies any new trauma. On physical exam, range of motion intact and no tenderness to palpation. Unable to elicit pain with ankle flexion or extension. Likely diagnosis of plantar fasciitis. Discussed supportive care for now and referral for podiatry for inserts if pain does not resolve. [name] expressed understanding.  - C/w Tylenol, rest, icing - Instructions on home stretching exercises provided - Will hold off on podiatry referral for now as he has transportation difficulties    Patient discussed with Dr. Cleda Daub   -Judeth Cornfield, PGY2 [facility] Internal Medicine Pager: [phone]

## 2019-03-02 NOTE — Patient Instructions (Addendum)
Thank you for allowing [place] to provide your care today. Today we discussed your foot pain and blood pressure    I have ordered no labs for you. I will call if any are abnormal.    Today we made no changes to your medications.    Please follow-up in 3 months.    Should you have any questions or concerns please call the internal medicine clinic at [phone].     Heel Pad Atrophy Rehab Ask your health care provider which exercises are safe for you. Do exercises exactly as told by your health care provider and adjust them as directed. It is normal to feel mild stretching, pulling, tightness, or discomfort as you do these exercises. Stop the exercise right away if you feel sudden pain or your pain gets worse. Do not begin these exercises until told by your health care provider. Stretching exercises These exercises improve the movement and flexibility of your calf muscles. These exercises may also help to relieve pain and stiffness. Standing gastroc stretch This exercise is also called a standing calf (gastroc) stretch. 1. Stand with your hands against a wall. 2. Extend your left / right leg behind you, and bend your front knee slightly. Your heels should be on the floor. 3. Keeping your heels on the floor and your back knee straight, shift your weight toward the wall. You should feel a gentle stretch in the back of your lower leg (calf). 4. Hold this position for __________ seconds. Repeat __________ times. Complete this exercise __________ times a day. Gastroc and soleus stretch, standing This is an exercise in which you stand on a step and use your body weight to stretch your calf muscles. To do this exercise: 1. Stand with the ball of your left / right foot on a step. The ball of your foot is on the walking surface, right under your toes. 2. Keep your other foot firmly on the same step. 3. Hold on to the wall, a railing, or a chair for balance. 4. Slowly lift your other foot, allowing your  body weight to press your left / right heel down over the edge of the step. You should feel a stretch in your left / right calf. 5. Hold this position for __________ seconds. 6. Return both feet to the step. 7. Repeat this exercise with a slight bend in your left / right knee. Repeat __________ times. Complete this exercise __________ times a day. Strengthening exercise This exercise builds strength and endurance in your foot muscles and may help to take pressure off your heel. Endurance is the ability to use your muscles for a long time, even after they get tired. Arch lifts This exercise is sometimes called foot intrinsics. This is an exercise in which you lift the arch part of your foot only. To do this exercise: 1. Sit in a chair with your feet flat on the floor. 2. Keeping your big toe and your heel on the floor, lift only your arch, which is on the inner edge of your left / right foot. Do not move your knee or scrunch your toes. This is a small movement. 3. Hold this position for __________ seconds. 4. Return to the starting position. Repeat __________ times. Complete this exercise __________ times a day. This information is not intended to replace advice given to you by your health care provider. Make sure you discuss any questions you have with your health care provider. Document Revised: 04/30/2018 Document Reviewed: 10/21/2017 Elsevier Patient Education  2020 Elsevier Inc.  

## 2019-03-03 ENCOUNTER — Encounter: Payer: Self-pay | Admitting: Internal Medicine

## 2019-03-03 DIAGNOSIS — M722 Plantar fascial fibromatosis: Secondary | ICD-10-CM | POA: Insufficient documentation

## 2019-03-03 NOTE — Assessment & Plan Note (Signed)
BP Readings from Last 3 Encounters:  03/02/19 116/64  11/11/18 116/83  09/16/18 122/84   Blood pressure this visit at goal. Kyle Montgomery denies any chest pain, palpitations, shortness of breath or headache. Currently on amlodipine, lisinopril and HCTZ. Requesting refills today.  - C/w amlodipine, lisinopril, HCTZ

## 2019-03-03 NOTE — Assessment & Plan Note (Addendum)
Lab Results  Component Value Date   CHOL 236 (H) 11/25/2017   HDL 40 11/25/2017   LDLCALC 156 (H) 11/25/2017   TRIG 200 (H) 11/25/2017   CHOLHDL 5.9 (H) 11/25/2017   Currently on atorvastatin 40mg  daily. Mentions good adherence without significant muscle pain. Requesting refill this visit.  - Will check lipid panel again next visit to assess for adherence. - C/w atorvastatin 40mg  daily

## 2019-03-03 NOTE — Assessment & Plan Note (Addendum)
Presents for continued management of bipolar affective disorder. Currently on ziprasidone and depakote. Had prolonged discussion with Kyle Montgomery regarding his psychiatry care. He mentions being seen by psych in the past, possibly with [facility], but for the last couple years, he has been going to the Emergency Department for refills of his psych meds as he feels he receives better care in the hospital. Discussed with Kyle Montgomery and recommended that he establish care with a psychiatrist for closer monitoring of his medications. Kyle Montgomery steadfastly refused. He currently denies any depressive or manic symptoms.  - Will provide refills as requested - Will need psych referral in the future but for now, patient is refusing - C/w depakote, ziprasidone - Need EKG next visit to assess for QT prolongation.

## 2019-03-03 NOTE — Assessment & Plan Note (Signed)
Presents w/ complaints of R heel pain. Mentions prior hx of trauma to the area which was alleviated with tylenol and ankle brace. Currently not wearing ankle brace. He mentions using Tylenol as needed with some improvement. Mentions pain occurs in the am. Denies any new trauma. On physical exam, range of motion intact and no tenderness to palpation. Unable to elicit pain with ankle flexion or extension. Likely diagnosis of plantar fasciitis. Discussed supportive care for now and referral for podiatry for inserts if pain does not resolve. Kyle Montgomery expressed understanding.  - C/w Tylenol, rest, icing - Instructions on home stretching exercises provided - Will hold off on podiatry referral for now as he has transportation difficulties

## 2019-03-04 NOTE — Progress Notes (Signed)
Internal Medicine Clinic Attending ° °Case discussed with Dr. Lee at the time of the visit.  We reviewed the resident’s history and exam and pertinent patient test results.  I agree with the assessment, diagnosis, and plan of care documented in the resident’s note.  °

## 2019-08-12 ENCOUNTER — Ambulatory Visit: Payer: Medicaid Other | Admitting: Internal Medicine

## 2019-08-12 ENCOUNTER — Encounter: Payer: Self-pay | Admitting: Internal Medicine

## 2019-08-12 ENCOUNTER — Other Ambulatory Visit: Payer: Self-pay

## 2019-08-12 VITALS — BP 128/78 | HR 91 | Temp 98.3°F | Ht 70.0 in | Wt 232.1 lb

## 2019-08-12 DIAGNOSIS — N1832 Chronic kidney disease, stage 3b: Secondary | ICD-10-CM

## 2019-08-12 DIAGNOSIS — E119 Type 2 diabetes mellitus without complications: Secondary | ICD-10-CM

## 2019-08-12 DIAGNOSIS — I129 Hypertensive chronic kidney disease with stage 1 through stage 4 chronic kidney disease, or unspecified chronic kidney disease: Secondary | ICD-10-CM | POA: Diagnosis not present

## 2019-08-12 DIAGNOSIS — N183 Chronic kidney disease, stage 3 unspecified: Secondary | ICD-10-CM

## 2019-08-12 DIAGNOSIS — E7849 Other hyperlipidemia: Secondary | ICD-10-CM | POA: Diagnosis not present

## 2019-08-12 DIAGNOSIS — F3170 Bipolar disorder, currently in remission, most recent episode unspecified: Secondary | ICD-10-CM

## 2019-08-12 DIAGNOSIS — I10 Essential (primary) hypertension: Secondary | ICD-10-CM | POA: Diagnosis not present

## 2019-08-12 LAB — POCT GLYCOSYLATED HEMOGLOBIN (HGB A1C): Hemoglobin A1C: 7 % — AB (ref 4.0–5.6)

## 2019-08-12 LAB — GLUCOSE, CAPILLARY: Glucose-Capillary: 163 mg/dL — ABNORMAL HIGH (ref 70–99)

## 2019-08-12 NOTE — Progress Notes (Signed)
77

## 2019-08-12 NOTE — Assessment & Plan Note (Addendum)
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 Baseline creatinine around 1.9. Does not appear to be hypervolemic. Will get bmp to assess electrolytes and renal fx  - Bmp

## 2019-08-12 NOTE — Patient Instructions (Addendum)
Thank you for allowing [place] to provide your care today. Today we discussed your diabetes    I have ordered bmp, lipid panel labs for you. I will call if any are abnormal.    Today we made no changes to your medications.    Please follow-up in 6 months.    Should you have any questions or concerns please call the internal medicine clinic at [phone].     DASH Eating Plan DASH stands for "Dietary Approaches to Stop Hypertension." The DASH eating plan is a healthy eating plan that has been shown to reduce high blood pressure (hypertension). It may also reduce your risk for type 2 diabetes, heart disease, and stroke. The DASH eating plan may also help with weight loss. What are tips for following this plan?  General guidelines  Avoid eating more than 2,300 mg (milligrams) of salt (sodium) a day. If you have hypertension, you may need to reduce your sodium intake to 1,500 mg a day.  Limit alcohol intake to no more than 1 drink a day for nonpregnant women and 2 drinks a day for men. One drink equals 12 oz of beer, 5 oz of wine, or 1 oz of hard liquor.  Work with your health care provider to maintain a healthy body weight or to lose weight. Ask what an ideal weight is for you.  Get at least 30 minutes of exercise that causes your heart to beat faster (aerobic exercise) most days of the week. Activities may include walking, swimming, or biking.  Work with your health care provider or diet and nutrition specialist (dietitian) to adjust your eating plan to your individual calorie needs. Reading food labels   Check food labels for the amount of sodium per serving. Choose foods with less than 5 percent of the Daily Value of sodium. Generally, foods with less than 300 mg of sodium per serving fit into this eating plan.  To find whole grains, look for the word "whole" as the first word in the ingredient list. Shopping  Buy products labeled as "low-sodium" or "no salt added."  Buy fresh  foods. Avoid canned foods and premade or frozen meals. Cooking  Avoid adding salt when cooking. Use salt-free seasonings or herbs instead of table salt or sea salt. Check with your health care provider or pharmacist before using salt substitutes.  Do not fry foods. Cook foods using healthy methods such as baking, boiling, grilling, and broiling instead.  Cook with heart-healthy oils, such as olive, canola, soybean, or sunflower oil. Meal planning  Eat a balanced diet that includes: ? 5 or more servings of fruits and vegetables each day. At each meal, try to fill half of your plate with fruits and vegetables. ? Up to 6-8 servings of whole grains each day. ? Less than 6 oz of lean meat, poultry, or fish each day. A 3-oz serving of meat is about the same size as a deck of cards. One egg equals 1 oz. ? 2 servings of low-fat dairy each day. ? A serving of nuts, seeds, or beans 5 times each week. ? Heart-healthy fats. Healthy fats called Omega-3 fatty acids are found in foods such as flaxseeds and coldwater fish, like sardines, salmon, and mackerel.  Limit how much you eat of the following: ? Canned or prepackaged foods. ? Food that is high in trans fat, such as fried foods. ? Food that is high in saturated fat, such as fatty meat. ? Sweets, desserts, sugary drinks, and other foods  with added sugar. ? Full-fat dairy products.  Do not salt foods before eating.  Try to eat at least 2 vegetarian meals each week.  Eat more home-cooked food and less restaurant, buffet, and fast food.  When eating at a restaurant, ask that your food be prepared with less salt or no salt, if possible. What foods are recommended? The items listed may not be a complete list. Talk with your dietitian about what dietary choices are best for you. Grains Whole-grain or whole-wheat bread. Whole-grain or whole-wheat pasta. Brown rice. [place]. Bulgur. Whole-grain and low-sodium cereals. Pita bread. Low-fat,  low-sodium crackers. Whole-wheat flour tortillas. Vegetables Fresh or frozen vegetables (raw, steamed, roasted, or grilled). Low-sodium or reduced-sodium tomato and vegetable juice. Low-sodium or reduced-sodium tomato sauce and tomato paste. Low-sodium or reduced-sodium canned vegetables. Fruits All fresh, dried, or frozen fruit. Canned fruit in natural juice (without added sugar). Meat and other protein foods Skinless chicken or [place]. Ground chicken or [place]. Pork with fat trimmed off. Fish and seafood. Egg whites. Dried beans, peas, or lentils. Unsalted nuts, nut butters, and seeds. Unsalted canned beans. Lean cuts of beef with fat trimmed off. Low-sodium, lean deli meat. Dairy Low-fat (1%) or fat-free (skim) milk. Fat-free, low-fat, or reduced-fat cheeses. Nonfat, low-sodium ricotta or cottage cheese. Low-fat or nonfat yogurt. Low-fat, low-sodium cheese. Fats and oils Soft margarine without trans fats. Vegetable oil. Low-fat, reduced-fat, or light mayonnaise and salad dressings (reduced-sodium). Canola, safflower, olive, soybean, and sunflower oils. Avocado. Seasoning and other foods Herbs. Spices. Seasoning mixes without salt. Unsalted popcorn and pretzels. Fat-free sweets. What foods are not recommended? The items listed may not be a complete list. Talk with your dietitian about what dietary choices are best for you. Grains Baked goods made with fat, such as croissants, muffins, or some breads. Dry pasta or rice meal packs. Vegetables Creamed or fried vegetables. Vegetables in a cheese sauce. Regular canned vegetables (not low-sodium or reduced-sodium). Regular canned tomato sauce and paste (not low-sodium or reduced-sodium). Regular tomato and vegetable juice (not low-sodium or reduced-sodium). Angie Fava. Olives. Fruits Canned fruit in a light or heavy syrup. Fried fruit. Fruit in cream or butter sauce. Meat and other protein foods Fatty cuts of meat. Ribs. Fried meat. Berniece Salines. Sausage.  Bologna and other processed lunch meats. Salami. Fatback. Hotdogs. Bratwurst. Salted nuts and seeds. Canned beans with added salt. Canned or smoked fish. Whole eggs or egg yolks. Chicken or [place] with skin. Dairy Whole or 2% milk, cream, and half-and-half. Whole or full-fat cream cheese. Whole-fat or sweetened yogurt. Full-fat cheese. Nondairy creamers. Whipped toppings. Processed cheese and cheese spreads. Fats and oils Butter. Stick margarine. Lard. Shortening. Ghee. Bacon fat. Tropical oils, such as coconut, palm kernel, or palm oil. Seasoning and other foods Salted popcorn and pretzels. Onion salt, garlic salt, seasoned salt, table salt, and sea salt. Worcestershire sauce. Tartar sauce. Barbecue sauce. Teriyaki sauce. Soy sauce, including reduced-sodium. Steak sauce. Canned and packaged gravies. Fish sauce. Oyster sauce. Cocktail sauce. Horseradish that you find on the shelf. Ketchup. Mustard. Meat flavorings and tenderizers. Bouillon cubes. Hot sauce and Tabasco sauce. Premade or packaged marinades. Premade or packaged taco seasonings. Relishes. Regular salad dressings. Where to find more information:  National Heart, Lung, and [place]: https://wilson-eaton.com/  American Heart Association: www.heart.org Summary  The DASH eating plan is a healthy eating plan that has been shown to reduce high blood pressure (hypertension). It may also reduce your risk for type 2 diabetes, heart disease, and stroke.  With the  DASH eating plan, you should limit salt (sodium) intake to 2,300 mg a day. If you have hypertension, you may need to reduce your sodium intake to 1,500 mg a day.  When on the DASH eating plan, aim to eat more fresh fruits and vegetables, whole grains, lean proteins, low-fat dairy, and heart-healthy fats.  Work with your health care provider or diet and nutrition specialist (dietitian) to adjust your eating plan to your individual calorie needs. This information is not intended to  replace advice given to you by your health care provider. Make sure you discuss any questions you have with your health care provider. Document Revised: 12/20/2016 Document Reviewed: 01/01/2016 Elsevier Patient Education  2020 [employer].

## 2019-08-12 NOTE — Assessment & Plan Note (Addendum)
Lab Results  Component Value Date   HGBA1C 7.0 (A) 08/12/2019   Kyle Montgomery is a 44 yo M w/ PMH of CKD3, Bipolar affective disorder, HTN and prior hx of pre-diabetes presenting to [facility] for management of his chronic conditions. He has no complaints at this time. He mentions feeling well. Denies any polyuria, polydipsia, polyphagia. States he is taking all of his medications as prescribed and denies any significant side effects.  A/P Previously diet-controlled pre-diabetes progressed to diabetes at this visit with hgb a1c of 7 from 6.0. Discussed this new finding with patient and awaiting bmp prior to deciding which diabetic med to start. Discussed importance of avoiding high carb diet.  - Recommended to avoid sugary foods/drinks - Start metformin if renal fx stable (if GFR <45, cannot start metformin reasonable alternative include GLP-1 agonist) - F/u in 3 months  Addendum: GFR of 42. Will start DM treatment with semgalutide

## 2019-08-12 NOTE — Progress Notes (Signed)
CC: High blood pressure  HPI: Kyle Montgomery is a 44 y.o. with PMH listed below presenting with complaint of high blood pressure. Please see problem based assessment and plan for further details.  Past Medical History:  Diagnosis Date  . Bipolar affective (HCC)    followed by mental health  . Gynecomastia 02/07   normal pituitary by MRI  . 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 Hypertension   . Renal insufficiency    baseline Cr 2.2 - renal US with increased Echo signal, no nephrology eval, no bx   Review of Systems: Review of Systems  Constitutional: Negative for chills, fever and malaise/fatigue.  Eyes: Negative for blurred vision.  Respiratory: Negative for shortness of breath.   Cardiovascular: Negative for chest pain, palpitations and leg swelling.  Gastrointestinal: Negative for diarrhea, nausea and vomiting.  Musculoskeletal: Negative for back pain and joint pain.  All other systems reviewed and are negative.   Physical Exam: Vitals:   08/12/19 0833 08/12/19 0849  BP: (!) 141/97 128/78  Pulse: 91   Temp: 98.3 F (36.8 C)   TempSrc: Oral   Weight: 232 lb 1.6 oz (105.3 kg)   Height: 5\' 10"  (1.778 m)    Gen: Well-developed, well nourished, NAD HEENT: NCAT head, hearing intact CV: RRR, S1, S2 normal Pulm: CTAB, No rales, no wheezes Extm: ROM intact, Peripheral pulses intact, No peripheral edema Skin: Dry, Warm, normal turgor, no wounds, no rashes, no lesions  Assessment & Plan:   Type 2 diabetes mellitus treated without insulin (HCC) Lab Results  Component Value Date   HGBA1C 7.0 (A) 08/12/2019   [name] is a 44 yo M w/ PMH of CKD3, Bipolar affective disorder, HTN and prior hx of pre-diabetes presenting to [facility] for management of his chronic conditions. He has no complaints at this time. He mentions feeling well. Denies any polyuria, polydipsia, polyphagia. States he is taking all of his medications as prescribed and  denies any significant side effects.  A/P Previously diet-controlled pre-diabetes progressed to diabetes at this visit with hgb a1c of 7 from 6.0. Discussed this new finding with patient and awaiting bmp prior to deciding which diabetic med to start. Discussed importance of avoiding high carb diet.  - Recommended to avoid sugary foods/drinks - Start metformin if renal fx stable (if GFR <45, cannot start metformin reasonable alternative include GLP-1 agonist) - F/u in 3 months  Addendum: GFR of 42. 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 Baseline creatinine around 1.9. Does not appear to be hypervolemic. Will get bmp to assess electrolytes and renal fx  - Bmp  Bipolar affective (HCC) Pt requires refills on medications with associated diagnosis above.  Reviewed disease process and find this medication to be necessary, will not change dose or alter current therapy.  Hyperlipidemia Lipid Panel     Component Value Date/Time   CHOL 157 08/12/2019 0854   TRIG 244 (H) 08/12/2019 0854   HDL 36 (L) 08/12/2019 0854   CHOLHDL 4.4 08/12/2019 0854   CHOLHDL 6.9 11/23/2013 1603   VLDL 54 (H) 11/23/2013 1603   LDLCALC 80 08/12/2019 0854   LABVLDL 41 (H) 08/12/2019 0854   With history of diabetes and hld, need statin for cholesterol. Currently on atorvastatin 40mg  daily. Mentions adherence w/o significant myalgias  - Lipid panel today - C/w atorvastatin 40mg  daily  Addendum: Meeting goal ldl <  100  Essential hypertension BP Readings from Last 3 Encounters:  08/12/19 128/78  03/02/19 116/64  11/11/18 116/83   Bp at goal. Currently on amlodipine/lisinopril/hctz. Requests refills. Denies any significant side effects. Last bmp 12 months prior  - C/w amlodipine/lisinopril/hctz - BMP    Patient discussed with Dr. Heide Spark  -Kyle Montgomery, PGY3 [facility] Pager: [phone]

## 2019-08-13 ENCOUNTER — Encounter: Payer: Self-pay | Admitting: Internal Medicine

## 2019-08-13 LAB — BMP8+ANION GAP
Anion Gap: 19 mmol/L — ABNORMAL HIGH (ref 10.0–18.0)
BUN/Creatinine Ratio: 11 (ref 9–20)
BUN: 23 mg/dL (ref 6–24)
CO2: 23 mmol/L (ref 20–29)
Calcium: 9.7 mg/dL (ref 8.7–10.2)
Chloride: 101 mmol/L (ref 96–106)
Creatinine, Ser: 2.08 mg/dL — ABNORMAL HIGH (ref 0.76–1.27)
GFR calc Af Amer: 43 mL/min/{1.73_m2} — ABNORMAL LOW (ref >59)
GFR calc non Af Amer: 38 mL/min/{1.73_m2} — ABNORMAL LOW (ref >59)
Glucose: 159 mg/dL — ABNORMAL HIGH (ref 65–99)
Potassium: 3.4 mmol/L — ABNORMAL LOW (ref 3.5–5.2)
Sodium: 143 mmol/L (ref 134–144)

## 2019-08-13 LAB — LIPID PANEL
Chol/HDL Ratio: 4.4 ratio (ref 0.0–5.0)
Cholesterol, Total: 157 mg/dL (ref 100–199)
HDL: 36 mg/dL — ABNORMAL LOW (ref >39)
LDL Chol Calc (NIH): 80 mg/dL (ref 0–99)
Triglycerides: 244 mg/dL — ABNORMAL HIGH (ref 0–149)
VLDL Cholesterol Cal: 41 mg/dL — ABNORMAL HIGH (ref 5–40)

## 2019-08-13 MED ORDER — ZIPRASIDONE HCL 80 MG PO CAPS: 80.0000 mg | ORAL_CAPSULE | Freq: Two times a day (BID) | ORAL | 1 refills | Status: DC

## 2019-08-13 MED ORDER — DIVALPROEX SODIUM ER 500 MG PO TB24: 500.0000 mg | ORAL_TABLET | Freq: Every day | ORAL | 1 refills | Status: DC

## 2019-08-13 MED ORDER — AMLODIPINE BESYLATE 10 MG PO TABS: 10.0000 mg | ORAL_TABLET | Freq: Every day | ORAL | 1 refills | Status: DC

## 2019-08-13 MED ORDER — LISINOPRIL-HYDROCHLOROTHIAZIDE 20-12.5 MG PO TABS: 2.0000 | ORAL_TABLET | Freq: Every day | ORAL | 1 refills | Status: DC

## 2019-08-13 MED ORDER — ATORVASTATIN CALCIUM 40 MG PO TABS: 40.0000 mg | ORAL_TABLET | Freq: Every day | ORAL | 3 refills | Status: DC

## 2019-08-13 MED ORDER — ATORVASTATIN CALCIUM 80 MG PO TABS: 80.0000 mg | ORAL_TABLET | Freq: Every day | ORAL | 3 refills | Status: DC

## 2019-08-13 MED ORDER — RYBELSUS 7 MG PO TABS: 7.0000 mg | ORAL_TABLET | Freq: Every day | ORAL | 2 refills | Status: DC

## 2019-08-13 NOTE — Assessment & Plan Note (Signed)
BP Readings from Last 3 Encounters:  08/12/19 128/78  03/02/19 116/64  11/11/18 116/83   Bp at goal. Currently on amlodipine/lisinopril/hctz. Requests refills. Denies any significant side effects. Last bmp 12 months prior  - C/w amlodipine/lisinopril/hctz - BMP

## 2019-08-13 NOTE — Assessment & Plan Note (Signed)
Lipid Panel     Component Value Date/Time   CHOL 157 08/12/2019 0854   TRIG 244 (H) 08/12/2019 0854   HDL 36 (L) 08/12/2019 0854   CHOLHDL 4.4 08/12/2019 0854   CHOLHDL 6.9 11/23/2013 1603   VLDL 54 (H) 11/23/2013 1603   LDLCALC 80 08/12/2019 0854   LABVLDL 41 (H) 08/12/2019 0854   With history of diabetes and hld, need statin for cholesterol. Currently on atorvastatin 40mg  daily. Mentions adherence w/o significant myalgias  - Lipid panel today - C/w atorvastatin 40mg  daily  Addendum: Meeting goal ldl <100

## 2019-08-13 NOTE — Assessment & Plan Note (Signed)
Pt requires refills on medications with associated diagnosis above.  Reviewed disease process and find this medication to be necessary, will not change dose or alter current therapy. 

## 2019-08-16 ENCOUNTER — Telehealth: Payer: Self-pay | Admitting: Internal Medicine

## 2019-08-16 NOTE — Telephone Encounter (Addendum)
Spoke with patient and pt's mother-insurance would not pay for Rybelsus.  Call made to pharmacy-rx requires a PA.    Per medicaid formulary- Requires trial and failure or insufficient response to metformin containing products (except for diabetic beneficiaries with ASCVD, heart failure, or CKD) unless contraindicated or documented adverse event when using either a preferred or a non-preferred GLP-1 Receptor Agonist and Combination   PA submitted online via cover my meds and sent for review.   (Pt with stage III CKD) Your PA has been faxed to the plan as a paper copy.  You will be notified of the determination electronically and via fax.

## 2019-08-16 NOTE — Progress Notes (Signed)
Internal Medicine Clinic Attending  Case discussed with Dr. Lee  At the time of the visit.  We reviewed the resident's history and exam and pertinent patient test results.  I agree with the assessment, diagnosis, and plan of care documented in the resident's note.    

## 2019-08-16 NOTE — Telephone Encounter (Signed)
Pt is requesting a callback [phone]

## 2019-11-12 ENCOUNTER — Encounter: Payer: Medicaid Other | Admitting: Internal Medicine

## 2020-02-17 ENCOUNTER — Other Ambulatory Visit: Payer: Self-pay

## 2020-02-17 DIAGNOSIS — E7849 Other hyperlipidemia: Secondary | ICD-10-CM

## 2020-02-17 DIAGNOSIS — I10 Essential (primary) hypertension: Secondary | ICD-10-CM

## 2020-02-17 DIAGNOSIS — F3170 Bipolar disorder, currently in remission, most recent episode unspecified: Secondary | ICD-10-CM

## 2020-02-17 DIAGNOSIS — E119 Type 2 diabetes mellitus without complications: Secondary | ICD-10-CM

## 2020-02-17 MED ORDER — AMLODIPINE BESYLATE 10 MG PO TABS: 10.0000 mg | ORAL_TABLET | Freq: Every day | ORAL | 1 refills | Status: DC

## 2020-02-17 MED ORDER — ZIPRASIDONE HCL 80 MG PO CAPS: 80.0000 mg | ORAL_CAPSULE | Freq: Two times a day (BID) | ORAL | 1 refills | Status: DC

## 2020-02-17 MED ORDER — ATORVASTATIN CALCIUM 40 MG PO TABS: 40.0000 mg | ORAL_TABLET | Freq: Every day | ORAL | 3 refills | Status: DC

## 2020-02-17 MED ORDER — LISINOPRIL-HYDROCHLOROTHIAZIDE 20-12.5 MG PO TABS: 2.0000 | ORAL_TABLET | Freq: Every day | ORAL | 1 refills | Status: DC

## 2020-02-17 MED ORDER — RYBELSUS 7 MG PO TABS: 7.0000 mg | ORAL_TABLET | Freq: Every day | ORAL | 2 refills | Status: DC

## 2020-02-17 MED ORDER — DIVALPROEX SODIUM ER 500 MG PO TB24: 500.0000 mg | ORAL_TABLET | Freq: Every day | ORAL | 1 refills | Status: DC

## 2020-02-17 NOTE — Telephone Encounter (Signed)
Requesting all meds to be filled @  [facility] [phone] - [place], [place] - [address] Phone:  [phone]  Fax:  [phone]

## 2020-02-17 NOTE — Telephone Encounter (Signed)
Next appt scheduled 02/23/20 with Dr Einar Crow.

## 2020-02-23 ENCOUNTER — Encounter: Payer: Self-pay | Admitting: Student

## 2020-02-23 ENCOUNTER — Other Ambulatory Visit: Payer: Self-pay

## 2020-02-23 ENCOUNTER — Ambulatory Visit: Payer: Medicaid Other | Admitting: Student

## 2020-02-23 VITALS — BP 128/83 | HR 93 | Wt 216.4 lb

## 2020-02-23 DIAGNOSIS — F3170 Bipolar disorder, currently in remission, most recent episode unspecified: Secondary | ICD-10-CM

## 2020-02-23 DIAGNOSIS — Z0000 Encounter for general adult medical examination without abnormal findings: Secondary | ICD-10-CM | POA: Diagnosis not present

## 2020-02-23 DIAGNOSIS — I10 Essential (primary) hypertension: Secondary | ICD-10-CM

## 2020-02-23 DIAGNOSIS — E7849 Other hyperlipidemia: Secondary | ICD-10-CM | POA: Diagnosis not present

## 2020-02-23 DIAGNOSIS — E119 Type 2 diabetes mellitus without complications: Secondary | ICD-10-CM | POA: Diagnosis present

## 2020-02-23 LAB — POCT GLYCOSYLATED HEMOGLOBIN (HGB A1C): Hemoglobin A1C: 6.4 % — AB (ref 4.0–5.6)

## 2020-02-23 LAB — GLUCOSE, CAPILLARY: Glucose-Capillary: 111 mg/dL — ABNORMAL HIGH (ref 70–99)

## 2020-02-23 LAB — HM DIABETES EYE EXAM

## 2020-02-23 MED ORDER — DIVALPROEX SODIUM ER 500 MG PO TB24: 500.0000 mg | ORAL_TABLET | Freq: Every day | ORAL | 1 refills | Status: DC

## 2020-02-23 MED ORDER — ATORVASTATIN CALCIUM 40 MG PO TABS: 40.0000 mg | ORAL_TABLET | Freq: Every day | ORAL | 3 refills | Status: DC

## 2020-02-23 MED ORDER — LISINOPRIL-HYDROCHLOROTHIAZIDE 20-12.5 MG PO TABS: 2.0000 | ORAL_TABLET | Freq: Every day | ORAL | 1 refills | Status: DC

## 2020-02-23 MED ORDER — RYBELSUS 7 MG PO TABS: 7.0000 mg | ORAL_TABLET | Freq: Every day | ORAL | 2 refills | Status: DC

## 2020-02-23 MED ORDER — AMLODIPINE BESYLATE 10 MG PO TABS
10.0000 mg | ORAL_TABLET | Freq: Every day | ORAL | 1 refills | Status: DC
Start: 2020-02-23 — End: 2020-11-08

## 2020-02-23 MED ORDER — ZIPRASIDONE HCL 80 MG PO CAPS: 80.0000 mg | ORAL_CAPSULE | Freq: Two times a day (BID) | ORAL | 1 refills | Status: DC

## 2020-02-23 NOTE — Progress Notes (Signed)
   CC: High Blood Pressure, Diabetes  HPI:  [name] is a 45 y.o. male with a past medical history stated below and presents today for 6 months check up and follow up on his chronic medical conditions of diabetes, HTN, Bipolar disorder. Please see problem based assessment and plan for additional details.  Past Medical History:  Diagnosis Date  . Bipolar affective (HCC)    followed by mental health  . Gynecomastia 02/07   normal pituitary by MRI  . 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 Hypertension   . Renal insufficiency    baseline Cr 2.2 - renal US with increased Echo signal, no nephrology eval, no bx    Current Outpatient Medications on File Prior to Visit  Medication Sig Dispense Refill  . acetaminophen (TYLENOL) 325 MG tablet Take 2 tablets (650 mg total) by mouth every 6 (six) hours as needed. 15 tablet 0   No current facility-administered medications on file prior to visit.    Family History  Problem Relation Age of Onset  . Hyperlipidemia Mother   . Hypertension Mother   . Atrial fibrillation Mother   . Hypertension Brother   . Hypertension Maternal Grandmother   . Cancer Maternal Grandmother     Social History   Socioeconomic History  . Marital status: Single    Spouse name: Not on file  . Number of children: Not on file  . Years of education: Not on file  . Highest education level: Not on file  Occupational History  . Not on file  Tobacco Use  . Smoking status: Former Smoker    Types: Cigarettes    Quit date: 01/22/1983    Years since quitting: 37.1  . Smokeless tobacco: Never Used  Vaping Use  . Vaping Use: Never used  Substance and Sexual Activity  . Alcohol use: No    Alcohol/week: 0.0 standard drinks  . Drug use: No  . Sexual activity: Not on file  Other Topics Concern  . Not on file  Social History Narrative  . Not on file   Social Determinants of Health   Financial Resource Strain: Not on file   Food Insecurity: Not on file  Transportation Needs: Not on file  Physical Activity: Not on file  Stress: Not on file  Social Connections: Not on file  Intimate Partner Violence: Not on file    Review of Systems: ROS negative except for what is noted on the assessment and plan.  Vitals:   02/23/20 0845  BP: 128/83  Pulse: 93  SpO2: 97%  Weight: 216 lb 6.4 oz (98.2 kg)   Physical Exam: Constitutional: well-appearing, in no acute distress. HENT: normocephalic atraumatic Eyes: conjunctiva non-erythematous Neck: supple Cardiovascular: regular rate and rhythm, no m/r/g Pulmonary/Chest: normal work of breathing on room air, lungs clear to auscultation bilaterally Abdominal: soft, non-tender, non-distended MSK: normal bulk and tone Neurological: alert & oriented x 3 Skin: warm and dry Psych: Normal Mood  Assessment & Plan:   See Encounters Tab for problem based charting.  Patient discussed with Dr. Dario Ave, D.O. [facility], PGY-1 Pager: [phone], Phone: [phone] Date 02/23/2020 Time 9:20 AM

## 2020-02-23 NOTE — Patient Instructions (Signed)
Thank you, [name] for allowing [place] to provide your care today. Today we discussed:  High Blood Pressure - Your blood pressure has been well controlled on your medications! We will not be making any changes, continue to take your amlodipine and lisinopril-hctz  Diabetes- Your A1c has improved over the past few months! Continue to do well in terms of your diet and continue your semaglutide medication. You will be getting an eye exam today.   I have refilled your current medications, if you have any difficulty picking them up, please give our office a call.  I have ordered the following labs for you:   Lab Orders     Glucose, capillary     POC Hbg A1C    Referrals ordered today:   Referral Orders  No referral(s) requested today     I have ordered the following medication/changed the following medications:   Stop the following medications: Medications Discontinued During This Encounter  Medication Reason  . amLODipine (NORVASC) 10 MG tablet Reorder  . atorvastatin (LIPITOR) 40 MG tablet Reorder  . divalproex (DEPAKOTE ER) 500 MG 24 hr tablet Reorder  . lisinopril-hydrochlorothiazide (ZESTORETIC) 20-12.5 MG tablet Reorder  . Semaglutide (RYBELSUS) 7 MG TABS Reorder  . ziprasidone (GEODON) 80 MG capsule Reorder     Start the following medications: Meds ordered this encounter  Medications  . amLODipine (NORVASC) 10 MG tablet    Sig: Take 1 tablet (10 mg total) by mouth daily.    Dispense:  90 tablet    Refill:  1  . atorvastatin (LIPITOR) 40 MG tablet    Sig: Take 1 tablet (40 mg total) by mouth daily.    Dispense:  90 tablet    Refill:  3    Please cancel the 80mg  dose of atorvastatin  . divalproex (DEPAKOTE ER) 500 MG 24 hr tablet    Sig: Take 1 tablet (500 mg total) by mouth at bedtime.    Dispense:  90 tablet    Refill:  1  . lisinopril-hydrochlorothiazide (ZESTORETIC) 20-12.5 MG tablet    Sig: Take 2 tablets by mouth daily.    Dispense:  180 tablet     Refill:  1  . Semaglutide (RYBELSUS) 7 MG TABS    Sig: Take 7 mg by mouth daily.    Dispense:  30 tablet    Refill:  2  . ziprasidone (GEODON) 80 MG capsule    Sig: Take 1 capsule (80 mg total) by mouth 2 (two) times daily.    Dispense:  180 capsule    Refill:  1     Follow up: 6 months    Remember: Continue to work on .   Should you have any questions or concerns please call the internal medicine clinic at [phone].     [phone], D.O. [facility]

## 2020-02-23 NOTE — Assessment & Plan Note (Signed)
BP Readings from Last 3 Encounters:  02/23/20 128/83  08/12/19 128/78  03/02/19 116/64   Assessment: BP at goal. Current HTN regimen of amlodipine, lisinopril-hctz.   Plan: - Continue current regimen of amlodipine/lisinopril/hctz

## 2020-02-23 NOTE — Assessment & Plan Note (Addendum)
Lab Results  Component Value Date   HGBA1C 6.4 (A) 02/23/2020    Assessment: A1c improved from 7.0 to 6.4. Started on rybelsus during his last visit. Continuing to work on his diet, he endorses eating vegetables and avoiding surgary drinks.   Eye exam performed in the clinic by Lupita Leash  Plan: - Continue rybelsus - Repeat A1c in 6 months

## 2020-02-23 NOTE — Progress Notes (Signed)
Retinal images were done today as part of this patient's yearly diabetes health maintenance program. They were transmitted electronically to an ophthalmologist who will interpret them and send a report back with their findings. This was explained to the patient and they were also informed that the results would be communicated to them either by phone,  mail or both.  Norm Parcel, RD 02/23/2020 9:34 AM.

## 2020-02-23 NOTE — Assessment & Plan Note (Signed)
Eye exam today. Patient has yet to receive COVID booster. We discussed risk and benefits today, he states he will think about it.

## 2020-02-28 NOTE — Progress Notes (Signed)
Internal Medicine Clinic Attending  Case discussed with Dr. Katsadouros  At the time of the visit.  We reviewed the resident's history and exam and pertinent patient test results.  I agree with the assessment, diagnosis, and plan of care documented in the resident's note.  

## 2020-03-15 ENCOUNTER — Encounter (INDEPENDENT_AMBULATORY_CARE_PROVIDER_SITE_OTHER): Payer: Medicaid Other | Admitting: Dietician

## 2020-03-15 DIAGNOSIS — E119 Type 2 diabetes mellitus without complications: Secondary | ICD-10-CM | POA: Diagnosis present

## 2020-06-10 ENCOUNTER — Encounter: Payer: Self-pay | Admitting: *Deleted

## 2020-06-12 ENCOUNTER — Emergency Department (HOSPITAL_COMMUNITY): Payer: Medicaid Other

## 2020-06-12 ENCOUNTER — Encounter (HOSPITAL_COMMUNITY): Payer: Self-pay | Admitting: Emergency Medicine

## 2020-06-12 ENCOUNTER — Emergency Department (HOSPITAL_COMMUNITY)
Admission: EM | Admit: 2020-06-12 | Discharge: 2020-06-12 | Disposition: A | Discharge status: Home / Self Care | Payer: Medicaid Other | Attending: Emergency Medicine | Admitting: Emergency Medicine

## 2020-06-12 DIAGNOSIS — E1122 Type 2 diabetes mellitus with diabetic chronic kidney disease: Secondary | ICD-10-CM | POA: Insufficient documentation

## 2020-06-12 DIAGNOSIS — X501XXA Overexertion from prolonged static or awkward postures, initial encounter: Secondary | ICD-10-CM | POA: Diagnosis not present

## 2020-06-12 DIAGNOSIS — N183 Chronic kidney disease, stage 3 unspecified: Secondary | ICD-10-CM | POA: Insufficient documentation

## 2020-06-12 DIAGNOSIS — M25562 Pain in left knee: Secondary | ICD-10-CM | POA: Diagnosis present

## 2020-06-12 DIAGNOSIS — Z79899 Other long term (current) drug therapy: Secondary | ICD-10-CM | POA: Insufficient documentation

## 2020-06-12 DIAGNOSIS — Z87891 Personal history of nicotine dependence: Secondary | ICD-10-CM | POA: Diagnosis not present

## 2020-06-12 DIAGNOSIS — Y9301 Activity, walking, marching and hiking: Secondary | ICD-10-CM | POA: Insufficient documentation

## 2020-06-12 DIAGNOSIS — I129 Hypertensive chronic kidney disease with stage 1 through stage 4 chronic kidney disease, or unspecified chronic kidney disease: Secondary | ICD-10-CM | POA: Insufficient documentation

## 2020-06-12 MED ORDER — DICLOFENAC SODIUM 1 % EX GEL: 2.0000 g | Freq: Three times a day (TID) | CUTANEOUS | 0 refills | Status: DC

## 2020-06-12 NOTE — ED Triage Notes (Signed)
Pt here from home after twisting his knee walking down some steps 3 days ago, able to walk but painful

## 2020-06-12 NOTE — ED Provider Notes (Signed)
[facility] EMERGENCY DEPARTMENT Provider Note   CSN: [id number] Arrival date & time: 06/12/20  5176     History No chief complaint on file.   Kyle Montgomery is a 45 y.o. male.  HPI 45 year old male with history of hypertension and bipolar affective disorder presents emergency department for knee pain.  States he twisted his knee 3 days ago.  Had a cute onset left knee soreness that did not radiate.  Standing on it makes it worse, lying down makes it better.  Has been trying Tylenol with only minimal improvement.  Is requesting Ace wrap and crutches.  Does state he has been able to walk okay.  Denies history of injury to this knee previously.    Past Medical History:  Diagnosis Date  . Bipolar affective (HCC)    followed by mental health  . Gynecomastia 02/07   normal pituitary by MRI  . 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 Hypertension   . Renal insufficiency    baseline Cr 2.2 - renal US with increased Echo signal, no nephrology eval, no bx    Patient Active Problem List   Diagnosis Date Noted  . Type 2 diabetes mellitus treated without insulin (HCC) 04/15/2017  . Hyperlipidemia 09/06/2014  . Healthcare maintenance 12/01/2012  . CKD (chronic kidney disease), stage III (HCC) 09/01/2012  . Bipolar affective (HCC) 11/06/2005  . Essential hypertension 11/06/2005  . Gynecomastia 11/06/2005    Past Surgical History:  Procedure Laterality Date  . DENTAL SURGERY         Family History  Problem Relation Age of Onset  . Hyperlipidemia Mother   . Hypertension Mother   . Atrial fibrillation Mother   . Hypertension Brother   . Hypertension Maternal Grandmother   . Cancer Maternal Grandmother     Social History   Tobacco Use  . Smoking status: Former Smoker    Types: Cigarettes    Quit date: 01/22/1983    Years since quitting: 37.4  . Smokeless tobacco: Never Used  Vaping Use  . Vaping Use: Never used   Substance Use Topics  . Alcohol use: No    Alcohol/week: 0.0 standard drinks  . Drug use: No    Home Medications Prior to Admission medications   Medication Sig Start Date End Date Taking? Authorizing Provider  diclofenac Sodium (VOLTAREN) 1 % GEL Apply 2 g topically in the morning, at noon, and at bedtime. 06/12/20  Yes Louretta Parma, DO  acetaminophen (TYLENOL) 325 MG tablet Take 2 tablets (650 mg total) by mouth every 6 (six) hours as needed. 11/25/17   Theotis Barrio, MD  amLODipine (NORVASC) 10 MG tablet Take 1 tablet (10 mg total) by mouth daily. 02/23/20   Belva Agee, MD  atorvastatin (LIPITOR) 40 MG tablet Take 1 tablet (40 mg total) by mouth daily. 02/23/20   Belva Agee, MD  divalproex (DEPAKOTE ER) 500 MG 24 hr tablet Take 1 tablet (500 mg total) by mouth at bedtime. 02/23/20 08/21/20  Belva Agee, MD  lisinopril-hydrochlorothiazide (ZESTORETIC) 20-12.5 MG tablet Take 2 tablets by mouth daily. 02/23/20   Katsadouros, Vasilios, MD  Semaglutide (RYBELSUS) 7 MG TABS Take 7 mg by mouth daily. 02/23/20   Belva Agee, MD  ziprasidone (GEODON) 80 MG capsule Take 1 capsule (80 mg total) by mouth 2 (two) times daily. 02/23/20 08/21/20  Belva Agee, MD    Allergies    Patient has no known allergies.  Review of Systems  Review of Systems  Constitutional: Negative for chills and fever.  HENT: Negative for ear pain and sore throat.   Eyes: Negative for pain and visual disturbance.  Respiratory: Negative for cough and shortness of breath.   Cardiovascular: Negative for chest pain and palpitations.  Gastrointestinal: Negative for abdominal pain and vomiting.  Genitourinary: Negative for dysuria and hematuria.  Musculoskeletal: Positive for arthralgias. Negative for back pain.  Skin: Negative for color change and rash.  Neurological: Negative for seizures and syncope.  All other systems reviewed and are negative.   Physical Exam Updated Vital  Signs BP (!) 137/96 (BP Location: Right Arm)   Pulse (!) 115   Temp 98 F (36.7 C)   Resp 18   SpO2 100%   Physical Exam Vitals and nursing note reviewed.  Constitutional:      Appearance: He is well-developed.  HENT:     Head: Normocephalic and atraumatic.  Eyes:     Conjunctiva/sclera: Conjunctivae normal.  Cardiovascular:     Rate and Rhythm: Normal rate and regular rhythm.  Pulmonary:     Effort: Pulmonary effort is normal.  Abdominal:     Palpations: Abdomen is soft.     Tenderness: There is no abdominal tenderness.  Musculoskeletal:     Cervical back: Neck supple.     Comments: Tenderness along infrapatellar tendon without bulge or deformity.  Extensor mechanism is intact.  Patient has full range of motion in knee when prompted.  Able to ambulate without significant difficulty.  Skin:    General: Skin is warm and dry.     Capillary Refill: Capillary refill takes less than 2 seconds.  Neurological:     General: No focal deficit present.     Mental Status: He is alert.  Psychiatric:        Mood and Affect: Mood normal.        Behavior: Behavior normal.     ED Results / Procedures / Treatments   Labs (all labs ordered are listed, but only abnormal results are displayed) Labs Reviewed - No data to display  EKG None  Radiology DG Knee Complete 4 Views Left  Result Date: 06/12/2020 CLINICAL DATA:  Pain from fall. EXAM: LEFT KNEE - COMPLETE 4+ VIEW COMPARISON:  None. FINDINGS: No evidence of fracture, dislocation, or joint effusion. No evidence of arthropathy or other focal bone abnormality. Soft tissues are unremarkable. IMPRESSION: Negative. Electronically Signed   By: Richarda Overlie M.D.   On: 06/12/2020 08:58    Procedures Procedures   Medications Ordered in ED Medications - No data to display  ED Course  I have reviewed the triage vital signs and the nursing notes.  Pertinent labs & imaging results that were available during my care of the patient were  reviewed by me and considered in my medical decision making (see chart for details).    MDM Rules/Calculators/A&P                          45 year old male presents for left knee pain.  Vital signs are stable, not in acute distress.  Triage vital signs are documented as tachycardic, he was not tachycardic on my exam.  Had a reassuring musculoskeletal exam.  Mild tenderness to palpation along the infrapatellar region.  Was neurovascularly intact.  No signs of disruption to extensor mechanism.  Had good range of motion and was able to ambulate.  Presentation most consistent with knee strain/sprain.  Not consistent with fracture  or dislocation.  Compartments are soft.  Not consistent with significant ligamentous injury.  X-ray shows no acute fracture or dislocation.  I discussed findings with patient.  He feels comfortable going home.  Ace wrap and crutches were provided at his request.  He is able to ambulate well.  He states he is not allowed to take NSAIDs orally due to previous medical history.  Prescribed Voltaren gel for comfort.  He feels comfortable with this plan.  We will follow-up with PCP as needed.  Return precautions given.   Final Clinical Impression(s) / ED Diagnoses Final diagnoses:  Acute pain of left knee    Rx / DC Orders ED Discharge Orders         Ordered    diclofenac Sodium (VOLTAREN) 1 % GEL  3 times daily        06/12/20 0925           Louretta Parma, DO 06/12/20 4818    Tegeler, Canary Brim, MD 06/14/20 2249

## 2020-06-22 ENCOUNTER — Other Ambulatory Visit: Payer: Self-pay

## 2020-06-22 ENCOUNTER — Emergency Department (HOSPITAL_COMMUNITY)
Admission: EM | Admit: 2020-06-22 | Discharge: 2020-06-23 | Disposition: A | Discharge status: Home / Self Care | Payer: Medicaid Other | Attending: Emergency Medicine | Admitting: Emergency Medicine

## 2020-06-22 ENCOUNTER — Encounter (HOSPITAL_COMMUNITY): Payer: Self-pay

## 2020-06-22 DIAGNOSIS — E1122 Type 2 diabetes mellitus with diabetic chronic kidney disease: Secondary | ICD-10-CM | POA: Diagnosis not present

## 2020-06-22 DIAGNOSIS — Z20822 Contact with and (suspected) exposure to covid-19: Secondary | ICD-10-CM | POA: Insufficient documentation

## 2020-06-22 DIAGNOSIS — I129 Hypertensive chronic kidney disease with stage 1 through stage 4 chronic kidney disease, or unspecified chronic kidney disease: Secondary | ICD-10-CM | POA: Diagnosis not present

## 2020-06-22 DIAGNOSIS — N183 Chronic kidney disease, stage 3 unspecified: Secondary | ICD-10-CM | POA: Insufficient documentation

## 2020-06-22 DIAGNOSIS — E1169 Type 2 diabetes mellitus with other specified complication: Secondary | ICD-10-CM | POA: Insufficient documentation

## 2020-06-22 DIAGNOSIS — E785 Hyperlipidemia, unspecified: Secondary | ICD-10-CM | POA: Insufficient documentation

## 2020-06-22 DIAGNOSIS — Z79899 Other long term (current) drug therapy: Secondary | ICD-10-CM | POA: Diagnosis not present

## 2020-06-22 DIAGNOSIS — Z87891 Personal history of nicotine dependence: Secondary | ICD-10-CM | POA: Diagnosis not present

## 2020-06-22 NOTE — ED Triage Notes (Signed)
Just wants to get tested for covid. Was exposed to somebody positive. Denies any symptoms.

## 2020-06-23 LAB — RESP PANEL BY RT-PCR (FLU A&B, COVID) ARPGX2
Influenza A by PCR: NEGATIVE
Influenza B by PCR: NEGATIVE
SARS Coronavirus 2 by RT PCR: NEGATIVE

## 2020-06-23 NOTE — ED Provider Notes (Signed)
[facility] EMERGENCY DEPARTMENT Provider Note   CSN: [id number] Arrival date & time: 06/22/20  2333     History Chief Complaint  Patient presents with  . covid testing    Kyle Montgomery is a 45 y.o. male.  Patient to ED requesting test for COVID secondary to direct exposure to known COVID+ family member. No cough, chest pain. History of HTN, HLD.   The history is provided by the patient. No language interpreter was used.       Past Medical History:  Diagnosis Date  . Bipolar affective (HCC)    followed by mental health  . Gynecomastia 02/07   normal pituitary by MRI  . 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 Hypertension   . Renal insufficiency    baseline Cr 2.2 - renal US with increased Echo signal, no nephrology eval, no bx    Patient Active Problem List   Diagnosis Date Noted  . Type 2 diabetes mellitus treated without insulin (HCC) 04/15/2017  . Hyperlipidemia 09/06/2014  . Healthcare maintenance 12/01/2012  . CKD (chronic kidney disease), stage III (HCC) 09/01/2012  . Bipolar affective (HCC) 11/06/2005  . Essential hypertension 11/06/2005  . Gynecomastia 11/06/2005    Past Surgical History:  Procedure Laterality Date  . DENTAL SURGERY         Family History  Problem Relation Age of Onset  . Hyperlipidemia Mother   . Hypertension Mother   . Atrial fibrillation Mother   . Hypertension Brother   . Hypertension Maternal Grandmother   . Cancer Maternal Grandmother     Social History   Tobacco Use  . Smoking status: Former Smoker    Types: Cigarettes    Quit date: 01/22/1983    Years since quitting: 37.4  . Smokeless tobacco: Never Used  Vaping Use  . Vaping Use: Never used  Substance Use Topics  . Alcohol use: No    Alcohol/week: 0.0 standard drinks  . Drug use: No    Home Medications Prior to Admission medications   Medication Sig Start Date End Date Taking? Authorizing Provider   acetaminophen (TYLENOL) 325 MG tablet Take 2 tablets (650 mg total) by mouth every 6 (six) hours as needed. 11/25/17   Theotis Barrio, MD  amLODipine (NORVASC) 10 MG tablet Take 1 tablet (10 mg total) by mouth daily. 02/23/20   Belva Agee, MD  atorvastatin (LIPITOR) 40 MG tablet Take 1 tablet (40 mg total) by mouth daily. 02/23/20   Belva Agee, MD  diclofenac Sodium (VOLTAREN) 1 % GEL Apply 2 g topically in the morning, at noon, and at bedtime. 06/12/20   Louretta Parma, DO  divalproex (DEPAKOTE ER) 500 MG 24 hr tablet Take 1 tablet (500 mg total) by mouth at bedtime. 02/23/20 08/21/20  Belva Agee, MD  lisinopril-hydrochlorothiazide (ZESTORETIC) 20-12.5 MG tablet Take 2 tablets by mouth daily. 02/23/20   Katsadouros, Vasilios, MD  Semaglutide (RYBELSUS) 7 MG TABS Take 7 mg by mouth daily. 02/23/20   Belva Agee, MD  ziprasidone (GEODON) 80 MG capsule Take 1 capsule (80 mg total) by mouth 2 (two) times daily. 02/23/20 08/21/20  Belva Agee, MD    Allergies    Patient has no known allergies.  Review of Systems   Review of Systems  Constitutional: Negative for chills and fever.  HENT: Negative.   Respiratory: Negative.   Cardiovascular: Negative.   Gastrointestinal: Negative.   Musculoskeletal: Negative.   Skin: Negative.   Neurological: Negative.  Physical Exam Updated Vital Signs BP (!) 154/101 (BP Location: Left Arm)   Pulse 100   Temp 98.7 F (37.1 C)   Resp 17   Ht 5\' 10"  (1.778 m)   Wt 98.2 kg   SpO2 92%   BMI 31.06 kg/m   Physical Exam Vitals and nursing note reviewed.  Constitutional:      Appearance: He is well-developed.  HENT:     Head: Normocephalic.  Cardiovascular:     Rate and Rhythm: Normal rate and regular rhythm.  Pulmonary:     Effort: Pulmonary effort is normal.     Breath sounds: Normal breath sounds.  Abdominal:     General: Bowel sounds are normal.     Palpations: Abdomen is soft.     Tenderness: There  is no abdominal tenderness. There is no guarding or rebound.  Musculoskeletal:        General: Normal range of motion.     Cervical back: Normal range of motion and neck supple.  Skin:    General: Skin is warm and dry.  Neurological:     Mental Status: He is alert and oriented to person, place, and time.     ED Results / Procedures / Treatments   Labs (all labs ordered are listed, but only abnormal results are displayed) Labs Reviewed - No data to display  EKG None  Radiology No results found.  Procedures Procedures   Medications Ordered in ED Medications - No data to display  ED Course  I have reviewed the triage vital signs and the nursing notes.  Pertinent labs & imaging results that were available during my care of the patient were reviewed by me and considered in my medical decision making (see chart for details).    MDM Rules/Calculators/A&P                          Patient here for COVID test secondary to known exposure. Asymptomatic.   Lungs clear, no SOB, CP. COVID collected. Patient can be discharged to await result in MyChart.  Final Clinical Impression(s) / ED Diagnoses Final diagnoses:  None   1. COVID exposure   Rx / DC Orders ED Discharge Orders    None       06/23/20 0004    Mesner, 08/23/20, MD 06/23/20 [id number]

## 2020-06-23 NOTE — Discharge Instructions (Addendum)
The result of your COVID test will be available in MyChart in the next 2-3 hours. If positive, follow the instructions provided for managing your symptoms at home.   Return to the ED with any new or concerning symptoms.

## 2020-07-25 ENCOUNTER — Encounter: Payer: Self-pay | Admitting: *Deleted

## 2020-10-13 ENCOUNTER — Emergency Department (HOSPITAL_COMMUNITY): Payer: Medicaid Other

## 2020-10-13 ENCOUNTER — Inpatient Hospital Stay (HOSPITAL_COMMUNITY)
Admission: EM | Admit: 2020-10-13 | Discharge: 2020-10-19 | DRG: 549 | Disposition: A | Discharge status: Home Health | Payer: Medicaid Other | Attending: Internal Medicine | Admitting: Internal Medicine

## 2020-10-13 ENCOUNTER — Other Ambulatory Visit: Payer: Self-pay

## 2020-10-13 DIAGNOSIS — G8929 Other chronic pain: Secondary | ICD-10-CM | POA: Diagnosis present

## 2020-10-13 DIAGNOSIS — F3189 Other bipolar disorder: Secondary | ICD-10-CM | POA: Diagnosis present

## 2020-10-13 DIAGNOSIS — Z7984 Long term (current) use of oral hypoglycemic drugs: Secondary | ICD-10-CM

## 2020-10-13 DIAGNOSIS — R2689 Other abnormalities of gait and mobility: Secondary | ICD-10-CM | POA: Diagnosis present

## 2020-10-13 DIAGNOSIS — M00852 Arthritis due to other bacteria, left hip: Principal | ICD-10-CM | POA: Diagnosis present

## 2020-10-13 DIAGNOSIS — M2540 Effusion, unspecified joint: Secondary | ICD-10-CM

## 2020-10-13 DIAGNOSIS — I129 Hypertensive chronic kidney disease with stage 1 through stage 4 chronic kidney disease, or unspecified chronic kidney disease: Secondary | ICD-10-CM | POA: Diagnosis present

## 2020-10-13 DIAGNOSIS — Z91120 Patient's intentional underdosing of medication regimen due to financial hardship: Secondary | ICD-10-CM

## 2020-10-13 DIAGNOSIS — N179 Acute kidney failure, unspecified: Secondary | ICD-10-CM | POA: Diagnosis present

## 2020-10-13 DIAGNOSIS — E1142 Type 2 diabetes mellitus with diabetic polyneuropathy: Secondary | ICD-10-CM | POA: Diagnosis present

## 2020-10-13 DIAGNOSIS — Z8249 Family history of ischemic heart disease and other diseases of the circulatory system: Secondary | ICD-10-CM

## 2020-10-13 DIAGNOSIS — Z20822 Contact with and (suspected) exposure to covid-19: Secondary | ICD-10-CM | POA: Diagnosis present

## 2020-10-13 DIAGNOSIS — M25552 Pain in left hip: Secondary | ICD-10-CM | POA: Diagnosis present

## 2020-10-13 DIAGNOSIS — Z23 Encounter for immunization: Secondary | ICD-10-CM

## 2020-10-13 DIAGNOSIS — W19XXXA Unspecified fall, initial encounter: Secondary | ICD-10-CM | POA: Diagnosis present

## 2020-10-13 DIAGNOSIS — N1832 Chronic kidney disease, stage 3b: Secondary | ICD-10-CM | POA: Diagnosis present

## 2020-10-13 DIAGNOSIS — Z79899 Other long term (current) drug therapy: Secondary | ICD-10-CM

## 2020-10-13 DIAGNOSIS — Z83438 Family history of other disorder of lipoprotein metabolism and other lipidemia: Secondary | ICD-10-CM

## 2020-10-13 DIAGNOSIS — Z87891 Personal history of nicotine dependence: Secondary | ICD-10-CM

## 2020-10-13 DIAGNOSIS — E785 Hyperlipidemia, unspecified: Secondary | ICD-10-CM | POA: Diagnosis present

## 2020-10-13 DIAGNOSIS — E1122 Type 2 diabetes mellitus with diabetic chronic kidney disease: Secondary | ICD-10-CM | POA: Diagnosis present

## 2020-10-13 DIAGNOSIS — M009 Pyogenic arthritis, unspecified: Secondary | ICD-10-CM | POA: Diagnosis present

## 2020-10-13 LAB — CBC WITH DIFFERENTIAL/PLATELET
Abs Immature Granulocytes: 0.05 10*3/uL (ref 0.00–0.07)
Basophils Absolute: 0.1 10*3/uL (ref 0.0–0.1)
Basophils Relative: 1 %
Eosinophils Absolute: 0 10*3/uL (ref 0.0–0.5)
Eosinophils Relative: 0 %
HCT: 40.4 % (ref 39.0–52.0)
Hemoglobin: 13.1 g/dL (ref 13.0–17.0)
Immature Granulocytes: 1 %
Lymphocytes Relative: 16 %
Lymphs Abs: 1.6 10*3/uL (ref 0.7–4.0)
MCH: 28.2 pg (ref 26.0–34.0)
MCHC: 32.4 g/dL (ref 30.0–36.0)
MCV: 87.1 fL (ref 80.0–100.0)
Monocytes Absolute: 1 10*3/uL (ref 0.1–1.0)
Monocytes Relative: 10 %
Neutro Abs: 7.7 10*3/uL (ref 1.7–7.7)
Neutrophils Relative %: 72 %
Platelets: 272 10*3/uL (ref 150–400)
RBC: 4.64 MIL/uL (ref 4.22–5.81)
RDW: 14.1 % (ref 11.5–15.5)
WBC: 10.5 10*3/uL (ref 4.0–10.5)
nRBC: 0 % (ref 0.0–0.2)

## 2020-10-13 LAB — COMPREHENSIVE METABOLIC PANEL
ALT: 40 U/L (ref 0–44)
AST: 32 U/L (ref 15–41)
Albumin: 3.9 g/dL (ref 3.5–5.0)
Alkaline Phosphatase: 48 U/L (ref 38–126)
Anion gap: 11 (ref 5–15)
BUN: 36 mg/dL — ABNORMAL HIGH (ref 6–20)
CO2: 25 mmol/L (ref 22–32)
Calcium: 9.9 mg/dL (ref 8.9–10.3)
Chloride: 104 mmol/L (ref 98–111)
Creatinine, Ser: 2.38 mg/dL — ABNORMAL HIGH (ref 0.61–1.24)
GFR, Estimated: 33 mL/min — ABNORMAL LOW (ref >60)
Glucose, Bld: 128 mg/dL — ABNORMAL HIGH (ref 70–99)
Potassium: 3.7 mmol/L (ref 3.5–5.1)
Sodium: 140 mmol/L (ref 135–145)
Total Bilirubin: 0.9 mg/dL (ref 0.3–1.2)
Total Protein: 8.4 g/dL — ABNORMAL HIGH (ref 6.5–8.1)

## 2020-10-13 LAB — C-REACTIVE PROTEIN: CRP: 11 mg/dL — ABNORMAL HIGH (ref <1.0)

## 2020-10-13 LAB — SEDIMENTATION RATE: Sed Rate: 45 mm/hr — ABNORMAL HIGH (ref 0–16)

## 2020-10-13 MED ORDER — ENOXAPARIN SODIUM 40 MG/0.4ML IJ SOSY
40.0000 mg | PREFILLED_SYRINGE | INTRAMUSCULAR | Status: DC
  Administered 2020-10-14 – 2020-10-19 (×6): 40 mg via SUBCUTANEOUS
  Filled 2020-10-13 (×6): qty 0.4

## 2020-10-13 MED ORDER — KETOROLAC TROMETHAMINE 60 MG/2ML IM SOLN: 60.0000 mg | Freq: Once | INTRAMUSCULAR | Status: DC

## 2020-10-13 MED ORDER — ACETAMINOPHEN 325 MG PO TABS
650.0000 mg | ORAL_TABLET | Freq: Four times a day (QID) | ORAL | Status: DC | PRN
  Administered 2020-10-14 – 2020-10-19 (×4): 650 mg via ORAL
  Filled 2020-10-13 (×4): qty 2

## 2020-10-13 MED ORDER — MELATONIN 3 MG PO TABS
3.0000 mg | ORAL_TABLET | Freq: Every evening | ORAL | Status: DC | PRN
  Administered 2020-10-18 (×2): 3 mg via ORAL
  Filled 2020-10-13 (×2): qty 1

## 2020-10-13 MED ORDER — SODIUM CHLORIDE 0.9 % IV SOLN
2.0000 g | INTRAVENOUS | Status: DC
  Administered 2020-10-14 – 2020-10-17 (×4): 2 g via INTRAVENOUS
  Filled 2020-10-13 (×4): qty 20

## 2020-10-13 MED ORDER — INSULIN ASPART 100 UNIT/ML IJ SOLN: 0.0000 [IU] | Freq: Every day | INTRAMUSCULAR | Status: DC

## 2020-10-13 MED ORDER — INSULIN ASPART 100 UNIT/ML IJ SOLN
0.0000 [IU] | Freq: Three times a day (TID) | INTRAMUSCULAR | Status: DC
  Administered 2020-10-16: 1 [IU] via SUBCUTANEOUS
  Administered 2020-10-18: 2 [IU] via SUBCUTANEOUS
  Administered 2020-10-19: 1 [IU] via SUBCUTANEOUS

## 2020-10-13 MED ORDER — HYDROMORPHONE HCL 1 MG/ML IJ SOLN
0.5000 mg | INTRAMUSCULAR | Status: DC | PRN
Start: 2020-10-13 — End: 2020-10-19

## 2020-10-13 MED ORDER — OXYCODONE HCL 5 MG PO TABS
5.0000 mg | ORAL_TABLET | Freq: Four times a day (QID) | ORAL | Status: DC | PRN
  Administered 2020-10-14 – 2020-10-19 (×14): 5 mg via ORAL
  Filled 2020-10-13 (×14): qty 1

## 2020-10-13 MED ORDER — POLYETHYLENE GLYCOL 3350 17 G PO PACK: 17.0000 g | PACK | Freq: Every day | ORAL | Status: DC | PRN

## 2020-10-13 MED ORDER — SODIUM CHLORIDE 0.9 % IV SOLN
2.0000 g | Freq: Once | INTRAVENOUS | Status: AC
  Administered 2020-10-14: 2 g via INTRAVENOUS
  Filled 2020-10-13: qty 20

## 2020-10-13 MED ORDER — VANCOMYCIN HCL 2000 MG/400ML IV SOLN
2000.0000 mg | Freq: Once | INTRAVENOUS | Status: AC
  Administered 2020-10-14: 2000 mg via INTRAVENOUS
  Filled 2020-10-13: qty 400

## 2020-10-13 MED ORDER — MORPHINE SULFATE (PF) 4 MG/ML IV SOLN
4.0000 mg | Freq: Once | INTRAVENOUS | Status: AC
  Administered 2020-10-13: 4 mg via INTRAVENOUS
  Filled 2020-10-13: qty 1

## 2020-10-13 MED ORDER — VANCOMYCIN VARIABLE DOSE PER UNSTABLE RENAL FUNCTION (PHARMACIST DOSING): Status: DC

## 2020-10-13 MED ORDER — SODIUM CHLORIDE 0.9 % IV BOLUS
1000.0000 mL | Freq: Once | INTRAVENOUS | Status: AC
  Administered 2020-10-13: 1000 mL via INTRAVENOUS

## 2020-10-13 NOTE — ED Triage Notes (Signed)
Pt from home for eval of intermittent L hip pain without injury x 1 month, worse after exertion. Pt has been seen in this ED and at an orthopedist for same and has been dx with arthritis and is taking Tylenol Arthritis without relief.

## 2020-10-13 NOTE — ED Provider Notes (Signed)
Emergency Medicine Provider Triage Evaluation Note  Kyle Montgomery , a 45 y.o. male  was evaluated in triage.  Pt complains of left hip pain.  Patient has had ongoing pain in the left hip and seen here for this in the past.  He states he followed up with an orthopedic doctor who told him he has arthritis.  He states pain is at its worse with moving or exercising.  He has been taking Tylenol, but states that over the last several days it has not been helpful.  Review of Systems  Positive: As above Negative: As above  Physical Exam  BP 116/83 (BP Location: Left Arm)   Pulse (!) 112   Temp 100.1 F (37.8 C) (Oral)   Resp 18   SpO2 97%  Gen:   Awake, no distress   Resp:  Normal effort  MSK:   Moves extremities without difficulty  Other:  Full flexion extension of the left hip, though patient is slow to move the hip  Medical Decision Making  Medically screening exam initiated at 1:47 PM.  Appropriate orders placed.  Kyle Montgomery was informed that the remainder of the evaluation will be completed by another provider, this initial triage assessment does not replace that evaluation, and the importance of remaining in the ED until their evaluation is complete.     Kyle Ferrari, PA-C 10/13/20 1350    Kyle Dada P, DO 10/15/20 1157

## 2020-10-13 NOTE — ED Provider Notes (Signed)
[facility] EMERGENCY DEPARTMENT Provider Note   CSN: [id number] Arrival date & time: 10/13/20  1333     History Chief Complaint  Patient presents with   Hip Pain    Kyle Montgomery is a 45 y.o. male with PMHx HTN, HLD, T2DM, CKD who presents for evaluation of left hip pain.    Who presents for evaluation of a 3-day history of left hip pain.  He states that symptoms began after he took an extended walk with his mother 3 days ago.  In the interim, he has had gradually worsening left hip pain, as well as difficulty bearing weight on that limb.  Movement and weightbearing worsen the pain.  No visible joint swelling or redness.  He states that he has been using crutches to ambulate.  He has been taking Tylenol with mild improvement.  He denies any recent fevers or chills.  No other recent traumatic events.  Otherwise, he states that he has been in his normal state of health over the preceding days.    Past Medical History:  Diagnosis Date   Bipolar affective ([place])    followed by mental health   Gynecomastia 02/07   normal pituitary by MRI   Hyperprolactinemia ([place]) 09/27/04   felt 2/2 lithium and fluphenazine   Hypertension    Renal insufficiency    baseline Cr 2.2 - renal US with increased Echo signal, no nephrology eval, no bx    Patient Active Problem List   Diagnosis Date Noted   Left hip pain 10/13/2020   Type 2 diabetes mellitus treated without insulin ([place]) 04/15/2017   Hyperlipidemia 09/06/2014   Healthcare maintenance 12/01/2012   CKD (chronic kidney disease), stage III ([place]) 09/01/2012   Bipolar affective ([place]) 11/06/2005   Essential hypertension 11/06/2005   Gynecomastia 11/06/2005    Past Surgical History:  Procedure Laterality Date   DENTAL SURGERY         Family History  Problem Relation Age of Onset   Hyperlipidemia Mother    Hypertension Mother    Atrial fibrillation Mother    Hypertension Brother    Hypertension Maternal  Grandmother    Cancer Maternal Grandmother     Social History   Tobacco Use   Smoking status: Former    Types: Cigarettes    Quit date: 01/22/1983    Years since quitting: 37.7   Smokeless tobacco: Never  Vaping Use   Vaping Use: Never used  Substance Use Topics   Alcohol use: No    Alcohol/week: 0.0 standard drinks   Drug use: No    Home Medications Prior to Admission medications   Medication Sig Start Date End Date Taking? Authorizing Provider  acetaminophen (TYLENOL) 325 MG tablet Take 2 tablets (650 mg total) by mouth every 6 (six) hours as needed. 11/25/17   Mosetta Anis, MD  amLODipine (NORVASC) 10 MG tablet Take 1 tablet (10 mg total) by mouth daily. 02/23/20   Riesa Pope, MD  atorvastatin (LIPITOR) 40 MG tablet Take 1 tablet (40 mg total) by mouth daily. 02/23/20   Riesa Pope, MD  diclofenac Sodium (VOLTAREN) 1 % GEL Apply 2 g topically in the morning, at noon, and at bedtime. 06/12/20   Suzan Nailer, DO  divalproex (DEPAKOTE ER) 500 MG 24 hr tablet Take 1 tablet (500 mg total) by mouth at bedtime. 02/23/20 08/21/20  Riesa Pope, MD  lisinopril-hydrochlorothiazide (ZESTORETIC) 20-12.5 MG tablet Take 2 tablets by mouth daily. 02/23/20   Riesa Pope, MD  Semaglutide (RYBELSUS) 7 MG TABS Take 7 mg by mouth daily. 02/23/20   Riesa Pope, MD  ziprasidone (GEODON) 80 MG capsule Take 1 capsule (80 mg total) by mouth 2 (two) times daily. 02/23/20 08/21/20  Riesa Pope, MD    Allergies    Patient has no known allergies.  Review of Systems   Review of Systems  Constitutional:  Negative for chills and fever.  HENT:  Negative for ear pain and sore throat.   Eyes:  Negative for pain and visual disturbance.  Respiratory:  Negative for cough and shortness of breath.   Cardiovascular:  Negative for chest pain and palpitations.  Gastrointestinal:  Negative for abdominal pain and vomiting.  Genitourinary:  Negative for dysuria and  hematuria.  Musculoskeletal:  Positive for arthralgias and gait problem. Negative for back pain.  Skin:  Negative for color change and rash.  Neurological:  Negative for seizures and syncope.  All other systems reviewed and are negative.  Physical Exam Updated Vital Signs BP 118/78 (BP Location: Left Arm)   Pulse 89   Temp 99 F (37.2 C) (Oral)   Resp 14   SpO2 97%   Physical Exam Vitals and nursing note reviewed.  Constitutional:      Appearance: He is well-developed.  HENT:     Head: Normocephalic and atraumatic.  Eyes:     Conjunctiva/sclera: Conjunctivae normal.  Cardiovascular:     Rate and Rhythm: Normal rate and regular rhythm.     Heart sounds: No murmur heard. Pulmonary:     Effort: Pulmonary effort is normal. No respiratory distress.     Breath sounds: Normal breath sounds.  Abdominal:     Palpations: Abdomen is soft.     Tenderness: There is no abdominal tenderness.  Musculoskeletal:        General: Tenderness present. No deformity.     Cervical back: Neck supple.     Comments: No visible joint swelling or redness of the hips.  No midline spinal tenderness to palpation.  No erythema of the back.  Pain with minimal range of motion of left hip.  Neurovascularly intact.  Skin:    General: Skin is warm and dry.     Capillary Refill: Capillary refill takes less than 2 seconds.  Neurological:     General: No focal deficit present.     Mental Status: He is alert and oriented to person, place, and time. Mental status is at baseline.     Cranial Nerves: No cranial nerve deficit.     Sensory: No sensory deficit.     Motor: No weakness.    ED Results / Procedures / Treatments   Labs (all labs ordered are listed, but only abnormal results are displayed) Labs Reviewed  COMPREHENSIVE METABOLIC PANEL - Abnormal; Notable for the following components:      Result Value   Glucose, Bld 128 (*)    BUN 36 (*)    Creatinine, Ser 2.38 (*)    Total Protein 8.4 (*)    GFR,  Estimated 33 (*)    All other components within normal limits  SEDIMENTATION RATE - Abnormal; Notable for the following components:   Sed Rate 45 (*)    All other components within normal limits  C-REACTIVE PROTEIN - Abnormal; Notable for the following components:   CRP 11.0 (*)    All other components within normal limits  HEMOGLOBIN A1C - Abnormal; Notable for the following components:   Hgb A1c MFr Bld 5.9 (*)  All other components within normal limits  CBG MONITORING, ED - Abnormal; Notable for the following components:   Glucose-Capillary 116 (*)    All other components within normal limits  CULTURE, BLOOD (ROUTINE X 2)  CULTURE, BLOOD (ROUTINE X 2)  SARS CORONAVIRUS 2 (TAT 6-24 HRS)  CBC WITH DIFFERENTIAL/PLATELET  HIV ANTIBODY (ROUTINE TESTING W REFLEX)  CBC  COMPREHENSIVE METABOLIC PANEL  MAGNESIUM  PHOSPHORUS    EKG None  Radiology CT PELVIS WO CONTRAST  Result Date: 10/13/2020 CLINICAL DATA:  Pelvic fracture hx of bone lesion, has L hip pain EXAM: CT PELVIS WITHOUT CONTRAST TECHNIQUE: Multidetector CT imaging of the pelvis was performed following the standard protocol without intravenous contrast. COMPARISON:  Radiographs earlier today.  Pelvis CT 03/31/2016 FINDINGS: Urinary Tract: Distal ureters are decompressed. Unremarkable urinary bladder. Bowel: Moderate stool in the rectosigmoid colon. Normal appendix. No bowel inflammation. Vascular/Lymphatic: No pelvic adenopathy. Atherosclerosis of the iliac arteries. Reproductive:  Unremarkable prostate. Other:  Bilateral fat containing inguinal hernias. Musculoskeletal: Sclerotic focus in the left iliac bone measures 2.6 x 1.2 cm, previously 2.4 x 1.3 cm on 2018 exam. Margins are lobulated. There is some adjacent sclerosis of the left sacroiliac joint with sacroiliac joint erosions. It is unclear if this lesion is related to the sacroiliac joint changes or there is separate sacroiliitis. No fracture is seen. No new or other  focal bone lesions. IMPRESSION: 1. Sclerotic focus in the left iliac bone has marginally increased in size over the past 2 years. There is some adjacent sclerosis of the left sacroiliac joint with sacroiliac joint erosions. It is unclear if this lesion is related to the sacroiliac joint changes or there is separate sacroiliitis. Recommend clinical correlation for signs or symptoms of inflammatory arthropathy. 2. No fracture. 3. Bilateral fat containing inguinal hernias. Aortic Atherosclerosis (ICD10-I70.0). Electronically Signed   By: Keith Rake M.D.   On: 10/13/2020 22:09   DG Hip Unilat W or Wo Pelvis 2-3 Views Left  Result Date: 10/13/2020 CLINICAL DATA:  Severe left hip pain for 3 days, no known injury EXAM: DG HIP (WITH OR WITHOUT PELVIS) 2-3V LEFT COMPARISON:  CT pelvis, 03/31/2016 FINDINGS: There is no evidence of hip fracture or dislocation. There is no evidence of arthropathy. Unchanged sclerotic lesion of the left ilium, previously characterized as a benign bone island. IMPRESSION: No fracture or dislocation of the left hip. Joint spaces are preserved. Electronically Signed   By: Eddie Candle M.D.   On: 10/13/2020 14:53    Procedures Procedures   Medications Ordered in ED Medications  vancomycin (VANCOREADY) IVPB 2000 mg/400 mL (2,000 mg Intravenous New Bag/Given 10/14/20 0200)  vancomycin variable dose per unstable renal function (pharmacist dosing) (has no administration in time range)  enoxaparin (LOVENOX) injection 40 mg (has no administration in time range)  HYDROmorphone (DILAUDID) injection 0.5 mg (has no administration in time range)  oxyCODONE (Oxy IR/ROXICODONE) immediate release tablet 5 mg (has no administration in time range)  polyethylene glycol (MIRALAX / GLYCOLAX) packet 17 g (has no administration in time range)  acetaminophen (TYLENOL) tablet 650 mg (has no administration in time range)  melatonin tablet 3 mg (has no administration in time range)  cefTRIAXone  (ROCEPHIN) 2 g in sodium chloride 0.9 % 100 mL IVPB (has no administration in time range)  insulin aspart (novoLOG) injection 0-9 Units (has no administration in time range)  insulin aspart (novoLOG) injection 0-5 Units (0 Units Subcutaneous Not Given 10/14/20 0209)  atorvastatin (LIPITOR) tablet 40 mg (has no  administration in time range)  divalproex (DEPAKOTE ER) 24 hr tablet 500 mg (has no administration in time range)  ziprasidone (GEODON) capsule 80 mg (has no administration in time range)  lactated ringers infusion ( Intravenous New Bag/Given 10/14/20 0209)  morphine 4 MG/ML injection 4 mg (4 mg Intravenous Given 10/13/20 2146)  sodium chloride 0.9 % bolus 1,000 mL (1,000 mLs Intravenous New Bag/Given 10/13/20 2147)  cefTRIAXone (ROCEPHIN) 2 g in sodium chloride 0.9 % 100 mL IVPB (0 g Intravenous Stopped 10/14/20 0124)    ED Course  I have reviewed the triage vital signs and the nursing notes.  Pertinent labs & imaging results that were available during my care of the patient were reviewed by me and considered in my medical decision making (see chart for details).    MDM Rules/Calculators/A&P                           45 y.o. male with past medical history as above who presents for evaluation of left hip pain. Borderline febrile and hemodynamically stable, but tachycardic.  Exam as detailed above.   CBC without leukocytosis.  ESR and CRP are marginally elevated at 45 and 11 respectively.  CMP with mild AKI on CKD, with creatinine 2.38 from baseline 2.0.  Left hip x-ray with no acute fracture or dislocation of the left hip.  CT of the pelvis demonstrates sclerotic focus in the left iliac bone which has marginally increased in size over the past 2 years.  There is also potentially a component of sacroiliitis.  No evidence of fracture.    Presentation is concerning for septic arthritis given SIRS criteria (fever and tachycardia), hip pain, and CT findings.  Case was discussed with orthopedic  surgery, who recommends IR joint aspiration.  Broad-spectrum antibiotics were initiated to cover for possible septic arthritis, with Rocephin and Vancomycin.  Hospitalist has agreed to admit for further management.  Final Clinical Impression(s) / ED Diagnoses Final diagnoses:  Joint effusion    Rx / DC Orders ED Discharge Orders     None        Violet Baldy, MD 10/14/20 1308    Drenda Freeze, MD 10/15/20 1622

## 2020-10-13 NOTE — H&P (Addendum)
History and Physical  Kyle Montgomery NUU:[id number] DOB: 1975/07/09 DOA: 10/13/2020  Referring physician: Dr. Holley Dexter, EDP. PCP: Andrey Campanile, MD  Outpatient Specialists: Orthopedic surgery. Patient coming from: Home.  Chief Complaint: Left hip pain.  HPI: Kyle Montgomery is a 45 y.o. male with medical history significant for type 2 diabetes, hypertension, hyperlipidemia, bipolar disorder, CKD 3B, who presented to [facility] ED with complaints of left hip pain worse for the past 3 days.  States he has had left hip pain, off and on for at least a year.  The pain migrates from left hip to left knee to left ankle but mostly on the left side.  No chills or night sweats.  Associative difficulty with ambulation.  No cardiopulmonary or GI symptoms.  He has presented to the ED for the past 3 years or so for the same complaints.  He is accompanied by his mother who assists with providing a history.  Work-up in the ED revealed elevated CRP 11.0, sed rate 45, CT scan left pelvis revealed sclerotic focus in the left iliac bone has marginally increased in size over the past 2 years.  There is some adjacent sclerosis of the left sacroiliac joint with sacroiliac joint erosions.  Unclear if this lesion is related to the sacroiliac joint changes or there is separate sacroiliitis.  No fracture.  Bilateral fat-containing inguinal hernias.  T-max 100.2 in the ED, tachycardic.  Due to concern for septic joint, EDP started IV antibiotics empirically Rocephin and IV vancomycin.  Patient admitted under hospitalist service.  ED Course:  T-max 100.2.  BP 114/92, heart rate 110, respiratory 16, O2 saturation 95-100% on room air.  Lab studies remarkable for creatinine 2.38 from baseline of 2.08 in August 12, 2019.  CRP 11.0.  Sed rate 45.  Review of Systems: Review of systems as noted in the HPI. All other systems reviewed and are negative.   Past Medical History:  Diagnosis Date   Bipolar affective ([facility])    followed by  mental health   Gynecomastia 02/07   normal pituitary by MRI   Hyperprolactinemia ([facility]) 09/27/04   felt 2/2 lithium and fluphenazine   Hypertension    Renal insufficiency    baseline Cr 2.2 - renal US with increased Echo signal, no nephrology eval, no bx   Past Surgical History:  Procedure Laterality Date   DENTAL SURGERY      Social History:  reports that he quit smoking about 37 years ago. His smoking use included cigarettes. He has never used smokeless tobacco. He reports that he does not drink alcohol and does not use drugs.   No Known Allergies  Family History  Problem Relation Age of Onset   Hyperlipidemia Mother    Hypertension Mother    Atrial fibrillation Mother    Hypertension Brother    Hypertension Maternal Grandmother    Cancer Maternal Grandmother       Prior to Admission medications   Medication Sig Start Date End Date Taking? Authorizing Provider  acetaminophen (TYLENOL) 325 MG tablet Take 2 tablets (650 mg total) by mouth every 6 (six) hours as needed. 11/25/17   Theotis Barrio, MD  amLODipine (NORVASC) 10 MG tablet Take 1 tablet (10 mg total) by mouth daily. 02/23/20   Belva Agee, MD  atorvastatin (LIPITOR) 40 MG tablet Take 1 tablet (40 mg total) by mouth daily. 02/23/20   Belva Agee, MD  diclofenac Sodium (VOLTAREN) 1 % GEL Apply 2 g topically in the morning,  at noon, and at bedtime. 06/12/20   Louretta Parma, DO  divalproex (DEPAKOTE ER) 500 MG 24 hr tablet Take 1 tablet (500 mg total) by mouth at bedtime. 02/23/20 08/21/20  Belva Agee, MD  lisinopril-hydrochlorothiazide (ZESTORETIC) 20-12.5 MG tablet Take 2 tablets by mouth daily. 02/23/20   Katsadouros, Vasilios, MD  Semaglutide (RYBELSUS) 7 MG TABS Take 7 mg by mouth daily. 02/23/20   Belva Agee, MD  ziprasidone (GEODON) 80 MG capsule Take 1 capsule (80 mg total) by mouth 2 (two) times daily. 02/23/20 08/21/20  Belva Agee, MD    Physical Exam: BP (!) 114/92    Pulse (!) 106   Temp 100.2 F (37.9 C) (Oral)   Resp 16   SpO2 95%   General: 45 y.o. year-old male well developed well nourished in no acute distress.  Alert and oriented x3. Cardiovascular: Regular rate and rhythm with no rubs or gallops.  No thyromegaly or JVD noted.  No lower extremity edema. 2/4 pulses in all 4 extremities. Respiratory: Clear to auscultation with no wheezes or rales. Good inspiratory effort. Abdomen: Soft nontender nondistended with normal bowel sounds x4 quadrants. Muskuloskeletal: No cyanosis, clubbing or edema noted bilaterally Neuro: CN II-XII intact, strength, sensation, reflexes Skin: No ulcerative lesions noted or rashes Psychiatry: Judgement and insight appear normal. Mood is appropriate for condition and setting          Labs on Admission:  Basic Metabolic Panel: Recent Labs  Lab 10/13/20 2030  NA 140  K 3.7  CL 104  CO2 25  GLUCOSE 128*  BUN 36*  CREATININE 2.38*  CALCIUM 9.9   Liver Function Tests: Recent Labs  Lab 10/13/20 2030  AST 32  ALT 40  ALKPHOS 48  BILITOT 0.9  PROT 8.4*  ALBUMIN 3.9   No results for input(s): LIPASE, AMYLASE in the last 168 hours. No results for input(s): AMMONIA in the last 168 hours. CBC: Recent Labs  Lab 10/13/20 2030  WBC 10.5  NEUTROABS 7.7  HGB 13.1  HCT 40.4  MCV 87.1  PLT 272   Cardiac Enzymes: No results for input(s): CKTOTAL, CKMB, CKMBINDEX, TROPONINI in the last 168 hours.  BNP (last 3 results) No results for input(s): BNP in the last 8760 hours.  ProBNP (last 3 results) No results for input(s): PROBNP in the last 8760 hours.  CBG: No results for input(s): GLUCAP in the last 168 hours.  Radiological Exams on Admission: CT PELVIS WO CONTRAST  Result Date: 10/13/2020 CLINICAL DATA:  Pelvic fracture hx of bone lesion, has L hip pain EXAM: CT PELVIS WITHOUT CONTRAST TECHNIQUE: Multidetector CT imaging of the pelvis was performed following the standard protocol without intravenous  contrast. COMPARISON:  Radiographs earlier today.  Pelvis CT 03/31/2016 FINDINGS: Urinary Tract: Distal ureters are decompressed. Unremarkable urinary bladder. Bowel: Moderate stool in the rectosigmoid colon. Normal appendix. No bowel inflammation. Vascular/Lymphatic: No pelvic adenopathy. Atherosclerosis of the iliac arteries. Reproductive:  Unremarkable prostate. Other:  Bilateral fat containing inguinal hernias. Musculoskeletal: Sclerotic focus in the left iliac bone measures 2.6 x 1.2 cm, previously 2.4 x 1.3 cm on 2018 exam. Margins are lobulated. There is some adjacent sclerosis of the left sacroiliac joint with sacroiliac joint erosions. It is unclear if this lesion is related to the sacroiliac joint changes or there is separate sacroiliitis. No fracture is seen. No new or other focal bone lesions. IMPRESSION: 1. Sclerotic focus in the left iliac bone has marginally increased in size over the past 2 years. There is some  adjacent sclerosis of the left sacroiliac joint with sacroiliac joint erosions. It is unclear if this lesion is related to the sacroiliac joint changes or there is separate sacroiliitis. Recommend clinical correlation for signs or symptoms of inflammatory arthropathy. 2. No fracture. 3. Bilateral fat containing inguinal hernias. Aortic Atherosclerosis (ICD10-I70.0). Electronically Signed   By: Narda Rutherford M.D.   On: 10/13/2020 22:09   DG Hip Unilat W or Wo Pelvis 2-3 Views Left  Result Date: 10/13/2020 CLINICAL DATA:  Severe left hip pain for 3 days, no known injury EXAM: DG HIP (WITH OR WITHOUT PELVIS) 2-3V LEFT COMPARISON:  CT pelvis, 03/31/2016 FINDINGS: There is no evidence of hip fracture or dislocation. There is no evidence of arthropathy. Unchanged sclerotic lesion of the left ilium, previously characterized as a benign bone island. IMPRESSION: No fracture or dislocation of the left hip. Joint spaces are preserved. Electronically Signed   By: Lauralyn Primes M.D.   On: 10/13/2020  14:53    EKG: I independently viewed the EKG done and my findings are as followed: None available at the time of this visit.  Assessment/Plan Present on Admission:  Left hip pain  Active Problems:   Left hip pain  Left hip pain, rule out septic arthritis CT scan left pelvis revealed sclerotic focus in the left iliac bone has marginally increased in size over the past 2 years.  There is some adjacent sclerosis of the left sacroiliac joint with sacroiliac joint erosions. CRP 11.0 Sed rate 45. Pain control IR consulted for possible joint aspiration Follow blood cultures x2 peripherally Started on Rocephin and IV vancomycin in the ED, continue.  AKI on CKD 3B, suspect prerenal in the setting of dehydration Baseline creatinine appears to be 2.08 with GFR 43 Presented with creatinine of 2.38 with GFR of 33 Start gentle IV fluid hydration LR at 50 cc/hr x 1 day Avoid nephrotoxic agents, dehydration and hypotension Closely monitor urine output. Repeat BMP in the morning  Hypertension BP is currently at goal. Resume home oral antihypertensives.  Tachycardia, suspected in setting of dehydration Heart rate 107 from 110.  Hyperlipidemia Resume home statin  Type 2 diabetes with hyperlipidemia Obtain hemoglobin A1c in the morning Start Insulin sliding scale  Bipolar disorder Resume home Depakote and Geodon  Ambulatory dysfunction PT OT assessment Fall precautions   DVT prophylaxis: Subcu Lovenox daily.  Code Status: Full code.  Family Communication: Patient's mother at bedside.  All questions answered to the best of my ability.  Disposition Plan: Admitted to MedSurg unit with remote telemetry.  Consults called: Orthopedic surgery consulted by EDP.  IR consulted for possible joint aspiration.  Admission status: Observation status.   Status is: Observation    Dispo:  Patient From:  Home  Planned Disposition:  Home  Medically stable for discharge:  No        Darlin Drop MD [facility] Pager [phone]  If 7PM-7AM, please contact night-coverage [email or web address] Password [facility]  10/13/2020, 11:34 PM

## 2020-10-13 NOTE — Progress Notes (Addendum)
Pharmacy Antibiotic Note  Kyle Montgomery is a 45 y.o. male admitted on 10/13/2020 with  septic arthritis .  Pharmacy has been consulted for vancomycin dosing.  Patient's serum creatinine is 2.38 unclear if this is baseline at this point. WBC 10.5  Plan: Rocephin per MD Vancomycin 2000 mg once, subsequent dosing as indicated per random vancomycin level until renal function stable and/or improved, at which time scheduled dosing can be considered Follow-up cultures and de-escalate antibiotics as appropriate.     Temp (24hrs), Avg:100.2 F (37.9 C), Min:100.1 F (37.8 C), Max:100.2 F (37.9 C)  Recent Labs  Lab 10/13/20 2030  WBC 10.5  CREATININE 2.38*    CrCl cannot be calculated (Unknown ideal weight.).    No Known Allergies  Antimicrobials this admission: ceftriaxone 9/23 >>  vancomycin 9/23 >>   Microbiology results: Pending  Thank you for allowing pharmacy to be a part of this patient's care.  Delmar Landau, PharmD, BCPS 10/13/2020 11:08 PM ED Clinical Pharmacist -  [phone]

## 2020-10-14 ENCOUNTER — Observation Stay (HOSPITAL_COMMUNITY): Payer: Medicaid Other

## 2020-10-14 ENCOUNTER — Encounter (HOSPITAL_COMMUNITY): Payer: Self-pay | Admitting: Internal Medicine

## 2020-10-14 DIAGNOSIS — I129 Hypertensive chronic kidney disease with stage 1 through stage 4 chronic kidney disease, or unspecified chronic kidney disease: Secondary | ICD-10-CM | POA: Diagnosis present

## 2020-10-14 DIAGNOSIS — Z79899 Other long term (current) drug therapy: Secondary | ICD-10-CM | POA: Diagnosis not present

## 2020-10-14 DIAGNOSIS — G8929 Other chronic pain: Secondary | ICD-10-CM | POA: Diagnosis present

## 2020-10-14 DIAGNOSIS — Z83438 Family history of other disorder of lipoprotein metabolism and other lipidemia: Secondary | ICD-10-CM | POA: Diagnosis not present

## 2020-10-14 DIAGNOSIS — E1142 Type 2 diabetes mellitus with diabetic polyneuropathy: Secondary | ICD-10-CM | POA: Diagnosis present

## 2020-10-14 DIAGNOSIS — Z87891 Personal history of nicotine dependence: Secondary | ICD-10-CM | POA: Diagnosis not present

## 2020-10-14 DIAGNOSIS — N1832 Chronic kidney disease, stage 3b: Secondary | ICD-10-CM | POA: Diagnosis present

## 2020-10-14 DIAGNOSIS — N179 Acute kidney failure, unspecified: Secondary | ICD-10-CM | POA: Diagnosis present

## 2020-10-14 DIAGNOSIS — E785 Hyperlipidemia, unspecified: Secondary | ICD-10-CM | POA: Diagnosis present

## 2020-10-14 DIAGNOSIS — F319 Bipolar disorder, unspecified: Secondary | ICD-10-CM | POA: Diagnosis not present

## 2020-10-14 DIAGNOSIS — W19XXXA Unspecified fall, initial encounter: Secondary | ICD-10-CM | POA: Diagnosis present

## 2020-10-14 DIAGNOSIS — Z7984 Long term (current) use of oral hypoglycemic drugs: Secondary | ICD-10-CM | POA: Diagnosis not present

## 2020-10-14 DIAGNOSIS — R2689 Other abnormalities of gait and mobility: Secondary | ICD-10-CM | POA: Diagnosis present

## 2020-10-14 DIAGNOSIS — Z8249 Family history of ischemic heart disease and other diseases of the circulatory system: Secondary | ICD-10-CM | POA: Diagnosis not present

## 2020-10-14 DIAGNOSIS — M009 Pyogenic arthritis, unspecified: Secondary | ICD-10-CM | POA: Diagnosis not present

## 2020-10-14 DIAGNOSIS — E118 Type 2 diabetes mellitus with unspecified complications: Secondary | ICD-10-CM

## 2020-10-14 DIAGNOSIS — Z20822 Contact with and (suspected) exposure to covid-19: Secondary | ICD-10-CM | POA: Diagnosis present

## 2020-10-14 DIAGNOSIS — Z91120 Patient's intentional underdosing of medication regimen due to financial hardship: Secondary | ICD-10-CM | POA: Diagnosis not present

## 2020-10-14 DIAGNOSIS — M25552 Pain in left hip: Secondary | ICD-10-CM | POA: Diagnosis not present

## 2020-10-14 DIAGNOSIS — M00852 Arthritis due to other bacteria, left hip: Secondary | ICD-10-CM | POA: Diagnosis not present

## 2020-10-14 DIAGNOSIS — R262 Difficulty in walking, not elsewhere classified: Secondary | ICD-10-CM

## 2020-10-14 DIAGNOSIS — E1122 Type 2 diabetes mellitus with diabetic chronic kidney disease: Secondary | ICD-10-CM | POA: Diagnosis present

## 2020-10-14 DIAGNOSIS — Z23 Encounter for immunization: Secondary | ICD-10-CM | POA: Diagnosis not present

## 2020-10-14 DIAGNOSIS — F3189 Other bipolar disorder: Secondary | ICD-10-CM | POA: Diagnosis present

## 2020-10-14 LAB — COMPREHENSIVE METABOLIC PANEL
ALT: 34 U/L (ref 0–44)
AST: 24 U/L (ref 15–41)
Albumin: 3.3 g/dL — ABNORMAL LOW (ref 3.5–5.0)
Alkaline Phosphatase: 43 U/L (ref 38–126)
Anion gap: 11 (ref 5–15)
BUN: 31 mg/dL — ABNORMAL HIGH (ref 6–20)
CO2: 24 mmol/L (ref 22–32)
Calcium: 9.2 mg/dL (ref 8.9–10.3)
Chloride: 107 mmol/L (ref 98–111)
Creatinine, Ser: 2.26 mg/dL — ABNORMAL HIGH (ref 0.61–1.24)
GFR, Estimated: 36 mL/min — ABNORMAL LOW (ref >60)
Glucose, Bld: 121 mg/dL — ABNORMAL HIGH (ref 70–99)
Potassium: 3.6 mmol/L (ref 3.5–5.1)
Sodium: 142 mmol/L (ref 135–145)
Total Bilirubin: 0.9 mg/dL (ref 0.3–1.2)
Total Protein: 7.3 g/dL (ref 6.5–8.1)

## 2020-10-14 LAB — CBC
HCT: 36.1 % — ABNORMAL LOW (ref 39.0–52.0)
Hemoglobin: 11.3 g/dL — ABNORMAL LOW (ref 13.0–17.0)
MCH: 27.4 pg (ref 26.0–34.0)
MCHC: 31.3 g/dL (ref 30.0–36.0)
MCV: 87.6 fL (ref 80.0–100.0)
Platelets: 259 10*3/uL (ref 150–400)
RBC: 4.12 MIL/uL — ABNORMAL LOW (ref 4.22–5.81)
RDW: 14.1 % (ref 11.5–15.5)
WBC: 11.5 10*3/uL — ABNORMAL HIGH (ref 4.0–10.5)
nRBC: 0 % (ref 0.0–0.2)

## 2020-10-14 LAB — HEMOGLOBIN A1C
Hgb A1c MFr Bld: 5.9 % — ABNORMAL HIGH (ref 4.8–5.6)
Mean Plasma Glucose: 122.63 mg/dL

## 2020-10-14 LAB — GLUCOSE, CAPILLARY
Glucose-Capillary: 99 mg/dL (ref 70–99)
Glucose-Capillary: 90 mg/dL (ref 70–99)
Glucose-Capillary: 102 mg/dL — ABNORMAL HIGH (ref 70–99)
Glucose-Capillary: 163 mg/dL — ABNORMAL HIGH (ref 70–99)

## 2020-10-14 LAB — SARS CORONAVIRUS 2 (TAT 6-24 HRS): SARS Coronavirus 2: NEGATIVE

## 2020-10-14 LAB — MAGNESIUM: Magnesium: 2.3 mg/dL (ref 1.7–2.4)

## 2020-10-14 LAB — HIV ANTIBODY (ROUTINE TESTING W REFLEX): HIV Screen 4th Generation wRfx: NONREACTIVE

## 2020-10-14 LAB — CBG MONITORING, ED: Glucose-Capillary: 116 mg/dL — ABNORMAL HIGH (ref 70–99)

## 2020-10-14 LAB — PHOSPHORUS: Phosphorus: 4 mg/dL (ref 2.5–4.6)

## 2020-10-14 MED ORDER — DIVALPROEX SODIUM ER 500 MG PO TB24
500.0000 mg | ORAL_TABLET | Freq: Every day | ORAL | Status: DC
  Administered 2020-10-14 – 2020-10-18 (×6): 500 mg via ORAL
  Filled 2020-10-14 (×8): qty 1

## 2020-10-14 MED ORDER — LACTATED RINGERS IV SOLN: INTRAVENOUS | Status: AC

## 2020-10-14 MED ORDER — ZIPRASIDONE HCL 40 MG PO CAPS
80.0000 mg | ORAL_CAPSULE | Freq: Two times a day (BID) | ORAL | Status: DC
Start: 2020-10-14 — End: 2020-10-19
  Administered 2020-10-14 – 2020-10-19 (×11): 80 mg via ORAL
  Filled 2020-10-14 (×4): qty 2
  Filled 2020-10-14: qty 1
  Filled 2020-10-14 (×9): qty 2

## 2020-10-14 MED ORDER — ATORVASTATIN CALCIUM 40 MG PO TABS
40.0000 mg | ORAL_TABLET | Freq: Every day | ORAL | Status: DC
  Administered 2020-10-14 – 2020-10-19 (×6): 40 mg via ORAL
  Filled 2020-10-14 (×6): qty 1

## 2020-10-14 NOTE — Progress Notes (Signed)
PROGRESS NOTE  Kyle Montgomery LNL:[id number] DOB: Jul 20, 1975 DOA: 10/13/2020 PCP: Andrey Campanile, MD  Brief History   Kyle Montgomery is a 45 y.o. male with medical history significant for type 2 diabetes, hypertension, hyperlipidemia, bipolar disorder, CKD 3B, who presented to [facility] ED with complaints of left hip pain worse for the past 3 days.  States he has had left hip pain, off and on for at least a year.  The pain migrates from left hip to left knee to left ankle but mostly on the left side.  No chills or night sweats.  Associative difficulty with ambulation.  No cardiopulmonary or GI symptoms.  He has presented to the ED for the past 3 years or so for the same complaints.  He is accompanied by his mother who assists with providing a history.  Work-up in the ED revealed elevated CRP 11.0, sed rate 45, CT scan left pelvis revealed sclerotic focus in the left iliac bone has marginally increased in size over the past 2 years.  There is some adjacent sclerosis of the left sacroiliac joint with sacroiliac joint erosions.  Unclear if this lesion is related to the sacroiliac joint changes or there is separate sacroiliitis.  No fracture.  Bilateral fat-containing inguinal hernias.  T-max 100.2 in the ED, tachycardic.  Due to concern for septic joint, EDP started IV antibiotics empirically Rocephin and IV vancomycin.  Patient admitted under hospitalist service.   ED Course:  T-max 100.2.  BP 114/92, heart rate 110, respiratory 16, O2 saturation 95-100% on room air.  Lab studies remarkable for creatinine 2.38 from baseline of 2.08 in August 12, 2019.  CRP 11.0.  Sed rate 45.  The patient has been admitted to a telemetry bed. Orthopedic surgery has been consulted. And MRI of the hip has been ordered.  Consultants  Orthopedic surgery  Procedures  None  Antibiotics   Anti-infectives (From admission, onward)    Start     Dose/Rate Route Frequency Ordered Stop   10/14/20 2200  cefTRIAXone (ROCEPHIN) 2 g  in sodium chloride 0.9 % 100 mL IVPB        2 g 200 mL/hr over 30 Minutes Intravenous Every 24 hours 10/13/20 2352     10/13/20 2315  cefTRIAXone (ROCEPHIN) 2 g in sodium chloride 0.9 % 100 mL IVPB        2 g 200 mL/hr over 30 Minutes Intravenous  Once 10/13/20 2306 10/14/20 0124   10/13/20 2315  vancomycin (VANCOREADY) IVPB 2000 mg/400 mL        2,000 mg 200 mL/hr over 120 Minutes Intravenous  Once 10/13/20 2308 10/14/20 0413   10/13/20 2307  vancomycin variable dose per unstable renal function (pharmacist dosing)         Does not apply See admin instructions 10/13/20 2308        Subjective  The patient is resting comfortably. No new complaints.   Objective   Vitals:  Vitals:   10/14/20 0813 10/14/20 1257  BP: 122/79 122/83  Pulse: 89 95  Resp: 19 18  Temp: 98.7 F (37.1 C) 98.4 F (36.9 C)  SpO2: 100% 95%    Exam:  Constitutional:  The patient is awake, alert, and oriented x 3. No acute distress. Respiratory:  CTA bilaterally, no w/r/r.  Respiratory effort normal. No retractions or accessory muscle use Cardiovascular:  RRR, no m/r/g No LE extremity edema   Normal pedal pulses Abdomen:  Abdomen appears normal; no tenderness or masses No hernias No  HSM Musculoskeletal:  Digits/nails BUE: no clubbing, cyanosis, petechiae, infection exam of joints, bones, muscles of at least one of following: head/neck, RUE, LUE, RLE, LLE   strength and tone normal, no atrophy, no abnormal movements Positive for tenderness in left hip. Skin:  No rashes, lesions, ulcers palpation of skin: no induration or nodules Neurologic:  CN 2-12 intact Sensation all 4 extremities intact Psychiatric:  Mental status Mood, affect appropriate Orientation to person, place, time  judgment and insight appear intact     I have personally reviewed the following:   Today's Data  Vitals  Lab Data  CBC, CMP  Micro Data  Blood cultures x 2: no growth  Imaging  MRI left hip is pending.   CT Pelvis without contrast   Scheduled Meds:  atorvastatin  40 mg Oral Daily   divalproex  500 mg Oral QHS   enoxaparin (LOVENOX) injection  40 mg Subcutaneous Q24H   insulin aspart  0-5 Units Subcutaneous QHS   insulin aspart  0-9 Units Subcutaneous TID WC   vancomycin variable dose per unstable renal function (pharmacist dosing)   Does not apply See admin instructions   ziprasidone  80 mg Oral BID   Continuous Infusions:  cefTRIAXone (ROCEPHIN)  IV     lactated ringers 50 mL/hr at 10/14/20 0209    Active Problems:   Left hip pain   LOS: 0 days   A & P  Left hip pain, rule out septic arthritis CT scan left pelvis revealed sclerotic focus in the left iliac bone has marginally increased in size over the past 2 years.  There is some adjacent sclerosis of the left sacroiliac joint with sacroiliac joint erosions. CRP 11.0 Sed rate 45. Pain control IR consulted for possible joint aspiration Follow blood cultures x2 peripherally Started on Rocephin and IV vancomycin in the ED, continue.   AKI on CKD 3B, suspect prerenal in the setting of dehydration Baseline creatinine appears to be 2.08 with GFR 43 Presented with creatinine of 2.38 with GFR of 33 Start gentle IV fluid hydration LR at 50 cc/hr x 1 day Avoid nephrotoxic agents, dehydration and hypotension Closely monitor urine output. Repeat BMP in the morning   Hypertension BP is currently at goal. Resume home oral antihypertensives.   Tachycardia, suspected in setting of dehydration Heart rate 107 from 110.   Hyperlipidemia Resume home statin   Type 2 diabetes with hyperlipidemia Obtain hemoglobin A1c in the morning Start Insulin sliding scale   Bipolar disorder Resume home Depakote and Geodon   Ambulatory dysfunction PT OT assessment Fall precautions  I have seen and examined this patient myself. I have spent 32 minutes in his evaluation and care.   Inpatient status DVT prophylaxis: Subcu Lovenox  daily. Code Status: Full code. Family Communication: None availability Disposition Plan: Admitted to MedSurg unit with remote telemetry. Consults called: Orthopedic surgery consulted by EDP.    Kyle Quast, DO [facility] Direct contact: see [email or web address]  7PM-7AM contact night coverage as above 10/14/2020, 3:42 PM  LOS: 0 days

## 2020-10-14 NOTE — Progress Notes (Signed)
   10/14/20 2134  Assess: MEWS Score  Temp (!) 100.6 F (38.1 C)  BP (!) 148/84  Pulse Rate (!) 105  Resp 18  SpO2 98 %  Assess: MEWS Score  MEWS Temp 1  MEWS Systolic 0  MEWS Pulse 1  MEWS RR 0  MEWS LOC 0  MEWS Score 2  MEWS Score Color Yellow  Assess: if the MEWS score is Yellow or Red  Were vital signs taken at a resting state? Yes  Focused Assessment Change from prior assessment (see assessment flowsheet)  Early Detection of Sepsis Score *See Row Information* High  MEWS guidelines implemented *See Row Information* Yes  Treat  MEWS Interventions Administered scheduled meds/treatments;Administered prn meds/treatments;Escalated (See documentation below)  Pain Scale 0-10  Pain Score 5  Pain Type Chronic pain  Pain Location Hip  Pain Orientation Left  Pain Descriptors / Indicators Aching;Discomfort  Pain Frequency Constant  Pain Onset On-going  Patients Stated Pain Goal 0  Pain Intervention(s) Medication (See eMAR)  Multiple Pain Sites No  Take Vital Signs  Increase Vital Sign Frequency  Yellow: Q 2hr X 2 then Q 4hr X 2, if remains yellow, continue Q 4hrs  Escalate  MEWS: Escalate Yellow: discuss with charge nurse/RN and consider discussing with provider and RRT  Notify: Charge Nurse/RN  Name of Charge Nurse/RN Notified Carla, RN  Date Charge Nurse/RN Notified 10/14/20  Time Charge Nurse/RN Notified 2135  Notify: Provider  Provider Name/Title Linton Flemings  Date Provider Notified 10/14/20  Time Provider Notified 2140  Notification Type Page  Notification Reason Change in status  Provider response Evaluate remotely  Document  Progress note created (see row info) Yes

## 2020-10-15 DIAGNOSIS — M25552 Pain in left hip: Secondary | ICD-10-CM | POA: Diagnosis not present

## 2020-10-15 DIAGNOSIS — E118 Type 2 diabetes mellitus with unspecified complications: Secondary | ICD-10-CM | POA: Diagnosis not present

## 2020-10-15 DIAGNOSIS — M009 Pyogenic arthritis, unspecified: Secondary | ICD-10-CM | POA: Diagnosis not present

## 2020-10-15 DIAGNOSIS — F319 Bipolar disorder, unspecified: Secondary | ICD-10-CM | POA: Diagnosis not present

## 2020-10-15 LAB — CBC WITH DIFFERENTIAL/PLATELET
Abs Immature Granulocytes: 0.02 10*3/uL (ref 0.00–0.07)
Basophils Absolute: 0 10*3/uL (ref 0.0–0.1)
Basophils Relative: 0 %
Eosinophils Absolute: 0 10*3/uL (ref 0.0–0.5)
Eosinophils Relative: 0 %
HCT: 33.8 % — ABNORMAL LOW (ref 39.0–52.0)
Hemoglobin: 10.8 g/dL — ABNORMAL LOW (ref 13.0–17.0)
Immature Granulocytes: 0 %
Lymphocytes Relative: 16 %
Lymphs Abs: 1.5 10*3/uL (ref 0.7–4.0)
MCH: 27.7 pg (ref 26.0–34.0)
MCHC: 32 g/dL (ref 30.0–36.0)
MCV: 86.7 fL (ref 80.0–100.0)
Monocytes Absolute: 1.2 10*3/uL — ABNORMAL HIGH (ref 0.1–1.0)
Monocytes Relative: 13 %
Neutro Abs: 6.8 10*3/uL (ref 1.7–7.7)
Neutrophils Relative %: 71 %
Platelets: 229 10*3/uL (ref 150–400)
RBC: 3.9 MIL/uL — ABNORMAL LOW (ref 4.22–5.81)
RDW: 13.8 % (ref 11.5–15.5)
WBC: 9.6 10*3/uL (ref 4.0–10.5)
nRBC: 0 % (ref 0.0–0.2)

## 2020-10-15 LAB — BLOOD CULTURE ID PANEL (REFLEXED) - BCID2

## 2020-10-15 LAB — BASIC METABOLIC PANEL
Anion gap: 9 (ref 5–15)
BUN: 23 mg/dL — ABNORMAL HIGH (ref 6–20)
CO2: 26 mmol/L (ref 22–32)
Calcium: 9.1 mg/dL (ref 8.9–10.3)
Chloride: 107 mmol/L (ref 98–111)
Creatinine, Ser: 2.09 mg/dL — ABNORMAL HIGH (ref 0.61–1.24)
GFR, Estimated: 39 mL/min — ABNORMAL LOW (ref >60)
Glucose, Bld: 128 mg/dL — ABNORMAL HIGH (ref 70–99)
Potassium: 3.4 mmol/L — ABNORMAL LOW (ref 3.5–5.1)
Sodium: 142 mmol/L (ref 135–145)

## 2020-10-15 LAB — GLUCOSE, CAPILLARY
Glucose-Capillary: 116 mg/dL — ABNORMAL HIGH (ref 70–99)
Glucose-Capillary: 100 mg/dL — ABNORMAL HIGH (ref 70–99)
Glucose-Capillary: 88 mg/dL (ref 70–99)
Glucose-Capillary: 134 mg/dL — ABNORMAL HIGH (ref 70–99)

## 2020-10-15 MED ORDER — VANCOMYCIN HCL IN DEXTROSE 1-5 GM/200ML-% IV SOLN
1000.0000 mg | INTRAVENOUS | Status: DC
  Administered 2020-10-15 – 2020-10-17 (×3): 1000 mg via INTRAVENOUS
  Filled 2020-10-15 (×4): qty 200

## 2020-10-15 MED ORDER — FUROSEMIDE 10 MG/ML IJ SOLN
20.0000 mg | Freq: Once | INTRAMUSCULAR | Status: AC
  Administered 2020-10-16: 20 mg via INTRAVENOUS
  Filled 2020-10-15: qty 2

## 2020-10-15 MED ORDER — POTASSIUM CHLORIDE CRYS ER 20 MEQ PO TBCR
40.0000 meq | EXTENDED_RELEASE_TABLET | Freq: Once | ORAL | Status: AC
  Administered 2020-10-15: 40 meq via ORAL
  Filled 2020-10-15: qty 2

## 2020-10-15 NOTE — Evaluation (Signed)
Occupational Therapy Evaluation Patient Details Name: Kyle Montgomery MRN: [id number] DOB: [birth year] Today's Date: 10/15/2020   History of Present Illness ISSAAC Montgomery is a 45 y.o. male who presented to [facility] ED with complaints of left hip pain worse for the past 3 days; States he has had left hip pain, off and on for at least a year; labs and imaging concerning for iliac lesion, possible sacroilitis, possible septic joint; with medical history significant for type 2 diabetes, hypertension, hyperlipidemia, bipolar disorder, CKD 3B,   Clinical Impression   Taran required assist ADLs and ambulated with crutches PTA , he states he was not able to leave the house much due to pain. He lives in a 1 level home with 5-6 STE with his mother and step-dad who are not able to provide physical assistance. Pt required mod A for functional mobility with RW; and up to max A for ADLs. He is limited by pain in the L hip and ankle, and poor activity tolerance. Pt would benefit from OT acutely to address deficits and limitations below. Recommending SNF at this time due to heavy assist levels.    Recommendations for follow up therapy are one component of a multi-disciplinary discharge planning process, led by the attending physician.  Recommendations may be updated based on patient status, additional functional criteria and insurance authorization.   Follow Up Recommendations  SNF;Supervision/Assistance - 24 hour (Pending pt progression acutey pt may be able to d/c home with HHOT; at this evaluation he is requiring heavy phsyical assist in which he reports his parents cannot provide.)    Equipment Recommendations  3 in 1 bedside commode;Tub/shower seat;Wheelchair (measurements OT);Wheelchair cushion (measurements OT) (RW)       Precautions / Restrictions Precautions Precautions: Fall Restrictions Weight Bearing Restrictions: No      Mobility Bed Mobility Overal bed mobility: Modified Independent              General bed mobility comments: +HOB elevated and incrased time    Transfers Overall transfer level: Needs assistance Equipment used: Rolling walker (2 wheeled) Transfers: Sit to/from [employer] Sit to Stand: Mod assist;From elevated surface Stand pivot transfers: Min guard       General transfer comment: mod A for powering up into standing; pt with extreme flexed posture initially    Balance Overall balance assessment: Needs assistance Sitting-balance support: Feet supported Sitting balance-Leahy Scale: Fair     Standing balance support: Bilateral upper extremity supported Standing balance-Leahy Scale: Poor                             ADL either performed or assessed with clinical judgement   ADL Overall ADL's : Needs assistance/impaired Eating/Feeding: Independent;Sitting   Grooming: Set up;Sitting   Upper Body Bathing: Set up;Sitting   Lower Body Bathing: Maximal assistance;Sit to/from stand   Upper Body Dressing : Set up;Sitting   Lower Body Dressing: Maximal assistance;Sit to/from stand   Toilet Transfer: Moderate assistance;Stand-pivot;RW;BSC Toilet Transfer Details (indicate cue type and reason): simulated from bed>chair. mod A for powering up into standing; min guard for pivot with RW. VCs throughout for sequencing and safety Toileting- Clothing Manipulation and Hygiene: Maximal assistance;Sit to/from stand       Functional mobility during ADLs: Moderate assistance;Rolling walker;Cueing for safety;Cueing for sequencing General ADL Comments: moderate verbal cues throughout for all sequencing and problem solving. Max A fro standing tasks due to BUE reliant on RW  Vision Baseline Vision/History: 0 No visual deficits Ability to See in Adequate Light: 0 Adequate Patient Visual Report: No change from baseline Vision Assessment?: No apparent visual deficits     Perception     Praxis      Pertinent Vitals/Pain Pain  Assessment: Faces Faces Pain Scale: Hurts little more Pain Location: L ankle and hip with weightbearing Pain Descriptors / Indicators: Discomfort;Grimacing;Guarding Pain Intervention(s): Limited activity within patient's tolerance;Monitored during session     Hand Dominance Right   Extremity/Trunk Assessment Upper Extremity Assessment Upper Extremity Assessment: Overall WFL for tasks assessed   Lower Extremity Assessment Lower Extremity Assessment: Defer to PT evaluation   Cervical / Trunk Assessment Cervical / Trunk Assessment: Normal   Communication Communication Communication: No difficulties   Cognition Arousal/Alertness: Awake/alert Behavior During Therapy: WFL for tasks assessed/performed Overall Cognitive Status: No family/caregiver present to determine baseline cognitive functioning                                 General Comments: pt perseverating on bilat ankle swelling and pain, difficult to redirect. pt also gave conflicting PLOF report. Pt presents wtih a stutter, and required incrased time   General Comments  VSS on RA pt verbalized swelling in bilat ankles however it was not apparent to this OT, offered ice and elevation, pt declined    Exercises     Shoulder Instructions      Home Living Family/patient expects to be discharged to:: Private residence Living Arrangements: Parent;Other (Comment) (step dad) Available Help at Discharge: Family Type of Home: House Home Access: Stairs to enter [employer] of Steps: 5 or 6 Entrance Stairs-Rails: Can reach both Home Layout: Two level;One level     Bathroom Shower/Tub: Adult nurse: Yes   Home Equipment: Crutches   Additional Comments: pt's mother and step-dad are not able to provide physical assist      Prior Functioning/Environment Level of Independence: Needs assistance  Gait / Transfers Assistance Needed: used crutches ADL's / Homemaking  Assistance Needed: limited historian- pt states he needed help, then states he does it on his own            OT Problem List: Decreased strength;Decreased range of motion;Decreased activity tolerance;Impaired balance (sitting and/or standing);Decreased safety awareness;Decreased knowledge of use of DME or AE;Pain      OT Treatment/Interventions: Therapeutic exercise;Self-care/ADL training;DME and/or AE instruction;Therapeutic activities;Patient/family education;Balance training    OT Goals(Current goals can be found in the care plan section) Acute Rehab OT Goals Patient Stated Goal: less pain OT Goal Formulation: With patient Time For Goal Achievement: 10/28/20 Potential to Achieve Goals: Fair ADL Goals Pt Will Perform Grooming: with supervision;standing Pt Will Perform Lower Body Bathing: with min guard assist;sit to/from stand Pt Will Perform Lower Body Dressing: with min guard assist;sit to/from stand Pt Will Transfer to Toilet: with min guard assist;ambulating Pt Will Perform Toileting - Clothing Manipulation and hygiene: with supervision;sitting/lateral leans  OT Frequency: Min 2X/week   Barriers to D/C: Decreased caregiver support  pt's family is unable to physically assist pt       Co-evaluation              AM-PAC OT "6 Clicks" Daily Activity     Outcome Measure Help from another person eating meals?: None Help from another person taking care of personal grooming?: A Little Help from another person toileting, which includes using toliet,  bedpan, or urinal?: A Lot Help from another person bathing (including washing, rinsing, drying)?: A Lot Help from another person to put on and taking off regular upper body clothing?: None Help from another person to put on and taking off regular lower body clothing?: A Lot 6 Click Score: 17   End of Session Equipment Utilized During Treatment: Gait belt;Rolling walker Nurse Communication: Mobility  status;Precautions  Activity Tolerance: Patient tolerated treatment well;Patient limited by pain Patient left: in chair;with call bell/phone within reach;with chair alarm set  OT Visit Diagnosis: Unsteadiness on feet (R26.81);Other abnormalities of gait and mobility (R26.89);Muscle weakness (generalized) (M62.81);Pain                Time: 4315-4008 OT Time Calculation (min): 27 min Charges:  OT General Charges $OT Visit: 1 Visit OT Evaluation $OT Eval Moderate Complexity: 1 Mod OT Treatments $Self Care/Home Management : 8-22 mins   Mayson Sterbenz A Angela Vazguez 10/15/2020, 9:33 AM

## 2020-10-15 NOTE — Evaluation (Signed)
Physical Therapy Evaluation Patient Details Name: Kyle Montgomery MRN: [id number] DOB: [birth year] Today's Date: 10/15/2020  History of Present Illness  Kyle Montgomery is a 45 y.o. male who presented to [facility] ED with complaints of left hip pain worse for the past 3 days; States he has had left hip pain, off and on for at least a year; labs and imaging concerning for iliac lesion, possible sacroilitis, possible septic joint; with medical history significant for type 2 diabetes, hypertension, hyperlipidemia, bipolar disorder, CKD 3B,  Clinical Impression   Pt admitted with above diagnosis. Lives at home with parents in a house with 5-6 steps to enter; Typically does not need an assistive device, but has used crutches in the time leadin gup to this admission; Presents to PT with decr functional mobility, bil foot pain limiting standing and ambulation tolerance; performed sit to stand with moderate assist and very slow rise, then able to take shuffle steps to chair with RW and minguard assist; Will need to be at supervision/modified independent functional level to be able to dc home -- I value getting him home to his familiar environment, and routines; Still, if slow progress we must consider SNF for rehab; Pt currently with functional limitations due to the deficits listed below (see PT Problem List). Pt will benefit from skilled PT to increase their independence and safety with mobility to allow discharge to the venue listed below.          Recommendations for follow up therapy are one component of a multi-disciplinary discharge planning process, led by the attending physician.  Recommendations may be updated based on patient status, additional functional criteria and insurance authorization.  Follow Up Recommendations Home health PT;Supervision/Assistance - 24 hour;Other (comment) (Can family arrange for assist for Kyle Montgomery so he can dc home? Does he qualify for a PCA with Medicaid?)    If slow progress we  must consider SNF for rehab  Equipment Recommendations  Rolling walker with 5" wheels;3in1 (PT)    Recommendations for Other Services       Precautions / Restrictions Precautions Precautions: Fall      Mobility  Bed Mobility Overal bed mobility: Modified Independent;Needs Assistance Bed Mobility: Supine to Sit     Supine to sit: Supervision          Transfers Overall transfer level: Needs assistance Equipment used: Rolling walker (2 wheeled) Transfers: Sit to/from [employer] Transfers Sit to Stand: Mod assist;From elevated surface Stand pivot transfers: Min guard       General transfer comment: mod A for powering up into standing; pt with extreme flexed posture initially  Ambulation/Gait             General Gait Details: sliding, pivot steps during bed to chair transfer with RW`  Stairs            Wheelchair Mobility    Modified Rankin (Stroke Patients Only)       Balance     Sitting balance-Leahy Scale: Fair       Standing balance-Leahy Scale: Poor                               Pertinent Vitals/Pain Pain Assessment: Faces Faces Pain Scale: Hurts little more Pain Location: bilateral feet with bearing weight Pain Descriptors / Indicators: Discomfort;Grimacing;Guarding Pain Intervention(s): Monitored during session    Home Living Family/patient expects to be discharged to:: Private residence Living Arrangements: Parent;Other (Comment) (step dad)  Available Help at Discharge: Family Type of Home: House Home Access: Stairs to enter Entrance Stairs-Rails: Can reach both Entrance Stairs-Number of Steps: 5 or 6 Home Layout: Two level;One level Home Equipment: Crutches Additional Comments: pt's mother and step-dad are not able to provide physical assist    Prior Function Level of Independence: Needs assistance   Gait / Transfers Assistance Needed: used crutches  ADL's / Homemaking Assistance Needed: limited  historian- pt states he needed help, then states he does it on his own        Hand Dominance   Dominant Hand: Right    Extremity/Trunk Assessment   Upper Extremity Assessment Upper Extremity Assessment: Defer to OT evaluation    Lower Extremity Assessment Lower Extremity Assessment: RLE deficits/detail;LLE deficits/detail RLE Deficits / Details: Pain in bilateral feet limiting pt's ability to bear weight and take steps LLE Deficits / Details: Pain in bilateral feet limiting pt's ability to bear weight and take steps    Cervical / Trunk Assessment Cervical / Trunk Assessment: Normal  Communication   Communication: No difficulties  Cognition Arousal/Alertness: Awake/alert Behavior During Therapy: WFL for tasks assessed/performed Overall Cognitive Status: No family/caregiver present to determine baseline cognitive functioning                                 General Comments: pt perseverating on bilat ankle swelling and pain, difficult to redirect. pt also gave conflicting PLOF report. Pt presents wtih a stutter, and required incrased time      General Comments General comments (skin integrity, edema, etc.): VSS on RA    Exercises     Assessment/Plan    PT Assessment Patient needs continued PT services  PT Problem List Decreased strength;Decreased range of motion;Decreased activity tolerance;Decreased balance;Decreased mobility;Decreased coordination;Decreased cognition;Decreased knowledge of use of DME;Decreased safety awareness;Decreased knowledge of precautions;Pain       PT Treatment Interventions DME instruction;Gait training;Stair training;Functional mobility training;Therapeutic activities;Therapeutic exercise;Balance training;Neuromuscular re-education;Cognitive remediation;Patient/family education    PT Goals (Current goals can be found in the Care Plan section)  Acute Rehab PT Goals Patient Stated Goal: less pain PT Goal Formulation: With  patient Time For Goal Achievement: 10/29/20 Potential to Achieve Goals: Good    Frequency Min 4X/week   Barriers to discharge Decreased caregiver support Must be modified independent to be able to dc home; Mom and step-dad cannot give physical assist    Co-evaluation               AM-PAC PT "6 Clicks" Mobility  Outcome Measure Help needed turning from your back to your side while in a flat bed without using bedrails?: None Help needed moving from lying on your back to sitting on the side of a flat bed without using bedrails?: None Help needed moving to and from a bed to a chair (including a wheelchair)?: A Little Help needed standing up from a chair using your arms (e.g., wheelchair or bedside chair)?: A Lot Help needed to walk in hospital room?: A Lot Help needed climbing 3-5 steps with a railing? : A Lot 6 Click Score: 17    End of Session Equipment Utilized During Treatment: Gait belt Activity Tolerance: Patient tolerated treatment well;Patient limited by pain Patient left: in chair;with call bell/phone within reach;with chair alarm set Nurse Communication: Mobility status (and temp of 100.4) PT Visit Diagnosis: Unsteadiness on feet (R26.81);Other abnormalities of gait and mobility (R26.89);Pain Pain - Right/Left:  (bilateral feet)  Pain - part of body: Ankle and joints of foot    Time: 1610 (in and out times are approximate)-1640 PT Time Calculation (min) (ACUTE ONLY): 30 min   Charges:   PT Evaluation $PT Eval Moderate Complexity: 1 Mod PT Treatments $Therapeutic Activity: 8-22 mins        Van Clines, PT  Acute Rehabilitation Services Pager [phone] Office [phone]   Levi Aland 10/15/2020, 5:18 PM

## 2020-10-15 NOTE — Consult Note (Signed)
ORTHOPAEDIC CONSULTATION  REQUESTING PHYSICIAN: Swayze, Ava, DO  Chief Complaint: Unable to weight-bear with concern for possible septic SI joint  HPI: Kyle Montgomery is a 45 y.o. male history of type 2 diabetes, bipolar disorder, CKD, hyperlipidemia who was admitted 2 days ago with pain in the left posterior hip and inability to weight-bear.  At that time they were concerned that he was septic with tachycardia and low-grade fevers.  He has been on empiric antibiotics with Rocephin and vancomycin.  He underwent imaging including CT and MRI there was concern for a small effusion of the left SI joint concerning for possible SI joint septic arthritis.  When I saw him this afternoon he reports that the pain he was feeling has completely resolved and was able to ambulate for me with a walker having not been able to for the past few days.  He denies any pain at all at this time.  Past Medical History:  Diagnosis Date   Bipolar affective (HCC)    followed by mental health   Gynecomastia 02/07   normal pituitary by MRI   Hyperprolactinemia (HCC) 09/27/04   felt 2/2 lithium and fluphenazine   Hypertension    Renal insufficiency    baseline Cr 2.2 - renal US with increased Echo signal, no nephrology eval, no bx   Past Surgical History:  Procedure Laterality Date   DENTAL SURGERY     Social History   Socioeconomic History   Marital status: Single    Spouse name: Not on file   Number of children: Not on file   Years of education: Not on file   Highest education level: Not on file  Occupational History   Not on file  Tobacco Use   Smoking status: Former    Types: Cigarettes    Quit date: 01/22/1983    Years since quitting: 37.7   Smokeless tobacco: Never  Vaping Use   Vaping Use: Never used  Substance and Sexual Activity   Alcohol use: No    Alcohol/week: 0.0 standard drinks   Drug use: No   Sexual activity: Not on file  Other Topics Concern   Not on file  Social History Narrative    Not on file   Social Determinants of Health   Financial Resource Strain: Not on file  Food Insecurity: Not on file  Transportation Needs: Not on file  Physical Activity: Not on file  Stress: Not on file  Social Connections: Not on file   Family History  Problem Relation Age of Onset   Hyperlipidemia Mother    Hypertension Mother    Atrial fibrillation Mother    Hypertension Brother    Hypertension Maternal Grandmother    Cancer Maternal Grandmother    No Known Allergies   Positive ROS: All other systems have been reviewed and were otherwise negative with the exception of those mentioned in the HPI and as above.  Physical Exam: General: Alert, no acute distress Cardiovascular: No pedal edema Respiratory: No cyanosis, no use of accessory musculature Skin: No lesions in the area of chief complaint Neurologic: Sensation intact distally  MUSCULOSKELETAL:  Skin is intact to the bilateral lower extremities no erythema or swelling.  Tolerates full active and passive range of motion of the hips knees and ankles.  Nontender to palpation about the hip trochanter and bilateral SI joints.  IMAGING:  X-rays, CT, and MRI were reviewed demonstrate sclerotic changes around the left SI joint as well as a small left SI joint effusion  and surrounding inflammatory changes.  No appreciable effusion of the hip  Assessment: Active Problems:   Left hip pain   Septic arthritis (HCC)  Possible left SI joint septic arthritis  Plan: Findings on the patient's imaging are concerning for possible SI joint septic arthritis.  Aspiration of the left SI joint would allow to confirm this diagnosis and guide antibiotic treatment.  No surgical intervention would be indicated for left SI joint septic arthritis, would recommend treatment with antibiotics only.  He appears to be responding well to current empiric antibiotic treatment as he is able to now ambulate and not having any pain on examination.  No  concern for a septic arthritis of the left hip joint at this time given his exam and imaging findings.     Joen Laura, MD Cell [phone]

## 2020-10-15 NOTE — Progress Notes (Signed)
Pharmacy Antibiotic Note  Kyle Montgomery is a 45 y.o. male admitted on 10/13/2020 with  septic arthritis .  Pharmacy has been consulted for vancomycin dosing.  Patient's serum creatinine is improved to 2.09, unclear baseline. WBC 9.6. Overnight, BCID result of 1/4 staph species with no particular strain. CT scan left pelvis revealed sclerotic focus in the left iliac bone has marginally increased in size over the past 2 years.  There is some adjacent sclerosis of the left sacroiliac joint with sacroiliac joint erosions. Consulting IR for possible joint aspiration. Patient has been on vancomycin variable dosing per renal function since admission.   Plan: Ceftriaxone 2Q IV Q24H  Vancomycin 1000 MG IV Q24H (eAUC 487, Scr 2.09)  Monitor renal function closely for dose adjustment needs  Follow clinical status and treatment plan   Height: 5\' 10"  (177.8 cm) Weight: 96.2 kg (212 lb 1.3 oz) IBW/kg (Calculated) : 73  Temp (24hrs), Avg:99.1 F (37.3 C), Min:98.4 F (36.9 C), Max:100.6 F (38.1 C)  Recent Labs  Lab 10/13/20 2030 10/14/20 0252 10/15/20 0641  WBC 10.5 11.5* 9.6  CREATININE 2.38* 2.26* 2.09*     Estimated Creatinine Clearance: 52 mL/min (A) (by C-G formula based on SCr of 2.09 mg/dL (H)).    No Known Allergies  Antimicrobials this admission: Ceftriaxone 9/23 >>  Vancomycin 9/23 >>   Microbiology results: 9/23 Bcx >> gram pos cocci 9/24 BCID >> staph species    10/24, PharmD, [facility] Pharmacy Resident [phone] 10/15/2020 9:39 AM

## 2020-10-15 NOTE — Progress Notes (Signed)
Pt fixated on his feet today. Stated they were swelling from having the SCDs on overnight. By end of shift, able to narrow it down that his feet hurt when he walked on them, but he wanted to walk.

## 2020-10-15 NOTE — Progress Notes (Signed)
PHARMACY - PHYSICIAN COMMUNICATION CRITICAL VALUE ALERT - BLOOD CULTURE IDENTIFICATION (BCID)  Kyle Montgomery is an 45 y.o. male who presented to [facility] on 10/13/2020 with a chief complaint of possible septic arthritis  Assessment:  Pt presented with hip pain. Vanc/ceftriaxone were started for possible septic arthritis. Lab called with BCID result of 1/4 staph species without a particular strain. Cont current abx.   Name of physician (or Provider) Contacted: Audrea Muscat, NP  Current antibiotics: Vanc/ceftriaxone  Changes to prescribed antibiotics recommended:  Continue current abx  Results for orders placed or performed during the hospital encounter of 10/13/20  Blood Culture ID Panel (Reflexed) (Collected: 10/13/2020 11:17 PM)  Result Value Ref Range   Enterococcus faecalis NOT DETECTED NOT DETECTED   Enterococcus Faecium NOT DETECTED NOT DETECTED   Listeria monocytogenes NOT DETECTED NOT DETECTED   Staphylococcus species DETECTED (A) NOT DETECTED   Staphylococcus aureus (BCID) NOT DETECTED NOT DETECTED   Staphylococcus epidermidis NOT DETECTED NOT DETECTED   Staphylococcus lugdunensis NOT DETECTED NOT DETECTED   Streptococcus species NOT DETECTED NOT DETECTED   Streptococcus agalactiae NOT DETECTED NOT DETECTED   Streptococcus pneumoniae NOT DETECTED NOT DETECTED   Streptococcus pyogenes NOT DETECTED NOT DETECTED   A.calcoaceticus-baumannii NOT DETECTED NOT DETECTED   Bacteroides fragilis NOT DETECTED NOT DETECTED   Enterobacterales NOT DETECTED NOT DETECTED   Enterobacter cloacae complex NOT DETECTED NOT DETECTED   Escherichia coli NOT DETECTED NOT DETECTED   Klebsiella aerogenes NOT DETECTED NOT DETECTED   Klebsiella oxytoca NOT DETECTED NOT DETECTED   Klebsiella pneumoniae NOT DETECTED NOT DETECTED   Proteus species NOT DETECTED NOT DETECTED   Salmonella species NOT DETECTED NOT DETECTED   Serratia marcescens NOT DETECTED NOT DETECTED   Haemophilus influenzae NOT  DETECTED NOT DETECTED   Neisseria meningitidis NOT DETECTED NOT DETECTED   Pseudomonas aeruginosa NOT DETECTED NOT DETECTED   Stenotrophomonas maltophilia NOT DETECTED NOT DETECTED   Candida albicans NOT DETECTED NOT DETECTED   Candida auris NOT DETECTED NOT DETECTED   Candida glabrata NOT DETECTED NOT DETECTED   Candida krusei NOT DETECTED NOT DETECTED   Candida parapsilosis NOT DETECTED NOT DETECTED   Candida tropicalis NOT DETECTED NOT DETECTED   Cryptococcus neoformans/gattii NOT DETECTED NOT DETECTED    [name], PharmD, BCIDP, AAHIVP, CPP Infectious Disease Pharmacist 10/15/2020 1:13 AM

## 2020-10-15 NOTE — Progress Notes (Signed)
PROGRESS NOTE  Kyle Montgomery WFU:[id number] DOB: 25-Oct-1975 DOA: 10/13/2020 PCP: Andrey Campanile, MD  Brief History   Kyle Montgomery is a 45 y.o. male with medical history significant for type 2 diabetes, hypertension, hyperlipidemia, bipolar disorder, CKD 3B, who presented to [facility] ED with complaints of left hip pain worse for the past 3 days.  States he has had left hip pain, off and on for at least a year.  The pain migrates from left hip to left knee to left ankle but mostly on the left side.  No chills or night sweats.  Associative difficulty with ambulation.  No cardiopulmonary or GI symptoms.  He has presented to the ED for the past 3 years or so for the same complaints.  He is accompanied by his mother who assists with providing a history.  Work-up in the ED revealed elevated CRP 11.0, sed rate 45, CT scan left pelvis revealed sclerotic focus in the left iliac bone has marginally increased in size over the past 2 years.  There is some adjacent sclerosis of the left sacroiliac joint with sacroiliac joint erosions.  Unclear if this lesion is related to the sacroiliac joint changes or there is separate sacroiliitis.  No fracture.  Bilateral fat-containing inguinal hernias.  T-max 100.2 in the ED, tachycardic.  Due to concern for septic joint, EDP started IV antibiotics empirically Rocephin and IV vancomycin.  Patient admitted under hospitalist service.   ED Course:  T-max 100.2.  BP 114/92, heart rate 110, respiratory 16, O2 saturation 95-100% on room air.  Lab studies remarkable for creatinine 2.38 from baseline of 2.08 in August 12, 2019.  CRP 11.0.  Sed rate 45.  The patient has been admitted to a telemetry bed. Orthopedic surgery has been consulted. And MRI of the hip has been ordered.  The patient is complaining of pain in feet with walking. This is likely due to neuropathy.  Consultants  Orthopedic surgery  Procedures  None  Antibiotics   Anti-infectives (From admission, onward)     Start     Dose/Rate Route Frequency Ordered Stop   10/15/20 1030  vancomycin (VANCOCIN) IVPB 1000 mg/200 mL premix        1,000 mg 200 mL/hr over 60 Minutes Intravenous Every 24 hours 10/15/20 0940     10/14/20 2200  cefTRIAXone (ROCEPHIN) 2 g in sodium chloride 0.9 % 100 mL IVPB        2 g 200 mL/hr over 30 Minutes Intravenous Every 24 hours 10/13/20 2352     10/13/20 2315  cefTRIAXone (ROCEPHIN) 2 g in sodium chloride 0.9 % 100 mL IVPB        2 g 200 mL/hr over 30 Minutes Intravenous  Once 10/13/20 2306 10/14/20 0124   10/13/20 2315  vancomycin (VANCOREADY) IVPB 2000 mg/400 mL        2,000 mg 200 mL/hr over 120 Minutes Intravenous  Once 10/13/20 2308 10/14/20 0413   10/13/20 2307  vancomycin variable dose per unstable renal function (pharmacist dosing)  Status:  Discontinued         Does not apply See admin instructions 10/13/20 2308 10/15/20 0940      Subjective  The patient is resting comfortably. No new complaints.   Objective   Vitals:  Vitals:   10/15/20 1026 10/15/20 1615  BP: 132/85 125/90  Pulse: 98 99  Resp: 16 20  Temp: 98.8 F (37.1 C) (!) 100.4 F (38 C)  SpO2: 98%     Exam:  Constitutional:  The patient is awake, alert, and oriented x 3. No acute distress. Respiratory:  CTA bilaterally, no w/r/r.  Respiratory effort normal. No retractions or accessory muscle use Cardiovascular:  RRR, no m/r/g No LE extremity edema   Normal pedal pulses Abdomen:  Abdomen appears normal; no tenderness or masses No hernias No HSM Musculoskeletal:  Digits/nails BUE: no clubbing, cyanosis, petechiae, infection exam of joints, bones, muscles of at least one of following: head/neck, RUE, LUE, RLE, LLE   strength and tone normal, no atrophy, no abnormal movements Positive for tenderness in left hip. Patient complaining of pain in feet bilaterally with walking. Skin:  No rashes, lesions, ulcers palpation of skin: no induration or nodules Neurologic:  CN 2-12  intact Sensation all 4 extremities intact Psychiatric:  Mental status Mood, affect appropriate Orientation to person, place, time  judgment and insight appear intact    I have personally reviewed the following:   Today's Data  Vitals  Lab Data  CBC, CMP  Micro Data  Blood cultures x 2: no growth  Imaging  MRI left hip is pending.  CT Pelvis without contrast   Scheduled Meds:  atorvastatin  40 mg Oral Daily   divalproex  500 mg Oral QHS   enoxaparin (LOVENOX) injection  40 mg Subcutaneous Q24H   insulin aspart  0-5 Units Subcutaneous QHS   insulin aspart  0-9 Units Subcutaneous TID WC   ziprasidone  80 mg Oral BID   Continuous Infusions:  cefTRIAXone (ROCEPHIN)  IV 2 g (10/14/20 2139)   vancomycin 1,000 mg (10/15/20 1403)    Active Problems:   Left hip pain   Septic arthritis (HCC)   LOS: 1 day   A & P  Left hip pain, rule out septic arthritis CT scan left pelvis revealed sclerotic focus in the left iliac bone has marginally increased in size over the past 2 years.  There is some adjacent sclerosis of the left sacroiliac joint with sacroiliac joint erosions. MRI hip has demonstrated small joint effusions of the hip and the SI joint. Orthopedic surgery consulted. CRP 11.0 Sed rate 45. Pain control IR consulted for possible joint aspiration Follow blood cultures x2 peripherally Started on Rocephin and IV vancomycin in the ED, continue. I appreciate orthopedic surgery's help.    AKI on CKD 3B, suspect prerenal in the setting of dehydration Resolved. Creatinine is at baseline. Baseline creatinine appears to be 2.08 with GFR 43 Presented with creatinine of 2.38 with GFR of 33 Stop IV fluid hydration LR at 50 cc/hr. Avoid nephrotoxic agents, dehydration and hypotension Closely monitor urine output. Monitor creatinine, electrolytes, and volume status.   Hypertension BP is currently at goal. Resume home oral antihypertensives.   Tachycardia, suspected in  setting of dehydration Heart rate 107 from 110.   Hyperlipidemia Resume home statin   Type 2 diabetes with hyperlipidemia Obtain hemoglobin A1c in the morning Start Insulin sliding scale   Bipolar disorder Resume home Depakote and Geodon   Ambulatory dysfunction PT OT assessment Fall precautions  I have seen and examined this patient myself. I have spent 30 minutes in his evaluation and care.   Inpatient status DVT prophylaxis: Subcu Lovenox daily. Code Status: Full code. Family Communication: None availability Disposition Plan: Admitted to MedSurg unit with remote telemetry. Consults called: Orthopedic surgery consulted by EDP.    Kyle Talarico, DO [facility] Direct contact: see [email or web address]  7PM-7AM contact night coverage as above 10/15/2020, 5:50 PM  LOS: 0 days

## 2020-10-16 DIAGNOSIS — M25552 Pain in left hip: Secondary | ICD-10-CM | POA: Diagnosis not present

## 2020-10-16 DIAGNOSIS — F319 Bipolar disorder, unspecified: Secondary | ICD-10-CM | POA: Diagnosis not present

## 2020-10-16 DIAGNOSIS — E118 Type 2 diabetes mellitus with unspecified complications: Secondary | ICD-10-CM | POA: Diagnosis not present

## 2020-10-16 DIAGNOSIS — M009 Pyogenic arthritis, unspecified: Secondary | ICD-10-CM | POA: Diagnosis not present

## 2020-10-16 LAB — BASIC METABOLIC PANEL
Anion gap: 12 (ref 5–15)
BUN: 24 mg/dL — ABNORMAL HIGH (ref 6–20)
CO2: 25 mmol/L (ref 22–32)
Calcium: 9.1 mg/dL (ref 8.9–10.3)
Chloride: 103 mmol/L (ref 98–111)
Creatinine, Ser: 2.2 mg/dL — ABNORMAL HIGH (ref 0.61–1.24)
GFR, Estimated: 37 mL/min — ABNORMAL LOW (ref >60)
Glucose, Bld: 101 mg/dL — ABNORMAL HIGH (ref 70–99)
Potassium: 3.6 mmol/L (ref 3.5–5.1)
Sodium: 140 mmol/L (ref 135–145)

## 2020-10-16 LAB — CBC WITH DIFFERENTIAL/PLATELET
Abs Immature Granulocytes: 0.02 10*3/uL (ref 0.00–0.07)
Basophils Absolute: 0.1 10*3/uL (ref 0.0–0.1)
Basophils Relative: 1 %
Eosinophils Absolute: 0.2 10*3/uL (ref 0.0–0.5)
Eosinophils Relative: 2 %
HCT: 35.4 % — ABNORMAL LOW (ref 39.0–52.0)
Hemoglobin: 11.2 g/dL — ABNORMAL LOW (ref 13.0–17.0)
Immature Granulocytes: 0 %
Lymphocytes Relative: 25 %
Lymphs Abs: 2.4 10*3/uL (ref 0.7–4.0)
MCH: 27.6 pg (ref 26.0–34.0)
MCHC: 31.6 g/dL (ref 30.0–36.0)
MCV: 87.2 fL (ref 80.0–100.0)
Monocytes Absolute: 1.1 10*3/uL — ABNORMAL HIGH (ref 0.1–1.0)
Monocytes Relative: 11 %
Neutro Abs: 5.9 10*3/uL (ref 1.7–7.7)
Neutrophils Relative %: 61 %
Platelets: 236 10*3/uL (ref 150–400)
RBC: 4.06 MIL/uL — ABNORMAL LOW (ref 4.22–5.81)
RDW: 13.8 % (ref 11.5–15.5)
WBC: 9.6 10*3/uL (ref 4.0–10.5)
nRBC: 0 % (ref 0.0–0.2)

## 2020-10-16 LAB — GLUCOSE, CAPILLARY
Glucose-Capillary: 121 mg/dL — ABNORMAL HIGH (ref 70–99)
Glucose-Capillary: 115 mg/dL — ABNORMAL HIGH (ref 70–99)
Glucose-Capillary: 117 mg/dL — ABNORMAL HIGH (ref 70–99)
Glucose-Capillary: 110 mg/dL — ABNORMAL HIGH (ref 70–99)

## 2020-10-16 MED ORDER — GABAPENTIN 300 MG PO CAPS
300.0000 mg | ORAL_CAPSULE | Freq: Every day | ORAL | Status: DC
  Administered 2020-10-16 – 2020-10-18 (×3): 300 mg via ORAL
  Filled 2020-10-16 (×3): qty 1

## 2020-10-16 NOTE — Progress Notes (Signed)
PROGRESS NOTE  Kyle Montgomery:[id number] DOB: [birth year] DOA: 10/13/2020 PCP: Andrey Campanile, MD  Brief History   Kyle Montgomery is a 44 y.o. male with medical history significant for type 2 diabetes, hypertension, hyperlipidemia, bipolar disorder, CKD 3B, who presented to [facility] ED with complaints of left hip pain worse for the past 3 days.  States he has had left hip pain, off and on for at least a year.  The pain migrates from left hip to left knee to left ankle but mostly on the left side.  No chills or night sweats.  Associative difficulty with ambulation.  No cardiopulmonary or GI symptoms.  He has presented to the ED for the past 3 years or so for the same complaints.  He is accompanied by his mother who assists with providing a history.  Work-up in the ED revealed elevated CRP 11.0, sed rate 45, CT scan left pelvis revealed sclerotic focus in the left iliac bone has marginally increased in size over the past 2 years.  There is some adjacent sclerosis of the left sacroiliac joint with sacroiliac joint erosions.  Unclear if this lesion is related to the sacroiliac joint changes or there is separate sacroiliitis.  No fracture.  Bilateral fat-containing inguinal hernias.  T-max 100.2 in the ED, tachycardic.  Due to concern for septic joint, EDP started IV antibiotics empirically Rocephin and IV vancomycin.  Patient admitted under hospitalist service.   ED Course:  T-max 100.2.  BP 114/92, heart rate 110, respiratory 16, O2 saturation 95-100% on room air.  Lab studies remarkable for creatinine 2.38 from baseline of 2.08 in August 12, 2019.  CRP 11.0.  Sed rate 45.  The patient has been admitted to a telemetry bed. Orthopedic surgery has been consulted. And MRI of the hip has been ordered.  The patient is complaining of pain in feet with walking. This is likely due to neuropathy. Neurontin added. PT has evaluated the patient and recommends SNF. If he goes home, he will need a wheelchair for  safety.  Consultants  Orthopedic surgery  Procedures  None  Antibiotics   Anti-infectives (From admission, onward)    Start     Dose/Rate Route Frequency Ordered Stop   10/15/20 1030  vancomycin (VANCOCIN) IVPB 1000 mg/200 mL premix        1,000 mg 200 mL/hr over 60 Minutes Intravenous Every 24 hours 10/15/20 0940     10/14/20 2200  cefTRIAXone (ROCEPHIN) 2 g in sodium chloride 0.9 % 100 mL IVPB        2 g 200 mL/hr over 30 Minutes Intravenous Every 24 hours 10/13/20 2352     10/13/20 2315  cefTRIAXone (ROCEPHIN) 2 g in sodium chloride 0.9 % 100 mL IVPB        2 g 200 mL/hr over 30 Minutes Intravenous  Once 10/13/20 2306 10/14/20 0124   10/13/20 2315  vancomycin (VANCOREADY) IVPB 2000 mg/400 mL        2,000 mg 200 mL/hr over 120 Minutes Intravenous  Once 10/13/20 2308 10/14/20 0413   10/13/20 2307  vancomycin variable dose per unstable renal function (pharmacist dosing)  Status:  Discontinued         Does not apply See admin instructions 10/13/20 2308 10/15/20 0940      Subjective  The patient is resting comfortably. No new complaints.   Objective   Vitals:  Vitals:   10/16/20 0428 10/16/20 1049  BP: (!) 137/99 130/90  Pulse: (!) 105 (!) 107  Resp: 17 14  Temp: 99.8 F (37.7 C) 99.8 F (37.7 C)  SpO2: 95% 98%    Exam:  Constitutional:  The patient is awake, alert, and oriented x 3. No acute distress. Respiratory:  CTA bilaterally, no w/r/r.  Respiratory effort normal. No retractions or accessory muscle use Cardiovascular:  RRR, no m/r/g No LE extremity edema   Normal pedal pulses Abdomen:  Abdomen appears normal; no tenderness or masses No hernias No HSM Musculoskeletal:  Digits/nails BUE: no clubbing, cyanosis, petechiae, infection exam of joints, bones, muscles of at least one of following: head/neck, RUE, LUE, RLE, LLE   strength and tone normal, no atrophy, no abnormal movements Positive for tenderness in left hip. Patient complaining of pain in  feet bilaterally with walking. Skin:  No rashes, lesions, ulcers palpation of skin: no induration or nodules Neurologic:  CN 2-12 intact Sensation all 4 extremities intact Psychiatric:  Mental status Mood, affect appropriate Orientation to person, place, time  judgment and insight appear intact    I have personally reviewed the following:   Today's Data  Vitals  Lab Data  CBC, CMP  Micro Data  Blood cultures x 2: no growth  Imaging  MRI left hip is pending.  CT Pelvis without contrast  Scheduled Meds:  atorvastatin  40 mg Oral Daily   divalproex  500 mg Oral QHS   enoxaparin (LOVENOX) injection  40 mg Subcutaneous Q24H   gabapentin  300 mg Oral QHS   insulin aspart  0-5 Units Subcutaneous QHS   insulin aspart  0-9 Units Subcutaneous TID WC   ziprasidone  80 mg Oral BID   Continuous Infusions:  cefTRIAXone (ROCEPHIN)  IV 2 g (10/15/20 2016)   vancomycin 200 mL/hr at 10/16/20 1211    Active Problems:   Left hip pain   Septic arthritis (HCC)   LOS: 2 days   A & P  Left hip pain, rule out septic arthritis CT scan left pelvis revealed sclerotic focus in the left iliac bone has marginally increased in size over the past 2 years.  There is some adjacent sclerosis of the left sacroiliac joint with sacroiliac joint erosions. MRI hip has demonstrated small joint effusions of the hip and the SI joint. Orthopedic surgery consulted. CRP 11.0 Sed rate 45. Pain control IR consulted for possible joint aspiration Follow blood cultures x2 peripherally Started on Rocephin and IV vancomycin in the ED, continue. I appreciate orthopedic surgery's help.   Neuropathy: Patient complains of severe pain in feet bilaterally that makes it hard for him to walk. Neurontin started.   AKI on CKD 3B, suspect prerenal in the setting of dehydration Resolved. Creatinine is at baseline. Baseline creatinine appears to be 2.08 with GFR 43 Presented with creatinine of 2.38 with GFR of 33 Stop  IV fluid hydration LR at 50 cc/hr. Avoid nephrotoxic agents, dehydration and hypotension Closely monitor urine output. Monitor creatinine, electrolytes, and volume status.   Hypertension BP is currently at goal. Resume home oral antihypertensives.   Tachycardia, suspected in setting of dehydration Heart rate 107 from 110.   Hyperlipidemia Resume home statin   Type 2 diabetes with hyperlipidemia Obtain hemoglobin A1c in the morning Start Insulin sliding scale   Bipolar disorder Resume home Depakote and Geodon   Ambulatory dysfunction PT OT assessment Fall precautions  I have seen and examined this patient myself. I have spent 32 minutes in his evaluation and care.   Inpatient status DVT prophylaxis: Subcu Lovenox daily. Code Status: Full code.  Family Communication: None availability Disposition Plan: Admitted to MedSurg unit with remote telemetry. Consults called: Orthopedic surgery consulted by EDP.    Viktoriya Glaspy, DO [facility] Direct contact: see [email or web address]  7PM-7AM contact night coverage as above 10/16/2020, 3:58 PM  LOS: 0 days

## 2020-10-16 NOTE — Evaluation (Signed)
Physical Therapy Evaluation Patient Details Name: Kyle Montgomery MRN: [id number] DOB: 1975-12-11 Today's Date: 10/16/2020  History of Present Illness  Kyle Montgomery is a 45 y.o. male who presented to [facility] with complaints of left hip pain worse for the past 3 days; States he has had left hip pain, off and on for at least a year; labs and imaging concerning for iliac lesion, possible sacroilitis, possible septic joint.  Ortho f/u determines no surgical intervention necessary. PMH: significant for type 2 diabetes, hypertension, hyperlipidemia, bipolar disorder, CKD 3B.  Clinical Impression  Pt. Continues to demo inc difficulty with sit > stand requiring mod A x 2 to safely transfer to standing from chair height.  Pt. C/o inc B foot pain affecting mobility, but also demos significant quad weakness and dec activity tolerance placing him at inc risk for falls.  Pt. Is unsafe to return home at this time, and would benefit from rehab placement prior to return home.  If unable to be placed in rehab Montgomery, would need medical transport to enter home, and may need to consider a w/c for safety with func mobility.     Recommendations for follow up therapy are one component of a multi-disciplinary discharge planning process, led by the attending physician.  Recommendations may be updated based on patient status, additional functional criteria and insurance authorization.  Follow Up Recommendations SNF    Equipment Recommendations  Rolling walker with 5" wheels (if pt is unable to improve safety and IND with transfer and amb, may need to consider w/c for safety.)    Recommendations for Other Services       Precautions / Restrictions Precautions Precautions: Fall      Mobility  Bed Mobility                 Patient Response: Flat affect;Cooperative  Transfers Overall transfer level: Needs assistance Equipment used: Rolling walker (2 wheeled) Transfers: Sit to/from Stand Sit to Stand: Max  assist         General transfer comment: Pt. attempts sit > stand x 1 and is unable to complete transfer with minimal assist. PT attempts to educate pt. on use of momentum to assist with completing transfer, however, pt. is quick to try to stand again without following instruction.  Pt. req's mod A x 2 to complete transfer as pt. demos poor quad activation with transfer req'ing inc time and effort to transition to standing.  Pt. req's VCs for safety with hand placement during transfer.  Poor eccentric control with transfer back to chair. Fair standing balance with use of AD.  Ambulation/Gait Ambulation/Gait assistance: Min assist Gait Distance (Feet): 3 Feet Assistive device: Rolling walker (2 wheeled) Gait Pattern/deviations: Step-to pattern;Decreased step length - right;Decreased step length - left;Shuffle     General Gait Details: Pt. demos slow cadence with dec foot clearance and dec step length B.  Able to improve foot clearance with VCs to inc UE support on RW to assist.  Fatigues quickly with activity and develops upper body tremors.  2nd PT follows with bedside chair so that pt. can sit with fatigue. C/o inc foot pain limiting amb.  Stairs            Wheelchair Mobility    Modified Rankin (Stroke Patients Only)       Balance Overall balance assessment: Needs assistance         Standing balance support: During functional activity;Bilateral upper extremity supported Standing balance-Leahy Scale: Poor  Pertinent Vitals/Pain Pain Assessment: 0-10 Pain Score: 7  Pain Location: B feet; reports pain is improving with "fluid medicine" from NSG, down from 10/10.  Believes pain was caused by SCDs overnight. Pain increases with palpation and amb. Pain Descriptors / Indicators: Constant;Discomfort;Pressure;Aching Pain Intervention(s): Limited activity within patient's tolerance;Monitored during session;Repositioned;Other (comment)  (Helped pt. apply compression braces from home for B knees and feet)    Home Living                        Prior Function                 Hand Dominance        Extremity/Trunk Assessment                Communication      Cognition Arousal/Alertness: Awake/alert Behavior During Therapy: Flat affect Overall Cognitive Status: Difficult to assess                                 General Comments: Pt. is slow to respond at times, able to follow simple commands.      General Comments General comments (skin integrity, edema, etc.): Pt. demos swelling in feet, R > L.    Exercises General Exercises - Lower Extremity Ankle Circles/Pumps: AROM;Both;10 reps (Encouraged to complete ankle pumps throughout day to assist with swelling) Heel Slides: AAROM;Both;10 reps (Educated on use of gait belt to assist with strengthening and ROM throughout day) Straight Leg Raises: Both;10 reps;AAROM Other Exercises Other Exercises: Pt. found with LEs in dependent position.  Educated on elevating LEs during rest to dec swelling.   Assessment/Plan    PT Assessment    PT Problem List         PT Treatment Interventions      PT Goals (Current goals can be found in the Care Plan section)       Frequency     Barriers to discharge        Co-evaluation               AM-PAC PT "6 Clicks" Mobility  Outcome Measure Help needed turning from your back to your side while in a flat bed without using bedrails?: A Little Help needed moving from lying on your back to sitting on the side of a flat bed without using bedrails?: A Little Help needed moving to and from a bed to a chair (including a wheelchair)?: A Little Help needed standing up from a chair using your arms (e.g., wheelchair or bedside chair)?: A Lot Help needed to walk in hospital room?: A Little Help needed climbing 3-5 steps with a railing? : A Lot 6 Click Score: 16    End of Session Equipment  Utilized During Treatment: Gait belt Activity Tolerance: Patient limited by pain Patient left: in chair;with chair alarm set;with call bell/phone within reach        Time: 0947-1020 PT Time Calculation (min) (ACUTE ONLY): 33 min   Charges:     PT Treatments $Gait Training: 8-22 mins $Therapeutic Exercise: 8-22 mins        Adyen Bifulco A. Madelon Welsch, PT, DPT Acute Rehabilitation Services Office: [phone]   Virginio Isidore A Harmony Sandell 10/16/2020, 11:05 AM

## 2020-10-16 NOTE — Social Work (Signed)
CSW called pts mother to  complete assessment. She did not answer. CSW left message asking to return call. CSW will continue to follow.   Jimmy Picket, LCSW Clinical Social Worker

## 2020-10-17 DIAGNOSIS — M009 Pyogenic arthritis, unspecified: Secondary | ICD-10-CM | POA: Diagnosis not present

## 2020-10-17 DIAGNOSIS — E118 Type 2 diabetes mellitus with unspecified complications: Secondary | ICD-10-CM | POA: Diagnosis not present

## 2020-10-17 DIAGNOSIS — M25552 Pain in left hip: Secondary | ICD-10-CM | POA: Diagnosis not present

## 2020-10-17 DIAGNOSIS — F319 Bipolar disorder, unspecified: Secondary | ICD-10-CM | POA: Diagnosis not present

## 2020-10-17 LAB — GLUCOSE, CAPILLARY
Glucose-Capillary: 111 mg/dL — ABNORMAL HIGH (ref 70–99)
Glucose-Capillary: 103 mg/dL — ABNORMAL HIGH (ref 70–99)
Glucose-Capillary: 108 mg/dL — ABNORMAL HIGH (ref 70–99)

## 2020-10-17 LAB — CULTURE, BLOOD (ROUTINE X 2): Special Requests: ADEQUATE

## 2020-10-17 LAB — VANCOMYCIN, PEAK: Vancomycin Pk: 10 ug/mL — ABNORMAL LOW (ref 30–40)

## 2020-10-17 MED ORDER — AMLODIPINE BESYLATE 10 MG PO TABS
10.0000 mg | ORAL_TABLET | Freq: Every day | ORAL | Status: DC
Start: 2020-10-17 — End: 2020-10-19
  Administered 2020-10-17 – 2020-10-19 (×3): 10 mg via ORAL
  Filled 2020-10-17 (×3): qty 1

## 2020-10-17 MED ORDER — COVID-19MRNA BIVAL VACC PFIZER 30 MCG/0.3ML IM SUSP
0.3000 mL | Freq: Once | INTRAMUSCULAR | Status: DC
  Filled 2020-10-17: qty 0.3

## 2020-10-17 MED ORDER — INFLUENZA VAC SPLIT QUAD 0.5 ML IM SUSY
0.5000 mL | PREFILLED_SYRINGE | INTRAMUSCULAR | Status: AC
  Administered 2020-10-18: 0.5 mL via INTRAMUSCULAR
  Filled 2020-10-17: qty 0.5

## 2020-10-17 NOTE — NC FL2 (Signed)
[place] MEDICAID FL2 LEVEL OF CARE SCREENING TOOL     IDENTIFICATION  Patient Name: Kyle Montgomery Birthdate: [birth year] Sex: male Admission Date (Current Location): 10/13/2020  [place] and [place] Number:  Haynes Bast [id number] P Facility and Address:  [facility], [address], [place], [place] [address]      Provider Number: [id number]  Attending Physician Name and Address:  Fran Lowes, DO  Relative Name and Phone Number:  Margo Common [phone]    Current Level of Care: Hospital Recommended Level of Care: Skilled Nursing Facility Prior Approval Number:    Date Approved/Denied:   PASRR Number:    Discharge Plan: SNF    Current Diagnoses: Patient Active Problem List   Diagnosis Date Noted   Septic arthritis (HCC) 10/14/2020   Left hip pain 10/13/2020   Type 2 diabetes mellitus treated without insulin (HCC) 04/15/2017   Hyperlipidemia 09/06/2014   Healthcare maintenance 12/01/2012   CKD (chronic kidney disease), stage III (HCC) 09/01/2012   Bipolar affective (HCC) 11/06/2005   Essential hypertension 11/06/2005   Gynecomastia 11/06/2005    Orientation RESPIRATION BLADDER Height & Weight     Self, Time, Situation, Place  Normal Continent Weight: 96.2 kg Height:  5\' 10"  (177.8 cm)  BEHAVIORAL SYMPTOMS/MOOD NEUROLOGICAL BOWEL NUTRITION STATUS     (n/a) Continent Diet  AMBULATORY STATUS COMMUNICATION OF NEEDS Skin   Limited Assist Verbally Normal                       Personal Care Assistance Level of Assistance  Bathing, Feeding, Dressing Bathing Assistance: Limited assistance Feeding assistance: Independent Dressing Assistance: Limited assistance     Functional Limitations Info  Sight, Hearing, Speech Sight Info: Adequate Hearing Info: Adequate Speech Info: Adequate    SPECIAL CARE FACTORS FREQUENCY  PT (By licensed PT), OT (By licensed OT)     PT Frequency: 5X OT Frequency: 5X            Contractures  Contractures Info: Not present    Additional Factors Info  Code Status, Allergies, Psychotropic, Insulin Sliding Scale, Isolation Precautions, Suctioning Needs Code Status Info: Full Allergies Info: No Known Allergies Psychotropic Info: see d/c summary for psychotropic info Insulin Sliding Scale Info: see d/c summary for sliding scale info Isolation Precautions Info: n/a Suctioning Needs: n/a   Current Medications (10/17/2020):  This is the current hospital active medication list Current Facility-Administered Medications  Medication Dose Route Frequency Provider Last Rate Last Admin   acetaminophen (TYLENOL) tablet 650 mg  650 mg Oral Q6H PRN 10/19/2020 N, DO   650 mg at 10/15/20 1657   atorvastatin (LIPITOR) tablet 40 mg  40 mg Oral Daily 10/17/20 N, DO   40 mg at 10/17/20 1110   cefTRIAXone (ROCEPHIN) 2 g in sodium chloride 0.9 % 100 mL IVPB  2 g Intravenous Q24H 10/19/20 N, DO 200 mL/hr at 10/16/20 2055 2 g at 10/16/20 2055   divalproex (DEPAKOTE ER) 24 hr tablet 500 mg  500 mg Oral QHS 2056 N, DO   500 mg at 10/16/20 2050   enoxaparin (LOVENOX) injection 40 mg  40 mg Subcutaneous Q24H 10/18/20 N, DO   40 mg at 10/17/20 1111   gabapentin (NEURONTIN) capsule 300 mg  300 mg Oral QHS Swayze, Ava, DO   300 mg at 10/16/20 2100   HYDROmorphone (DILAUDID) injection 0.5 mg  0.5 mg Intravenous Q4H PRN 2101, DO  insulin aspart (novoLOG) injection 0-5 Units  0-5 Units Subcutaneous QHS Hall, Carole N, DO       insulin aspart (novoLOG) injection 0-9 Units  0-9 Units Subcutaneous TID WC Dow Adolph N, DO   1 Units at 10/16/20 3546   melatonin tablet 3 mg  3 mg Oral QHS PRN Dow Adolph N, DO       oxyCODONE (Oxy IR/ROXICODONE) immediate release tablet 5 mg  5 mg Oral Q6H PRN Dow Adolph N, DO   5 mg at 10/17/20 5681   polyethylene glycol (MIRALAX / GLYCOLAX) packet 17 g  17 g Oral Daily PRN Dow Adolph N, DO       vancomycin (VANCOCIN) IVPB 1000 mg/200 mL premix   1,000 mg Intravenous Q24H Ike Bene, RPH 200 mL/hr at 10/17/20 1118 1,000 mg at 10/17/20 1118   ziprasidone (GEODON) capsule 80 mg  80 mg Oral BID Darlin Drop, DO   80 mg at 10/17/20 1110     Discharge Medications: Please see discharge summary for a list of discharge medications.  Relevant Imaging Results:  Relevant Lab Results:   Additional Information SS# [id number] pfizer 10/20/19, 09/24/19  Beckie Busing, RN

## 2020-10-17 NOTE — TOC Initial Note (Signed)
Transition of Care [facility]) - Initial/Assessment Note    Patient Details  Name: Kyle Montgomery MRN: [id number] Date of Birth: [birth year]  Transition of Care [facility]) CM/SW Contact:    Jimmy Picket, LCSW Phone Number: 10/17/2020, 3:15 PM  Clinical Narrative:                  CSW spoke to pt at bedside. Pt stated CSW should talk to "Momma". Pts mother Patience Musca called CSW back. Sherlene stated that pt lives at home with her and he is able to complete ADL's without assistance.   CSW reviewed PT/OT recommendations for SNF. Sherlene feels that SNF is appropriate before he returns home. Sherlene gave CSW permission to fax out to facilities in the area. Sherlene has no preference of facility at this time. CSW gave pt medicare.gov rating list to review. CSW explained insurance auth process. Sherlene reports pt is covid vaccinated, no booster. Sherlene states its OK for pt to get booster if needed. Sherlene would like for pt to receive flu vaccine also.   CSW will continue to follow.   Expected Discharge Plan: Skilled Nursing Facility Barriers to Discharge: Continued Medical Work up, English as a second language teacher   Patient Goals and CMS Choice Patient states their goals for this hospitalization and ongoing recovery are:: Pts mother stated "to get better" CMS Medicare.gov Compare Post Acute Care list provided to:: Patient Choice offered to / list presented to : Patient  Expected Discharge Plan and Services Expected Discharge Plan: Skilled Nursing Facility       Living arrangements for the past 2 months: Single Family Home                                      Prior Living Arrangements/Services Living arrangements for the past 2 months: Single Family Home Lives with:: Parents Patient language and need for interpreter reviewed:: Yes Do you feel safe going back to the place where you live?: Yes      Need for Family Participation in Patient Care: Yes (Comment) Care giver support system in  place?: Yes (comment)   Criminal Activity/Legal Involvement Pertinent to Current Situation/Hospitalization: No - Comment as needed  Activities of Daily Living Home Assistive Devices/Equipment: Crutches ADL Screening (condition at time of admission) Patient's cognitive ability adequate to safely complete daily activities?: Yes Is the patient deaf or have difficulty hearing?: No Does the patient have difficulty seeing, even when wearing glasses/contacts?: No Does the patient have difficulty concentrating, remembering, or making decisions?: No Patient able to express need for assistance with ADLs?: Yes Does the patient have difficulty dressing or bathing?: No Independently performs ADLs?: Yes (appropriate for developmental age) Does the patient have difficulty walking or climbing stairs?: Yes Weakness of Legs: Left (Left Hip) Weakness of Arms/Hands: None  Permission Sought/Granted Permission sought to share information with : Facility Industrial/product designer granted to share information with : Yes, Verbal Permission Granted     Permission granted to share info w AGENCY: SNF        Emotional Assessment Appearance:: Appears stated age Attitude/Demeanor/Rapport: Engaged Affect (typically observed): Appropriate Orientation: : Oriented to Self, Oriented to Place, Oriented to  Time, Oriented to Situation (Developmentelly Delayed) Alcohol / Substance Use: Not Applicable Psych Involvement: No (comment)  Admission diagnosis:  Left hip pain [M25.552] Septic arthritis [facility]) [M00.9] Patient Active Problem List   Diagnosis Date Noted   Septic arthritis (HCC) 10/14/2020  Left hip pain 10/13/2020   Type 2 diabetes mellitus treated without insulin (HCC) 04/15/2017   Hyperlipidemia 09/06/2014   Healthcare maintenance 12/01/2012   CKD (chronic kidney disease), stage III (HCC) 09/01/2012   Bipolar affective (HCC) 11/06/2005   Essential hypertension 11/06/2005   Gynecomastia  11/06/2005   PCP:  Andrey Campanile, MD Pharmacy:   [facility] [phone] [place], [place] - [address] AT [facility] [address] [place] [address] Phone: [phone] Fax: [phone]  [id number] - [place], [place] - [address] AT [facility] OF [address] [facility] [address] [place] [address] Phone: [phone] Fax: [phone]     Social Determinants of Health (SDOH) Interventions    Readmission Risk Interventions No flowsheet data found.  Jimmy Picket, LCSW Clinical Social Worker

## 2020-10-17 NOTE — Progress Notes (Signed)
PROGRESS NOTE  [facility] XBD:[id number] DOB: 28-Jun-1975 DOA: 10/13/2020 PCP: Andrey Campanile, MD  Brief History   Kyle Montgomery is a 45 y.o. male with medical history significant for type 2 diabetes, hypertension, hyperlipidemia, bipolar disorder, CKD 3B, who presented to [facility] ED with complaints of left hip pain worse for the past 3 days.  States he has had left hip pain, off and on for at least a year.  The pain migrates from left hip to left knee to left ankle but mostly on the left side.  No chills or night sweats.  Associative difficulty with ambulation.  No cardiopulmonary or GI symptoms.  He has presented to the ED for the past 3 years or so for the same complaints.  He is accompanied by his mother who assists with providing a history.  Work-up in the ED revealed elevated CRP 11.0, sed rate 45, CT scan left pelvis revealed sclerotic focus in the left iliac bone has marginally increased in size over the past 2 years.  There is some adjacent sclerosis of the left sacroiliac joint with sacroiliac joint erosions.  Unclear if this lesion is related to the sacroiliac joint changes or there is separate sacroiliitis.  No fracture.  Bilateral fat-containing inguinal hernias.  T-max 100.2 in the ED, tachycardic.  Due to concern for septic joint, EDP started IV antibiotics empirically Rocephin and IV vancomycin.  Patient admitted under hospitalist service.   ED Course:  T-max 100.2.  BP 114/92, heart rate 110, respiratory 16, O2 saturation 95-100% on room air.  Lab studies remarkable for creatinine 2.38 from baseline of 2.08 in August 12, 2019.  CRP 11.0.  Sed rate 45.  The patient has been admitted to a telemetry bed. Orthopedic surgery has been consulted. And MRI of the hip has been ordered.  The patient is complaining of pain in feet with walking. This is likely due to neuropathy. Neurontin added. PT has evaluated the patient and recommends SNF. If he goes home, he will need a wheelchair for  safety.  Consultants  Orthopedic surgery  Procedures  None  Antibiotics   Anti-infectives (From admission, onward)    Start     Dose/Rate Route Frequency Ordered Stop   10/15/20 1030  vancomycin (VANCOCIN) IVPB 1000 mg/200 mL premix        1,000 mg 200 mL/hr over 60 Minutes Intravenous Every 24 hours 10/15/20 0940     10/14/20 2200  cefTRIAXone (ROCEPHIN) 2 g in sodium chloride 0.9 % 100 mL IVPB        2 g 200 mL/hr over 30 Minutes Intravenous Every 24 hours 10/13/20 2352     10/13/20 2315  cefTRIAXone (ROCEPHIN) 2 g in sodium chloride 0.9 % 100 mL IVPB        2 g 200 mL/hr over 30 Minutes Intravenous  Once 10/13/20 2306 10/14/20 0124   10/13/20 2315  vancomycin (VANCOREADY) IVPB 2000 mg/400 mL        2,000 mg 200 mL/hr over 120 Minutes Intravenous  Once 10/13/20 2308 10/14/20 0413   10/13/20 2307  vancomycin variable dose per unstable renal function (pharmacist dosing)  Status:  Discontinued         Does not apply See admin instructions 10/13/20 2308 10/15/20 0940      Subjective  The patient is resting comfortably. No new complaints.   Objective   Vitals:  Vitals:   10/17/20 0507 10/17/20 0810  BP: 134/82 (!) 134/92  Pulse: 91 (!) 101  Resp: 19 17  Temp: 98.2 F (36.8 C) 97.6 F (36.4 C)  SpO2: 93% 98%    Exam:  Constitutional:  The patient is awake, alert, and oriented x 3. No acute distress. Respiratory:  CTA bilaterally, no w/r/r.  Respiratory effort normal. No retractions or accessory muscle use Cardiovascular:  RRR, no m/r/g No LE extremity edema   Normal pedal pulses Abdomen:  Abdomen appears normal; no tenderness or masses No hernias No HSM Musculoskeletal:  Digits/nails BUE: no clubbing, cyanosis, petechiae, infection exam of joints, bones, muscles of at least one of following: head/neck, RUE, LUE, RLE, LLE   strength and tone normal, no atrophy, no abnormal movements Positive for tenderness in left hip. Patient complaining of pain in feet  bilaterally with walking. Skin:  No rashes, lesions, ulcers palpation of skin: no induration or nodules Neurologic:  CN 2-12 intact Sensation all 4 extremities intact Psychiatric:  Mental status Mood, affect appropriate Orientation to person, place, time  judgment and insight appear intact    I have personally reviewed the following:   Today's Data  Vitals  Lab Data  CBC, CMP  Micro Data  Blood cultures x 2: no growth  Imaging  MRI left hip is pending.  CT Pelvis without contrast  Scheduled Meds:  atorvastatin  40 mg Oral Daily   divalproex  500 mg Oral QHS   enoxaparin (LOVENOX) injection  40 mg Subcutaneous Q24H   gabapentin  300 mg Oral QHS   insulin aspart  0-5 Units Subcutaneous QHS   insulin aspart  0-9 Units Subcutaneous TID WC   ziprasidone  80 mg Oral BID   Continuous Infusions:  cefTRIAXone (ROCEPHIN)  IV 2 g (10/16/20 2055)   vancomycin 1,000 mg (10/17/20 1118)    Active Problems:   Left hip pain   Septic arthritis (HCC)   LOS: 3 days   A & P  Left hip pain, rule out septic arthritis CT scan left pelvis revealed sclerotic focus in the left iliac bone has marginally increased in size over the past 2 years.  There is some adjacent sclerosis of the left sacroiliac joint with sacroiliac joint erosions. MRI hip has demonstrated small joint effusions of the hip and the SI joint. Orthopedic surgery consulted. CRP 11.0 Sed rate 45. Pain control IR consulted for possible joint aspiration Follow blood cultures x2 peripherally Started on Rocephin and IV vancomycin in the ED, continue. I appreciate orthopedic surgery's help.   Neuropathy: Patient complains of severe pain in feet bilaterally that makes it hard for him to walk. Neurontin started.   AKI on CKD 3B, suspect prerenal in the setting of dehydration Resolved. Creatinine is at baseline. Baseline creatinine appears to be 2.08 with GFR 43 Presented with creatinine of 2.38 with GFR of 33.  Stop IV  fluid hydration LR at 50 cc/hr. Avoid nephrotoxic agents, dehydration and hypotension Closely monitor urine output. Monitor creatinine, electrolytes, and volume status.   Hypertension BP is currently at goal. Resume home oral antihypertensives.   Tachycardia, suspected in setting of dehydration Heart rate 107 from 110.   Hyperlipidemia Resume home statin   Type 2 diabetes with hyperlipidemia Obtain hemoglobin A1c in the morning Start Insulin sliding scale   Bipolar disorder Resume home Depakote and Geodon   Ambulatory dysfunction PT OT assessment Fall precautions  I have seen and examined this patient myself. I have spent 30 minutes in his evaluation and care.   Inpatient status DVT prophylaxis: Subcu Lovenox daily. Code Status: Full code.  Family Communication: None availability Disposition Plan: Home with home health vs SNF.  Consults called: Orthopedic surgery consulted by EDP.    Jolee Critcher, DO [facility] Direct contact: see [email or web address]  7PM-7AM contact night coverage as above 10/17/2020, 4:26 PM  LOS: 0 days

## 2020-10-18 DIAGNOSIS — M009 Pyogenic arthritis, unspecified: Secondary | ICD-10-CM

## 2020-10-18 LAB — GLUCOSE, CAPILLARY
Glucose-Capillary: 163 mg/dL — ABNORMAL HIGH (ref 70–99)
Glucose-Capillary: 89 mg/dL (ref 70–99)
Glucose-Capillary: 119 mg/dL — ABNORMAL HIGH (ref 70–99)
Glucose-Capillary: 130 mg/dL — ABNORMAL HIGH (ref 70–99)

## 2020-10-18 MED ORDER — AMOXICILLIN-POT CLAVULANATE 875-125 MG PO TABS
1.0000 | ORAL_TABLET | Freq: Two times a day (BID) | ORAL | Status: DC
  Administered 2020-10-18 – 2020-10-19 (×3): 1 via ORAL
  Filled 2020-10-18 (×3): qty 1

## 2020-10-18 MED ORDER — DOXYCYCLINE HYCLATE 100 MG PO TABS
100.0000 mg | ORAL_TABLET | Freq: Two times a day (BID) | ORAL | Status: DC
  Administered 2020-10-18 – 2020-10-19 (×3): 100 mg via ORAL
  Filled 2020-10-18 (×3): qty 1

## 2020-10-18 NOTE — TOC Initial Note (Addendum)
Transition of Care [facility]) - Initial/Assessment Note    Patient Details  Name: Kyle Montgomery MRN: [id number] Date of Birth: November 26, 1975  Transition of Care [facility]) CM/SW Contact:    Kingsley Plan, RN Phone Number: 10/18/2020, 10:13 AM  Clinical Narrative:                 Spoke to patient's mother Margo Common via phone. She has decided to take patient home at discharge.   Discussed IV ABX at home. Patient will have a infusion company [employer] . [employer] has a Engineer, civil (consulting) Pam who will provide teaching on IV ABX to patient and mother prior to discharge home. Patient will have a HHRN ( Pam will arrange) however HHRN will not be present every time a dose is due and family will need to assist. Sherlene voiced understanding.    Patient has crutches at home, only DME.   NCM discussed HHPT with Sherlene and explained it is difficult to get Medicaid patient's HH , however NCM will call agencies who cover their address.   Sherlene voiced understanding to all of above.   NCM secured chatted MD, and spoke to [facility]   Ordered DME will need to be delivered , ambulance will not transport DME    Patient now discharging on PO ABX . NCM updated Pam with [employer]   NCM exhausted list of home health agencies , unable to find accepting agency.  Cory with [facility] unable to accept.  Stacie with [facility] unable to accept.  Christy with [facility] unable to accept.  Okey Regal with [facility] unable to accept.  Erica with [facility] unable to accept.  [place] with [facility] unable to accept.  Carolyn with [facility] unable to accept.  Amy with [facility] unable to accept.   Kenzie with [facility] unable to accept.  Patient's mother Sherlene aware aware and is agreeable to SNF, however no bed offers at this time.   Expected Discharge Plan: Home w Home Health Services Barriers to Discharge: Continued Medical Work up, English as a second language teacher   Patient Goals and CMS Choice Patient states their goals for this  hospitalization and ongoing recovery are:: to go home CMS Medicare.gov Compare Post Acute Care list provided to:: Patient Choice offered to / list presented to : Patient, Parent Margo Common)  Expected Discharge Plan and Services Expected Discharge Plan: Home w Home Health Services     Post Acute Care Choice: Home Health Living arrangements for the past 2 months: Single Family Home                           HH Arranged: RN, PT          Prior Living Arrangements/Services Living arrangements for the past 2 months: Single Family Home Lives with:: Parents Patient language and need for interpreter reviewed:: Yes Do you feel safe going back to the place where you live?: Yes      Need for Family Participation in Patient Care: Yes (Comment) Care giver support system in place?: Yes (comment) Current home services: DME (Crutches) Criminal Activity/Legal Involvement Pertinent to Current Situation/Hospitalization: No - Comment as needed  Activities of Daily Living Home Assistive Devices/Equipment: Crutches ADL Screening (condition at time of admission) Patient's cognitive ability adequate to safely complete daily activities?: Yes Is the patient deaf or have difficulty hearing?: No Does the patient have difficulty seeing, even when wearing glasses/contacts?: No Does the patient have difficulty concentrating, remembering, or making decisions?: No Patient able to  express need for assistance with ADLs?: Yes Does the patient have difficulty dressing or bathing?: No Independently performs ADLs?: Yes (appropriate for developmental age) Does the patient have difficulty walking or climbing stairs?: Yes Weakness of Legs: Left (Left Hip) Weakness of Arms/Hands: None  Permission Sought/Granted Permission sought to share information with : Facility Industrial/product designer granted to share information with : Yes, Verbal Permission Granted  Share Information with NAME: Margo Common  Permission granted to share info w AGENCY: SNF        Emotional Assessment Appearance:: Appears stated age Attitude/Demeanor/Rapport: Engaged Affect (typically observed): Appropriate Orientation: : Oriented to Self, Oriented to Place, Oriented to  Time, Oriented to Situation (Developmentelly Delayed) Alcohol / Substance Use: Not Applicable Psych Involvement: No (comment)  Admission diagnosis:  Left hip pain [M25.552] Septic arthritis [facility]) [M00.9] Patient Active Problem List   Diagnosis Date Noted   Septic arthritis (HCC) 10/14/2020   Left hip pain 10/13/2020   Type 2 diabetes mellitus treated without insulin (HCC) 04/15/2017   Hyperlipidemia 09/06/2014   Healthcare maintenance 12/01/2012   CKD (chronic kidney disease), stage III (HCC) 09/01/2012   Bipolar affective (HCC) 11/06/2005   Essential hypertension 11/06/2005   Gynecomastia 11/06/2005   PCP:  Andrey Campanile, MD Pharmacy:   [facility] [phone] [place], [place] - [address] AT [facility] [address] [place] [address] Phone: [phone] Fax: [phone]  [id number] - [place], [place] - [address] AT [facility] [address] [place] [address] Phone: [phone] Fax: [phone]     Social Determinants of Health (SDOH) Interventions    Readmission Risk Interventions No flowsheet data found.

## 2020-10-18 NOTE — Progress Notes (Signed)
Physical Therapy Treatment Patient Details Name: Kyle Montgomery MRN: [id number] DOB: 11-01-1975 Today's Date: 10/18/2020   History of Present Illness Kyle Montgomery is a 45 y.o. male who presented to [facility] ED with complaints of left hip pain worse for the past 3 days; States he has had left hip pain, off and on for at least a year; labs and imaging concerning for iliac lesion, possible sacroilitis, possible septic joint.  Ortho f/u determines no surgical intervention necessary. PMH: significant for type 2 diabetes, hypertension, hyperlipidemia, bipolar disorder, CKD 3B.    PT Comments    Pt. Reports B foot pain is better, but now c/o intense L hip pain limiting functional mobility.  Pt. Demos poor motivation to participate, but with inc encouragement is able to sit up EOB with mod IND and complete LE therex with AA.  Unable to progress OOB mobility at this time due to pt refusal/pain.  In order for pt. To safely return home, will need medical transport to get into home, RW, w/c, BSC and mother will need to be able to provide mod A for safety with sit > stand.  Pt. Would benefit from continued skilled PT in acute care to work on improving LE strength and IND with transfers to improve safety with D/C plan for home.  Recommendations for follow up therapy are one component of a multi-disciplinary discharge planning process, led by the attending physician.  Recommendations may be updated based on patient status, additional functional criteria and insurance authorization.  Follow Up Recommendations  SNF (Pt. would benefit from SNF, but difficult placement due to insurance.  If returning home, will need inc assist from mother - especially with sit > stand.  Cannot tolerate ambulation, will need w/c.)     Equipment Recommendations  Rolling walker with 5" wheels;3in1 (PT);Wheelchair (measurements PT)    Recommendations for Other Services       Precautions / Restrictions Precautions Precautions: None      Mobility  Bed Mobility         Supine to sit: Modified independent (Device/Increase time)     General bed mobility comments: Pt. transfers to EOB with mod IND and use of bed rails.  Transfer BTB in same fashion, using UEs to lift LEs into bed.  Able to reposition in bed with mod IND. Patient Response: Flat affect  Transfers                    Ambulation/Gait                 Stairs             Wheelchair Mobility    Modified Rankin (Stroke Patients Only)       Balance   Sitting-balance support: Feet supported Sitting balance-Leahy Scale: Good                                      Cognition Arousal/Alertness: Awake/alert Behavior During Therapy: Flat affect Overall Cognitive Status: Difficult to assess                                 General Comments: Pt. initially refuses PT, states that he is in too much pain and is trying to rest.  With inc encouragement, pt. eventually agrees to sit EOB for therex.  Exercises General Exercises - Lower Extremity Ankle Circles/Pumps: AROM;Both;10 reps;Seated Long Arc Quad: AROM;Both;10 reps Heel Slides: AAROM;Both;10 reps Hip ABduction/ADduction: AROM;Both;10 reps Straight Leg Raises: AAROM;Both;10 reps Hip Flexion/Marching: AROM;Both;10 reps    General Comments General comments (skin integrity, edema, etc.): Pt. is educated on improtance of functional mobility to increase strength so he can return home and to PLOF.      Pertinent Vitals/Pain Pain Assessment: 0-10 Pain Score: 8  Pain Location: Pt's states pain is "excrutiating" in L hip today, but is noted to be lying comfortably in bed.  Denies any pain in B feet today. Pain Descriptors / Indicators: Aching;Discomfort;Constant Pain Intervention(s): Limited activity within patient's tolerance    Home Living                      Prior Function            PT Goals (current goals can now be found in  the care plan section) Progress towards PT goals: Not progressing toward goals - comment (Pt. continues to be limited by pain, unable to progress amb today)    Frequency           PT Plan      Co-evaluation              AM-PAC PT "6 Clicks" Mobility   Outcome Measure  Help needed turning from your back to your side while in a flat bed without using bedrails?: A Little Help needed moving from lying on your back to sitting on the side of a flat bed without using bedrails?: A Little Help needed moving to and from a bed to a chair (including a wheelchair)?: A Lot Help needed standing up from a chair using your arms (e.g., wheelchair or bedside chair)?: A Lot Help needed to walk in hospital room?: A Lot Help needed climbing 3-5 steps with a railing? : Total 6 Click Score: 13    End of Session   Activity Tolerance: Patient limited by pain Patient left: in bed;with call bell/phone within reach;with bed alarm set         Time: 8250-5397 PT Time Calculation (min) (ACUTE ONLY): 20 min  Charges:  $Therapeutic Exercise: 8-22 mins                     Liv Rallis A. Cheria Sadiq, PT, DPT Acute Rehabilitation Services Office: [phone]    Altus Zaino A Lucian Baswell 10/18/2020, 1:05 PM

## 2020-10-18 NOTE — Progress Notes (Signed)
Patient refused his Covid vaccine that was offered today.

## 2020-10-18 NOTE — Progress Notes (Addendum)
Subjective:  Kyle Montgomery is a 45 year old male living with type 2 diabetes, hypertension, hyperlipidemia, bipolar disorder, CKD stage IIIb who presented to [facility] ED with complaints of left hip pain worse for the past 2 days.  Patient has been worked up for septic arthritis.  CT scan left pelvis and MRI hip.  Patient had small effusion of hip and SI joint.  Orthopedic surgery consulted and treating for septic arthritis of SI joint. [facility] patient 9/23, requested IM teaching service to take over care of patient today as he is followed in our clinic.  Patient is currently pain-free at rest sitting in bed.  He has pain when trying to ambulate in his left hip and lower back.  Reports feeling weak and having to take short steps.  Discussed with patient and his mother today. He had a fall about 2 weeks ago down 6 steps. He was able to walk after , but increased pain started immediately after. It worsened over the week and patient ask mother to take him to the doctor. Mother also mentions he has had chronic pain on left side and came to ED before. Review of records show left foot pain without fracture and treated conservatively. He denies any fever, chills, or sob.   Objective:  Vital signs in last 24 hours: Vitals:   10/17/20 1633 10/17/20 1645 10/17/20 2038 10/18/20 0447  BP: (!) 142/100 126/87 119/80 130/88  Pulse: (!) 105  96 (!) 101  Resp: _0 Temp: 98.4 F (36.9 C)  98.5 F (36.9 C) 98.6 F (37 C)  TempSrc: Oral  Oral Oral  SpO2: 94%  100% 95%  Weight:      Height:       General: Appears weak when asked to sit up in bed, cooperative.  HE: Normocephalic, atraumatic , Conjunctivae normal Cardiovascular: Normal rate, regular rhythm.  No murmurs, rubs, or gallops Pulmonary : Effort normal, breath sounds normal. No wheezes, rales, or rhonchi Abdominal: soft, nontender,  bowel sounds present Musculoskeletal: Left hip -No deformity. FROM with 5/5 strength.No  tenderness to palpation.NVI distally.Negative faber. No tenderness over spine or sacral iliac joints.  Skin: Warm, dry , no bruising, erythema, or rash  Assessment/Plan:  Active Problems:   Left hip pain   Septic arthritis ([place])   Kyle Montgomery is a 45 y.o. with PMH of T2DM, HTN, HLD, bipolar disorder, CKD stage IIIb admitted for left hip pain raising concern for septic arthritis on hospital day 4  #Septic arthritis of Left SI joint - Blood cultures 9/23 show Staph Saprophyticus in 1/4 bottles. Presented with mild leukocytosis and fever.  -Ortho consulted early in admission and reviewed imaging.  Recommended treating left SI joint for septic arthritis.  Aspiration of joint was not obtained as patient was already improving and no surgical intervention needed. Discussed case and reviewed imaging with IR today, no abscess or fluid to obtain culture data.   - Unknown etiology for infection. At this time I favor treating as septic arthritis of small joint given presenting signs ( mild leukocytosis and fever). Patient had elevated ESR and CRP. Timing of fall and symptoms suggest be inflammation from fall.  -Day 6 of vancomycin and ceftriaxone. Held IV antibiotic this AM in anticipation of possible procedure. Will switch to oral antibiotics given patient has no collection of fluid available for culture data.  - Day 1 of 10 of Doxycycline and Augmentin  - PT/OT  #Hypertension - Patients home  regimen is amlodipine 10 mg and lisinopril-HCTZ 20-12.5  - BP at goal on amlodipine 10 mg, continue this medication for now .   #HLD - Continue home medication , atorvastatin 40 mg daily   #Bipolar disorder - continue home Depakote and Geodon  #Type 2 diabetes - Hemoglobin A1c 5.9 - Patient has not been taking home semaglutide due to cost of medication - continue SSI, requiring minimal units   DVT prophx: Lovenox Diet: HH CM Bowel: MiraLAX as needed Code: Full  Dispo: Medically stable for  discharge, waiting bed offer at SNF.      Kyle Snider, MD Internal Medicine PGY-3  Pager: [phone] After 5pm on weekdays and 1pm on weekends: On Call pager [phone]

## 2020-10-18 NOTE — Progress Notes (Signed)
Patient is followed in the [facility]. [facility] request we take over care today. We will take over as primary team this morning.   Thurmon Fair, MD PGY3 Internal Medicine [phone]

## 2020-10-18 NOTE — Care Management (Signed)
    Durable Medical Equipment  (From admission, onward)           Start     Ordered   10/18/20 1300  For home use only DME standard manual wheelchair with seat cushion  Once       Comments: Patient suffers from Left hip pain, rule out septic arthritis which impairs their ability to perform daily activities like ambulating  in the home.  A cane will not resolve issue with performing activities of daily living. A wheelchair will allow patient to safely perform daily activities. Patient can safely propel the wheelchair in the home or has a caregiver who can provide assistance. Length of need life time . Accessories: elevating leg rests (ELRs), wheel locks, extensions and anti-tippers.   Seat and back cushions   Call Mother for delivery   10/18/20 1300   10/18/20 1259  For home use only DME 3 n 1  Once        10/18/20 1259   10/18/20 1259  For home use only DME Shower stool  Once        10/18/20 1259   10/18/20 1258  For home use only DME Walker rolling  Once       Question Answer Comment  Walker: With 5 Inch Wheels   Patient needs a walker to treat with the following condition Weakness      10/18/20 1259

## 2020-10-19 DIAGNOSIS — M25552 Pain in left hip: Secondary | ICD-10-CM | POA: Diagnosis not present

## 2020-10-19 DIAGNOSIS — M009 Pyogenic arthritis, unspecified: Secondary | ICD-10-CM | POA: Diagnosis not present

## 2020-10-19 LAB — CBC WITH DIFFERENTIAL/PLATELET
Abs Immature Granulocytes: 0.03 10*3/uL (ref 0.00–0.07)
Basophils Absolute: 0.1 10*3/uL (ref 0.0–0.1)
Basophils Relative: 1 %
Eosinophils Absolute: 0.3 10*3/uL (ref 0.0–0.5)
Eosinophils Relative: 3 %
HCT: 36.3 % — ABNORMAL LOW (ref 39.0–52.0)
Hemoglobin: 11.5 g/dL — ABNORMAL LOW (ref 13.0–17.0)
Immature Granulocytes: 0 %
Lymphocytes Relative: 19 %
Lymphs Abs: 1.7 10*3/uL (ref 0.7–4.0)
MCH: 27.7 pg (ref 26.0–34.0)
MCHC: 31.7 g/dL (ref 30.0–36.0)
MCV: 87.5 fL (ref 80.0–100.0)
Monocytes Absolute: 0.9 10*3/uL (ref 0.1–1.0)
Monocytes Relative: 9 %
Neutro Abs: 6.2 10*3/uL (ref 1.7–7.7)
Neutrophils Relative %: 68 %
Platelets: 304 10*3/uL (ref 150–400)
RBC: 4.15 MIL/uL — ABNORMAL LOW (ref 4.22–5.81)
RDW: 13.5 % (ref 11.5–15.5)
WBC: 9.1 10*3/uL (ref 4.0–10.5)
nRBC: 0 % (ref 0.0–0.2)

## 2020-10-19 LAB — CULTURE, BLOOD (ROUTINE X 2): Culture: NO GROWTH

## 2020-10-19 LAB — COMPREHENSIVE METABOLIC PANEL
ALT: 58 U/L — ABNORMAL HIGH (ref 0–44)
AST: 44 U/L — ABNORMAL HIGH (ref 15–41)
Albumin: 2.7 g/dL — ABNORMAL LOW (ref 3.5–5.0)
Alkaline Phosphatase: 60 U/L (ref 38–126)
Anion gap: 12 (ref 5–15)
BUN: 27 mg/dL — ABNORMAL HIGH (ref 6–20)
CO2: 24 mmol/L (ref 22–32)
Calcium: 9.7 mg/dL (ref 8.9–10.3)
Chloride: 105 mmol/L (ref 98–111)
Creatinine, Ser: 1.96 mg/dL — ABNORMAL HIGH (ref 0.61–1.24)
GFR, Estimated: 42 mL/min — ABNORMAL LOW (ref >60)
Glucose, Bld: 117 mg/dL — ABNORMAL HIGH (ref 70–99)
Potassium: 3.8 mmol/L (ref 3.5–5.1)
Sodium: 141 mmol/L (ref 135–145)
Total Bilirubin: 0.6 mg/dL (ref 0.3–1.2)
Total Protein: 7.6 g/dL (ref 6.5–8.1)

## 2020-10-19 LAB — GLUCOSE, CAPILLARY: Glucose-Capillary: 142 mg/dL — ABNORMAL HIGH (ref 70–99)

## 2020-10-19 MED ORDER — OXYCODONE HCL 5 MG PO TABS: 5.0000 mg | ORAL_TABLET | Freq: Four times a day (QID) | ORAL | 0 refills | Status: DC | PRN

## 2020-10-19 MED ORDER — GABAPENTIN 300 MG PO CAPS: 300.0000 mg | ORAL_CAPSULE | Freq: Every day | ORAL | 0 refills | Status: DC

## 2020-10-19 MED ORDER — AMOXICILLIN-POT CLAVULANATE 875-125 MG PO TABS: 1.0000 | ORAL_TABLET | Freq: Two times a day (BID) | ORAL | 0 refills | Status: DC

## 2020-10-19 MED ORDER — DOXYCYCLINE HYCLATE 100 MG PO TABS: 100.0000 mg | ORAL_TABLET | Freq: Two times a day (BID) | ORAL | 0 refills | Status: DC

## 2020-10-19 MED ORDER — MELATONIN 3 MG PO TABS: 3.0000 mg | ORAL_TABLET | Freq: Every evening | ORAL | 0 refills | Status: AC | PRN

## 2020-10-19 MED ORDER — POLYETHYLENE GLYCOL 3350 17 G PO PACK: 17.0000 g | PACK | Freq: Every day | ORAL | 0 refills | Status: DC | PRN

## 2020-10-19 MED ORDER — ZIPRASIDONE HCL 80 MG PO CAPS: 80.0000 mg | ORAL_CAPSULE | Freq: Two times a day (BID) | ORAL | 2 refills | Status: DC

## 2020-10-19 NOTE — Progress Notes (Signed)
Physical Therapy Treatment Patient Details Name: Kyle Montgomery MRN: [id number] DOB: 23-Jan-1975 Today's Date: 10/19/2020   History of Present Illness Kyle Montgomery is a 45 y.o. male who presented to [facility] ED with complaints of left hip pain worse for the past 3 days; States he has had left hip pain, off and on for at least a year; labs and imaging concerning for iliac lesion, possible sacroilitis, possible septic joint.  Ortho f/u determines no surgical intervention necessary. PMH: significant for type 2 diabetes, hypertension, hyperlipidemia, bipolar disorder, CKD 3B.    PT Comments    Pt. Continues to demo dec motivation to perform functional mobility requiring max encouragement to try transfers OOB.  Pt. Demos LE weakness and slow processing requring inc time and continued max support to safely stand.  Once in standing, able to amb short distances with CGA for safety.  Pt. Would benefit from continued skill PT in acute care to work on improving IND and safety with transfers and to appropriately train mother in how to assist patient at home.   Recommendations for follow up therapy are one component of a multi-disciplinary discharge planning process, led by the attending physician.  Recommendations may be updated based on patient status, additional functional criteria and insurance authorization.  Follow Up Recommendations        Equipment Recommendations  Rolling walker with 5" wheels;3in1 (PT);Wheelchair (measurements PT)    Recommendations for Other Services       Precautions / Restrictions Precautions Precautions: None     Mobility  Bed Mobility Overal bed mobility: Modified Independent;Needs Assistance Bed Mobility: Supine to Sit;Sit to Supine     Supine to sit: Min assist Sit to supine: Modified independent (Device/Increase time)   General bed mobility comments: Pt. is laying lower in bed today resulting in inc difficulty with completing sup > sit with mod I.  Pt. initially  refuses any help from PT, but after inc time and failed attempts to complete activity with mod I, pt. is agreeable to allow PT to assist from trunk and with LEs.  Pt. completes activity with min A.  Demos good sitting balance EOB. Patient Response: Flat affect  Transfers   Equipment used: Rolling walker (2 wheeled) Transfers: Sit to/from Stand Sit to Stand: Max assist Stand pivot transfers: Min guard       General transfer comment: Pt. completes sit > stand with max A with VC/TC to push up with 1 hand from bed.  Pt. demos good standing balance once upright with use of RW.  Pt. able to take small steps over to chair with CGA only.  Ambulation/Gait Ambulation/Gait assistance: Min guard Gait Distance (Feet): 2 Feet Assistive device: Rolling walker (2 wheeled) Gait Pattern/deviations: Shuffle;Decreased step length - left;Decreased step length - right     General Gait Details: Pt. demos dec step length with shuffling gait/poor foot clearance.  Able to complete with CGA only to pivot over to chair.   Stairs             Wheelchair Mobility    Modified Rankin (Stroke Patients Only)       Balance           Standing balance support: During functional activity;Bilateral upper extremity supported Standing balance-Leahy Scale: Poor                              Cognition Arousal/Alertness: Awake/alert Behavior During Therapy: Flat affect Overall Cognitive  Status: Difficult to assess                                 General Comments: Pt. is reluctant to work with PT.  Wants to stay in bed and rest.  Needs inc encouragement to attempt standing/transfers.      Exercises      General Comments General comments (skin integrity, edema, etc.): Mom is contacted and PT requests that she come into hospital to practice sit > stand with pt. to ensure safety with return home.      Pertinent Vitals/Pain Pain Assessment: 0-10 Pain Score: 6  Faces Pain  Scale: Hurts little more Pain Location: L hip Pain Descriptors / Indicators: Aching;Discomfort Pain Intervention(s): Limited activity within patient's tolerance;Patient requesting pain meds-RN notified;Repositioned    Home Living                      Prior Function            PT Goals (current goals can now be found in the care plan section) Acute Rehab PT Goals Patient Stated Goal: less pain Progress towards PT goals: Not progressing toward goals - comment (Pt. continues to demo inc difficulty with sit > stand and dec motivation to progress func mobility.)    Frequency           PT Plan      Co-evaluation              AM-PAC PT "6 Clicks" Mobility   Outcome Measure  Help needed turning from your back to your side while in a flat bed without using bedrails?: None Help needed moving from lying on your back to sitting on the side of a flat bed without using bedrails?: A Little Help needed moving to and from a bed to a chair (including a wheelchair)?: A Little Help needed standing up from a chair using your arms (e.g., wheelchair or bedside chair)?: A Lot Help needed to walk in hospital room?: A Little Help needed climbing 3-5 steps with a railing? : Total 6 Click Score: 16    End of Session Equipment Utilized During Treatment: Gait belt Activity Tolerance: Patient limited by pain;Other (comment) (Pt. is fearful of activity, needs inc encouragement) Patient left: in chair;with call bell/phone within reach;with chair alarm set         Time: 9244-6286 PT Time Calculation (min) (ACUTE ONLY): 53 min  Charges:  $Gait Training: 8-22 mins $Therapeutic Activity: 38-52 mins                    Kyle Montgomery A. Kyle Montgomery, PT, DPT Acute Rehabilitation Services Office: [phone]    Kyle Montgomery A Kyle Montgomery 10/19/2020, 1:14 PM

## 2020-10-19 NOTE — Progress Notes (Signed)
Occupational Therapy Treatment Patient Details Name: Kyle Montgomery MRN: [id number] DOB: 06-27-1975 Today's Date: 10/19/2020   History of present illness Kyle Montgomery is a 45 y.o. male who presented to [facility] ED with complaints of left hip pain worse for the past 3 days; States he has had left hip pain, off and on for at least a year; labs and imaging concerning for iliac lesion, possible sacroilitis, possible septic joint.  Ortho f/u determines no surgical intervention necessary. PMH: significant for type 2 diabetes, hypertension, hyperlipidemia, bipolar disorder, CKD 3B.   OT comments  Patient received in bed and was agreeable to perform self care but did not want to stand due to belief that it would be too painful. Patient was able to get to eob with extra time and was able to perform grooming and UB bathing with setup. Therapist attempted to encourage patient to stand to address doffing brief and performing perineal bathing while standing. Patient refused to stand and stated he could perform seated. Patient performed weight shifting to perform with mod assist.  Patient returned to supine with increased time. Acute OT to continue to follow.    Recommendations for follow up therapy are one component of a multi-disciplinary discharge planning process, led by the attending physician.  Recommendations may be updated based on patient status, additional functional criteria and insurance authorization.    Follow Up Recommendations  SNF;Supervision/Assistance - 24 hour    Equipment Recommendations  3 in 1 bedside commode;Tub/shower seat;Wheelchair (measurements OT);Wheelchair cushion (measurements OT)    Recommendations for Other Services      Precautions / Restrictions Precautions Precautions: None Restrictions Weight Bearing Restrictions: No       Mobility Bed Mobility Overal bed mobility: Modified Independent;Needs Assistance Bed Mobility: Supine to Sit;Sit to Supine     Supine to sit:  Modified independent (Device/Increase time) Sit to supine: Modified independent (Device/Increase time)   General bed mobility comments: Patient was able to performed with increased time    Transfers                      Balance                                           ADL either performed or assessed with clinical judgement   ADL Overall ADL's : Needs assistance/impaired     Grooming: Set up;Sitting;Wash/dry face;Wash/dry Programmer, applications Details (indicate cue type and reason): performed seated on eob Upper Body Bathing: Set up;Sitting Upper Body Bathing Details (indicate cue type and reason): from eob Lower Body Bathing: Moderate assistance;Sitting/lateral leans Lower Body Bathing Details (indicate cue type and reason): Patient perfromed lateral leaning for LB bathing Upper Body Dressing : Set up;Sitting Upper Body Dressing Details (indicate cue type and reason): from eob Lower Body Dressing: Moderate assistance;Sitting/lateral leans Lower Body Dressing Details (indicate cue type and reason): doffed and donned pull up brief while seated on eob and performed lateral leaning               General ADL Comments: Patient performed self care sitting on eob and would not stand for perineal cleaning or changing brief     Vision       Perception     Praxis      Cognition Arousal/Alertness: Awake/alert Behavior During Therapy: Flat affect Overall Cognitive Status: Difficult to assess  General Comments: Patient agreed to work with OT sitting on eob for self care but would not stand        Exercises     Shoulder Instructions       General Comments      Pertinent Vitals/ Pain       Pain Assessment: Faces Faces Pain Scale: Hurts little more Pain Location:  (States he has no pain if not standing but grimaced during bed mobility for LLE) Pain Descriptors / Indicators: Grimacing Pain  Intervention(s): Repositioned;Monitored during session  Home Living                                          Prior Functioning/Environment              Frequency  Min 2X/week        Progress Toward Goals  OT Goals(current goals can now be found in the care plan section)  Progress towards OT goals: Progressing toward goals  Acute Rehab OT Goals Patient Stated Goal: less pain OT Goal Formulation: With patient Time For Goal Achievement: 10/28/20 Potential to Achieve Goals: Fair ADL Goals Pt Will Perform Grooming: with supervision;standing Pt Will Perform Lower Body Bathing: with min guard assist;sit to/from stand Pt Will Perform Lower Body Dressing: with min guard assist;sit to/from stand Pt Will Transfer to Toilet: with min guard assist;ambulating Pt Will Perform Toileting - Clothing Manipulation and hygiene: with supervision;sitting/lateral leans  Plan Discharge plan remains appropriate    Co-evaluation                 AM-PAC OT "6 Clicks" Daily Activity     Outcome Measure   Help from another person eating meals?: None Help from another person taking care of personal grooming?: A Little Help from another person toileting, which includes using toliet, bedpan, or urinal?: A Lot Help from another person bathing (including washing, rinsing, drying)?: A Lot Help from another person to put on and taking off regular upper body clothing?: None Help from another person to put on and taking off regular lower body clothing?: A Lot 6 Click Score: 17    End of Session    OT Visit Diagnosis: Unsteadiness on feet (R26.81);Other abnormalities of gait and mobility (R26.89);Muscle weakness (generalized) (M62.81);Pain   Activity Tolerance Patient tolerated treatment well   Patient Left in bed;with call bell/phone within reach;with bed alarm set   Nurse Communication Mobility status        Time: 9507-2257 OT Time Calculation (min): 23  min  Charges: OT General Charges $OT Visit: 1 Visit OT Treatments $Self Care/Home Management : 23-37 mins  Alfonse Flavors, OTA   Railyn House Jeannett Senior 10/19/2020, 10:32 AM

## 2020-10-19 NOTE — Hospital Course (Signed)
#  Septic arthritis of Left SI joint Patient initially presented with several weeks of left hip pain that had worsened over the last several days requiring crutches to walk due to painful ambulation.  He was tachycardic with low-grade fever on presentation and had decreased range of motion to the left hip.  He has been noted to have sclerotic lesion in the pelvis previously with repeat CT pelvis showing sclerotic focus in the left iliac bone that has marginally increased in size over the last 2 years; adjacent sclerosis of the left sacroiliac joint with sacroiliac joint erosions; no fracture; bilateral fat-containing inguinal hernias.  Concerns for possible septic joint were also raised at this time.  He had elevated ESR of 45 and CRP of 11 on presentation.  Blood cultures were drawn and showed growth of staph saprophyticus.  At that time he was given empirical Rocephin and IV vancomycin.  Orthopedic surgery was consulted and agreed with the plan of antibiotic treatment and consulting IR for possible arthrocentesis; they did not feel that there is abscess or fluid to obtain culture data from.  Orthopedic surgery evaluated the patient on 9/25 and felt that the patient was responding well to empiric antibiotic treatment and felt that there was no concern for septic arthritis of the left hip joint based on physical exam and imaging findings.  Due to complaints of bilateral lower extremity neuropathy, Neurontin was started on 9/27.  Patient was transition to p.o. doxycycline and Augmentin on 9/28. Physical therapy evaluation recommended home health physical therapy, DME including rolling walker with 5 inch wheels, 3 in 1.  #AKI on CKD 3B Baseline creatinine appears to be around 2.08 with GFR 43; on presentation patient had a creatinine of 2.38 with GFR of 33.  He was initiated on gentle hydration with LR at 50 cc/h which was discontinued on 9/25.  In this time he also had resolution of AKI with creatinine of 1.96 on  day of discharge.  #Hypertension Patient remained normotensive on home amlodipine 10 mg daily.   #HLD Chronically managed.  He was continued on home atorvastatin 40 mg daily.   #Bipolar disorder Chronically managed.  He was continued on home Depakote and Geodon.   #Type 2 diabetes HbA1c found to be 5.9%.  He was managed with sliding scale insulin throughout this admission, requiring minimal units.

## 2020-10-19 NOTE — Progress Notes (Signed)
Physical Therapy Treatment Patient Details Name: [name] MRN: [id number] DOB: 05/09/1975 Today's Date: 10/19/2020   History of Present Illness Kyle Montgomery is a 45 y.o. male who presented to [facility] ED with complaints of left hip pain worse for the past 3 days; States he has had left hip pain, off and on for at least a year; labs and imaging concerning for iliac lesion, possible sacroilitis, possible septic joint.  Ortho f/u determines no surgical intervention necessary. PMH: significant for type 2 diabetes, hypertension, hyperlipidemia, bipolar disorder, CKD 3B.    PT Comments    Mother educated on transfer safety and use of gait belt.  With increased practice and use of momentum, pt. Able to progress from max A to min A for sit > stand with assistance from mother only.  Pt. Is scheduled to D/C home today, however, if pt. Remains in hospital for any reason, would benefit from continued skilled PT to address transfer difficulty/LE weakness to improve safety with return home.   Recommendations for follow up therapy are one component of a multi-disciplinary discharge planning process, led by the attending physician.  Recommendations may be updated based on patient status, additional functional criteria and insurance authorization.  Follow Up Recommendations  SNF (Pt would benefit from SNF, but does not have insurance coverage.  Will be returning home with assist from mother.)     Equipment Recommendations  Rolling walker with 5" wheels;3in1 (PT);Wheelchair (measurements PT)    Recommendations for Other Services       Precautions / Restrictions Precautions Precautions: None     Mobility  Bed Mobility Overal bed mobility: Modified Independent;Needs Assistance Bed Mobility: Supine to Sit;Sit to Supine     Supine to sit: Min assist Sit to supine: Modified independent (Device/Increase time)   General bed mobility comments: Pt. is laying lower in bed today resulting in inc  difficulty with completing sup > sit with mod I.  Pt. initially refuses any help from PT, but after inc time and failed attempts to complete activity with mod I, pt. is agreeable to allow PT to assist from trunk and with LEs.  Pt. completes activity with min A.  Demos good sitting balance EOB. Patient Response: Cooperative  Transfers   Equipment used: Agricultural consultant (2 wheeled) Transfers: Sit to/from Stand Sit to Stand: Max assist;Min assist Stand pivot transfers: Min guard       General transfer comment: Pt. practice sit > stand x 3 with use of gait belt and mother's assistance. Mother is instructed on gait belt use, demos understanding.  On first attempt, sit > stand is unsuccessful with pt. demoing B knee buckling even with mother providing max A.  On 2nd and 3rd attempt, able to progress to sit > stand with min A with pt. using inc momentum to power up.  Ambulation/Gait Ambulation/Gait assistance: Min guard Gait Distance (Feet): 2 Feet Assistive device: Rolling walker (2 wheeled) Gait Pattern/deviations: Shuffle;Decreased step length - left;Decreased step length - right     General Gait Details: Pt. demos dec step length with shuffling gait/poor foot clearance.  Able to complete with CGA only to pivot over to chair.   Stairs             Wheelchair Mobility    Modified Rankin (Stroke Patients Only)       Balance           Standing balance support: Bilateral upper extremity supported;During functional activity Standing balance-Leahy Scale: Poor  Cognition Arousal/Alertness: Awake/alert Behavior During Therapy: Flat affect Overall Cognitive Status: Difficult to assess                                 General Comments: Mother is present, as well as NSG, to assist with training for return home.      Exercises      General Comments General comments (skin integrity, edema, etc.): Mom is contacted and PT  requests that she come into hospital to practice sit > stand with pt. to ensure safety with return home.      Pertinent Vitals/Pain Pain Assessment: 0-10 Pain Score: 6  Faces Pain Scale: Hurts little more Pain Location: L hip Pain Descriptors / Indicators: Aching;Discomfort Pain Intervention(s): Monitored during session    Home Living                      Prior Function            PT Goals (current goals can now be found in the care plan section) Acute Rehab PT Goals Patient Stated Goal: less pain Progress towards PT goals: Progressing toward goals    Frequency           PT Plan      Co-evaluation              AM-PAC PT "6 Clicks" Mobility   Outcome Measure  Help needed turning from your back to your side while in a flat bed without using bedrails?: A Little Help needed moving from lying on your back to sitting on the side of a flat bed without using bedrails?: A Little Help needed moving to and from a bed to a chair (including a wheelchair)?: A Little Help needed standing up from a chair using your arms (e.g., wheelchair or bedside chair)?: A Lot Help needed to walk in hospital room?: A Little Help needed climbing 3-5 steps with a railing? : Total 6 Click Score: 15    End of Session Equipment Utilized During Treatment: Gait belt Activity Tolerance: Patient tolerated treatment well Patient left: in chair;with nursing/sitter in room;with family/visitor present         Time: 1610-9604 PT Time Calculation (min) (ACUTE ONLY): 10 min  Charges:  $Gait Training: 8-22 mins $Therapeutic Activity: 38-52 mins $Self Care/Home Management: 8-22                     Raza Bayless A. Mylo Driskill, PT, DPT Acute Rehabilitation Services Office: [phone]    Alya Smaltz A Brysten Reister 10/19/2020, 1:22 PM

## 2020-10-19 NOTE — Discharge Summary (Addendum)
Name: Kyle Montgomery MRN: [id number] DOB: [birth year] 45 y.o. PCP: Orvis Brill, MD  Date of Admission: 10/13/2020  1:42 PM Date of Discharge:  10/19/2020 Attending Physician: Dr. Daryll Drown  DISCHARGE DIAGNOSIS:  Primary Problem: Septic arthritis [facility])   Hospital Problems: Principal Problem:   Septic arthritis [facility]) Active Problems:   Left hip pain    DISCHARGE MEDICATIONS:   Allergies as of 10/19/2020   No Known Allergies      Medication List     STOP taking these medications    diclofenac Sodium 1 % Gel Commonly known as: VOLTAREN   Rybelsus 7 MG Tabs Generic drug: Semaglutide       TAKE these medications    acetaminophen 325 MG tablet Commonly known as: Tylenol Take 2 tablets (650 mg total) by mouth every 6 (six) hours as needed.   amLODipine 10 MG tablet Commonly known as: NORVASC Take 1 tablet (10 mg total) by mouth daily.   amoxicillin-clavulanate 875-125 MG tablet Commonly known as: AUGMENTIN Take 1 tablet by mouth every 12 (twelve) hours.   atorvastatin 40 MG tablet Commonly known as: LIPITOR Take 1 tablet (40 mg total) by mouth daily.   divalproex 500 MG 24 hr tablet Commonly known as: DEPAKOTE ER Take 1 tablet (500 mg total) by mouth at bedtime.   doxycycline 100 MG tablet Commonly known as: VIBRA-TABS Take 1 tablet (100 mg total) by mouth every 12 (twelve) hours.   gabapentin 300 MG capsule Commonly known as: NEURONTIN Take 1 capsule (300 mg total) by mouth at bedtime.   lisinopril-hydrochlorothiazide 20-12.5 MG tablet Commonly known as: ZESTORETIC Take 2 tablets by mouth daily.   melatonin 3 MG Tabs tablet Take 1 tablet (3 mg total) by mouth at bedtime as needed.   oxyCODONE 5 MG immediate release tablet Commonly known as: Oxy IR/ROXICODONE Take 1 tablet (5 mg total) by mouth every 6 (six) hours as needed for up to 5 days for moderate pain or breakthrough pain.   polyethylene glycol 17 g packet Commonly known as: MIRALAX  / GLYCOLAX Take 17 g by mouth daily as needed for mild constipation.   ziprasidone 80 MG capsule Commonly known as: GEODON Take 1 capsule (80 mg total) by mouth 2 (two) times daily.               Durable Medical Equipment  (From admission, onward)           Start     Ordered   10/18/20 1300  For home use only DME standard manual wheelchair with seat cushion  Once       Comments: Patient suffers from Left hip pain, rule out septic arthritis which impairs their ability to perform daily activities like ambulating  in the home.  A cane will not resolve issue with performing activities of daily living. A wheelchair will allow patient to safely perform daily activities. Patient can safely propel the wheelchair in the home or has a caregiver who can provide assistance. Length of need life time . Accessories: elevating leg rests (ELRs), wheel locks, extensions and anti-tippers.   Seat and back cushions   Call Mother for delivery   10/18/20 1300   10/18/20 1259  For home use only DME 3 n 1  Once        10/18/20 1259   10/18/20 1259  For home use only DME Shower stool  Once        10/18/20 1259   10/18/20 1258  For home  use only DME Walker rolling  Once       Question Answer Comment  Walker: With [place]   Patient needs a walker to treat with the following condition Weakness      10/18/20 1259            DISPOSITION AND FOLLOW-UP:  Mr.Xsavier L Montgomery was discharged from [facility] in Stable condition. At the hospital follow up visit please address:  #Septic arthritis of Left SI joint Ensure completion of antibiotic course.  #AKI on CKD 3B Follow-up BMP to ensure continued resolution of AKI.   #Type 2 diabetes HbA1c found to be 5.9% during admission.  There are concerns regarding patient's home semaglutide due to cost, resulting in him not taking this.  Would recommend exploring other options for diabetic management.  Follow-up  Recommendations: Consults: None Labs: Basic Metabolic Profile Studies: None Medications: Continue Augmentin 875-125 mg p.o. twice daily, doxycycline 100 mg p.o. twice daily for septic arthritis.  Follow-up Appointments: The internal medicine clinic will reach out to you to schedule a hospital follow-up visit.  HOSPITAL COURSE:  Patient Summary: #Septic arthritis of Left SI joint Patient initially presented with several weeks of left hip pain that had worsened over the last several days requiring crutches to walk due to painful ambulation.  He was tachycardic with low-grade fever on presentation and had decreased range of motion to the left hip.  He has been noted to have sclerotic lesion in the pelvis previously with repeat CT pelvis showing sclerotic focus in the left iliac bone that has marginally increased in size over the last 2 years; adjacent sclerosis of the left sacroiliac joint with sacroiliac joint erosions; no fracture; bilateral fat-containing inguinal hernias.  Concerns for possible septic joint were also raised at this time.  He had elevated ESR of 45 and CRP of 11 on presentation.  Blood cultures were drawn and showed growth of staph saprophyticus.  At that time he was given empirical Rocephin and IV vancomycin.  Orthopedic surgery was consulted and agreed with the plan of antibiotic treatment and consulting IR for possible arthrocentesis; they did not feel that there is abscess or fluid to obtain culture data from.  Orthopedic surgery evaluated the patient on 9/25 and felt that the patient was responding well to empiric antibiotic treatment and felt that there was no concern for septic arthritis of the left hip joint based on physical exam and imaging findings.  Due to complaints of bilateral lower extremity neuropathy, Neurontin was started on 9/27.  Patient was transition to p.o. doxycycline and Augmentin on 9/28. Physical therapy evaluation recommended home health physical therapy, DME  including rolling walker with 5 inch wheels, 3 in 1.  #AKI on CKD 3B Baseline creatinine appears to be around 2.08 with GFR 43; on presentation patient had a creatinine of 2.38 with GFR of 33.  He was initiated on gentle hydration with LR at 50 cc/h which was discontinued on 9/25.  In this time he also had resolution of AKI with creatinine of 1.96 on day of discharge.  #Hypertension Patient remained normotensive on home amlodipine 10 mg daily.   #HLD Chronically managed.  He was continued on home atorvastatin 40 mg daily.   #Bipolar disorder Chronically managed.  He was continued on home Depakote and Geodon.   #Type 2 diabetes HbA1c found to be 5.9%.  He was managed with sliding scale insulin throughout this admission, requiring minimal units.  His home rybelsus was held  at discharge.  Repeat A1C as indicated in the outpatient setting.   DISCHARGE INSTRUCTIONS:   Discharge Instructions     Diet - low sodium heart healthy   Complete by: As directed    Increase activity slowly   Complete by: As directed        SUBJECTIVE:  Patient evaluated at bedside on day of discharge.  He reports that his pain has been well managed.  He does state that he has increased pain when ambulating with physical therapy, resulting in him only being able to take small steps each time.  I encouraged him to continue to work with physical therapy and that small steps are still important in increasing his strength.  Discharge Vitals:   BP 137/85 (BP Location: Left Arm)   Pulse 92   Temp 98.4 F (36.9 C) (Oral)   Resp 18   Ht 5' 10" (1.778 m)   Wt 96.2 kg   SpO2 97%   BMI 30.43 kg/m   OBJECTIVE:  Constitutional: Patient is lying in bed comfortably.  No acute distress noted. Cardio: Regular rate and rhythm.  No murmurs, rubs, gallops. Pulm: Clear to auscultation bilaterally.  Normal work of breathing on room air. Abdomen: Soft, nontender, nondistended. MSK: RLE strength 4/5, R foot strength 3/5.   LLE strength 3/5, L foot strength 4/5, likely secondary to pain.  Sensation equal bilaterally LE. Skin: Skin is warm and dry. Neuro: No focal deficit noted. Psych: Appropriate mood and affect.  Pertinent Labs, Studies, and Procedures:  CBC Latest Ref Rng & Units 10/19/2020 10/16/2020 10/15/2020  WBC 4.0 - 10.5 K/uL 9.1 9.6 9.6  Hemoglobin 13.0 - 17.0 g/dL 11.5(L) 11.2(L) 10.8(L)  Hematocrit 39.0 - 52.0 % 36.3(L) 35.4(L) 33.8(L)  Platelets 150 - 400 K/uL 304 236 229    CMP Latest Ref Rng & Units 10/19/2020 10/16/2020 10/15/2020  Glucose 70 - 99 mg/dL 117(H) 101(H) 128(H)  BUN 6 - 20 mg/dL 27(H) 24(H) 23(H)  Creatinine 0.61 - 1.24 mg/dL 1.96(H) 2.20(H) 2.09(H)  Sodium 135 - 145 mmol/L 141 140 142  Potassium 3.5 - 5.1 mmol/L 3.8 3.6 3.4(L)  Chloride 98 - 111 mmol/L 105 103 107  CO2 22 - 32 mmol/L _0 Calcium 8.9 - 10.3 mg/dL 9.7 9.1 9.1  Total Protein 6.5 - 8.1 g/dL 7.6 - -  Total Bilirubin 0.3 - 1.2 mg/dL 0.6 - -  Alkaline Phos 38 - 126 U/L 60 - -  AST 15 - 41 U/L 44(H) - -  ALT 0 - 44 U/L 58(H) - -    CT PELVIS WO CONTRAST  Result Date: 10/13/2020 CLINICAL DATA:  Pelvic fracture hx of bone lesion, has L hip pain EXAM: CT PELVIS WITHOUT CONTRAST TECHNIQUE: Multidetector CT imaging of the pelvis was performed following the standard protocol without intravenous contrast. COMPARISON:  Radiographs earlier today.  Pelvis CT 03/31/2016 FINDINGS: Urinary Tract: Distal ureters are decompressed. Unremarkable urinary bladder. Bowel: Moderate stool in the rectosigmoid colon. Normal appendix. No bowel inflammation. Vascular/Lymphatic: No pelvic adenopathy. Atherosclerosis of the iliac arteries. Reproductive:  Unremarkable prostate. Other:  Bilateral fat containing inguinal hernias. Musculoskeletal: Sclerotic focus in the left iliac bone measures 2.6 x 1.2 cm, previously 2.4 x 1.3 cm on 2018 exam. Margins are lobulated. There is some adjacent sclerosis of the left sacroiliac joint with sacroiliac  joint erosions. It is unclear if this lesion is related to the sacroiliac joint changes or there is separate sacroiliitis. No fracture is seen. No new or other  focal bone lesions. IMPRESSION: 1. Sclerotic focus in the left iliac bone has marginally increased in size over the past 2 years. There is some adjacent sclerosis of the left sacroiliac joint with sacroiliac joint erosions. It is unclear if this lesion is related to the sacroiliac joint changes or there is separate sacroiliitis. Recommend clinical correlation for signs or symptoms of inflammatory arthropathy. 2. No fracture. 3. Bilateral fat containing inguinal hernias. Aortic Atherosclerosis (ICD10-I70.0). Electronically Signed   By: Keith Rake M.D.   On: 10/13/2020 22:09   MR HIP LEFT WO CONTRAST  Result Date: 10/14/2020 CLINICAL DATA:  Septic arthritis suspected, hip, xray done. Left hip pain. Elevated serum inflammatory markers. EXAM: MR OF THE LEFT HIP WITHOUT CONTRAST TECHNIQUE: Multiplanar, multisequence MR imaging was performed. No intravenous contrast was administered. COMPARISON:  X-ray 10/13/2020, CT 10/13/2020 and 03/31/2016 FINDINGS: Bones: No acute fracture. No dislocation. No femoral head avascular necrosis. Very low T1 and T2 signal nonexpansile bone lesion within the left iliac bone posteriorly closely approximating the mid left sacroiliac joint. There is adjacent bone marrow edema within the ileum (series 3, images 6-9). Mild arthropathy of the left SI joint with subtle erosions. Trace amount of fluid within the left SI joint. There also small right-sided facet joint effusions at L4-5 and L5-S1 seen at the edge of the field-of-view with adjacent soft tissue edema. The L4-5 and L5-S1 intervertebral discs are normal in appearance. Articular cartilage and labrum Articular cartilage: No cartilage defect. No subchondral marrow signal changes. Labrum:  Intact.  No paralabral cyst. Joint or bursal effusion Joint effusion:  None. Bursae:  No abnormal bursal fluid collection. Muscles and tendons Muscles and tendons: The gluteal, hamstring, iliopsoas, rectus femoris, and adductor tendons appear intact without tear or significant tendinosis. Intramuscular edema within the lower posterior paraspinal musculature more pronounced on the right. Otherwise unremarkable muscle bulk and signal intensity within the pelvis. Other findings Miscellaneous: No inguinal lymphadenopathy. No acute findings are seen within the pelvis. IMPRESSION: 1. Degenerative changes of the left SI joint with subtle erosive changes and a trace effusion. Findings may be related to sacroiliitis, which could be inflammatory or infectious. 2. Redemonstrated probable bone island within the posterior left ilium closely approximating the left SI joint. There is marrow edema within the adjacent ileum. It is unclear if this is reactive secondary to adjacent SI joint arthropathy or related to the bone lesion. 3. No acute osseous abnormality of the left hip. Specifically, no evidence of septic arthritis of the left hip. 4. Small nonspecific right-sided facet joint effusions at L4-5 and L5-S1 seen at the edge of the field-of-view with adjacent soft tissue edema. Findings could be degenerative or potentially reflect septic arthritis given the patient's history. Electronically Signed   By: Davina Poke D.O.   On: 10/14/2020 16:42   DG Hip Unilat W or Wo Pelvis 2-3 Views Left  Result Date: 10/13/2020 CLINICAL DATA:  Severe left hip pain for 3 days, no known injury EXAM: DG HIP (WITH OR WITHOUT PELVIS) 2-3V LEFT COMPARISON:  CT pelvis, 03/31/2016 FINDINGS: There is no evidence of hip fracture or dislocation. There is no evidence of arthropathy. Unchanged sclerotic lesion of the left ilium, previously characterized as a benign bone island. IMPRESSION: No fracture or dislocation of the left hip. Joint spaces are preserved. Electronically Signed   By: Eddie Candle M.D.   On: 10/13/2020 14:53      Signed: Farrel Gordon, D.O.  Internal Medicine Resident, PGY-1 [facility]  Pager: [phone] 11:39 AM, 10/19/2020

## 2020-10-19 NOTE — TOC Progression Note (Addendum)
Transition of Care [facility]) - Progression Note    Patient Details  Name: Kyle Montgomery MRN: [id number] Date of Birth: 23-Sep-1975  [facility]) CM/SW Contact  Nadene Rubins Adria Devon, RN Phone Number: 10/19/2020, 11:02 AM  Clinical Narrative:     Patient has no SNF bed offers.   Discussed with patient and his mother Kyle Montgomery [phone] via speaker phone. Patient prefers to go home anyway. Mother supports this decision.   They are requesting ambulance transport home. NCM confirmed address.  [facility] with [facility] can accept referral for HHPT . Patient and mother understand HHPT may only visit once or twice a week and medicaid approves very few visits.   Patient will need wheel chair, 3 in 1 shower /tub stool, and walker. [facility] does not transport DME. PAtient and mother aware. Mother is contact person for delivery. DME ordered with Velna Hatchet with [employer]. Velna Hatchet aware patient discharging today by [facility] Mons, mother's contact information given to [place].   NCM messaged MD and nurse to see when to arrange [facility] called for 1 pm.  Expected Discharge Plan: Home w Home Health Services Barriers to Discharge: Continued Medical Work up, English as a second language teacher  Expected Discharge Plan and Services Expected Discharge Plan: Home w Home Health Services     Post Acute Care Choice: Home Health Living arrangements for the past 2 months: Single Family Home                           HH Arranged: RN, PT           Social Determinants of Health (SDOH) Interventions    Readmission Risk Interventions No flowsheet data found.

## 2020-10-23 ENCOUNTER — Inpatient Hospital Stay (HOSPITAL_COMMUNITY)
Admission: EM | Admit: 2020-10-23 | Discharge: 2020-11-01 | DRG: 394 | Disposition: A | Discharge status: Home Health | Payer: Medicaid Other | Attending: Internal Medicine | Admitting: Internal Medicine

## 2020-10-23 ENCOUNTER — Emergency Department (HOSPITAL_COMMUNITY): Payer: Medicaid Other

## 2020-10-23 ENCOUNTER — Other Ambulatory Visit: Payer: Self-pay

## 2020-10-23 ENCOUNTER — Encounter (HOSPITAL_COMMUNITY): Payer: Self-pay

## 2020-10-23 DIAGNOSIS — N1831 Chronic kidney disease, stage 3a: Secondary | ICD-10-CM | POA: Diagnosis not present

## 2020-10-23 DIAGNOSIS — K8020 Calculus of gallbladder without cholecystitis without obstruction: Secondary | ICD-10-CM | POA: Diagnosis present

## 2020-10-23 DIAGNOSIS — E785 Hyperlipidemia, unspecified: Secondary | ICD-10-CM | POA: Diagnosis present

## 2020-10-23 DIAGNOSIS — E11649 Type 2 diabetes mellitus with hypoglycemia without coma: Secondary | ICD-10-CM | POA: Diagnosis not present

## 2020-10-23 DIAGNOSIS — Z20822 Contact with and (suspected) exposure to covid-19: Secondary | ICD-10-CM | POA: Diagnosis present

## 2020-10-23 DIAGNOSIS — Z79899 Other long term (current) drug therapy: Secondary | ICD-10-CM | POA: Diagnosis not present

## 2020-10-23 DIAGNOSIS — F3170 Bipolar disorder, currently in remission, most recent episode unspecified: Secondary | ICD-10-CM | POA: Diagnosis not present

## 2020-10-23 DIAGNOSIS — D649 Anemia, unspecified: Secondary | ICD-10-CM | POA: Diagnosis present

## 2020-10-23 DIAGNOSIS — Z83438 Family history of other disorder of lipoprotein metabolism and other lipidemia: Secondary | ICD-10-CM | POA: Diagnosis not present

## 2020-10-23 DIAGNOSIS — E1141 Type 2 diabetes mellitus with diabetic mononeuropathy: Secondary | ICD-10-CM | POA: Diagnosis present

## 2020-10-23 DIAGNOSIS — K567 Ileus, unspecified: Secondary | ICD-10-CM

## 2020-10-23 DIAGNOSIS — R6510 Systemic inflammatory response syndrome (SIRS) of non-infectious origin without acute organ dysfunction: Secondary | ICD-10-CM | POA: Diagnosis present

## 2020-10-23 DIAGNOSIS — R112 Nausea with vomiting, unspecified: Secondary | ICD-10-CM

## 2020-10-23 DIAGNOSIS — A419 Sepsis, unspecified organism: Secondary | ICD-10-CM | POA: Diagnosis not present

## 2020-10-23 DIAGNOSIS — R0902 Hypoxemia: Secondary | ICD-10-CM | POA: Diagnosis not present

## 2020-10-23 DIAGNOSIS — E663 Overweight: Secondary | ICD-10-CM | POA: Diagnosis present

## 2020-10-23 DIAGNOSIS — Z5329 Procedure and treatment not carried out because of patient's decision for other reasons: Secondary | ICD-10-CM | POA: Diagnosis present

## 2020-10-23 DIAGNOSIS — N1832 Chronic kidney disease, stage 3b: Secondary | ICD-10-CM | POA: Diagnosis present

## 2020-10-23 DIAGNOSIS — E876 Hypokalemia: Secondary | ICD-10-CM | POA: Diagnosis present

## 2020-10-23 DIAGNOSIS — K5931 Toxic megacolon: Secondary | ICD-10-CM | POA: Diagnosis present

## 2020-10-23 DIAGNOSIS — E870 Hyperosmolality and hypernatremia: Secondary | ICD-10-CM | POA: Diagnosis not present

## 2020-10-23 DIAGNOSIS — E860 Dehydration: Secondary | ICD-10-CM | POA: Diagnosis present

## 2020-10-23 DIAGNOSIS — K5939 Other megacolon: Secondary | ICD-10-CM | POA: Diagnosis present

## 2020-10-23 DIAGNOSIS — K635 Polyp of colon: Secondary | ICD-10-CM | POA: Diagnosis present

## 2020-10-23 DIAGNOSIS — K560 Paralytic ileus: Secondary | ICD-10-CM | POA: Diagnosis present

## 2020-10-23 DIAGNOSIS — I10 Essential (primary) hypertension: Secondary | ICD-10-CM | POA: Diagnosis present

## 2020-10-23 DIAGNOSIS — M009 Pyogenic arthritis, unspecified: Secondary | ICD-10-CM | POA: Diagnosis present

## 2020-10-23 DIAGNOSIS — I129 Hypertensive chronic kidney disease with stage 1 through stage 4 chronic kidney disease, or unspecified chronic kidney disease: Secondary | ICD-10-CM | POA: Diagnosis present

## 2020-10-23 DIAGNOSIS — F319 Bipolar disorder, unspecified: Secondary | ICD-10-CM | POA: Diagnosis present

## 2020-10-23 DIAGNOSIS — Z7401 Bed confinement status: Secondary | ICD-10-CM

## 2020-10-23 DIAGNOSIS — Z8249 Family history of ischemic heart disease and other diseases of the circulatory system: Secondary | ICD-10-CM | POA: Diagnosis not present

## 2020-10-23 DIAGNOSIS — Z87891 Personal history of nicotine dependence: Secondary | ICD-10-CM | POA: Diagnosis not present

## 2020-10-23 DIAGNOSIS — N183 Chronic kidney disease, stage 3 unspecified: Secondary | ICD-10-CM | POA: Diagnosis present

## 2020-10-23 DIAGNOSIS — E1122 Type 2 diabetes mellitus with diabetic chronic kidney disease: Secondary | ICD-10-CM | POA: Diagnosis present

## 2020-10-23 DIAGNOSIS — R1110 Vomiting, unspecified: Secondary | ICD-10-CM | POA: Diagnosis present

## 2020-10-23 DIAGNOSIS — Z6828 Body mass index (BMI) 28.0-28.9, adult: Secondary | ICD-10-CM

## 2020-10-23 DIAGNOSIS — Z79890 Hormone replacement therapy (postmenopausal): Secondary | ICD-10-CM | POA: Diagnosis not present

## 2020-10-23 DIAGNOSIS — Z9889 Other specified postprocedural states: Secondary | ICD-10-CM

## 2020-10-23 LAB — CBC WITH DIFFERENTIAL/PLATELET
Abs Immature Granulocytes: 0.11 10*3/uL — ABNORMAL HIGH (ref 0.00–0.07)
Basophils Absolute: 0.1 10*3/uL (ref 0.0–0.1)
Basophils Relative: 0 %
Eosinophils Absolute: 0 10*3/uL (ref 0.0–0.5)
Eosinophils Relative: 0 %
HCT: 40.4 % (ref 39.0–52.0)
Hemoglobin: 12.8 g/dL — ABNORMAL LOW (ref 13.0–17.0)
Immature Granulocytes: 1 %
Lymphocytes Relative: 7 %
Lymphs Abs: 1 10*3/uL (ref 0.7–4.0)
MCH: 27.5 pg (ref 26.0–34.0)
MCHC: 31.7 g/dL (ref 30.0–36.0)
MCV: 86.9 fL (ref 80.0–100.0)
Monocytes Absolute: 0.7 10*3/uL (ref 0.1–1.0)
Monocytes Relative: 5 %
Neutro Abs: 11.5 10*3/uL — ABNORMAL HIGH (ref 1.7–7.7)
Neutrophils Relative %: 87 %
Platelets: 461 10*3/uL — ABNORMAL HIGH (ref 150–400)
RBC: 4.65 MIL/uL (ref 4.22–5.81)
RDW: 13.6 % (ref 11.5–15.5)
WBC: 13.3 10*3/uL — ABNORMAL HIGH (ref 4.0–10.5)
nRBC: 0 % (ref 0.0–0.2)

## 2020-10-23 LAB — CBC
HCT: 37.5 % — ABNORMAL LOW (ref 39.0–52.0)
Hemoglobin: 11.4 g/dL — ABNORMAL LOW (ref 13.0–17.0)
MCH: 27.6 pg (ref 26.0–34.0)
MCHC: 30.4 g/dL (ref 30.0–36.0)
MCV: 90.8 fL (ref 80.0–100.0)
Platelets: 390 10*3/uL (ref 150–400)
RBC: 4.13 MIL/uL — ABNORMAL LOW (ref 4.22–5.81)
RDW: 13.5 % (ref 11.5–15.5)
WBC: 13.3 10*3/uL — ABNORMAL HIGH (ref 4.0–10.5)
nRBC: 0 % (ref 0.0–0.2)

## 2020-10-23 LAB — URINALYSIS, ROUTINE W REFLEX MICROSCOPIC
Bilirubin Urine: NEGATIVE
Glucose, UA: NEGATIVE mg/dL
Ketones, ur: NEGATIVE mg/dL
Leukocytes,Ua: NEGATIVE
Nitrite: NEGATIVE
Protein, ur: 100 mg/dL — AB
Specific Gravity, Urine: 1.012 (ref 1.005–1.030)
pH: 5 (ref 5.0–8.0)

## 2020-10-23 LAB — GLUCOSE, CAPILLARY
Glucose-Capillary: 113 mg/dL — ABNORMAL HIGH (ref 70–99)
Glucose-Capillary: 120 mg/dL — ABNORMAL HIGH (ref 70–99)

## 2020-10-23 LAB — COMPREHENSIVE METABOLIC PANEL
ALT: 56 U/L — ABNORMAL HIGH (ref 0–44)
AST: 39 U/L (ref 15–41)
Albumin: 3.2 g/dL — ABNORMAL LOW (ref 3.5–5.0)
Alkaline Phosphatase: 57 U/L (ref 38–126)
Anion gap: 13 (ref 5–15)
BUN: 44 mg/dL — ABNORMAL HIGH (ref 6–20)
CO2: 25 mmol/L (ref 22–32)
Calcium: 10 mg/dL (ref 8.9–10.3)
Chloride: 104 mmol/L (ref 98–111)
Creatinine, Ser: 2.13 mg/dL — ABNORMAL HIGH (ref 0.61–1.24)
GFR, Estimated: 38 mL/min — ABNORMAL LOW (ref >60)
Glucose, Bld: 148 mg/dL — ABNORMAL HIGH (ref 70–99)
Potassium: 3.1 mmol/L — ABNORMAL LOW (ref 3.5–5.1)
Sodium: 142 mmol/L (ref 135–145)
Total Bilirubin: 0.6 mg/dL (ref 0.3–1.2)
Total Protein: 8.3 g/dL — ABNORMAL HIGH (ref 6.5–8.1)

## 2020-10-23 LAB — RESP PANEL BY RT-PCR (FLU A&B, COVID) ARPGX2
Influenza A by PCR: NEGATIVE
Influenza B by PCR: NEGATIVE
SARS Coronavirus 2 by RT PCR: NEGATIVE

## 2020-10-23 LAB — LACTIC ACID, PLASMA
Lactic Acid, Venous: 1.4 mmol/L (ref 0.5–1.9)
Lactic Acid, Venous: 1.2 mmol/L (ref 0.5–1.9)

## 2020-10-23 LAB — D-DIMER, QUANTITATIVE: D-Dimer, Quant: 3.15 ug/mL-FEU — ABNORMAL HIGH (ref 0.00–0.50)

## 2020-10-23 LAB — LIPASE, BLOOD: Lipase: 66 U/L — ABNORMAL HIGH (ref 11–51)

## 2020-10-23 LAB — CREATININE, SERUM
Creatinine, Ser: 1.99 mg/dL — ABNORMAL HIGH (ref 0.61–1.24)
GFR, Estimated: 41 mL/min — ABNORMAL LOW (ref >60)

## 2020-10-23 LAB — MAGNESIUM: Magnesium: 2.1 mg/dL (ref 1.7–2.4)

## 2020-10-23 LAB — VALPROIC ACID LEVEL: Valproic Acid Lvl: 27 ug/mL — ABNORMAL LOW (ref 50.0–100.0)

## 2020-10-23 MED ORDER — POTASSIUM CHLORIDE 10 MEQ/100ML IV SOLN
10.0000 meq | Freq: Once | INTRAVENOUS | Status: AC
  Administered 2020-10-23: 10 meq via INTRAVENOUS
  Filled 2020-10-23: qty 100

## 2020-10-23 MED ORDER — ONDANSETRON HCL 4 MG PO TABS: 4.0000 mg | ORAL_TABLET | Freq: Four times a day (QID) | ORAL | Status: DC | PRN

## 2020-10-23 MED ORDER — DIVALPROEX SODIUM ER 500 MG PO TB24
500.0000 mg | ORAL_TABLET | Freq: Every day | ORAL | Status: DC
  Administered 2020-10-23 – 2020-10-31 (×9): 500 mg via ORAL
  Filled 2020-10-23 (×9): qty 1

## 2020-10-23 MED ORDER — ALBUTEROL SULFATE (2.5 MG/3ML) 0.083% IN NEBU: 2.5000 mg | INHALATION_SOLUTION | RESPIRATORY_TRACT | Status: DC | PRN

## 2020-10-23 MED ORDER — ZIPRASIDONE HCL 80 MG PO CAPS
80.0000 mg | ORAL_CAPSULE | Freq: Two times a day (BID) | ORAL | Status: DC
  Administered 2020-10-23 – 2020-11-01 (×19): 80 mg via ORAL
  Filled 2020-10-23: qty 4
  Filled 2020-10-23 (×10): qty 1
  Filled 2020-10-23: qty 4
  Filled 2020-10-23 (×7): qty 1

## 2020-10-23 MED ORDER — ONDANSETRON HCL 4 MG/2ML IJ SOLN
4.0000 mg | Freq: Four times a day (QID) | INTRAMUSCULAR | Status: DC | PRN
  Administered 2020-10-24: 4 mg via INTRAVENOUS
  Filled 2020-10-23: qty 2

## 2020-10-23 MED ORDER — MELATONIN 3 MG PO TABS
3.0000 mg | ORAL_TABLET | Freq: Every evening | ORAL | Status: DC | PRN
  Administered 2020-10-30 – 2020-10-31 (×2): 3 mg via ORAL
  Filled 2020-10-23 (×2): qty 1

## 2020-10-23 MED ORDER — ALBUTEROL SULFATE (2.5 MG/3ML) 0.083% IN NEBU
2.5000 mg | INHALATION_SOLUTION | Freq: Three times a day (TID) | RESPIRATORY_TRACT | Status: DC
  Administered 2020-10-23: 2.5 mg via RESPIRATORY_TRACT
  Filled 2020-10-23: qty 3

## 2020-10-23 MED ORDER — VANCOMYCIN HCL 2000 MG/400ML IV SOLN
2000.0000 mg | Freq: Once | INTRAVENOUS | Status: AC
  Administered 2020-10-23: 2000 mg via INTRAVENOUS
  Filled 2020-10-23: qty 400

## 2020-10-23 MED ORDER — ONDANSETRON HCL 4 MG/2ML IJ SOLN
4.0000 mg | Freq: Once | INTRAMUSCULAR | Status: AC
  Administered 2020-10-23: 4 mg via INTRAVENOUS
  Filled 2020-10-23: qty 2

## 2020-10-23 MED ORDER — VANCOMYCIN HCL IN DEXTROSE 1-5 GM/200ML-% IV SOLN: 1000.0000 mg | INTRAVENOUS | Status: DC

## 2020-10-23 MED ORDER — LISINOPRIL-HYDROCHLOROTHIAZIDE 20-12.5 MG PO TABS: 2.0000 | ORAL_TABLET | Freq: Every day | ORAL | Status: DC

## 2020-10-23 MED ORDER — LISINOPRIL 20 MG PO TABS
40.0000 mg | ORAL_TABLET | Freq: Every day | ORAL | Status: DC
  Administered 2020-10-23 – 2020-11-01 (×10): 40 mg via ORAL
  Filled 2020-10-23 (×11): qty 2

## 2020-10-23 MED ORDER — SODIUM CHLORIDE 0.9 % IV SOLN: INTRAVENOUS | Status: DC

## 2020-10-23 MED ORDER — GABAPENTIN 300 MG PO CAPS
300.0000 mg | ORAL_CAPSULE | Freq: Every day | ORAL | Status: DC
  Administered 2020-10-23 – 2020-10-31 (×9): 300 mg via ORAL
  Filled 2020-10-23 (×9): qty 1

## 2020-10-23 MED ORDER — SIMETHICONE 80 MG PO CHEW
80.0000 mg | CHEWABLE_TABLET | Freq: Four times a day (QID) | ORAL | Status: DC | PRN
  Filled 2020-10-23: qty 1

## 2020-10-23 MED ORDER — MORPHINE SULFATE (PF) 2 MG/ML IV SOLN: 2.0000 mg | INTRAVENOUS | Status: DC | PRN

## 2020-10-23 MED ORDER — SODIUM CHLORIDE 0.9 % IV BOLUS
1000.0000 mL | Freq: Once | INTRAVENOUS | Status: AC
  Administered 2020-10-23: 1000 mL via INTRAVENOUS

## 2020-10-23 MED ORDER — ACETAMINOPHEN 325 MG PO TABS
650.0000 mg | ORAL_TABLET | Freq: Four times a day (QID) | ORAL | Status: DC | PRN
  Administered 2020-11-01: 650 mg via ORAL
  Filled 2020-10-23: qty 2

## 2020-10-23 MED ORDER — ENOXAPARIN SODIUM 40 MG/0.4ML IJ SOSY
40.0000 mg | PREFILLED_SYRINGE | INTRAMUSCULAR | Status: DC
  Administered 2020-10-23 – 2020-10-31 (×7): 40 mg via SUBCUTANEOUS
  Filled 2020-10-23 (×8): qty 0.4

## 2020-10-23 MED ORDER — INSULIN ASPART 100 UNIT/ML IJ SOLN
0.0000 [IU] | Freq: Three times a day (TID) | INTRAMUSCULAR | Status: DC
  Administered 2020-10-24 – 2020-10-25 (×2): 1 [IU] via SUBCUTANEOUS
  Administered 2020-10-26: 2 [IU] via SUBCUTANEOUS
  Administered 2020-10-28 – 2020-10-29 (×2): 1 [IU] via SUBCUTANEOUS
  Administered 2020-10-30: 2 [IU] via SUBCUTANEOUS
  Administered 2020-10-31: 1 [IU] via SUBCUTANEOUS
  Filled 2020-10-23: qty 0.09

## 2020-10-23 MED ORDER — ALBUTEROL SULFATE (2.5 MG/3ML) 0.083% IN NEBU
2.5000 mg | INHALATION_SOLUTION | Freq: Four times a day (QID) | RESPIRATORY_TRACT | Status: DC
  Administered 2020-10-23: 2.5 mg via RESPIRATORY_TRACT
  Filled 2020-10-23: qty 3

## 2020-10-23 MED ORDER — FENTANYL CITRATE PF 50 MCG/ML IJ SOSY
50.0000 ug | PREFILLED_SYRINGE | Freq: Once | INTRAMUSCULAR | Status: AC
Start: 2020-10-23 — End: 2020-10-23
  Administered 2020-10-23: 50 ug via INTRAVENOUS
  Filled 2020-10-23: qty 1

## 2020-10-23 MED ORDER — ACETAMINOPHEN 650 MG RE SUPP: 650.0000 mg | Freq: Four times a day (QID) | RECTAL | Status: DC | PRN

## 2020-10-23 MED ORDER — SODIUM CHLORIDE 0.9 % IV SOLN
3.0000 g | Freq: Once | INTRAVENOUS | Status: AC
  Administered 2020-10-23: 3 g via INTRAVENOUS
  Filled 2020-10-23: qty 8

## 2020-10-23 MED ORDER — ALBUTEROL SULFATE (2.5 MG/3ML) 0.083% IN NEBU
2.5000 mg | INHALATION_SOLUTION | Freq: Two times a day (BID) | RESPIRATORY_TRACT | Status: DC
  Administered 2020-10-24 – 2020-10-26 (×6): 2.5 mg via RESPIRATORY_TRACT
  Filled 2020-10-23 (×6): qty 3

## 2020-10-23 MED ORDER — POTASSIUM CHLORIDE 10 MEQ/100ML IV SOLN
10.0000 meq | INTRAVENOUS | Status: AC
  Administered 2020-10-23 (×4): 10 meq via INTRAVENOUS
  Filled 2020-10-23 (×4): qty 100

## 2020-10-23 MED ORDER — ATORVASTATIN CALCIUM 40 MG PO TABS
40.0000 mg | ORAL_TABLET | Freq: Every day | ORAL | Status: DC
  Administered 2020-10-23 – 2020-10-24 (×2): 40 mg via ORAL
  Filled 2020-10-23 (×2): qty 1

## 2020-10-23 MED ORDER — SODIUM CHLORIDE 0.9 % IV SOLN
3.0000 g | Freq: Four times a day (QID) | INTRAVENOUS | Status: AC
  Administered 2020-10-23 – 2020-11-01 (×34): 3 g via INTRAVENOUS
  Filled 2020-10-23 (×37): qty 8

## 2020-10-23 MED ORDER — AMLODIPINE BESYLATE 10 MG PO TABS
10.0000 mg | ORAL_TABLET | Freq: Every day | ORAL | Status: DC
  Administered 2020-10-23 – 2020-11-01 (×10): 10 mg via ORAL
  Filled 2020-10-23 (×7): qty 1
  Filled 2020-10-23: qty 2
  Filled 2020-10-23 (×2): qty 1

## 2020-10-23 MED ORDER — METOPROLOL TARTRATE 5 MG/5ML IV SOLN: 5.0000 mg | Freq: Four times a day (QID) | INTRAVENOUS | Status: DC | PRN

## 2020-10-23 MED ORDER — HYDROCHLOROTHIAZIDE 25 MG PO TABS
25.0000 mg | ORAL_TABLET | Freq: Every day | ORAL | Status: DC
  Administered 2020-10-23 – 2020-10-24 (×2): 25 mg via ORAL
  Filled 2020-10-23 (×2): qty 1

## 2020-10-23 MED ORDER — OXYCODONE HCL 5 MG PO TABS
5.0000 mg | ORAL_TABLET | ORAL | Status: DC | PRN
  Administered 2020-10-23: 5 mg via ORAL
  Filled 2020-10-23: qty 1

## 2020-10-23 NOTE — ED Notes (Signed)
ED TO INPATIENT HANDOFF REPORT  Name/Age/Gender Kyle Montgomery 45 y.o. male  Code Status    Code Status Orders  (From admission, onward)           Start     Ordered   10/23/20 1149  Full code  Continuous        10/23/20 1148           Code Status History     Date Active Date Inactive Code Status Order ID Comments User Context   10/13/2020 2331 10/19/2020 1941 Full Code [id number]  Darlin Drop, DO ED       Home/SNF/Other Home  Chief Complaint SIRS (systemic inflammatory response syndrome) (HCC) [R65.10]  Level of Care/Admitting Diagnosis ED Disposition     ED Disposition  Admit   Condition  --   Comment  Hospital Area: [facility] [[id number]]  Level of Care: Telemetry [5]  Admit to tele based on following criteria: Monitor for Ischemic changes  May admit patient to [facility] or [facility] if equivalent level of care is available:: No  Covid Evaluation: Confirmed COVID Negative  Diagnosis: SIRS (systemic inflammatory response syndrome) [facility]) [[id number]]  Admitting Physician: Teddy Spike [[id number]]  Attending Physician: Teddy Spike [[id number]]  Estimated length of stay: past midnight tomorrow  Certification:: I certify this patient will need inpatient services for at least 2 midnights          Medical History Past Medical History:  Diagnosis Date   Bipolar affective (HCC)    followed by mental health   Gynecomastia 02/07   normal pituitary by MRI   Hyperprolactinemia (HCC) 09/27/04   felt 2/2 lithium and fluphenazine   Hypertension    Renal insufficiency    baseline Cr 2.2 - renal US with increased Echo signal, no nephrology eval, no bx    Allergies No Known Allergies  IV Location/Drains/Wounds Patient Lines/Drains/Airways Status     Active Line/Drains/Airways     Name Placement date Placement time Site Days   Peripheral IV 10/23/20 22 G Posterior;Right Hand 10/23/20  0639  Hand  less than 1   Peripheral IV 10/23/20  20 G Distal;Left;Posterior Forearm 10/23/20  0815  Forearm  less than 1            Labs/Imaging Results for orders placed or performed during the hospital encounter of 10/23/20 (from the past 48 hour(s))  CBC with Differential     Status: Abnormal   Collection Time: 10/23/20  6:33 AM  Result Value Ref Range   WBC 13.3 (H) 4.0 - 10.5 K/uL   RBC 4.65 4.22 - 5.81 MIL/uL   Hemoglobin 12.8 (L) 13.0 - 17.0 g/dL   HCT 38.9 37.3 - 42.8 %   MCV 86.9 80.0 - 100.0 fL   MCH 27.5 26.0 - 34.0 pg   MCHC 31.7 30.0 - 36.0 g/dL   RDW 76.8 11.5 - 72.6 %   Platelets 461 (H) 150 - 400 K/uL   nRBC 0.0 0.0 - 0.2 %   Neutrophils Relative % 87 %   Neutro Abs 11.5 (H) 1.7 - 7.7 K/uL   Lymphocytes Relative 7 %   Lymphs Abs 1.0 0.7 - 4.0 K/uL   Monocytes Relative 5 %   Monocytes Absolute 0.7 0.1 - 1.0 K/uL   Eosinophils Relative 0 %   Eosinophils Absolute 0.0 0.0 - 0.5 K/uL   Basophils Relative 0 %   Basophils Absolute 0.1 0.0 - 0.1 K/uL   Immature Granulocytes  1 %   Abs Immature Granulocytes 0.11 (H) 0.00 - 0.07 K/uL    Comment: Performed at [facility], [address] [place], [place] [address]  Urinalysis, Routine w reflex microscopic Urine, Clean Catch     Status: Abnormal   Collection Time: 10/23/20  6:49 AM  Result Value Ref Range   Color, Urine YELLOW YELLOW   APPearance CLEAR CLEAR   Specific Gravity, Urine 1.012 1.005 - 1.030   pH 5.0 5.0 - 8.0   Glucose, UA NEGATIVE NEGATIVE mg/dL   Hgb urine dipstick SMALL (A) NEGATIVE   Bilirubin Urine NEGATIVE NEGATIVE   Ketones, ur NEGATIVE NEGATIVE mg/dL   Protein, ur 665 (A) NEGATIVE mg/dL   Nitrite NEGATIVE NEGATIVE   Leukocytes,Ua NEGATIVE NEGATIVE   RBC / HPF 0-5 0 - 5 RBC/hpf   WBC, UA 0-5 0 - 5 WBC/hpf   Bacteria, UA RARE (A) NONE SEEN   Squamous Epithelial / LPF 0-5 0 - 5   Mucus PRESENT     Comment: Performed at [facility], [address] [place], [place] [address]  Comprehensive metabolic  panel     Status: Abnormal   Collection Time: 10/23/20  7:30 AM  Result Value Ref Range   Sodium 142 135 - 145 mmol/L   Potassium 3.1 (L) 3.5 - 5.1 mmol/L   Chloride 104 98 - 111 mmol/L   CO2 25 22 - 32 mmol/L   Glucose, Bld 148 (H) 70 - 99 mg/dL    Comment: Glucose reference range applies only to samples taken after fasting for at least 8 hours.   BUN 44 (H) 6 - 20 mg/dL   Creatinine, Ser 0.17 (H) 0.61 - 1.24 mg/dL   Calcium 79.3 8.9 - 90.3 mg/dL   Total Protein 8.3 (H) 6.5 - 8.1 g/dL   Albumin 3.2 (L) 3.5 - 5.0 g/dL   AST 39 15 - 41 U/L   ALT 56 (H) 0 - 44 U/L   Alkaline Phosphatase 57 38 - 126 U/L   Total Bilirubin 0.6 0.3 - 1.2 mg/dL   GFR, Estimated 38 (L) >60 mL/min    Comment: (NOTE) Calculated using the CKD-EPI Creatinine Equation (2021)    Anion gap 13 5 - 15    Comment: Performed at [facility], [address], [place], [place] [address]  Lipase, blood     Status: Abnormal   Collection Time: 10/23/20  7:30 AM  Result Value Ref Range   Lipase 66 (H) 11 - 51 U/L    Comment: Performed at [facility], [address] [place], [place] [address]  Lactic acid, plasma     Status: None   Collection Time: 10/23/20  8:25 AM  Result Value Ref Range   Lactic Acid, Venous 1.4 0.5 - 1.9 mmol/L    Comment: Performed at [facility], [address] [place], [place] [address]  Resp Panel by RT-PCR (Flu A&B, Covid) Nasopharyngeal Swab     Status: None   Collection Time: 10/23/20  8:25 AM   Specimen: Nasopharyngeal Swab; Nasopharyngeal(NP) swabs in vial transport medium  Result Value Ref Range   SARS Coronavirus 2 by RT PCR NEGATIVE NEGATIVE    Comment: (NOTE) SARS-CoV-2 target nucleic acids are NOT DETECTED.  The SARS-CoV-2 RNA is generally detectable in upper respiratory specimens during the acute phase of infection. The lowest concentration of SARS-CoV-2 viral copies this assay can detect is 138 copies/mL. A negative  result does not  preclude SARS-Cov-2 infection and should not be used as the sole basis for treatment or other patient management decisions. A negative result may occur with  improper specimen collection/handling, submission of specimen other than nasopharyngeal swab, presence of viral mutation(s) within the areas targeted by this assay, and inadequate number of viral copies(<138 copies/mL). A negative result must be combined with clinical observations, patient history, and epidemiological information. The expected result is Negative.  Fact Sheet for Patients:  BloggerCourse.com  Fact Sheet for Healthcare Providers:  SeriousBroker.it  This test is no t yet approved or cleared by the [place] FDA and  has been authorized for detection and/or diagnosis of SARS-CoV-2 by FDA under an Emergency Use Authorization (EUA). This EUA will remain  in effect (meaning this test can be used) for the duration of the COVID-19 declaration under Section 564(b)(1) of the Act, 21 U.S.C.section 360bbb-3(b)(1), unless the authorization is terminated  or revoked sooner.       Influenza A by PCR NEGATIVE NEGATIVE   Influenza B by PCR NEGATIVE NEGATIVE    Comment: (NOTE) The Xpert Xpress SARS-CoV-2/FLU/RSV plus assay is intended as an aid in the diagnosis of influenza from Nasopharyngeal swab specimens and should not be used as a sole basis for treatment. Nasal washings and aspirates are unacceptable for Xpert Xpress SARS-CoV-2/FLU/RSV testing.  Fact Sheet for Patients: BloggerCourse.com  Fact Sheet for Healthcare Providers: SeriousBroker.it  This test is not yet approved or cleared by the [place] FDA and has been authorized for detection and/or diagnosis of SARS-CoV-2 by FDA under an Emergency Use Authorization (EUA). This EUA will remain in effect (meaning this test can be used) for the  duration of the COVID-19 declaration under Section 564(b)(1) of the Act, 21 U.S.C. section 360bbb-3(b)(1), unless the authorization is terminated or revoked.  Performed at [facility], [address] [place], [place] [address]   Valproic acid level     Status: Abnormal   Collection Time: 10/23/20  8:25 AM  Result Value Ref Range   Valproic Acid Lvl 27 (L) 50.0 - 100.0 ug/mL    Comment: Performed at [facility], [address], [place], [place] [address]  Magnesium     Status: None   Collection Time: 10/23/20  8:25 AM  Result Value Ref Range   Magnesium 2.1 1.7 - 2.4 mg/dL    Comment: Performed at [facility], [address], [place], [place] [address]  Lactic acid, plasma     Status: None   Collection Time: 10/23/20 10:35 AM  Result Value Ref Range   Lactic Acid, Venous 1.2 0.5 - 1.9 mmol/L    Comment: Performed at [facility], [address], [place], [place] [address]  CBC     Status: Abnormal   Collection Time: 10/23/20  1:14 PM  Result Value Ref Range   WBC 13.3 (H) 4.0 - 10.5 K/uL   RBC 4.13 (L) 4.22 - 5.81 MIL/uL   Hemoglobin 11.4 (L) 13.0 - 17.0 g/dL   HCT 91.6 (L) 94.5 - 03.8 %   MCV 90.8 80.0 - 100.0 fL   MCH 27.6 26.0 - 34.0 pg   MCHC 30.4 30.0 - 36.0 g/dL   RDW 88.2 80.0 - 34.9 %   Platelets 390 150 - 400 K/uL   nRBC 0.0 0.0 - 0.2 %    Comment: Performed at [facility], [address] [place], [place] [address]  Creatinine, serum     Status:  Abnormal   Collection Time: 10/23/20  1:14 PM  Result Value Ref Range   Creatinine, Ser 1.99 (H) 0.61 - 1.24 mg/dL   GFR, Estimated 41 (L) >60 mL/min    Comment: (NOTE) Calculated using the CKD-EPI Creatinine Equation (2021) Performed at [facility], [address] [place], [place] [address]   D-dimer, quantitative     Status: Abnormal   Collection Time: 10/23/20  1:14 PM  Result Value Ref Range    D-Dimer, Quant 3.15 (H) 0.00 - 0.50 ug/mL-FEU    Comment: (NOTE) At the manufacturer cut-off value of 0.5 g/mL FEU, this assay has a negative predictive value of 95-100%.This assay is intended for use in conjunction with a clinical pretest probability (PTP) assessment model to exclude pulmonary embolism (PE) and deep venous thrombosis (DVT) in outpatients suspected of PE or DVT. Results should be correlated with clinical presentation. Performed at [facility], [address], [place], [place] [address]    CT ABDOMEN PELVIS WO CONTRAST  Result Date: 10/23/2020 CLINICAL DATA:  Nausea and vomiting. Recent diagnosis of left hip infection EXAM: CT ABDOMEN AND PELVIS WITHOUT CONTRAST TECHNIQUE: Multidetector CT imaging of the abdomen and pelvis was performed following the standard protocol without IV contrast. COMPARISON:  None. FINDINGS: Lower chest:  Atelectasis at the lung bases.  Gynecomastia. Hepatobiliary: No focal liver abnormality.Layering gallbladder calculi. No pericholecystic inflammation or bile duct distention Pancreas: Unremarkable. Spleen: Unremarkable. Adrenals/Urinary Tract: Negative adrenals. Cystic density at the upper pole right kidney measuring up to 4.5 cm. Posteriorly there is vague increased density and accentuated fat stranding is seen around the right upper pole . negative bladder. Stomach/Bowel: Diffuse distention of the colon by gas and fluid levels. No bowel wall thickening or obstructive changes. Vascular/Lymphatic: No acute vascular abnormality. No mass or adenopathy. Reproductive:No pathologic findings. Other: No ascites or pneumoperitoneum. Fatty enlargement of the bilateral inguinal canal. Musculoskeletal: No acute abnormalities. Sacroiliac erosions. Stable sclerotic lesion in the left ilium. IMPRESSION: 1. Diarrheal state with diffuse gas and fluid distended colon. 2. Cystic density in the upper pole right kidney which may contain hemorrhage or  proteinaceous contents posteriorly. There is asymmetric stranding around the right kidney, please correlate for signs of ascending infection. Recommend follow-up of the cyst with either enhanced CT or MRI. 3. Cholelithiasis. Electronically Signed   By: Tiburcio Pea M.D.   On: 10/23/2020 07:09   DG Chest Port 1 View  Result Date: 10/23/2020 CLINICAL DATA:  45 year old male with history of fever and shortness of breath. EXAM: PORTABLE CHEST 1 VIEW COMPARISON:  No priors. FINDINGS: Lung volumes are low. Elevation of the right hemidiaphragm. No consolidative airspace disease. No pleural effusions. No pneumothorax. No pulmonary nodule or mass noted. Pulmonary vasculature and the cardiomediastinal silhouette are within normal limits. IMPRESSION: 1. Low lung volumes without radiographic evidence of acute cardiopulmonary disease. 2. Elevation of the right hemidiaphragm. Electronically Signed   By: Trudie Reed M.D.   On: 10/23/2020 08:27    Pending Labs Unresulted Labs (From admission, onward)     Start     Ordered   10/30/20 0500  Creatinine, serum  (enoxaparin (LOVENOX)    CrCl >/= 30 ml/min)  Weekly,   R     Comments: while on enoxaparin therapy    10/23/20 1148   10/24/20 0500  Comprehensive metabolic panel  Tomorrow morning,   R        10/23/20 1148   10/24/20 0500  CBC  Tomorrow morning,  R        10/23/20 1148   10/23/20 0746  Culture, blood (routine x 2)  BLOOD CULTURE X 2,   STAT      10/23/20 0745            Vitals/Pain Today's Vitals   10/23/20 2000 10/23/20 2015 10/23/20 2115 10/23/20 2130  BP: (!) 163/110 136/85  136/82  Pulse: (!) 107 (!) 108 (!) 101 (!) 110  Resp: (!) 21 (!) 22 18 18   Temp:      TempSrc:      SpO2: 91% 96% 91% (!) 88%  PainSc:        Isolation Precautions No active isolations  Medications Medications  amLODipine (NORVASC) tablet 10 mg (10 mg Oral Given 10/23/20 1331)  atorvastatin (LIPITOR) tablet 40 mg (40 mg Oral Given 10/23/20 1331)   ziprasidone (GEODON) capsule 80 mg (80 mg Oral Given 10/23/20 2136)  melatonin tablet 3 mg (has no administration in time range)  divalproex (DEPAKOTE ER) 24 hr tablet 500 mg (500 mg Oral Given 10/23/20 2137)  gabapentin (NEURONTIN) capsule 300 mg (300 mg Oral Given 10/23/20 2137)  insulin aspart (novoLOG) injection 0-9 Units (0 Units Subcutaneous Not Given 10/23/20 1730)  enoxaparin (LOVENOX) injection 40 mg (40 mg Subcutaneous Given 10/23/20 1824)  0.9 %  sodium chloride infusion ( Intravenous New Bag/Given 10/23/20 1156)  acetaminophen (TYLENOL) tablet 650 mg (has no administration in time range)    Or  acetaminophen (TYLENOL) suppository 650 mg (has no administration in time range)  oxyCODONE (Oxy IR/ROXICODONE) immediate release tablet 5 mg (5 mg Oral Given 10/23/20 2137)  morphine 2 MG/ML injection 2 mg (has no administration in time range)  ondansetron (ZOFRAN) tablet 4 mg (has no administration in time range)    Or  ondansetron (ZOFRAN) injection 4 mg (has no administration in time range)  metoprolol tartrate (LOPRESSOR) injection 5 mg (has no administration in time range)  simethicone (MYLICON) chewable tablet 80 mg (has no administration in time range)  Ampicillin-Sulbactam (UNASYN) 3 g in sodium chloride 0.9 % 100 mL IVPB (0 g Intravenous Stopped 10/23/20 1955)  vancomycin (VANCOCIN) IVPB 1000 mg/200 mL premix (has no administration in time range)  albuterol (PROVENTIL) (2.5 MG/3ML) 0.083% nebulizer solution 2.5 mg (has no administration in time range)  albuterol (PROVENTIL) (2.5 MG/3ML) 0.083% nebulizer solution 2.5 mg (has no administration in time range)  lisinopril (ZESTRIL) tablet 40 mg (40 mg Oral Given 10/23/20 2136)    And  hydrochlorothiazide (HYDRODIURIL) tablet 25 mg (25 mg Oral Given 10/23/20 2137)  sodium chloride 0.9 % bolus 1,000 mL (0 mLs Intravenous Stopped 10/23/20 0849)  fentaNYL (SUBLIMAZE) injection 50 mcg (50 mcg Intravenous Given 10/23/20 0639)  ondansetron (ZOFRAN)  injection 4 mg (4 mg Intravenous Given 10/23/20 0639)  potassium chloride 10 mEq in 100 mL IVPB (0 mEq Intravenous Stopped 10/23/20 1015)  Ampicillin-Sulbactam (UNASYN) 3 g in sodium chloride 0.9 % 100 mL IVPB (0 g Intravenous Stopped 10/23/20 1454)  vancomycin (VANCOREADY) IVPB 2000 mg/400 mL (0 mg Intravenous Stopped 10/23/20 1330)  potassium chloride 10 mEq in 100 mL IVPB (0 mEq Intravenous Stopped 10/23/20 1955)    Mobility non-ambulatory

## 2020-10-23 NOTE — ED Notes (Signed)
Pt had large BM.  Liquid.  Cleansed.  Pt yelling not wanting to cooperative with staff.  Pt cleansed, diaper applied, new bedding.  Call bell in reach

## 2020-10-23 NOTE — Progress Notes (Signed)
Pharmacy Note   A consult was received from an ED physician for vancomycin per pharmacy dosing.    The patient's profile has been reviewed for ht/wt/allergies/indication/available labs.    A one time order has been placed for vancomycin 2000 mg IV x 1.    Further antibiotics/pharmacy consults should be ordered by admitting physician if indicated.                       Thank you,  Adalberto Cole, PharmD, BCPS 10/23/2020 10:14 AM

## 2020-10-23 NOTE — ED Provider Notes (Signed)
Patient seen after prior EDP.  Patient would benefit from admission for further treatment and evaluation.  Hospitalist service is aware of case and will evaluate for admission.   Wynetta Fines, MD 10/23/20 1019

## 2020-10-23 NOTE — ED Notes (Signed)
Second IV started in pts left wrist.  Due to 22 in right had not optimal.  Potassium infusing as ordered.

## 2020-10-23 NOTE — ED Notes (Signed)
Pt had large BM.  Cleansed, bedding total change.  Pt uncooperative with staff.

## 2020-10-23 NOTE — ED Notes (Signed)
Pt given additional blankets 

## 2020-10-23 NOTE — ED Notes (Signed)
Additional lab work sent.

## 2020-10-23 NOTE — Progress Notes (Signed)
Pharmacy Antibiotic Note  Kyle Montgomery is a 45 y.o. male admitted on 10/23/2020 with pneumonia.  Pharmacy has been consulted for vancomycin and Unasyn  dosing.  Plan: Unasyn 3 gr IV q6h  Vancomycin 2000 mg IV x1 then 1000 mg IV q24h  Monitor clinical course, renal function, cultures as available      Temp (24hrs), Avg:98.7 F (37.1 C), Min:98.7 F (37.1 C), Max:98.7 F (37.1 C)  Recent Labs  Lab 10/17/20 1222 10/19/20 0350 10/23/20 0633 10/23/20 0730 10/23/20 0825 10/23/20 1035  WBC  --  9.1 13.3*  --   --   --   CREATININE  --  1.96*  --  2.13*  --   --   LATICACIDVEN  --   --   --   --  1.4 1.2  VANCOPEAK 10*  --   --   --   --   --     Estimated Creatinine Clearance: 51 mL/min (A) (by C-G formula based on SCr of 2.13 mg/dL (H)).    No Known Allergies  Antimicrobials this admission: 10/3 Unasyn >>  10/3 vancomycin >>   Dose adjustments this admission:   Microbiology results: 10/3 BCx:     Thank you for allowing pharmacy to be a part of this patient's care.   Adalberto Cole, PharmD, BCPS 10/23/2020 12:51 PM

## 2020-10-23 NOTE — ED Triage Notes (Signed)
Patient BIB [facility] from home. Recently diagnosed with infection to left hip, that he is being treated for. Discharged from [facility] Friday. Patient was discharged with home health, but patient refuses to let home health nurse in the house. Patient is immobile. Patient has been vomiting the last 6 hours and his left hip hurts.

## 2020-10-23 NOTE — ED Provider Notes (Signed)
[facility]-EMERGENCY DEPT Provider Note   CSN: [id number] Arrival date & time: 10/23/20  0425     History Chief Complaint  Patient presents with   Hip Pain   Emesis    Kyle Montgomery is a 45 y.o. male.  HPI 45 year old male presents with abdominal pain and vomiting.  The patient states that since he has gotten home from the hospital he had continued but improving hip pain.  However he has been having upper abdominal pain and vomiting since this morning.  He has not had a bowel movement in a couple days, had 1 upon discharge but none since.  No fevers or chest pain or shortness of breath.  Vomited multiple times, no blood.  Past Medical History:  Diagnosis Date   Bipolar affective (HCC)    followed by mental health   Gynecomastia 02/07   normal pituitary by MRI   Hyperprolactinemia (HCC) 09/27/04   felt 2/2 lithium and fluphenazine   Hypertension    Renal insufficiency    baseline Cr 2.2 - renal US with increased Echo signal, no nephrology eval, no bx    Patient Active Problem List   Diagnosis Date Noted   Septic arthritis (HCC) 10/14/2020   Left hip pain 10/13/2020   Type 2 diabetes mellitus treated without insulin (HCC) 04/15/2017   Hyperlipidemia 09/06/2014   Healthcare maintenance 12/01/2012   CKD (chronic kidney disease), stage III (HCC) 09/01/2012   Bipolar affective (HCC) 11/06/2005   Essential hypertension 11/06/2005   Gynecomastia 11/06/2005    Past Surgical History:  Procedure Laterality Date   DENTAL SURGERY         Family History  Problem Relation Age of Onset   Hyperlipidemia Mother    Hypertension Mother    Atrial fibrillation Mother    Hypertension Brother    Hypertension Maternal Grandmother    Cancer Maternal Grandmother     Social History   Tobacco Use   Smoking status: Former    Types: Cigarettes    Quit date: 01/22/1983    Years since quitting: 37.7   Smokeless tobacco: Never  Vaping Use   Vaping Use: Never  used  Substance Use Topics   Alcohol use: No    Alcohol/week: 0.0 standard drinks   Drug use: No    Home Medications Prior to Admission medications   Medication Sig Start Date End Date Taking? Authorizing Provider  acetaminophen (TYLENOL) 325 MG tablet Take 2 tablets (650 mg total) by mouth every 6 (six) hours as needed. 11/25/17   Theotis Barrio, MD  amLODipine (NORVASC) 10 MG tablet Take 1 tablet (10 mg total) by mouth daily. 02/23/20   Belva Agee, MD  amoxicillin-clavulanate (AUGMENTIN) 875-125 MG tablet Take 1 tablet by mouth every 12 (twelve) hours. 10/19/20   Steffanie Rainwater, MD  atorvastatin (LIPITOR) 40 MG tablet Take 1 tablet (40 mg total) by mouth daily. 02/23/20   Belva Agee, MD  divalproex (DEPAKOTE ER) 500 MG 24 hr tablet Take 1 tablet (500 mg total) by mouth at bedtime. 02/23/20 10/14/20  Belva Agee, MD  doxycycline (VIBRA-TABS) 100 MG tablet Take 1 tablet (100 mg total) by mouth every 12 (twelve) hours. 10/19/20   Steffanie Rainwater, MD  gabapentin (NEURONTIN) 300 MG capsule Take 1 capsule (300 mg total) by mouth at bedtime. 10/19/20 11/18/20  Steffanie Rainwater, MD  lisinopril-hydrochlorothiazide (ZESTORETIC) 20-12.5 MG tablet Take 2 tablets by mouth daily. 02/23/20   Belva Agee, MD  melatonin 3 MG TABS  tablet Take 1 tablet (3 mg total) by mouth at bedtime as needed. 10/19/20 11/18/20  Steffanie Rainwater, MD  oxyCODONE (OXY IR/ROXICODONE) 5 MG immediate release tablet Take 1 tablet (5 mg total) by mouth every 6 (six) hours as needed for up to 5 days for moderate pain or breakthrough pain. 10/19/20 10/24/20  Steffanie Rainwater, MD  polyethylene glycol (MIRALAX / GLYCOLAX) 17 g packet Take 17 g by mouth daily as needed for mild constipation. 10/19/20   Steffanie Rainwater, MD  ziprasidone (GEODON) 80 MG capsule Take 1 capsule (80 mg total) by mouth 2 (two) times daily. 10/19/20 01/17/21  Steffanie Rainwater, MD    Allergies    Patient has no  known allergies.  Review of Systems   Review of Systems  Constitutional:  Negative for fever.  Respiratory:  Negative for shortness of breath.   Cardiovascular:  Negative for chest pain.  Gastrointestinal:  Positive for abdominal pain, constipation and vomiting. Negative for abdominal distention.  All other systems reviewed and are negative.  Physical Exam Updated Vital Signs BP (!) 132/91 (BP Location: Right Arm)   Pulse 95   Temp 98.7 F (37.1 C) (Oral)   Resp 17   SpO2 91%   Physical Exam Vitals and nursing note reviewed.  Constitutional:      General: He is not in acute distress.    Appearance: He is well-developed. He is not ill-appearing or diaphoretic.  HENT:     Head: Normocephalic and atraumatic.     Right Ear: External ear normal.     Left Ear: External ear normal.     Nose: Nose normal.  Eyes:     General:        Right eye: No discharge.        Left eye: No discharge.  Cardiovascular:     Rate and Rhythm: Normal rate and regular rhythm.     Heart sounds: Normal heart sounds.  Pulmonary:     Effort: Pulmonary effort is normal.     Breath sounds: Normal breath sounds.  Abdominal:     Palpations: Abdomen is soft.     Tenderness: There is generalized abdominal tenderness.  Musculoskeletal:     Cervical back: Neck supple.  Skin:    General: Skin is warm and dry.  Neurological:     Mental Status: He is alert.  Psychiatric:        Mood and Affect: Mood is not anxious.    ED Results / Procedures / Treatments   Labs (all labs ordered are listed, but only abnormal results are displayed) Labs Reviewed  COMPREHENSIVE METABOLIC PANEL  LIPASE, BLOOD  CBC WITH DIFFERENTIAL/PLATELET  URINALYSIS, ROUTINE W REFLEX MICROSCOPIC    EKG None  Radiology No results found.  Procedures Procedures   Medications Ordered in ED Medications  sodium chloride 0.9 % bolus 1,000 mL (has no administration in time range)  fentaNYL (SUBLIMAZE) injection 50 mcg (has no  administration in time range)  ondansetron (ZOFRAN) injection 4 mg (has no administration in time range)    ED Course  I have reviewed the triage vital signs and the nursing notes.  Pertinent labs & imaging results that were available during my care of the patient were reviewed by me and considered in my medical decision making (see chart for details).    MDM Rules/Calculators/A&P  Patient with vomiting and diffuse abdominal discomfort.  He has not had a bowel movement in a few days.  Will get CT to rule out obstruction, ileus, constipation.  Overall he is medically stable however.  Labs and CT currently pending.  Care to Dr. Rodena Medin. Final Clinical Impression(s) / ED Diagnoses Final diagnoses:  None    Rx / DC Orders ED Discharge Orders     None        Pricilla Loveless, MD 10/23/20 [phone]

## 2020-10-23 NOTE — H&P (Addendum)
History and Physical    OTT ZIMMERLE NOB:[id number] DOB: 1975/05/28 DOA: 10/23/2020  PCP: Andrey Campanile, MD  Patient coming from: Home  Chief Complaint: N/V; hip pain  HPI: Kyle Montgomery is a 45 y.o. male with medical history significant of DM2, bipolar d/o, CKD3b, HLD. Presenting with nausea an vomiting. He was recently admitted to the [facility] for septic arthritis of the left SI joint. He was started on IV abx and eval'd by orthopedics and IR. There was no abscess or fluid to sample. He was responding to abx. So he was discharged on doxy and augmentin on 10/19/20. The patient reports that after he got home, his stomach hurt off and on. He is unable to describe the pain. He had no fevers. He reports yesterday that he had a lot on N/V and was unable to keep anything down. He tried the pain meds and abx that he was discharged on, but they did not help. This morning when his symptoms continued, family called 911.   ED Course: CT ab/pelvis showed diffuse gas w/ fluid distended colon. It also showed a cystic density in the upper pole of the right kidney w/ some asymmetric stranding around the right kidney. UA was negative. The patient was hypoxic. CXR did not show PNA. He had a mildly elevated white count. He was tachycardic and tachypneic. COVID/flu were negative. He was started on vanc and unasyn for concern of aspiration. TRH was called for admission.   Review of Systems:  Review of systems is otherwise negative for all not mentioned in HPI.   PMHx Past Medical History:  Diagnosis Date   Bipolar affective (HCC)    followed by mental health   Gynecomastia 02/07   normal pituitary by MRI   Hyperprolactinemia (HCC) 09/27/04   felt 2/2 lithium and fluphenazine   Hypertension    Renal insufficiency    baseline Cr 2.2 - renal US with increased Echo signal, no nephrology eval, no bx    PSHx Past Surgical History:  Procedure Laterality Date   DENTAL SURGERY      SocHx   reports that he quit smoking about 37 years ago. His smoking use included cigarettes. He has never used smokeless tobacco. He reports that he does not drink alcohol and does not use drugs.  No Known Allergies  FamHx Family History  Problem Relation Age of Onset   Hyperlipidemia Mother    Hypertension Mother    Atrial fibrillation Mother    Hypertension Brother    Hypertension Maternal Grandmother    Cancer Maternal Grandmother     Prior to Admission medications   Medication Sig Start Date End Date Taking? Authorizing Provider  acetaminophen (TYLENOL) 325 MG tablet Take 2 tablets (650 mg total) by mouth every 6 (six) hours as needed. 11/25/17   Theotis Barrio, MD  amLODipine (NORVASC) 10 MG tablet Take 1 tablet (10 mg total) by mouth daily. 02/23/20   Belva Agee, MD  amoxicillin-clavulanate (AUGMENTIN) 875-125 MG tablet Take 1 tablet by mouth every 12 (twelve) hours. 10/19/20   Steffanie Rainwater, MD  atorvastatin (LIPITOR) 40 MG tablet Take 1 tablet (40 mg total) by mouth daily. 02/23/20   Belva Agee, MD  divalproex (DEPAKOTE ER) 500 MG 24 hr tablet Take 1 tablet (500 mg total) by mouth at bedtime. 02/23/20 10/14/20  Belva Agee, MD  doxycycline (VIBRA-TABS) 100 MG tablet Take 1 tablet (100 mg total) by mouth every 12 (twelve) hours. 10/19/20   Amponsah,  Flossie Buffy, MD  gabapentin (NEURONTIN) 300 MG capsule Take 1 capsule (300 mg total) by mouth at bedtime. 10/19/20 11/18/20  Steffanie Rainwater, MD  lisinopril-hydrochlorothiazide (ZESTORETIC) 20-12.5 MG tablet Take 2 tablets by mouth daily. 02/23/20   Katsadouros, Vasilios, MD  melatonin 3 MG TABS tablet Take 1 tablet (3 mg total) by mouth at bedtime as needed. 10/19/20 11/18/20  Steffanie Rainwater, MD  oxyCODONE (OXY IR/ROXICODONE) 5 MG immediate release tablet Take 1 tablet (5 mg total) by mouth every 6 (six) hours as needed for up to 5 days for moderate pain or breakthrough pain. 10/19/20 10/24/20  Steffanie Rainwater,  MD  polyethylene glycol (MIRALAX / GLYCOLAX) 17 g packet Take 17 g by mouth daily as needed for mild constipation. 10/19/20   Steffanie Rainwater, MD  ziprasidone (GEODON) 80 MG capsule Take 1 capsule (80 mg total) by mouth 2 (two) times daily. 10/19/20 01/17/21  Steffanie Rainwater, MD    Physical Exam: Vitals:   10/23/20 0821 10/23/20 0830 10/23/20 0900 10/23/20 0930  BP: 140/87 (!) 148/96 (!) 146/99 (!) 148/99  Pulse: (!) 103 98 96 96  Resp: 18 (!) 22 19 (!) 24  Temp:      TempSrc:      SpO2: 92% 92% 94% 95%    General: 45 y.o. male resting in bed in NAD Eyes: PERRL, normal sclera ENMT: Nares patent w/o discharge, orophaynx clear, dentition normal, ears w/o discharge/lesions/ulcers Neck: Supple, trachea midline Cardiovascular: RRR, +S1, S2, no m/g/r, equal pulses throughout Respiratory: CTABL, no w/r/r, normal WOB GI: BS+, NDNT, no masses noted, no organomegaly noted MSK: No e/c/c Skin: No rashes, bruises, ulcerations noted Neuro: A&O x 3, no focal deficits Psyc: Appropriate interaction and affect, calm/cooperative  Labs on Admission: I have personally reviewed following labs and imaging studies  CBC: Recent Labs  Lab 10/19/20 0350 10/23/20 0633  WBC 9.1 13.3*  NEUTROABS 6.2 11.5*  HGB 11.5* 12.8*  HCT 36.3* 40.4  MCV 87.5 86.9  PLT 304 461*   Basic Metabolic Panel: Recent Labs  Lab 10/19/20 0350 10/23/20 0730  NA 141 142  K 3.8 3.1*  CL 105 104  CO2 24 25  GLUCOSE 117* 148*  BUN 27* 44*  CREATININE 1.96* 2.13*  CALCIUM 9.7 10.0   GFR: Estimated Creatinine Clearance: 51 mL/min (A) (by C-G formula based on SCr of 2.13 mg/dL (H)). Liver Function Tests: Recent Labs  Lab 10/19/20 0350 10/23/20 0730  AST 44* 39  ALT 58* 56*  ALKPHOS 60 57  BILITOT 0.6 0.6  PROT 7.6 8.3*  ALBUMIN 2.7* 3.2*   Recent Labs  Lab 10/23/20 0730  LIPASE 66*   No results for input(s): AMMONIA in the last 168 hours. Coagulation Profile: No results for input(s): INR,  PROTIME in the last 168 hours. Cardiac Enzymes: No results for input(s): CKTOTAL, CKMB, CKMBINDEX, TROPONINI in the last 168 hours. BNP (last 3 results) No results for input(s): PROBNP in the last 8760 hours. HbA1C: No results for input(s): HGBA1C in the last 72 hours. CBG: Recent Labs  Lab 10/18/20 1213 10/18/20 1710 10/18/20 2040 10/19/20 0742 10/19/20 1213  GLUCAP 89 119* 130* 120* 142*   Lipid Profile: No results for input(s): CHOL, HDL, LDLCALC, TRIG, CHOLHDL, LDLDIRECT in the last 72 hours. Thyroid Function Tests: No results for input(s): TSH, T4TOTAL, FREET4, T3FREE, THYROIDAB in the last 72 hours. Anemia Panel: No results for input(s): VITAMINB12, FOLATE, FERRITIN, TIBC, IRON, RETICCTPCT in the last 72 hours. Urine analysis:  Component Value Date/Time   COLORURINE YELLOW 10/23/2020 0649   APPEARANCEUR CLEAR 10/23/2020 0649   LABSPEC 1.012 10/23/2020 0649   PHURINE 5.0 10/23/2020 0649   GLUCOSEU NEGATIVE 10/23/2020 0649   GLUCOSEU NEG mg/dL 87/56/4332 9518   HGBUR SMALL (A) 10/23/2020 0649   BILIRUBINUR NEGATIVE 10/23/2020 0649   KETONESUR NEGATIVE 10/23/2020 0649   PROTEINUR 100 (A) 10/23/2020 0649   UROBILINOGEN 0.2 01/09/2012 1007   NITRITE NEGATIVE 10/23/2020 0649   LEUKOCYTESUR NEGATIVE 10/23/2020 0649    Radiological Exams on Admission: CT ABDOMEN PELVIS WO CONTRAST  Result Date: 10/23/2020 CLINICAL DATA:  Nausea and vomiting. Recent diagnosis of left hip infection EXAM: CT ABDOMEN AND PELVIS WITHOUT CONTRAST TECHNIQUE: Multidetector CT imaging of the abdomen and pelvis was performed following the standard protocol without IV contrast. COMPARISON:  None. FINDINGS: Lower chest:  Atelectasis at the lung bases.  Gynecomastia. Hepatobiliary: No focal liver abnormality.Layering gallbladder calculi. No pericholecystic inflammation or bile duct distention Pancreas: Unremarkable. Spleen: Unremarkable. Adrenals/Urinary Tract: Negative adrenals. Cystic density at the  upper pole right kidney measuring up to 4.5 cm. Posteriorly there is vague increased density and accentuated fat stranding is seen around the right upper pole . negative bladder. Stomach/Bowel: Diffuse distention of the colon by gas and fluid levels. No bowel wall thickening or obstructive changes. Vascular/Lymphatic: No acute vascular abnormality. No mass or adenopathy. Reproductive:No pathologic findings. Other: No ascites or pneumoperitoneum. Fatty enlargement of the bilateral inguinal canal. Musculoskeletal: No acute abnormalities. Sacroiliac erosions. Stable sclerotic lesion in the left ilium. IMPRESSION: 1. Diarrheal state with diffuse gas and fluid distended colon. 2. Cystic density in the upper pole right kidney which may contain hemorrhage or proteinaceous contents posteriorly. There is asymmetric stranding around the right kidney, please correlate for signs of ascending infection. Recommend follow-up of the cyst with either enhanced CT or MRI. 3. Cholelithiasis. Electronically Signed   By: Tiburcio Pea M.D.   On: 10/23/2020 07:09   DG Chest Port 1 View  Result Date: 10/23/2020 CLINICAL DATA:  45 year old male with history of fever and shortness of breath. EXAM: PORTABLE CHEST 1 VIEW COMPARISON:  No priors. FINDINGS: Lung volumes are low. Elevation of the right hemidiaphragm. No consolidative airspace disease. No pleural effusions. No pneumothorax. No pulmonary nodule or mass noted. Pulmonary vasculature and the cardiomediastinal silhouette are within normal limits. IMPRESSION: 1. Low lung volumes without radiographic evidence of acute cardiopulmonary disease. 2. Elevation of the right hemidiaphragm. Electronically Signed   By: Trudie Reed M.D.   On: 10/23/2020 08:27    EKG: Independently reviewed. Sinus, no st elevations  Assessment/Plan SIRS     - admit to inpt, tele     - started on unasyn, vanc for presumed asp pna; can continue for now     - follow blood Cx; UA looks ok  Hypoxia      - reports N/V; believed to have had possible aspiration     - COVID/flu negative     - on unasyn/vanc; continue for now     - nebs, IS     - lung exam w/ decreased sounds at bases     - check d-dimer, if elevated send for CTA  N/V/abdominal pain     - anti-emetics, fluids     - CT ab/pelvis shows diarrheal state w/ fluid and gas distention, but abdominal exam is normal and he had a BM this morning     - add simethicone; check stool studies     - CT shows  right upper pole kidneys cystic lesion w/ some asymmetric stranding; UA is normal, no physical exam correlation at this time  Hypokalemia     - check Mg2+; replace K+  DM2     - SSI, glucose checks, DM diet  HLD     - continue statin  CKD3b     - at baseline, watch nephrotoxin  DVT prophylaxis: lovenox  Code Status: FULL  Family Communication: w/ mother by phone  Consults called: none   Status is: Inpatient  Remains inpatient appropriate because:Inpatient level of care appropriate due to severity of illness  Dispo: The patient is from: Home              Anticipated d/c is to: Home              Patient currently is not medically stable to d/c.   Difficult to place patient No  Time spent coordinating admission: 70 minutes  Shirlyn Savin A Assia Meanor DO [facility]  If 7PM-7AM, please contact night-coverage [email or web address]  10/23/2020, 10:17 AM

## 2020-10-24 ENCOUNTER — Inpatient Hospital Stay (HOSPITAL_COMMUNITY): Payer: Medicaid Other

## 2020-10-24 DIAGNOSIS — E785 Hyperlipidemia, unspecified: Secondary | ICD-10-CM

## 2020-10-24 DIAGNOSIS — I10 Essential (primary) hypertension: Secondary | ICD-10-CM | POA: Diagnosis not present

## 2020-10-24 DIAGNOSIS — K5931 Toxic megacolon: Secondary | ICD-10-CM | POA: Diagnosis present

## 2020-10-24 DIAGNOSIS — F3170 Bipolar disorder, currently in remission, most recent episode unspecified: Secondary | ICD-10-CM

## 2020-10-24 DIAGNOSIS — N1832 Chronic kidney disease, stage 3b: Secondary | ICD-10-CM

## 2020-10-24 DIAGNOSIS — E876 Hypokalemia: Secondary | ICD-10-CM

## 2020-10-24 DIAGNOSIS — K567 Ileus, unspecified: Secondary | ICD-10-CM

## 2020-10-24 DIAGNOSIS — M009 Pyogenic arthritis, unspecified: Secondary | ICD-10-CM

## 2020-10-24 DIAGNOSIS — R0902 Hypoxemia: Secondary | ICD-10-CM | POA: Diagnosis present

## 2020-10-24 LAB — COMPREHENSIVE METABOLIC PANEL
ALT: 42 U/L (ref 0–44)
AST: 29 U/L (ref 15–41)
Albumin: 2.9 g/dL — ABNORMAL LOW (ref 3.5–5.0)
Alkaline Phosphatase: 52 U/L (ref 38–126)
Anion gap: 9 (ref 5–15)
BUN: 37 mg/dL — ABNORMAL HIGH (ref 6–20)
CO2: 23 mmol/L (ref 22–32)
Calcium: 10.2 mg/dL (ref 8.9–10.3)
Chloride: 115 mmol/L — ABNORMAL HIGH (ref 98–111)
Creatinine, Ser: 1.92 mg/dL — ABNORMAL HIGH (ref 0.61–1.24)
GFR, Estimated: 43 mL/min — ABNORMAL LOW (ref >60)
Glucose, Bld: 131 mg/dL — ABNORMAL HIGH (ref 70–99)
Potassium: 3.6 mmol/L (ref 3.5–5.1)
Sodium: 147 mmol/L — ABNORMAL HIGH (ref 135–145)
Total Bilirubin: 0.6 mg/dL (ref 0.3–1.2)
Total Protein: 7.8 g/dL (ref 6.5–8.1)

## 2020-10-24 LAB — CBC
HCT: 38 % — ABNORMAL LOW (ref 39.0–52.0)
Hemoglobin: 12.1 g/dL — ABNORMAL LOW (ref 13.0–17.0)
MCH: 27.8 pg (ref 26.0–34.0)
MCHC: 31.8 g/dL (ref 30.0–36.0)
MCV: 87.4 fL (ref 80.0–100.0)
Platelets: 409 10*3/uL — ABNORMAL HIGH (ref 150–400)
RBC: 4.35 MIL/uL (ref 4.22–5.81)
RDW: 13.7 % (ref 11.5–15.5)
WBC: 14.1 10*3/uL — ABNORMAL HIGH (ref 4.0–10.5)
nRBC: 0 % (ref 0.0–0.2)

## 2020-10-24 LAB — GLUCOSE, CAPILLARY
Glucose-Capillary: 120 mg/dL — ABNORMAL HIGH (ref 70–99)
Glucose-Capillary: 112 mg/dL — ABNORMAL HIGH (ref 70–99)
Glucose-Capillary: 124 mg/dL — ABNORMAL HIGH (ref 70–99)
Glucose-Capillary: 94 mg/dL (ref 70–99)
Glucose-Capillary: 131 mg/dL — ABNORMAL HIGH (ref 70–99)

## 2020-10-24 MED ORDER — POTASSIUM CHLORIDE 2 MEQ/ML IV SOLN: INTRAVENOUS | Status: DC

## 2020-10-24 MED ORDER — POTASSIUM CHLORIDE 10 MEQ/100ML IV SOLN: 10.0000 meq | INTRAVENOUS | Status: DC

## 2020-10-24 MED ORDER — POTASSIUM CHLORIDE CRYS ER 20 MEQ PO TBCR
40.0000 meq | EXTENDED_RELEASE_TABLET | Freq: Once | ORAL | Status: AC
  Administered 2020-10-24: 40 meq via ORAL
  Filled 2020-10-24: qty 2

## 2020-10-24 MED ORDER — OXYCODONE HCL 5 MG PO TABS
5.0000 mg | ORAL_TABLET | Freq: Three times a day (TID) | ORAL | Status: DC | PRN
  Administered 2020-10-26 – 2020-10-31 (×5): 5 mg via ORAL
  Filled 2020-10-24 (×6): qty 1

## 2020-10-24 MED ORDER — KCL IN DEXTROSE-NACL 40-5-0.45 MEQ/L-%-% IV SOLN
INTRAVENOUS | Status: DC
  Filled 2020-10-24 (×14): qty 1000

## 2020-10-24 MED ORDER — MORPHINE SULFATE (PF) 2 MG/ML IV SOLN: 1.0000 mg | INTRAVENOUS | Status: DC | PRN

## 2020-10-24 MED ORDER — HYDROMORPHONE HCL 1 MG/ML IJ SOLN
0.5000 mg | Freq: Four times a day (QID) | INTRAMUSCULAR | Status: DC | PRN
  Administered 2020-10-25 – 2020-10-31 (×4): 0.5 mg via INTRAVENOUS
  Filled 2020-10-24 (×5): qty 0.5

## 2020-10-24 MED ORDER — VANCOMYCIN HCL 1500 MG/300ML IV SOLN
1500.0000 mg | INTRAVENOUS | Status: DC
  Administered 2020-10-24 – 2020-10-27 (×4): 1500 mg via INTRAVENOUS
  Filled 2020-10-24 (×4): qty 300

## 2020-10-24 MED ORDER — ORAL CARE MOUTH RINSE
15.0000 mL | Freq: Two times a day (BID) | OROMUCOSAL | Status: DC
  Administered 2020-10-24 – 2020-11-01 (×14): 15 mL via OROMUCOSAL

## 2020-10-24 NOTE — Progress Notes (Signed)
Pharmacy Antibiotic Note  Kyle Montgomery is a 45 y.o. male admitted on 10/23/2020 with pneumonia.  Pharmacy has been consulted for vancomycin and Unasyn  dosing.  Today, Patient is afebrile, WBC elevated at 14.1, and serum creatinine has trended down to 1.92.  Plan: Continue Unasyn 3 g IV q6h  Change vancomycin to 1500 mg IV every 24 hours (Goal AUC 400-550, Expected AUC: 496.7, SCr used: 1.92)  Monitor clinical course, renal function, cultures as available   Height: 5\' 10"  (177.8 cm) Weight: 91.1 kg (200 lb 13.4 oz) IBW/kg (Calculated) : 73  Temp (24hrs), Avg:98.5 F (36.9 C), Min:98.2 F (36.8 C), Max:99 F (37.2 C)  Recent Labs  Lab 10/17/20 1222 10/19/20 0350 10/23/20 0633 10/23/20 0730 10/23/20 0825 10/23/20 1035 10/23/20 1314 10/24/20 0527  WBC  --  9.1 13.3*  --   --   --  13.3* 14.1*  CREATININE  --  1.96*  --  2.13*  --   --  1.99* 1.92*  LATICACIDVEN  --   --   --   --  1.4 1.2  --   --   VANCOPEAK 10*  --   --   --   --   --   --   --      Estimated Creatinine Clearance: 55.1 mL/min (A) (by C-G formula based on SCr of 1.92 mg/dL (H)).    No Known Allergies  Antimicrobials this admission: 10/3 Unasyn >>  10/3 vancomycin >>   Dose adjustments this admission: 10/4 Increase vancomycin to 1500 mg IV every 24 hours  Microbiology results: 10/3 BCx: pending    Thank you for allowing pharmacy to be a part of this patient's care.   12/4, PharmD, BCPS Clinical Pharmacist [place] Please utilize Amion for appropriate phone number to reach the unit pharmacist [facility]) 10/24/2020 9:55 AM

## 2020-10-24 NOTE — Progress Notes (Addendum)
PROGRESS NOTE    THANH MOTTERN  HYQ:[id number] DOB: 03-25-1975 DOA: 10/23/2020 PCP: Orvis Brill, MD   Chief Complaint  Patient presents with   Hip Pain   Emesis    Brief Narrative:  Patient is a 45 year old gentleman history of type 2 diabetes, bipolar disorder, CKD 3B, hyperlipidemia presented to the ED with nausea and vomiting.  Patient noted to have been admitted to the internal medicine residency service for septic arthritis recently over the left SI joint was on IV antibiotics evaluated by orthopedics and IR no abscess of fluid to sample responded to antibiotics and discharged home on Doxy and Augmentin on 10/19/2020.  It is noted that after patient got home started to have abdominal pain no fever had increased nausea and vomiting with inability to keep anything down.  Patient brought to the hospital.  CT abdomen and pelvis showed diffuse gas with fluid distended colon, cystic density in the upper pole of the right kidney with some asymmetric stranding around the right kidney, UA was negative.  Patient noted to be hypoxic.  Chest x-ray unremarkable.  Mildly elevated white count, noted to be tachycardic and tachypneic.  COVID-19 PCR and flu was negative.  Patient placed empirically on IV Vanco and Unasyn due to concerns for aspiration.  Hospitalist called for admission.  Repeat abdominal films done due to abdominal distention on 10/24/2020 with concerns for colonic ileus.  GI consulted.   Assessment & Plan:   Principal Problem:   Ileus [facility]): Colonic Active Problems:   Bipolar affective ([place])   Essential hypertension   CKD (chronic kidney disease), stage III (HCC)   Hyperlipidemia   Septic arthritis (HCC)   SIRS (systemic inflammatory response syndrome) (HCC)   Hypoxia   Hypokalemia  #1 colonic ileus -Patient presented with nausea vomiting abdominal pain abdominal distention. -CT abdomen and pelvis done on admission with a diarrheal state with diffuse gas and fluid  distended colon. -Patient with ongoing emesis this morning and as such repeat abdominal films obtained which were concerning for diffuse colonic dilatation increase in size compared to prior findings concerning for worsening colonic ileus. -We will make patient n.p.o. except meds. -Replete electrolytes try to keep potassium approximately at 4 and magnesium at approximately 2. -Out of bed to chair if able to. -Hydration with IV fluids. -Minimize narcotics. -Repeat abdominal films in the a.m. -Consult with GI for further evaluation and management and if no significant improvement or worsening may need colonic decompression.  2.  Hypoxia, concern for aspiration pneumonia -Patient on admission noted to be hypoxic and patient presented with nausea and vomiting with concerns for aspiration pneumonia. -COVID-19 PCR, flu negative. -Chest x-ray done on admission with low lung volumes without radiographic evidence of acute cardiopulmonary disease, elevation of right hemidiaphragm. -D-dimer elevated at 3.15. -Patient with CKD stage IIIb and as such we will check a VQ scan to rule out PE.  -Continue empiric IV Unasyn. -IV vancomycin discontinued.  3.  Hypokalemia -Potassium at 3.6. -K-Dur 40 mEq p.o. x1 to keep potassium approximately around 4.  4.  Dehydration -Change IV fluids to D5 half-normal saline with KCl 40 mEq at 125 cc an hour. -Discontinue HCTZ.  5.  CKD 3B -Stable.  6.  Well-controlled diabetes mellitus type 2 -Hemoglobin A1c 5.9 (10/14/2020). -CBG at 120 this morning. -Change CBGs to every 6 hours. -Continue Neurontin. -SSI.  7.  Hyperlipidemia -Hold statin.  8.  SIRS -Patient met criteria for SIRS on admission with leukocytosis, tachypnea, tachycardia. -Urinalysis nitrite  negative, leukocytes negative. -Chest x-ray with no acute infiltrate noted. -Repeat chest x-ray in the morning once patient is hydrated. -Continue empiric IV Unasyn due to concerns for aspiration  pneumonia.  9.  Bipolar affective disorder -Continue home regimen Geodon, Depakote.  10.  Hypertension -Continue Norvasc, lisinopril. -Discontinue HCTZ.  11.  Recent septic arthritis of left SI joint -Patient recently hospitalized and discharged on 10/19/2020 for septic arthritis of the left SI joint. -During hospitalization patient placed on IV Vanco and IV Rocephin and orthopedics was consulted and IR consulted also for arthrocentesis however no abscess or fluid collection noted and patient treated conservatively with antibiotics.  Patient also placed on Neurontin due to complaints of lower extremity neuropathy.  Patient was discharged home on doxycycline and Augmentin. -Patient currently on Unasyn. -Patient with left hip pain with difficulty ambulating per mother and as such we will get an MRI of the left hip without contrast for further evaluation. -May need orthopedic input pending MRI results. -Supportive care.   DVT prophylaxis: Lovenox Code Status: Full Family Communication: Updated patient.  Updated mother on the telephone.  Disposition:   Status is: Inpatient  Remains inpatient appropriate because:Inpatient level of care appropriate due to severity of illness  Dispo: The patient is from: Home              Anticipated d/c is to: Home              Patient currently is not medically stable to d/c.   Difficult to place patient No       Consultants:  Gastroenterology pending  Procedures:  CT abdomen and pelvis 10/23/2020 Chest x-ray 10/23/2020 Abdominal films 10/24/2020  Antimicrobials:  IV Unasyn 10/23/2020>>>> IV vancomycin 10/23/2020>>>> 10/24/2020   Subjective: Patient laying in bed.  Denies any chest pain.  No significant shortness of breath.  Endorses a bout of nausea and emesis this morning.  No bowel movement today however stated had bowel movement yesterday.  Denies any significant abdominal pain.  Some complaints of left hip pain that he states is  improving.  Objective: Vitals:   10/24/20 0751 10/24/20 0809 10/24/20 1121 10/24/20 1408  BP:  (!) 152/100 137/89 135/88  Pulse:  97 (!) 102 97  Resp:  17 18 19   Temp:  99 F (37.2 C) 98.8 F (37.1 C) 98.6 F (37 C)  TempSrc:  Oral Oral Oral  SpO2: 92% 91% (!) 88% 92%  Weight:      Height:        Intake/Output Summary (Last 24 hours) at 10/24/2020 1731 Last data filed at 10/24/2020 1718 Gross per 24 hour  Intake 2705.92 ml  Output 2650 ml  Net 55.92 ml   Filed Weights   10/24/20 0042  Weight: 91.1 kg    Examination:  General exam: Appears calm and comfortable  Respiratory system: Clear to auscultation anterior lung fields.  No wheezes, no crackles, no rhonchi.[name] Respiratory effort normal. Cardiovascular system: S1 & S2 heard, RRR. No JVD, murmurs, rubs, gallops or clicks. No pedal edema. Gastrointestinal system: Abdomen is soft, distended, hyperactive bowel sounds, nontender to palpation.  No rebound.  No guarding.  Central nervous system: Alert and oriented. No focal neurological deficits. Extremities: Symmetric 5 x 5 power. Skin: No rashes, lesions or ulcers Psychiatry: Judgement and insight appear normal. Mood & affect appropriate.     Data Reviewed: I have personally reviewed following labs and imaging studies  CBC: Recent Labs  Lab 10/19/20 0350 10/23/20 2449 10/23/20 1314 10/24/20 0527  WBC 9.1 13.3* 13.3* 14.1*  NEUTROABS 6.2 11.5*  --   --   HGB 11.5* 12.8* 11.4* 12.1*  HCT 36.3* 40.4 37.5* 38.0*  MCV 87.5 86.9 90.8 87.4  PLT 304 461* 390 409*    Basic Metabolic Panel: Recent Labs  Lab 10/19/20 0350 10/23/20 0730 10/23/20 0825 10/23/20 1314 10/24/20 0527  NA 141 142  --   --  147*  K 3.8 3.1*  --   --  3.6  CL 105 104  --   --  115*  CO2 24 25  --   --  23  GLUCOSE 117* 148*  --   --  131*  BUN 27* 44*  --   --  37*  CREATININE 1.96* 2.13*  --  1.99* 1.92*  CALCIUM 9.7 10.0  --   --  10.2  MG  --   --  2.1  --   --      GFR: Estimated Creatinine Clearance: 55.1 mL/min (A) (by C-G formula based on SCr of 1.92 mg/dL (H)).  Liver Function Tests: Recent Labs  Lab 10/19/20 0350 10/23/20 0730 10/24/20 0527  AST 44* 39 29  ALT 58* 56* 42  ALKPHOS 60 57 52  BILITOT 0.6 0.6 0.6  PROT 7.6 8.3* 7.8  ALBUMIN 2.7* 3.2* 2.9*    CBG: Recent Labs  Lab 10/19/20 1213 10/24/20 0038 10/24/20 0803 10/24/20 1139 10/24/20 1636  GLUCAP 142* 112* 120* 124* 94     Recent Results (from the past 240 hour(s))  Culture, blood (routine x 2)     Status: None (Preliminary result)   Collection Time: 10/23/20  8:15 AM   Specimen: BLOOD  Result Value Ref Range Status   Specimen Description   Final    BLOOD BLOOD LEFT HAND Performed at [facility], [place] [address], [place], [place] [address]    Special Requests   Final    BOTTLES DRAWN AEROBIC ONLY Blood Culture adequate volume Performed at [facility] [address] [place], [place] [address]    Culture   Final    NO GROWTH < 24 HOURS Performed at [facility] [address], [place], [place] [address]    Report Status PENDING  Incomplete  Culture, blood (routine x 2)     Status: None (Preliminary result)   Collection Time: 10/23/20  8:25 AM   Specimen: BLOOD  Result Value Ref Range Status   Specimen Description   Final    BLOOD BLOOD RIGHT WRIST Performed at [facility] [address], [place], [place] [address]    Special Requests   Final    BOTTLES DRAWN AEROBIC ONLY Blood Culture adequate volume Performed at [facility] [address], [place], [place] [address]    Culture   Final    NO GROWTH < 24 HOURS Performed at [facility] [address] [place], [place] [address]    Report Status PENDING  Incomplete  Resp Panel by RT-PCR (Flu A&B, Covid) Nasopharyngeal Swab     Status: None   Collection Time: 10/23/20  8:25 AM   Specimen: Nasopharyngeal  Swab; Nasopharyngeal(NP) swabs in vial transport medium  Result Value Ref Range Status   SARS Coronavirus 2 by RT PCR NEGATIVE NEGATIVE Final    Comment: (NOTE) SARS-CoV-2 target nucleic acids are NOT DETECTED.  The SARS-CoV-2 RNA is generally detectable in upper respiratory specimens during the acute phase of infection. The lowest concentration  of SARS-CoV-2 viral copies this assay can detect is 138 copies/mL. A negative result does not preclude SARS-Cov-2 infection and should not be used as the sole basis for treatment or other patient management decisions. A negative result may occur with  improper specimen collection/handling, submission of specimen other than nasopharyngeal swab, presence of viral mutation(s) within the areas targeted by this assay, and inadequate number of viral copies(<138 copies/mL). A negative result must be combined with clinical observations, patient history, and epidemiological information. The expected result is Negative.  Fact Sheet for Patients:  EntrepreneurPulse.com.au  Fact Sheet for Healthcare Providers:  IncredibleEmployment.be  This test is no t yet approved or cleared by the [place] FDA and  has been authorized for detection and/or diagnosis of SARS-CoV-2 by FDA under an Emergency Use Authorization (EUA). This EUA will remain  in effect (meaning this test can be used) for the duration of the COVID-19 declaration under Section 564(b)(1) of the Act, 21 U.S.C.section 360bbb-3(b)(1), unless the authorization is terminated  or revoked sooner.       Influenza A by PCR NEGATIVE NEGATIVE Final   Influenza B by PCR NEGATIVE NEGATIVE Final    Comment: (NOTE) The Xpert Xpress SARS-CoV-2/FLU/RSV plus assay is intended as an aid in the diagnosis of influenza from Nasopharyngeal swab specimens and should not be used as a sole basis for treatment. Nasal washings and aspirates are unacceptable for Xpert Xpress  SARS-CoV-2/FLU/RSV testing.  Fact Sheet for Patients: EntrepreneurPulse.com.au  Fact Sheet for Healthcare Providers: IncredibleEmployment.be  This test is not yet approved or cleared by the [place] FDA and has been authorized for detection and/or diagnosis of SARS-CoV-2 by FDA under an Emergency Use Authorization (EUA). This EUA will remain in effect (meaning this test can be used) for the duration of the COVID-19 declaration under Section 564(b)(1) of the Act, 21 U.S.C. section 360bbb-3(b)(1), unless the authorization is terminated or revoked.  Performed at [facility], [place] [address], [place], [place] [address]          Radiology Studies: CT ABDOMEN PELVIS WO CONTRAST  Result Date: 10/23/2020 CLINICAL DATA:  Nausea and vomiting. Recent diagnosis of left hip infection EXAM: CT ABDOMEN AND PELVIS WITHOUT CONTRAST TECHNIQUE: Multidetector CT imaging of the abdomen and pelvis was performed following the standard protocol without IV contrast. COMPARISON:  None. FINDINGS: Lower chest:  Atelectasis at the lung bases.  Gynecomastia. Hepatobiliary: No focal liver abnormality.Layering gallbladder calculi. No pericholecystic inflammation or bile duct distention Pancreas: Unremarkable. Spleen: Unremarkable. Adrenals/Urinary Tract: Negative adrenals. Cystic density at the upper pole right kidney measuring up to 4.5 cm. Posteriorly there is vague increased density and accentuated fat stranding is seen around the right upper pole . negative bladder. Stomach/Bowel: Diffuse distention of the colon by gas and fluid levels. No bowel wall thickening or obstructive changes. Vascular/Lymphatic: No acute vascular abnormality. No mass or adenopathy. Reproductive:No pathologic findings. Other: No ascites or pneumoperitoneum. Fatty enlargement of the bilateral inguinal canal. Musculoskeletal: No acute abnormalities. Sacroiliac erosions. Stable  sclerotic lesion in the left ilium. IMPRESSION: 1. Diarrheal state with diffuse gas and fluid distended colon. 2. Cystic density in the upper pole right kidney which may contain hemorrhage or proteinaceous contents posteriorly. There is asymmetric stranding around the right kidney, please correlate for signs of ascending infection. Recommend follow-up of the cyst with either enhanced CT or MRI. 3. Cholelithiasis. Electronically Signed   By: Jorje Guild M.D.   On: 10/23/2020 07:09   DG Chest [facility] [address]  Result Date: 10/23/2020 CLINICAL DATA:  45 year old male with history of fever and shortness of breath. EXAM: PORTABLE CHEST 1 VIEW COMPARISON:  No priors. FINDINGS: Lung volumes are low. Elevation of the right hemidiaphragm. No consolidative airspace disease. No pleural effusions. No pneumothorax. No pulmonary nodule or mass noted. Pulmonary vasculature and the cardiomediastinal silhouette are within normal limits. IMPRESSION: 1. Low lung volumes without radiographic evidence of acute cardiopulmonary disease. 2. Elevation of the right hemidiaphragm. Electronically Signed   By: Vinnie Langton M.D.   On: 10/23/2020 08:27   DG Abd Portable 1V  Result Date: 10/24/2020 CLINICAL DATA:  Nausea and vomiting. EXAM: PORTABLE ABDOMEN - 1 VIEW COMPARISON:  CT abdomen and pelvis 10/23/2020. FINDINGS: The colon is diffusely dilated and air-filled measuring up to 9 cm. Degree of distention has slightly increased. Air seen to the level of the sigmoid colon. There is mild gaseous distension of small bowel. No suspicious calcifications. No obvious free air, but free air is difficult to exclude on supine view. IMPRESSION: 1. Diffuse colonic dilatation has increased in size compared to prior. Findings are concerning for worsening colonic ileus. Continued follow-up recommended. Electronically Signed   By: Ronney Asters M.D.   On: 10/24/2020 16:00        Scheduled Meds:  albuterol  2.5 mg Nebulization BID    amLODipine  10 mg Oral Daily   atorvastatin  40 mg Oral Daily   divalproex  500 mg Oral QHS   enoxaparin (LOVENOX) injection  40 mg Subcutaneous Q24H   gabapentin  300 mg Oral QHS   insulin aspart  0-9 Units Subcutaneous TID WC   lisinopril  40 mg Oral Daily   mouth rinse  15 mL Mouth Rinse BID   potassium chloride  40 mEq Oral Once   ziprasidone  80 mg Oral BID   Continuous Infusions:  ampicillin-sulbactam (UNASYN) IV 3 g (10/24/20 1716)   dextrose 5 % and 0.45 % NaCl with KCl 40 mEq/L     vancomycin 1,500 mg (10/24/20 1303)     LOS: 1 day    Time spent: 35 minutes    Irine Seal, MD [facility]   To contact the attending provider between 7A-7P or the covering provider during after hours 7P-7A, please log into the web site [email or web address] and access using universal [place] password for that web site. If you do not have the password, please call the hospital operator.  10/24/2020, 5:31 PM

## 2020-10-25 ENCOUNTER — Inpatient Hospital Stay (HOSPITAL_COMMUNITY): Payer: Medicaid Other

## 2020-10-25 ENCOUNTER — Inpatient Hospital Stay (HOSPITAL_COMMUNITY): Payer: Medicaid Other | Admitting: Registered Nurse

## 2020-10-25 ENCOUNTER — Encounter (HOSPITAL_COMMUNITY)
Admission: EM | Disposition: A | Discharge status: Home Health | Payer: Self-pay | Source: Home / Self Care | Attending: Family Medicine

## 2020-10-25 ENCOUNTER — Encounter (HOSPITAL_COMMUNITY): Payer: Self-pay | Admitting: Internal Medicine

## 2020-10-25 DIAGNOSIS — K567 Ileus, unspecified: Secondary | ICD-10-CM | POA: Diagnosis not present

## 2020-10-25 DIAGNOSIS — A419 Sepsis, unspecified organism: Secondary | ICD-10-CM | POA: Diagnosis not present

## 2020-10-25 DIAGNOSIS — E663 Overweight: Secondary | ICD-10-CM | POA: Diagnosis present

## 2020-10-25 DIAGNOSIS — M009 Pyogenic arthritis, unspecified: Secondary | ICD-10-CM | POA: Diagnosis not present

## 2020-10-25 DIAGNOSIS — N1832 Chronic kidney disease, stage 3b: Secondary | ICD-10-CM | POA: Diagnosis not present

## 2020-10-25 HISTORY — PX: BOWEL DECOMPRESSION: SHX5532

## 2020-10-25 HISTORY — PX: COLONOSCOPY: SHX5424

## 2020-10-25 LAB — CBC WITH DIFFERENTIAL/PLATELET
Abs Immature Granulocytes: 0.15 10*3/uL — ABNORMAL HIGH (ref 0.00–0.07)
Basophils Absolute: 0.1 10*3/uL (ref 0.0–0.1)
Basophils Relative: 1 %
Eosinophils Absolute: 0.2 10*3/uL (ref 0.0–0.5)
Eosinophils Relative: 2 %
HCT: 37.3 % — ABNORMAL LOW (ref 39.0–52.0)
Hemoglobin: 11.7 g/dL — ABNORMAL LOW (ref 13.0–17.0)
Immature Granulocytes: 1 %
Lymphocytes Relative: 20 %
Lymphs Abs: 2.5 10*3/uL (ref 0.7–4.0)
MCH: 27.7 pg (ref 26.0–34.0)
MCHC: 31.4 g/dL (ref 30.0–36.0)
MCV: 88.2 fL (ref 80.0–100.0)
Monocytes Absolute: 1.1 10*3/uL — ABNORMAL HIGH (ref 0.1–1.0)
Monocytes Relative: 9 %
Neutro Abs: 8.7 10*3/uL — ABNORMAL HIGH (ref 1.7–7.7)
Neutrophils Relative %: 67 %
Platelets: 451 10*3/uL — ABNORMAL HIGH (ref 150–400)
RBC: 4.23 MIL/uL (ref 4.22–5.81)
RDW: 13.8 % (ref 11.5–15.5)
WBC: 12.8 10*3/uL — ABNORMAL HIGH (ref 4.0–10.5)
nRBC: 0 % (ref 0.0–0.2)

## 2020-10-25 LAB — BASIC METABOLIC PANEL
Anion gap: 10 (ref 5–15)
BUN: 33 mg/dL — ABNORMAL HIGH (ref 6–20)
CO2: 24 mmol/L (ref 22–32)
Calcium: 9.4 mg/dL (ref 8.9–10.3)
Chloride: 112 mmol/L — ABNORMAL HIGH (ref 98–111)
Creatinine, Ser: 1.91 mg/dL — ABNORMAL HIGH (ref 0.61–1.24)
GFR, Estimated: 44 mL/min — ABNORMAL LOW (ref >60)
Glucose, Bld: 143 mg/dL — ABNORMAL HIGH (ref 70–99)
Potassium: 3.9 mmol/L (ref 3.5–5.1)
Sodium: 146 mmol/L — ABNORMAL HIGH (ref 135–145)

## 2020-10-25 LAB — GLUCOSE, CAPILLARY
Glucose-Capillary: 127 mg/dL — ABNORMAL HIGH (ref 70–99)
Glucose-Capillary: 106 mg/dL — ABNORMAL HIGH (ref 70–99)
Glucose-Capillary: 85 mg/dL (ref 70–99)
Glucose-Capillary: 94 mg/dL (ref 70–99)

## 2020-10-25 LAB — MAGNESIUM: Magnesium: 2 mg/dL (ref 1.7–2.4)

## 2020-10-25 LAB — PROCALCITONIN: Procalcitonin: <0.1 ng/mL

## 2020-10-25 SURGERY — COLONOSCOPY
Anesthesia: Monitor Anesthesia Care

## 2020-10-25 MED ORDER — PROPOFOL 10 MG/ML IV BOLUS
INTRAVENOUS | Status: DC | PRN
  Administered 2020-10-25: 50 mg via INTRAVENOUS

## 2020-10-25 MED ORDER — TECHNETIUM TO 99M ALBUMIN AGGREGATED
4.4000 | Freq: Once | INTRAVENOUS | Status: AC
  Administered 2020-10-25: 4.4 via INTRAVENOUS

## 2020-10-25 MED ORDER — PROPOFOL 500 MG/50ML IV EMUL
INTRAVENOUS | Status: AC
  Filled 2020-10-25: qty 50

## 2020-10-25 MED ORDER — PROPOFOL 500 MG/50ML IV EMUL
INTRAVENOUS | Status: DC | PRN
  Administered 2020-10-25: 125 ug/kg/min via INTRAVENOUS

## 2020-10-25 MED ORDER — LACTATED RINGERS IV SOLN
INTRAVENOUS | Status: AC | PRN
  Administered 2020-10-25: 1000 mL via INTRAVENOUS

## 2020-10-25 MED ORDER — SODIUM CHLORIDE 0.9 % IV SOLN: INTRAVENOUS | Status: DC

## 2020-10-25 MED ORDER — LIDOCAINE 2% (20 MG/ML) 5 ML SYRINGE
INTRAMUSCULAR | Status: DC | PRN
  Administered 2020-10-25: 80 mg via INTRAVENOUS

## 2020-10-25 NOTE — Anesthesia Postprocedure Evaluation (Signed)
Anesthesia Post Note  [name]  Procedure(s) Performed: COLONOSCOPY BOWEL DECOMPRESSION     Patient location during evaluation: PACU Anesthesia Type: MAC Level of consciousness: awake and alert Pain management: pain level controlled Vital Signs Assessment: post-procedure vital signs reviewed and stable Respiratory status: spontaneous breathing Cardiovascular status: stable Anesthetic complications: no   No notable events documented.  Last Vitals:  Vitals:   10/25/20 1320 10/25/20 1408  BP: 129/79 118/81  Pulse: 93 87  Resp: 18 18  Temp:  36.8 C  SpO2: 94% 96%    Last Pain:  Vitals:   10/25/20 1408  TempSrc: Oral  PainSc:                  Nolon Nations

## 2020-10-25 NOTE — Anesthesia Preprocedure Evaluation (Signed)
Anesthesia Evaluation    Reviewed: Allergy & Precautions, Patient's Chart, lab work & pertinent test results, Unable to perform ROS - Chart review only  Airway Mallampati: II  TM Distance: >3 FB Neck ROM: Full    Dental no notable dental hx.    Pulmonary neg pulmonary ROS, former smoker,    Pulmonary exam normal breath sounds clear to auscultation       Cardiovascular hypertension, Pt. on medications Normal cardiovascular exam Rhythm:Regular Rate:Normal     Neuro/Psych PSYCHIATRIC DISORDERS Bipolar Disorder negative neurological ROS     GI/Hepatic negative GI ROS, Neg liver ROS,   Endo/Other  diabetes  Renal/GU Renal disease     Musculoskeletal  (+) Arthritis ,   Abdominal   Peds  Hematology negative hematology ROS (+)   Anesthesia Other Findings   Reproductive/Obstetrics                             Anesthesia Physical Anesthesia Plan  ASA: 3  Anesthesia Plan: MAC   Post-op Pain Management:    Induction: Intravenous  PONV Risk Score and Plan: 1 and Propofol infusion, TIVA, Treatment may vary due to age or medical condition and Ondansetron  Airway Management Planned:   Additional Equipment: None  Intra-op Plan:   Post-operative Plan:   Informed Consent: I have reviewed the patients History and Physical, chart, labs and discussed the procedure including the risks, benefits and alternatives for the proposed anesthesia with the patient or authorized representative who has indicated his/her understanding and acceptance.     Dental advisory given  Plan Discussed with: CRNA  Anesthesia Plan Comments:         Anesthesia Quick Evaluation

## 2020-10-25 NOTE — Op Note (Signed)
[facility] Patient Name: Kyle Montgomery Procedure Date: 10/25/2020 MRN: [id number] Attending MD: Kerin Salen , MD Date of Birth: 1976-01-20 CSN: [id number] Age: 45 Admit Type: Inpatient Procedure:                Colonoscopy Indications:              This is the patient's first colonoscopy, Abnormal                            abdominal x-ray of the GI tract, Abnormal CT of the                            GI tract, For therapy of megacolon Providers:                Kerin Salen, MD, Norman Clay, RN, Michele Mcalpine                            Technician Referring MD:             [facility] Medicines:                Monitored Anesthesia Care Complications:            No immediate complications. Estimated blood loss:                            Minimal. Estimated Blood Loss:     Estimated blood loss was minimal. Procedure:                Pre-Anesthesia Assessment:                           - Prior to the procedure, a History and Physical                            was performed, and patient medications and                            allergies were reviewed. The patient's tolerance of                            previous anesthesia was also reviewed. The risks                            and benefits of the procedure and the sedation                            options and risks were discussed with the patient.                            All questions were answered, and informed consent                            was obtained. Prior Anticoagulants: The patient has  taken no previous anticoagulant or antiplatelet                            agents. ASA Grade Assessment: III - A patient with                            severe systemic disease. After reviewing the risks                            and benefits, the patient was deemed in                            satisfactory condition to undergo the procedure.                           After obtaining  informed consent, the colonoscope                            was passed under direct vision. Throughout the                            procedure, the patient's blood pressure, pulse, and                            oxygen saturations were monitored continuously. The                            [id number]-HQ190L ([id number]) Olympus colonoscope was                            introduced through the anus and advanced to the the                            hepatic flexure vs cecum. Due to poor prep,                            visualization was very limited. The colonoscopy was                            technically difficult and complex due to poor bowel                            prep. The patient tolerated the procedure well. The                            quality of the bowel preparation was poor. Scope In: 12:20:29 PM Scope Out: 12:46:14 PM Total Procedure Duration: 0 hours 25 minutes 45 seconds  Findings:      The perianal and digital rectal examinations were normal.      The lumen of the colon (entire examined portion) was significantly       dilated.      Extensive amounts of liquid semi-liquid semi-solid stool was found in       the entire colon, interfering with visualization. Lavage  of the area was       performed, resulting in incomplete clearance with continued poor       visualization.      A 9 mm polyp was found in the hepatic flexure vs ascending colon. The       polyp was sessile. Polypectomy was not attempted due to poor endoscopic       visualization.      It was unclear if the scope was advanced up to hepatic flexure vs cecum       due to very poor prep. Further advancement of the scope cause       retroflexion.      Decompression of the entire was attempted and was successful, with       complete decompression achieved. Impression:               - Preparation of the colon was poor.                           - Dilated in the entire examined colon.                           - Stool  in the entire examined colon.                           - One 9 mm polyp at the hepatic flexure. Resection                            not attempted.                           - Decompression of the entire was attempted and was                            successful, with complete decompression achieved.                           - No specimens collected.                           - At the conclusion of the procedure the abdomen                            was soft and non distended. Moderate Sedation:      Patient did not receive moderate sedation for this procedure, but       instead received monitored anesthesia care. Recommendation:           - Perform an upright abdominal x-ray today.                           - NPO. Procedure Code(s):        --- Professional ---                           [phone], 52, Colonoscopy, flexible; with                            decompression (for pathologic  distention) (eg,                            volvulus, megacolon), including placement of                            decompression tube, when performed Diagnosis Code(s):        --- Professional ---                           K59.39, Other megacolon                           K63.5, Polyp of colon                           R93.3, Abnormal findings on diagnostic imaging of                            other parts of digestive tract CPT copyright 2019 [employer]. All rights reserved. The codes documented in this report are preliminary and upon coder review may  be revised to meet current compliance requirements. Kerin Salen, MD 10/25/2020 12:53:13 PM This report has been signed electronically. Number of Addenda: 0

## 2020-10-25 NOTE — Consult Note (Signed)
Referring Provider: [facility] Primary Care Physician:  Andrey Campanile, MD Primary Gastroenterologist:  Gentry Fitz  Reason for Consultation:  Colonic Ileus  HPI: Kyle Montgomery is a 45 y.o. male history of type 2 diabetes, bipolar disorder, CKD 3B, hyperlipidemia presented to the ED with nausea and vomiting.  Patient was admitted previously due to septic arthritis. He was discharged 10/19/20 on Doxy and Augmentin. Patient reports soon after returning home he had profuse nausea and vomiting. Nonbloody bilious vomiting per patient report. States he could not keep anything down, even struggled to consume water. Denied abdominal pain, melena, hematochezia. Denies weight loss.   Denies previous history of a colonoscopy. Denies family history of colon cancer. Denies NSAID use.  States today he has no nausea, vomiting, or abdominal pain. Reports he is not passing gas, but had a bowel movement this morning.   Past Medical History:  Diagnosis Date   Bipolar affective (HCC)    followed by mental health   Gynecomastia 02/07   normal pituitary by MRI   Hyperprolactinemia (HCC) 09/27/04   felt 2/2 lithium and fluphenazine   Hypertension    Renal insufficiency    baseline Cr 2.2 - renal US with increased Echo signal, no nephrology eval, no bx    Past Surgical History:  Procedure Laterality Date   DENTAL SURGERY      Prior to Admission medications   Medication Sig Start Date End Date Taking? Authorizing Provider  acetaminophen (TYLENOL) 325 MG tablet Take 2 tablets (650 mg total) by mouth every 6 (six) hours as needed. Patient taking differently: Take 650 mg by mouth every 6 (six) hours as needed for mild pain, fever or headache. 11/25/17  Yes Theotis Barrio, MD  amLODipine (NORVASC) 10 MG tablet Take 1 tablet (10 mg total) by mouth daily. 02/23/20  Yes Katsadouros, Vasilios, MD  amoxicillin-clavulanate (AUGMENTIN) 875-125 MG tablet Take 1 tablet by mouth every 12 (twelve) hours. 10/19/20  Yes Steffanie Rainwater, MD  atorvastatin (LIPITOR) 40 MG tablet Take 1 tablet (40 mg total) by mouth daily. 02/23/20  Yes Katsadouros, Vasilios, MD  divalproex (DEPAKOTE ER) 500 MG 24 hr tablet Take 1 tablet (500 mg total) by mouth at bedtime. 02/23/20 10/23/20 Yes Katsadouros, Vasilios, MD  doxycycline (VIBRA-TABS) 100 MG tablet Take 1 tablet (100 mg total) by mouth every 12 (twelve) hours. 10/19/20  Yes Steffanie Rainwater, MD  gabapentin (NEURONTIN) 300 MG capsule Take 1 capsule (300 mg total) by mouth at bedtime. 10/19/20 11/18/20 Yes Steffanie Rainwater, MD  lisinopril-hydrochlorothiazide (ZESTORETIC) 20-12.5 MG tablet Take 2 tablets by mouth daily. 02/23/20  Yes Katsadouros, Vasilios, MD  polyethylene glycol (MIRALAX / GLYCOLAX) 17 g packet Take 17 g by mouth daily as needed for mild constipation. 10/19/20  Yes Steffanie Rainwater, MD  ziprasidone (GEODON) 80 MG capsule Take 1 capsule (80 mg total) by mouth 2 (two) times daily. 10/19/20 01/17/21 Yes Amponsah, Flossie Buffy, MD  melatonin 3 MG TABS tablet Take 1 tablet (3 mg total) by mouth at bedtime as needed. 10/19/20 11/18/20  Steffanie Rainwater, MD    Scheduled Meds:  albuterol  2.5 mg Nebulization BID   amLODipine  10 mg Oral Daily   divalproex  500 mg Oral QHS   enoxaparin (LOVENOX) injection  40 mg Subcutaneous Q24H   gabapentin  300 mg Oral QHS   insulin aspart  0-9 Units Subcutaneous TID WC   lisinopril  40 mg Oral Daily   mouth rinse  15 mL Mouth  Rinse BID   ziprasidone  80 mg Oral BID   Continuous Infusions:  ampicillin-sulbactam (UNASYN) IV 3 g (10/25/20 0530)   dextrose 5 % and 0.45 % NaCl with KCl 40 mEq/L 125 mL/hr at 10/24/20 1750   vancomycin Stopped (10/24/20 1506)   PRN Meds:.acetaminophen **OR** acetaminophen, albuterol, HYDROmorphone (DILAUDID) injection, melatonin, metoprolol tartrate, ondansetron **OR** ondansetron (ZOFRAN) IV, oxyCODONE, simethicone  Allergies as of 10/23/2020   (No Known Allergies)    Family History  Problem  Relation Age of Onset   Hyperlipidemia Mother    Hypertension Mother    Atrial fibrillation Mother    Hypertension Brother    Hypertension Maternal Grandmother    Cancer Maternal Grandmother     Social History   Socioeconomic History   Marital status: Single    Spouse name: Not on file   Number of children: Not on file   Years of education: Not on file   Highest education level: Not on file  Occupational History   Not on file  Tobacco Use   Smoking status: Former    Types: Cigarettes    Quit date: 01/22/1983    Years since quitting: 37.7   Smokeless tobacco: Never  Vaping Use   Vaping Use: Never used  Substance and Sexual Activity   Alcohol use: No    Alcohol/week: 0.0 standard drinks   Drug use: No   Sexual activity: Not on file  Other Topics Concern   Not on file  Social History Narrative   Not on file   Social Determinants of Health   Financial Resource Strain: Not on file  Food Insecurity: Not on file  Transportation Needs: Not on file  Physical Activity: Not on file  Stress: Not on file  Social Connections: Not on file  Intimate Partner Violence: Not on file    Review of Systems: Review of Systems  Constitutional:  Negative for chills and fever.  HENT:  Negative for congestion and sore throat.   Eyes:  Negative for pain and redness.  Respiratory:  Negative for cough and shortness of breath.   Cardiovascular:  Negative for chest pain and palpitations.  Gastrointestinal:  Positive for nausea and vomiting. Negative for abdominal pain, blood in stool, constipation, diarrhea, heartburn and melena.  Genitourinary:  Negative for flank pain and hematuria.  Musculoskeletal:  Negative for falls and joint pain.  Skin:  Negative for itching and rash.  Neurological:  Negative for seizures and loss of consciousness.  Psychiatric/Behavioral:  Negative for substance abuse. The patient is not nervous/anxious and does not have insomnia.     Physical Exam:Physical  Exam Constitutional:      General: He is not in acute distress.    Appearance: Normal appearance. He is not ill-appearing.  HENT:     Head: Normocephalic and atraumatic.     Nose: Nose normal. No congestion.     Mouth/Throat:     Mouth: Mucous membranes are moist.     Pharynx: Oropharynx is clear.  Eyes:     Extraocular Movements: Extraocular movements intact.     Conjunctiva/sclera: Conjunctivae normal.  Cardiovascular:     Rate and Rhythm: Normal rate and regular rhythm.  Pulmonary:     Effort: Pulmonary effort is normal. No respiratory distress.  Abdominal:     General: There is distension.     Palpations: There is no mass.     Tenderness: There is no abdominal tenderness. There is no guarding or rebound.     Hernia: No hernia  is present.     Comments: Abdomen distended with distant bowel sounds, nontender.  Musculoskeletal:        General: No swelling. Normal range of motion.     Cervical back: Normal range of motion and neck supple.  Skin:    General: Skin is warm and dry.  Neurological:     General: No focal deficit present.     Mental Status: He is alert and oriented to person, place, and time.  Psychiatric:        Mood and Affect: Mood normal.        Behavior: Behavior normal.        Thought Content: Thought content normal.        Judgment: Judgment normal.    Vital signs: Vitals:   10/25/20 0538 10/25/20 0748  BP: 128/87   Pulse: 84   Resp: 18   Temp: 98.8 F (37.1 C)   SpO2: 94% 94%   Last BM Date: 10/23/20    GI:  Lab Results: Recent Labs    10/23/20 1314 10/24/20 0527 10/25/20 0516  WBC 13.3* 14.1* 12.8*  HGB 11.4* 12.1* 11.7*  HCT 37.5* 38.0* 37.3*  PLT 390 409* 451*   BMET Recent Labs    10/23/20 0730 10/23/20 1314 10/24/20 0527 10/25/20 0516  NA 142  --  147* 146*  K 3.1*  --  3.6 3.9  CL 104  --  115* 112*  CO2 25  --  23 24  GLUCOSE 148*  --  131* 143*  BUN 44*  --  37* 33*  CREATININE 2.13* 1.99* 1.92* 1.91*  CALCIUM 10.0   --  10.2 9.4   LFT Recent Labs    10/24/20 0527  PROT 7.8  ALBUMIN 2.9*  AST 29  ALT 42  ALKPHOS 52  BILITOT 0.6   PT/INR No results for input(s): LABPROT, INR in the last 72 hours.   Studies/Results: DG Chest Port 1 View  Result Date: 10/23/2020 CLINICAL DATA:  45 year old male with history of fever and shortness of breath. EXAM: PORTABLE CHEST 1 VIEW COMPARISON:  No priors. FINDINGS: Lung volumes are low. Elevation of the right hemidiaphragm. No consolidative airspace disease. No pleural effusions. No pneumothorax. No pulmonary nodule or mass noted. Pulmonary vasculature and the cardiomediastinal silhouette are within normal limits. IMPRESSION: 1. Low lung volumes without radiographic evidence of acute cardiopulmonary disease. 2. Elevation of the right hemidiaphragm. Electronically Signed   By: Trudie Reed M.D.   On: 10/23/2020 08:27   DG Abd Portable 1V  Result Date: 10/24/2020 CLINICAL DATA:  Nausea and vomiting. EXAM: PORTABLE ABDOMEN - 1 VIEW COMPARISON:  CT abdomen and pelvis 10/23/2020. FINDINGS: The colon is diffusely dilated and air-filled measuring up to 9 cm. Degree of distention has slightly increased. Air seen to the level of the sigmoid colon. There is mild gaseous distension of small bowel. No suspicious calcifications. No obvious free air, but free air is difficult to exclude on supine view. IMPRESSION: 1. Diffuse colonic dilatation has increased in size compared to prior. Findings are concerning for worsening colonic ileus. Continued follow-up recommended. Electronically Signed   By: Darliss Cheney M.D.   On: 10/24/2020 16:00    Impression: Colonic Ileus - CT ab pelvis wo contrast 10/3: Diarrheal state with diffuse gas and fluid distended colon. Cystic density in the upper pole right kidney which may contain hemorrhage or proteinaceous contents posteriorly. There is asymmetric stranding around the right kidney. Cholelithiasis. - Abdominal xray 10/4:Diffuse colonic  dilatation  has increased in size compared to prior. Findings are concerning for worsening colonic ileus. - Potassium 3.9, Magnesium 2.0 - Leukocytosis 12.8 (improved from 14.1 yesterday)  CKD - BUN 33, Cr 1.91, GFR 44  SIRS  Plan: Continue NPO status until after procedure.  Planning for colonic decompression today. I thoroughly discussed the procedures to include nature, alternatives, benefits, and risks including but not limited to bleeding, perforation, infection, anesthesia/cardiac and pulmonary complications. Patient provides understanding and gave verbal consent to proceed.  Continue to maintain magnesium above 2 and potassium at 4-4.5.   Dulcolax suppository   [facility] will follow.       LOS: 2 days   Zakaree Mcclenahan Leanna Sato  PA-C 10/25/2020, 7:51 AM  Contact #  [phone]

## 2020-10-25 NOTE — Progress Notes (Signed)
PROGRESS NOTE  INMAN FETTIG KKX:[id number] DOB: [birth year] DOA: 10/23/2020 PCP: Andrey Campanile, MD  HPI/Recap of past 47 hours: 45 year old male with past medical history of diabetes mellitus type 2, bipolar disorder and stage IIIb chronic kidney disease who had been recently admitted to the internal medicine residency service for septic arthritis over the left SI joint and discharged home on antibiotics on 9/29 who returned on 10/3 to the emergency room with complaints of nausea/vomiting and abdominal pain.  Patient noted to be hypoxic as well as having a colonic ileus.  Admitted to the hospitalist service and GI consulted.  Patient taken today for decompressive colonoscopy which was reportedly successful.  Patient complains of feeling hungry.  Suspected aspiration pneumonia given hypoxia and patient placed on IV antibiotics.  Patient seen this evening.  He refused MRI due to complaints of pain  Assessment/Plan: Principal Problem:   Ileus [facility]) Colonic: Status post decompressive colonoscopy today.  Reportedly successful.  Keep n.p.o. and follow-up in the morning with repeat abdominal x-ray.  Appreciate GI help. Active Problems:   Bipolar affective (HCC): Stable.  Continue home medications.    Essential hypertension: Blood pressure stable.    CKD (chronic kidney disease), stage III (HCC): At baseline.    Septic arthritis [facility]): Patient refused follow-up MRI due to complaints of pain while being on MRI table.  He declined offer to give IV pain medicine before MRI and was told that it was important  Sepsis from aspiration pneumonia causing acute respiratory failure with hypoxia: Patient meets criteria for sepsis given tachycardia, tachypnea and leukocytosis with pneumonia as source.  Oxygenation much improved and patient now on     Hypokalemia: Replacing as needed.    Overweight (BMI 25.0-29.9): Meets criteria BMI greater than 25   Code Status: Full code  Family Communication:  Left message for family  Disposition Plan: Anticipate discharge in the next 1 to 2 days as long as able to tolerate p.o.  And after MRI results   Consultants: Gastroenterology  Procedures: Status post decompressive colonoscopy  Antimicrobials: IV Unasyn and vancomycin 10/3-present  DVT prophylaxis: Lovenox  Level of care: Telemetry   Objective: Vitals:   10/25/20 1320 10/25/20 1408  BP: 129/79 118/81  Pulse: 93 87  Resp: 18 18  Temp:  98.3 F (36.8 C)  SpO2: 94% 96%    Intake/Output Summary (Last 24 hours) at 10/25/2020 1704 Last data filed at 10/25/2020 1409 Gross per 24 hour  Intake 2529.23 ml  Output 3300 ml  Net -770.77 ml   Filed Weights   10/24/20 0042 10/25/20 1145  Weight: 91.1 kg 91.1 kg   Body mass index is 28.82 kg/m.  Exam:  General: Alert and oriented x2, no acute distress HEENT: Normocephalic, atraumatic, mucous membranes are slightly dry Cardiovascular: Regular rate rhythm, S1-S2 Respiratory: Clear to auscultation bilaterally Abdomen: Soft, nontender, nondistended, positive bowel sounds Musculoskeletal: No clubbing or cyanosis or edema Skin: No skin breaks, tears or lesions Psychiatry: Appropriate, no evidence of acute psychoses Neurology: No focal deficits   Data Reviewed: CBC: Recent Labs  Lab 10/19/20 0350 10/23/20 0633 10/23/20 1314 10/24/20 0527 10/25/20 0516  WBC 9.1 13.3* 13.3* 14.1* 12.8*  NEUTROABS 6.2 11.5*  --   --  8.7*  HGB 11.5* 12.8* 11.4* 12.1* 11.7*  HCT 36.3* 40.4 37.5* 38.0* 37.3*  MCV 87.5 86.9 90.8 87.4 88.2  PLT 304 461* 390 409* 451*   Basic Metabolic Panel: Recent Labs  Lab 10/19/20 0350 10/23/20 0730 10/23/20 0825 10/23/20  1314 10/24/20 0527 10/25/20 0516  NA 141 142  --   --  147* 146*  K 3.8 3.1*  --   --  3.6 3.9  CL 105 104  --   --  115* 112*  CO2 24 25  --   --  23 24  GLUCOSE 117* 148*  --   --  131* 143*  BUN 27* 44*  --   --  37* 33*  CREATININE 1.96* 2.13*  --  1.99* 1.92* 1.91*   CALCIUM 9.7 10.0  --   --  10.2 9.4  MG  --   --  2.1  --   --  2.0   GFR: Estimated Creatinine Clearance: 55.4 mL/min (A) (by C-G formula based on SCr of 1.91 mg/dL (H)). Liver Function Tests: Recent Labs  Lab 10/19/20 0350 10/23/20 0730 10/24/20 0527  AST 44* 39 29  ALT 58* 56* 42  ALKPHOS 60 57 52  BILITOT 0.6 0.6 0.6  PROT 7.6 8.3* 7.8  ALBUMIN 2.7* 3.2* 2.9*   Recent Labs  Lab 10/23/20 0730  LIPASE 66*   No results for input(s): AMMONIA in the last 168 hours. Coagulation Profile: No results for input(s): INR, PROTIME in the last 168 hours. Cardiac Enzymes: No results for input(s): CKTOTAL, CKMB, CKMBINDEX, TROPONINI in the last 168 hours. BNP (last 3 results) No results for input(s): PROBNP in the last 8760 hours. HbA1C: No results for input(s): HGBA1C in the last 72 hours. CBG: Recent Labs  Lab 10/24/20 1139 10/24/20 1636 10/24/20 2220 10/25/20 0801 10/25/20 1205  GLUCAP 124* 94 131* 127* 106*   Lipid Profile: No results for input(s): CHOL, HDL, LDLCALC, TRIG, CHOLHDL, LDLDIRECT in the last 72 hours. Thyroid Function Tests: No results for input(s): TSH, T4TOTAL, FREET4, T3FREE, THYROIDAB in the last 72 hours. Anemia Panel: No results for input(s): VITAMINB12, FOLATE, FERRITIN, TIBC, IRON, RETICCTPCT in the last 72 hours. Urine analysis:    Component Value Date/Time   COLORURINE YELLOW 10/23/2020 0649   APPEARANCEUR CLEAR 10/23/2020 0649   LABSPEC 1.012 10/23/2020 0649   PHURINE 5.0 10/23/2020 0649   GLUCOSEU NEGATIVE 10/23/2020 0649   GLUCOSEU NEG mg/dL 78/29/5621 3086   HGBUR SMALL (A) 10/23/2020 0649   BILIRUBINUR NEGATIVE 10/23/2020 0649   KETONESUR NEGATIVE 10/23/2020 0649   PROTEINUR 100 (A) 10/23/2020 0649   UROBILINOGEN 0.2 01/09/2012 1007   NITRITE NEGATIVE 10/23/2020 0649   LEUKOCYTESUR NEGATIVE 10/23/2020 0649   Sepsis Labs: @LABRCNTIP (procalcitonin:4,lacticidven:4)  ) Recent Results (from the past 240 hour(s))  Culture, blood  (routine x 2)     Status: None (Preliminary result)   Collection Time: 10/23/20  8:15 AM   Specimen: BLOOD  Result Value Ref Range Status   Specimen Description   Final    BLOOD BLOOD LEFT HAND Performed at [facility], [address] [place], [place]    Special Requests   Final    BOTTLES DRAWN AEROBIC ONLY Blood Culture adequate volume Performed at [facility], [address] [place], [place]    Culture   Final    NO GROWTH 2 DAYS Performed at [facility], [address] [place], [place]    Report Status PENDING  Incomplete  Culture, blood (routine x 2)     Status: None (Preliminary result)   Collection Time: 10/23/20  8:25 AM   Specimen: BLOOD  Result Value Ref Range Status   Specimen Description   Final  BLOOD BLOOD RIGHT WRIST Performed at [facility], [address], [place], [place] [address]    Special Requests   Final    BOTTLES DRAWN AEROBIC ONLY Blood Culture adequate volume Performed at [facility], [address], [place], [place] [address]    Culture   Final    NO GROWTH 2 DAYS Performed at [facility], [address], [place], [place] [address]    Report Status PENDING  Incomplete  Resp Panel by RT-PCR (Flu A&B, Covid) Nasopharyngeal Swab     Status: None   Collection Time: 10/23/20  8:25 AM   Specimen: Nasopharyngeal Swab; Nasopharyngeal(NP) swabs in vial transport medium  Result Value Ref Range Status   SARS Coronavirus 2 by RT PCR NEGATIVE NEGATIVE Final    Comment: (NOTE) SARS-CoV-2 target nucleic acids are NOT DETECTED.  The SARS-CoV-2 RNA is generally detectable in upper respiratory specimens during the acute phase of infection. The lowest concentration of SARS-CoV-2 viral copies this assay can detect is 138 copies/mL. A negative result does not preclude SARS-Cov-2 infection and should not be used as the sole basis for  treatment or other patient management decisions. A negative result may occur with  improper specimen collection/handling, submission of specimen other than nasopharyngeal swab, presence of viral mutation(s) within the areas targeted by this assay, and inadequate number of viral copies(<138 copies/mL). A negative result must be combined with clinical observations, patient history, and epidemiological information. The expected result is Negative.  Fact Sheet for Patients:  BloggerCourse.com  Fact Sheet for Healthcare Providers:  SeriousBroker.it  This test is no t yet approved or cleared by the [place] FDA and  has been authorized for detection and/or diagnosis of SARS-CoV-2 by FDA under an Emergency Use Authorization (EUA). This EUA will remain  in effect (meaning this test can be used) for the duration of the COVID-19 declaration under Section 564(b)(1) of the Act, 21 U.S.C.section 360bbb-3(b)(1), unless the authorization is terminated  or revoked sooner.       Influenza A by PCR NEGATIVE NEGATIVE Final   Influenza B by PCR NEGATIVE NEGATIVE Final    Comment: (NOTE) The Xpert Xpress SARS-CoV-2/FLU/RSV plus assay is intended as an aid in the diagnosis of influenza from Nasopharyngeal swab specimens and should not be used as a sole basis for treatment. Nasal washings and aspirates are unacceptable for Xpert Xpress SARS-CoV-2/FLU/RSV testing.  Fact Sheet for Patients: BloggerCourse.com  Fact Sheet for Healthcare Providers: SeriousBroker.it  This test is not yet approved or cleared by the [place] FDA and has been authorized for detection and/or diagnosis of SARS-CoV-2 by FDA under an Emergency Use Authorization (EUA). This EUA will remain in effect (meaning this test can be used) for the duration of the COVID-19 declaration under Section 564(b)(1) of the Act, 21  U.S.C. section 360bbb-3(b)(1), unless the authorization is terminated or revoked.  Performed at [facility], [address] [place], [place] [address]       Studies: DG Abd 1 View  Result Date: 10/25/2020 CLINICAL DATA:  Megacolon/colonic ileus EXAM: ABDOMEN - 1 VIEW COMPARISON:  Radiograph dated October 25, 2020 at 05:05 FINDINGS: Megacolon/colonic ileus cecum measures up to 5.5 cm, previously 10.0 cm. Other areas of the colon image 80 similar to slightly decreased gaseous distension. Scattered gas-filled loops of small bowel which are nondilated. No definite evidence of free air, although evaluation is limited given supine technique. No acute osseous abnormality. IMPRESSION: Decreased gaseous distention of  the colon when compared with prior radiograph. Electronically Signed   By: Allegra Lai M.D.   On: 10/25/2020 13:41   NM Pulmonary Perfusion  Result Date: 10/25/2020 CLINICAL DATA:  PE suspected, high probability. Clinical concern for aspiration pneumonia. Renal insufficiency. EXAM: NUCLEAR MEDICINE PERFUSION LUNG SCAN TECHNIQUE: Perfusion images were obtained in multiple projections after intravenous injection of radiopharmaceutical. Ventilation scans intentionally deferred if perfusion scan and chest x-ray adequate for interpretation during COVID 19 epidemic. RADIOPHARMACEUTICALS:  4.4 mCi Tc-36m MAA IV COMPARISON:  Chest radiographs earlier today and 10/23/2020. Abdominal CT 10/23/2020. FINDINGS: Persistent asymmetric elevation of the right hemidiaphragm with associated patchy hypo ventilation at the right lung base, corresponding with atelectasis on radiographs. No segmental perfusion defects are identified to suggest pulmonary embolism. IMPRESSION: Right basilar atelectasis. No perfusion defects suspicious for pulmonary embolism. Electronically Signed   By: Carey Bullocks M.D.   On: 10/25/2020 13:46   DG CHEST PORT 1 VIEW  Result Date: 10/25/2020 CLINICAL DATA:   Hypoxia EXAM: PORTABLE CHEST 1 VIEW COMPARISON:  Chest x-ray dated October 23, 2020 FINDINGS: Cardiac and mediastinal contours are unchanged and within normal limits. Elevation of the right hemidiaphragm. New right greater than left basilar linear opacities, likely due to atelectasis. No large pleural effusion or evidence of pneumothorax. IMPRESSION: New right greater than left basilar linear opacities, likely due to atelectasis. Electronically Signed   By: Allegra Lai M.D.   On: 10/25/2020 08:40   DG Abd Portable 1V  Result Date: 10/25/2020 CLINICAL DATA:  Ileus, hypoxia EXAM: PORTABLE ABDOMEN - 1 VIEW COMPARISON:  KUB dated 1 day prior FINDINGS: There is marked gaseous distention of the bowel. The cecum measures up to 10.0 cm, similar to the prior study. A loop of bowel in the upper abdomen measures up to 8.9 cm, also not significant changed. There is no definite free air, suboptimally evaluated on this single supine view. IMPRESSION: Overall unchanged significant gaseous distention of the bowel. Electronically Signed   By: Lesia Hausen M.D.   On: 10/25/2020 08:41    Scheduled Meds:  albuterol  2.5 mg Nebulization BID   amLODipine  10 mg Oral Daily   divalproex  500 mg Oral QHS   enoxaparin (LOVENOX) injection  40 mg Subcutaneous Q24H   gabapentin  300 mg Oral QHS   insulin aspart  0-9 Units Subcutaneous TID WC   lisinopril  40 mg Oral Daily   mouth rinse  15 mL Mouth Rinse BID   ziprasidone  80 mg Oral BID    Continuous Infusions:  ampicillin-sulbactam (UNASYN) IV 3 g (10/25/20 1451)   dextrose 5 % and 0.45 % NaCl with KCl 40 mEq/L 125 mL/hr at 10/24/20 1750   vancomycin 1,500 mg (10/25/20 1612)     LOS: 2 days     Hollice Espy, MD [facility]   10/25/2020, 5:04 PM

## 2020-10-25 NOTE — Transfer of Care (Signed)
Immediate Anesthesia Transfer of Care Note  [name]  Procedure(s) Performed: COLONOSCOPY BOWEL DECOMPRESSION  Patient Location: PACU  Anesthesia Type:MAC  Level of Consciousness: sedated  Airway & Oxygen Therapy: Patient Spontanous Breathing and Patient connected to nasal cannula oxygen  Post-op Assessment: Report given to RN and Post -op Vital signs reviewed and stable  Post vital signs: Reviewed and stable  Last Vitals:  Vitals Value Taken Time  BP    Temp    Pulse    Resp    SpO2      Last Pain:  Vitals:   10/25/20 1145  TempSrc: Oral  PainSc: 0-No pain         Complications: No notable events documented.

## 2020-10-26 ENCOUNTER — Inpatient Hospital Stay (HOSPITAL_COMMUNITY): Payer: Medicaid Other

## 2020-10-26 ENCOUNTER — Encounter (HOSPITAL_COMMUNITY): Payer: Self-pay | Admitting: Gastroenterology

## 2020-10-26 DIAGNOSIS — K567 Ileus, unspecified: Secondary | ICD-10-CM | POA: Diagnosis not present

## 2020-10-26 LAB — GLUCOSE, CAPILLARY
Glucose-Capillary: 115 mg/dL — ABNORMAL HIGH (ref 70–99)
Glucose-Capillary: 88 mg/dL (ref 70–99)

## 2020-10-26 MED ORDER — POLYETHYLENE GLYCOL 3350 17 G PO PACK
17.0000 g | PACK | Freq: Two times a day (BID) | ORAL | Status: DC
  Administered 2020-10-26 – 2020-10-28 (×4): 17 g via ORAL
  Filled 2020-10-26 (×5): qty 1

## 2020-10-26 MED ORDER — PSYLLIUM 95 % PO PACK
1.0000 | PACK | Freq: Two times a day (BID) | ORAL | Status: DC
  Administered 2020-10-26 – 2020-10-28 (×3): 1 via ORAL
  Filled 2020-10-26 (×6): qty 1

## 2020-10-26 NOTE — Progress Notes (Signed)
PROGRESS NOTE    Kyle Montgomery  POE:[id number]  DOB: 1975-06-16  DOA: 10/23/2020 PCP: Andrey Campanile, MD Outpatient Specialists:   Hospital course:  45 year old with bipolar disorder, DM2, CKD 3B, septic arthritis recently discharged on 10/19/2020 on Doxy and Augmentin was admitted 10/23/2020 with nausea and vomiting.  X-ray revealed diffuse colonic dilatation rule out toxic megacolon.  Cecum measured 10 cm.  Patient was treated with colonoscopy decompression.   Subjective:   Patient is upset that he has not been able to eat.  Also has refused MRI stating that he has had 6-9 MRIs both at [place] and at [facility] and "they know all they need to know".  Patient states he is very hungry.  Denies abdominal pain.   Objective: Vitals:   10/25/20 2133 10/26/20 0511 10/26/20 0748 10/26/20 1353  BP: 127/86 136/90  128/90  Pulse: 85 88  100  Resp: 17 16  16   Temp: 98.3 F (36.8 C) 98.5 F (36.9 C)  99.2 F (37.3 C)  TempSrc: Oral Oral  Oral  SpO2: 95% 90% 93% 94%  Weight:      Height:        Intake/Output Summary (Last 24 hours) at 10/26/2020 1621 Last data filed at 10/26/2020 1600 Gross per 24 hour  Intake 0 ml  Output 3775 ml  Net -3775 ml   Filed Weights   10/24/20 0042 10/25/20 1145  Weight: 91.1 kg 91.1 kg     Exam:  General: Patient lying in bed in no apparent physical Eyes: sclera anicteric, conjuctiva mild injection bilaterally CVS: S1-S2, regular  Respiratory:  decreased air entry bilaterally secondary to decreased inspiratory effort, rales at bases  GI: NABS, soft, NT  LE: No edema.  Neuro: A/O x 3, Moving all extremities equally with normal strength, CN 3-12 intact, grossly nonfocal.  Psych: Extremely poor insight.  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 Appreciate GI consultation, advance to clear liquid diet although colonic dilation appears to be recurring. Potassium and magnesium have been repleted Avoid narcotics Daily abdominal films, films for tomorrow ordered  Hypoxia Resolved spontaneously Chest x-ray negative for pneumonia VQ scan negative for PE  Septic arthritis of left SI joint Continue Unasyn Patient is refusing MRI which was ordered due to ongoing left hip pain Had been seen by orthopedics/IR at last admission who noted no abscess or fluid collection was seen so no indication for arthrocentesis.  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  Is patient medically stable for Discharge: No   Scheduled Meds:  albuterol  2.5 mg Nebulization BID   amLODipine  10 mg Oral Daily   divalproex  500 mg Oral QHS   enoxaparin (LOVENOX) injection  40 mg Subcutaneous Q24H   gabapentin  300 mg Oral  QHS   insulin aspart  0-9 Units Subcutaneous TID WC   lisinopril  40 mg Oral Daily   mouth rinse  15 mL Mouth Rinse BID   polyethylene glycol  17 g Oral BID   psyllium  1 packet Oral BID   ziprasidone  80 mg Oral BID   Continuous Infusions:  ampicillin-sulbactam (UNASYN) IV 3 g (10/26/20 1152)   dextrose 5 % and 0.45 % NaCl with KCl 40 mEq/L 125 mL/hr at 10/24/20 1750   vancomycin 1,500 mg (10/26/20 1245)    Data Reviewed:  Basic Metabolic Panel: Recent Labs  Lab  10/23/20 0730 10/23/20 0825 10/23/20 1314 10/24/20 0527 10/25/20 0516  NA 142  --   --  147* 146*  K 3.1*  --   --  3.6 3.9  CL 104  --   --  115* 112*  CO2 25  --   --  23 24  GLUCOSE 148*  --   --  131* 143*  BUN 44*  --   --  37* 33*  CREATININE 2.13*  --  1.99* 1.92* 1.91*  CALCIUM 10.0  --   --  10.2 9.4  MG  --  2.1  --   --  2.0    CBC: Recent Labs  Lab 10/23/20 0633 10/23/20 1314 10/24/20 0527 10/25/20 0516  WBC 13.3* 13.3* 14.1* 12.8*  NEUTROABS 11.5*  --   --  8.7*  HGB 12.8* 11.4* 12.1* 11.7*  HCT 40.4 37.5* 38.0* 37.3*  MCV 86.9 90.8 87.4 88.2  PLT 461* 390 409* 451*    Studies: DG Abd 1 View  Result Date: 10/25/2020 CLINICAL DATA:  Megacolon/colonic ileus EXAM: ABDOMEN - 1 VIEW COMPARISON:  Radiograph dated October 25, 2020 at 05:05 FINDINGS: Megacolon/colonic ileus cecum measures up to 5.5 cm, previously 10.0 cm. Other areas of the colon image 80 similar to slightly decreased gaseous distension. Scattered gas-filled loops of small bowel which are nondilated. No definite evidence of free air, although evaluation is limited given supine technique. No acute osseous abnormality. IMPRESSION: Decreased gaseous distention of the colon when compared with prior radiograph. Electronically Signed   By: Allegra Lai M.D.   On: 10/25/2020 13:41   NM Pulmonary Perfusion  Result Date: 10/25/2020 CLINICAL DATA:  PE suspected, high probability. Clinical concern for aspiration pneumonia. Renal insufficiency. EXAM: NUCLEAR MEDICINE PERFUSION LUNG SCAN TECHNIQUE: Perfusion images were obtained in multiple projections after intravenous injection of radiopharmaceutical. Ventilation scans intentionally deferred if perfusion scan and chest x-ray adequate for interpretation during COVID 19 epidemic. RADIOPHARMACEUTICALS:  4.4 mCi Tc-15m MAA IV COMPARISON:  Chest radiographs earlier today and 10/23/2020. Abdominal CT 10/23/2020. FINDINGS: Persistent asymmetric elevation of the right  hemidiaphragm with associated patchy hypo ventilation at the right lung base, corresponding with atelectasis on radiographs. No segmental perfusion defects are identified to suggest pulmonary embolism. IMPRESSION: Right basilar atelectasis. No perfusion defects suspicious for pulmonary embolism. Electronically Signed   By: Carey Bullocks M.D.   On: 10/25/2020 13:46   DG CHEST PORT 1 VIEW  Result Date: 10/25/2020 CLINICAL DATA:  Hypoxia EXAM: PORTABLE CHEST 1 VIEW COMPARISON:  Chest x-ray dated October 23, 2020 FINDINGS: Cardiac and mediastinal contours are unchanged and within normal limits. Elevation of the right hemidiaphragm. New right greater than left basilar linear opacities, likely due to atelectasis. No large pleural effusion or evidence of pneumothorax. IMPRESSION: New right greater than left basilar linear opacities, likely due to atelectasis. Electronically Signed   By: Aliene Altes.D.  On: 10/25/2020 08:40   DG Abd Portable 1V  Result Date: 10/26/2020 CLINICAL DATA:  Ileus EXAM: PORTABLE ABDOMEN - 1 VIEW COMPARISON:  10/25/2020 FINDINGS: Lung volumes are small with mild elevation of the right hemidiaphragm. Mild bibasilar atelectasis or infiltrate. No free intraperitoneal gas. The pelvis is excluded from view. Visualized loops of gas-filled mildly dilated large bowel are unchanged measuring up to 8.5 cm in greatest dimension. IMPRESSION: Stable gas-filled loops of dilated large bowel within the visualized upper abdomen. No free air. Electronically Signed   By: Helyn Numbers M.D.   On: 10/26/2020 15:12   DG Abd Portable 1V  Result Date: 10/25/2020 CLINICAL DATA:  Ileus, hypoxia EXAM: PORTABLE ABDOMEN - 1 VIEW COMPARISON:  KUB dated 1 day prior FINDINGS: There is marked gaseous distention of the bowel. The cecum measures up to 10.0 cm, similar to the prior study. A loop of bowel in the upper abdomen measures up to 8.9 cm, also not significant changed. There is no definite free air,  suboptimally evaluated on this single supine view. IMPRESSION: Overall unchanged significant gaseous distention of the bowel. Electronically Signed   By: Lesia Hausen M.D.   On: 10/25/2020 08:41    Principal Problem:   [facility]): Colonic Active Problems:   Bipolar affective (HCC)   Essential hypertension   CKD (chronic kidney disease), stage III (HCC)   Hyperlipidemia   Septic arthritis (HCC)   SIRS (systemic inflammatory response syndrome) (HCC)   Hypoxia   Hypokalemia   Overweight (BMI 25.0-29.9)     Lexie Koehl Orma Flaming, [facility]  If 7PM-7AM, please contact night-coverage [email or web address]   LOS: 3 days

## 2020-10-26 NOTE — Progress Notes (Signed)
[facility] Gastroenterology Progress Note  Kyle Montgomery 45 y.o. 30-Sep-1975  CC:  Colonic Distention   Subjective: Patient states he is doing well today. Denies abdominal pain, nausea, vomiting. Last BM was yesterday morning. Passing flatus regularly.  ROS : Review of Systems  Cardiovascular:  Negative for chest pain and palpitations.  Gastrointestinal:  Negative for abdominal pain, blood in stool, constipation, diarrhea, heartburn, melena, nausea and vomiting.     Objective: Vital signs in last 24 hours: Vitals:   10/26/20 0511 10/26/20 0748  BP: 136/90   Pulse: 88   Resp: 16   Temp: 98.5 F (36.9 C)   SpO2: 90% 93%    Physical Exam:  General:  Alert, cooperative, no distress, appears stated age  Head:  Normocephalic, without obvious abnormality, atraumatic  Eyes:  Anicteric sclera, EOM's intact  Lungs:   Clear to auscultation bilaterally, respirations unlabored  Heart:  Regular rate and rhythm, S1, S2 normal  Abdomen:   Soft, non-tender, bowel sounds active all four quadrants, much improved from yesterday     Lab Results: Recent Labs    10/24/20 0527 10/25/20 0516  NA 147* 146*  K 3.6 3.9  CL 115* 112*  CO2 23 24  GLUCOSE 131* 143*  BUN 37* 33*  CREATININE 1.92* 1.91*  CALCIUM 10.2 9.4  MG  --  2.0   Recent Labs    10/24/20 0527  AST 29  ALT 42  ALKPHOS 52  BILITOT 0.6  PROT 7.8  ALBUMIN 2.9*   Recent Labs    10/24/20 0527 10/25/20 0516  WBC 14.1* 12.8*  NEUTROABS  --  8.7*  HGB 12.1* 11.7*  HCT 38.0* 37.3*  MCV 87.4 88.2  PLT 409* 451*   No results for input(s): LABPROT, INR in the last 72 hours.    Assessment Colonic Distention: s/p colonic decompression -CT ab pelvis wo contrast 10/3: Diarrheal state with diffuse gas and fluid distended colon. Cystic density in the upper pole right kidney which may contain hemorrhage or proteinaceous contents posteriorly. There is asymmetric stranding around the right kidney. Cholelithiasis. - Repeat  abdominal xray 10/5:Decreased gaseous distention of the colon when compared with prior radiograph. - 10/6: Decompression of the entire was attempted and was successful, with complete decompression achieved - Potassium 3.9 - Magnesium 2.0   Plan: Repeat abdominal xray from today still pending. Overall improvement both clinically and on imaging.  Continue to maintain magnesium above 2 and potassium at 4-4.5.   Continue supportive care and encourage mobilization.  [facility] will follow  Legrand Como PA-C 10/26/2020, 12:33 PM  Contact #  [phone]

## 2020-10-26 NOTE — Care Plan (Signed)
Pt has refused MRI two days in a row. Last night when attempted to get pt. He stated he did not want the test. MRI order is being canceled. MRI will be happy to assist pt with his scan if he decides to try it at a later day. A new MRI order will need to be placed if he changes his mind.

## 2020-10-27 ENCOUNTER — Inpatient Hospital Stay (HOSPITAL_COMMUNITY): Payer: Medicaid Other

## 2020-10-27 DIAGNOSIS — K567 Ileus, unspecified: Secondary | ICD-10-CM | POA: Diagnosis not present

## 2020-10-27 LAB — BASIC METABOLIC PANEL
Anion gap: 8 (ref 5–15)
BUN: 32 mg/dL — ABNORMAL HIGH (ref 6–20)
CO2: 21 mmol/L — ABNORMAL LOW (ref 22–32)
Calcium: 9.4 mg/dL (ref 8.9–10.3)
Chloride: 115 mmol/L — ABNORMAL HIGH (ref 98–111)
Creatinine, Ser: 1.7 mg/dL — ABNORMAL HIGH (ref 0.61–1.24)
GFR, Estimated: 50 mL/min — ABNORMAL LOW (ref >60)
Glucose, Bld: 115 mg/dL — ABNORMAL HIGH (ref 70–99)
Potassium: 3.8 mmol/L (ref 3.5–5.1)
Sodium: 144 mmol/L (ref 135–145)

## 2020-10-27 LAB — VANCOMYCIN, TROUGH: Vancomycin Tr: 23 ug/mL (ref 15–20)

## 2020-10-27 LAB — GLUCOSE, CAPILLARY
Glucose-Capillary: 192 mg/dL — ABNORMAL HIGH (ref 70–99)
Glucose-Capillary: 69 mg/dL — ABNORMAL LOW (ref 70–99)
Glucose-Capillary: 106 mg/dL — ABNORMAL HIGH (ref 70–99)
Glucose-Capillary: 102 mg/dL — ABNORMAL HIGH (ref 70–99)
Glucose-Capillary: 113 mg/dL — ABNORMAL HIGH (ref 70–99)
Glucose-Capillary: 93 mg/dL (ref 70–99)
Glucose-Capillary: 90 mg/dL (ref 70–99)

## 2020-10-27 MED ORDER — POTASSIUM CHLORIDE 20 MEQ PO PACK
20.0000 meq | PACK | Freq: Once | ORAL | Status: AC
  Administered 2020-10-27: 20 meq via ORAL
  Filled 2020-10-27: qty 1

## 2020-10-27 MED ORDER — VANCOMYCIN HCL IN DEXTROSE 1-5 GM/200ML-% IV SOLN
1000.0000 mg | INTRAVENOUS | Status: DC
  Administered 2020-10-28 – 2020-10-29 (×2): 1000 mg via INTRAVENOUS
  Filled 2020-10-27 (×2): qty 200

## 2020-10-27 MED ORDER — VANCOMYCIN HCL IN DEXTROSE 1-5 GM/200ML-% IV SOLN: 1000.0000 mg | INTRAVENOUS | Status: DC

## 2020-10-27 NOTE — Progress Notes (Addendum)
PROGRESS NOTE  Kyle Montgomery MPN:[id number] DOB: Apr 28, 1975 DOA: 10/23/2020 PCP: Andrey Campanile, MD  HPI/Recap of past 61 hours: 45 year old male with bipolar disorder type 2 diabetes mellitus chronic kidney disease stage IIIb, septic arthritis recently discharged on October 19, 2020 on doxycycline and Augmentin but was readmitted October 3 with nausea vomiting x-ray revealed diffuse colonic dilatation rule out toxic megacolon.  Patient was treated with colonic decompression   Subjective: October 27, 2020: Patient seen and examined at bedside denies any new complaint.  Overnight he had a low blood sugar of 69 that was treated by covering ninth provider  Assessment/Plan: Principal Problem:   [facility]): Colonic Active Problems:   Bipolar affective (HCC)   Essential hypertension   CKD (chronic kidney disease), stage III (HCC)   Hyperlipidemia   Septic arthritis (HCC)   SIRS (systemic inflammatory response syndrome) (HCC)   Hypoxia   Hypokalemia   Overweight (BMI 25.0-29.9)  1.  Colonic dilatation up to 10 cm with megacolon Status post colonic decompression Doing much better Continue to monitor and avoid narcotics Today's abdominal x-ray shows Prominent diffuse gaseous distention of the visualized colon, most prominent in the cecum measuring approximately 10.1 cm diameter, increased from 8.5 cm.  2.  Septic arthritis of the left SI joint continue Unasyn Patient stated to have refused MRI  3.  Hypoxia resolved spontaneously VQ scan was negative for PE  4.  Chronic kidney disease stage III. His creatinine today is 1.7  Code Status: Full  Severity of Illness: The appropriate patient status for this patient is INPATIENT. Inpatient status is judged to be reasonable and necessary in order to provide the required intensity of service to ensure the patient's safety. The patient's presenting symptoms, physical exam findings, and initial radiographic and laboratory data in  the context of their chronic comorbidities is felt to place them at high risk for further clinical deterioration. Furthermore, it is not anticipated that the patient will be medically stable for discharge from the hospital within 2 midnights of admission. The following factors support the patient status of inpatient.   " Not medically ready and IV Unasyn   * I certify that at the point of admission it is my clinical judgment that the patient will require inpatient hospital care spanning beyond 2 midnights from the point of admission due to high intensity of service, high risk for further deterioration and high frequency of surveillance required.*   Family Communication: Patient  Disposition Plan: To be determined Status is: Inpatient   Dispo: The patient is from: Home              Anticipated d/c is to:               Anticipated d/c date is:               Patient currently not medically stable for discharge  Consultants: Gastroenterology  Procedures: Colonic decompression  Antimicrobials: Unasyn  DVT prophylaxis: Lovenox   Objective: Vitals:   10/26/20 1353 10/26/20 2025 10/26/20 2105 10/27/20 0403  BP: 128/90 135/88  (!) 133/92  Pulse: 100 94  91  Resp: 16 18  18   Temp: 99.2 F (37.3 C) 98.6 F (37 C)  98.8 F (37.1 C)  TempSrc: Oral Oral  Oral  SpO2: 94% 97% 97% 95%  Weight:      Height:        Intake/Output Summary (Last 24 hours) at 10/27/2020 12/27/2020 Last data filed at 10/27/2020 0305 Gross  per 24 hour  Intake 480 ml  Output 2000 ml  Net -1520 ml   Filed Weights   10/24/20 0042 10/25/20 1145  Weight: 91.1 kg 91.1 kg   Body mass index is 28.82 kg/m.  Exam:  General: 45 y.o. year-old male well developed well nourished in no acute distress.  Alert and oriented x3. Cardiovascular: Regular rate and rhythm with no rubs or gallops.  No thyromegaly or JVD noted.   Respiratory: Clear to auscultation with no wheezes or rales. Good inspiratory effort. Abdomen:  Soft nontender nondistended with normal bowel sounds x4 quadrants. Musculoskeletal: No lower extremity edema. 2/4 pulses in all 4 extremities. Skin: No ulcerative lesions noted or rashes, Psychiatry: Mood is appropriate for condition and setting Neurology:    Data Reviewed: CBC: Recent Labs  Lab 10/23/20 0633 10/23/20 1314 10/24/20 0527 10/25/20 0516  WBC 13.3* 13.3* 14.1* 12.8*  NEUTROABS 11.5*  --   --  8.7*  HGB 12.8* 11.4* 12.1* 11.7*  HCT 40.4 37.5* 38.0* 37.3*  MCV 86.9 90.8 87.4 88.2  PLT 461* 390 409* 451*   Basic Metabolic Panel: Recent Labs  Lab 10/23/20 0730 10/23/20 0825 10/23/20 1314 10/24/20 0527 10/25/20 0516 10/27/20 0529  NA 142  --   --  147* 146* 144  K 3.1*  --   --  3.6 3.9 3.8  CL 104  --   --  115* 112* 115*  CO2 25  --   --  23 24 21*  GLUCOSE 148*  --   --  131* 143* 115*  BUN 44*  --   --  37* 33* 32*  CREATININE 2.13*  --  1.99* 1.92* 1.91* 1.70*  CALCIUM 10.0  --   --  10.2 9.4 9.4  MG  --  2.1  --   --  2.0  --    GFR: Estimated Creatinine Clearance: 62.2 mL/min (A) (by C-G formula based on SCr of 1.7 mg/dL (H)). Liver Function Tests: Recent Labs  Lab 10/23/20 0730 10/24/20 0527  AST 39 29  ALT 56* 42  ALKPHOS 57 52  BILITOT 0.6 0.6  PROT 8.3* 7.8  ALBUMIN 3.2* 2.9*   Recent Labs  Lab 10/23/20 0730  LIPASE 66*   No results for input(s): AMMONIA in the last 168 hours. Coagulation Profile: No results for input(s): INR, PROTIME in the last 168 hours. Cardiac Enzymes: No results for input(s): CKTOTAL, CKMB, CKMBINDEX, TROPONINI in the last 168 hours. BNP (last 3 results) No results for input(s): PROBNP in the last 8760 hours. HbA1C: No results for input(s): HGBA1C in the last 72 hours. CBG: Recent Labs  Lab 10/25/20 1811 10/25/20 2132 10/26/20 0734 10/26/20 1141 10/27/20 0752  GLUCAP 85 94 115* 88 113*   Lipid Profile: No results for input(s): CHOL, HDL, LDLCALC, TRIG, CHOLHDL, LDLDIRECT in the last 72  hours. Thyroid Function Tests: No results for input(s): TSH, T4TOTAL, FREET4, T3FREE, THYROIDAB in the last 72 hours. Anemia Panel: No results for input(s): VITAMINB12, FOLATE, FERRITIN, TIBC, IRON, RETICCTPCT in the last 72 hours. Urine analysis:    Component Value Date/Time   COLORURINE YELLOW 10/23/2020 0649   APPEARANCEUR CLEAR 10/23/2020 0649   LABSPEC 1.012 10/23/2020 0649   PHURINE 5.0 10/23/2020 0649   GLUCOSEU NEGATIVE 10/23/2020 0649   GLUCOSEU NEG mg/dL 62/69/4854 6270   HGBUR SMALL (A) 10/23/2020 0649   BILIRUBINUR NEGATIVE 10/23/2020 0649   KETONESUR NEGATIVE 10/23/2020 0649   PROTEINUR 100 (A) 10/23/2020 0649   UROBILINOGEN 0.2 01/09/2012 1007  NITRITE NEGATIVE 10/23/2020 0649   LEUKOCYTESUR NEGATIVE 10/23/2020 0649   Sepsis Labs: @LABRCNTIP (procalcitonin:4,lacticidven:4)  ) Recent Results (from the past 240 hour(s))  Culture, blood (routine x 2)     Status: None (Preliminary result)   Collection Time: 10/23/20  8:15 AM   Specimen: BLOOD  Result Value Ref Range Status   Specimen Description   Final    BLOOD BLOOD LEFT HAND Performed at [facility], [address], [place], [place]    Special Requests   Final    BOTTLES DRAWN AEROBIC ONLY Blood Culture adequate volume Performed at [facility], [address] [place], [place]    Culture   Final    NO GROWTH 3 DAYS Performed at [facility], [address] [place], [place]    Report Status PENDING  Incomplete  Culture, blood (routine x 2)     Status: None (Preliminary result)   Collection Time: 10/23/20  8:25 AM   Specimen: BLOOD  Result Value Ref Range Status   Specimen Description   Final    BLOOD BLOOD RIGHT WRIST Performed at [facility], [address] [place], [place]    Special Requests   Final    BOTTLES DRAWN AEROBIC ONLY Blood Culture adequate volume Performed at [facility], [address], [place], [place]    Culture   Final    NO GROWTH 3 DAYS Performed at [facility], [address] [place], [place]    Report Status PENDING  Incomplete  Resp Panel by RT-PCR (Flu A&B, Covid) Nasopharyngeal Swab     Status: None   Collection Time: 10/23/20  8:25 AM   Specimen: Nasopharyngeal Swab; Nasopharyngeal(NP) swabs in vial transport medium  Result Value Ref Range Status   SARS Coronavirus 2 by RT PCR NEGATIVE NEGATIVE Final    Comment: (NOTE) SARS-CoV-2 target nucleic acids are NOT DETECTED.  The SARS-CoV-2 RNA is generally detectable in upper respiratory specimens during the acute phase of infection. The lowest concentration of SARS-CoV-2 viral copies this assay can detect is 138 copies/mL. A negative result does not preclude SARS-Cov-2 infection and should not be used as the sole basis for treatment or other patient management decisions. A negative result may occur with  improper specimen collection/handling, submission of specimen other than nasopharyngeal swab, presence of viral mutation(s) within the areas targeted by this assay, and inadequate number of viral copies(<138 copies/mL). A negative result must be combined with clinical observations, patient history, and epidemiological information. The expected result is Negative.  Fact Sheet for Patients:  12/23/20  Fact Sheet for Healthcare Providers:  BloggerCourse.com  This test is no t yet approved or cleared by the [employer] FDA and  has been authorized for detection and/or diagnosis of SARS-CoV-2 by FDA under an Emergency Use Authorization (EUA). This EUA will remain  in effect (meaning this test can be used) for the duration of the COVID-19 declaration under Section 564(b)(1) of the Act, 21 U.S.C.section 360bbb-3(b)(1), unless the authorization is terminated  or revoked sooner.       Influenza A  by PCR NEGATIVE NEGATIVE Final   Influenza B by PCR NEGATIVE NEGATIVE Final    Comment: (NOTE) The Xpert Xpress SARS-CoV-2/FLU/RSV plus assay is intended as an aid in the diagnosis of influenza from Nasopharyngeal swab specimens and should not be used as a sole basis for treatment. Nasal washings and aspirates  are unacceptable for Xpert Xpress SARS-CoV-2/FLU/RSV testing.  Fact Sheet for Patients: BloggerCourse.com  Fact Sheet for Healthcare Providers: SeriousBroker.it  This test is not yet approved or cleared by the [place] FDA and has been authorized for detection and/or diagnosis of SARS-CoV-2 by FDA under an Emergency Use Authorization (EUA). This EUA will remain in effect (meaning this test can be used) for the duration of the COVID-19 declaration under Section 564(b)(1) of the Act, 21 U.S.C. section 360bbb-3(b)(1), unless the authorization is terminated or revoked.  Performed at [facility], [address] [place], [place] [address]       Studies: DG Abd Portable 1V  Result Date: 10/26/2020 CLINICAL DATA:  Ileus EXAM: PORTABLE ABDOMEN - 1 VIEW COMPARISON:  10/25/2020 FINDINGS: Lung volumes are small with mild elevation of the right hemidiaphragm. Mild bibasilar atelectasis or infiltrate. No free intraperitoneal gas. The pelvis is excluded from view. Visualized loops of gas-filled mildly dilated large bowel are unchanged measuring up to 8.5 cm in greatest dimension. IMPRESSION: Stable gas-filled loops of dilated large bowel within the visualized upper abdomen. No free air. Electronically Signed   By: Helyn Numbers M.D.   On: 10/26/2020 15:12    Scheduled Meds:  amLODipine  10 mg Oral Daily   divalproex  500 mg Oral QHS   enoxaparin (LOVENOX) injection  40 mg Subcutaneous Q24H   gabapentin  300 mg Oral QHS   insulin aspart  0-9 Units Subcutaneous TID WC   lisinopril  40 mg Oral Daily   mouth rinse   15 mL Mouth Rinse BID   polyethylene glycol  17 g Oral BID   psyllium  1 packet Oral BID   ziprasidone  80 mg Oral BID    Continuous Infusions:  ampicillin-sulbactam (UNASYN) IV 3 g (10/27/20 0216)   dextrose 5 % and 0.45 % NaCl with KCl 40 mEq/L 125 mL/hr at 10/24/20 1750   vancomycin 1,500 mg (10/26/20 1245)     LOS: 4 days     Myrtie Neither, MD [facility]  To reach me or the doctor on call, go to: [email or web address] Password [email or web address]  10/27/2020, 9:07 AM

## 2020-10-27 NOTE — Progress Notes (Signed)
Inpatient Diabetes Program Recommendations  AACE/ADA: New Consensus Statement on Inpatient Glycemic Control (2015)  Target Ranges:  Prepandial:   less than 140 mg/dL      Peak postprandial:   less than 180 mg/dL (1-2 hours)      Critically ill patients:  140 - 180 mg/dL   Lab Results  Component Value Date   GLUCAP 113 (H) 10/27/2020   HGBA1C 5.9 (H) 10/14/2020    Review of Glycemic Control Results for Kyle Montgomery, Kyle Montgomery (MRN [id number]) as of 10/27/2020 09:39  Ref. Range 10/26/2020 07:34 10/26/2020 11:41 10/26/2020 16:53 10/26/2020 20:56 10/26/2020 21:36 10/27/2020 02:17 10/27/2020 07:52  Glucose-Capillary Latest Ref Range: 70 - 99 mg/dL 324 (H) 88 401 (H)  Novolog 2 units given 69 (L) 106 (H) 102 (H) 113 (H)   ileus Diabetes history: Dm 2 Outpatient Diabetes medications: no meds at home Current orders for Inpatient glycemic control:  Novolog 0-9 units tid  A1c 5.9% on 9/24  Inpatient Diabetes Program Recommendations:    Hypoglycemia 69 yesterday after Novolog dose of 2 units for glucose of 192.  - reduce Novolog Correction to "very Sensitive" 0-6 units tid starting at 151  Thanks,  Christena Deem RN, MSN, BC-ADM Inpatient Diabetes Coordinator Team Pager [phone] (8a-5p)

## 2020-10-27 NOTE — Progress Notes (Signed)
Pharmacy Note:  See full note by Len Childs, PharmD for details. 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  Vancomycin trough level = 23 mcg/mL, which is supratherapeutic. Level drawn ~ 24 hours after last dose. Confirmed with RN that level was collected prior to today's dose being given.    Plan: Decrease Vancomycin to 1g IV q24h Continue to monitor renal function, cultures, clinical course May repeat Vancomycin levels at new steady state, as indicated   Greer Pickerel, PharmD, BCPS Clinical Pharmacist  10/27/2020 3:47 PM

## 2020-10-27 NOTE — Progress Notes (Addendum)
[facility] Progress Note  Kyle Montgomery 45 y.o. [birth year]  CC:  Colonic Distention   Subjective: Patient states he is "doing well" today. Had a BM this morning. Denies nausea, vomiting, abdominal pain. Has been tolerating water well. Feels he is ready for clear liquids.  ROS : Review of Systems  Cardiovascular:  Negative for chest pain and palpitations.  Gastrointestinal:  Negative for abdominal pain, blood in stool, constipation, diarrhea, heartburn, melena, nausea and vomiting.     Objective: Vital signs in last 24 hours: Vitals:   10/26/20 2105 10/27/20 0403  BP:  (!) 133/92  Pulse:  91  Resp:  18  Temp:  98.8 F (37.1 C)  SpO2: 97% 95%    Physical Exam:  General:  Alert, cooperative, no distress, appears stated age  Head:  Normocephalic, without obvious abnormality, atraumatic  Eyes:  Anicteric sclera, EOM's intact  Lungs:   Clear to auscultation bilaterally, respirations unlabored  Heart:  Regular rate and rhythm, S1, S2 normal  Abdomen:   Slightly distended abdomen with distant bowel sounds present. No tenderness, no rebound tenderness     Lab Results: Recent Labs    10/25/20 0516 10/27/20 0529  NA 146* 144  K 3.9 3.8  CL 112* 115*  CO2 24 21*  GLUCOSE 143* 115*  BUN 33* 32*  CREATININE 1.91* 1.70*  CALCIUM 9.4 9.4  MG 2.0  --    No results for input(s): AST, ALT, ALKPHOS, BILITOT, PROT, ALBUMIN in the last 72 hours. Recent Labs    10/25/20 0516  WBC 12.8*  NEUTROABS 8.7*  HGB 11.7*  HCT 37.3*  MCV 88.2  PLT 451*   No results for input(s): LABPROT, INR in the last 72 hours.    Assessment Colonic Distention: s/p colonic decompression -CT ab pelvis wo contrast 10/3: Diarrheal state with diffuse gas and fluid distended colon. Cystic density in the upper pole right kidney which may contain hemorrhage or proteinaceous contents posteriorly. There is asymmetric stranding around the right kidney. Cholelithiasis. - 10/6: Decompression of  the entire was attempted and was successful, with complete decompression achieved - F/u Xray showed improvement - Xray 10/6: Stable gas-filled loops of dilated large bowel within the visualized upper abdomen. No free air. Measuring up to 8.5cm in greatest dimension - Potassium 3.8 - Magnesium 2.0   Plan: Continue daily abdominal films.   Continue to maintain magnesium above 2 and potassium at 4-4.5.    Continue supportive care and encourage mobilization.  May need to consider continuing bowel rest with possible recurrence of colonic distention per abdominal xray 10/6. Awaiting to obtain today's abdominal film   [facility] will follow  Legrand Como PA-C 10/27/2020, 8:33 AM  Contact #  [phone]

## 2020-10-27 NOTE — Progress Notes (Signed)
Pharmacy Antibiotic Note  Kyle Montgomery is a 45 y.o. male admitted on 10/23/2020 with hip pain & emesis.  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 On augmentin & Doxycycline PTA.  10/27/2020 Day #5 Unasyn & vancomycin.  Afebrile. WBC 12.8 on 10/5. SCr down to 1.7, hx CKD3B He is taking PO medications, clear liquid diet ordered yesterday- d/w GI MD> concerned for absorption w/ ileus  Plan: Continue Unasyn 3 g IV q6h  Check vancomycin trough today at 1230 prior to 130 dose Continue vancomycin 1500 mg IV every 24 hours (Goal AUC 400-550,  Monitor clinical course, renal function, cultures as available   Height: 5\' 10"  (177.8 cm) Weight: 91.1 kg (200 lb 13.4 oz) IBW/kg (Calculated) : 73  Temp (24hrs), Avg:98.9 F (37.2 C), Min:98.6 F (37 C), Max:99.2 F (37.3 C)  Recent Labs  Lab 10/23/20 0633 10/23/20 0730 10/23/20 0825 10/23/20 1035 10/23/20 1314 10/24/20 0527 10/25/20 0516 10/27/20 0529  WBC 13.3*  --   --   --  13.3* 14.1* 12.8*  --   CREATININE  --  2.13*  --   --  1.99* 1.92* 1.91* 1.70*  LATICACIDVEN  --   --  1.4 1.2  --   --   --   --      Estimated Creatinine Clearance: 62.2 mL/min (A) (by C-G formula based on SCr of 1.7 mg/dL (H)).    No Known Allergies  Antimicrobials this admission: 10/3 Unasyn >>  10/3 vancomycin >>  Dose adjustments this admission: 10/4 Increase vancomycin to 1500 mg IV every 24 hours 10/7 VT  Microbiology results: 10/3 BCx: pending    Thank you for allowing pharmacy to be a part of this patient's care.  12/7, Pharm.D [phone] 10/27/2020 3:04 PM

## 2020-10-27 NOTE — TOC Initial Note (Signed)
Transition of Care [facility]) - Initial/Assessment Note    Patient Details  Name: DAISUKE BAILEY MRN: [id number] Date of Birth: May 31, 1975  [facility]) CM/SW Contact:    Tyshawna Alarid, Meriam Sprague, RN Phone Number: 10/27/2020, 1:00 PM  Clinical Narrative:                 Pt from home with parents. [facility] was about to start home health services when pt came back to the hospital. Will need new orders if needs HH at dc.   Expected Discharge Plan: Home w Home Health Services Barriers to Discharge: Continued Medical Work up   Expected Discharge Plan and Services Expected Discharge Plan: Home w Home Health Services   Discharge Planning Services: CM Consult   Living arrangements for the past 2 months: Single Family Home Expected Discharge Date:  (unknown)                   Prior Living Arrangements/Services Living arrangements for the past 2 months: Single Family Home Lives with:: Parents          Need for Family Participation in Patient Care: Yes (Comment) Care giver support system in place?: Yes (comment)      Activities of Daily Living Home Assistive Devices/Equipment: Bedside commode/3-in-1, Walker (specify type), Other (Comment), Wheelchair, Crutches (front wheeled walker, tub/shower stool) ADL Screening (condition at time of admission) Patient's cognitive ability adequate to safely complete daily activities?: Yes Is the patient deaf or have difficulty hearing?: No Does the patient have difficulty seeing, even when wearing glasses/contacts?: No Does the patient have difficulty concentrating, remembering, or making decisions?: Yes Patient able to express need for assistance with ADLs?: Yes Does the patient have difficulty dressing or bathing?: No Independently performs ADLs?: No (patient reports that he can't walk or move) Communication: Independent Dressing (OT): Independent Grooming: Independent Feeding: Independent Bathing: Needs assistance Is this a change from  baseline?: Pre-admission baseline Toileting: Dependent Is this a change from baseline?: Change from baseline, expected to last >3days In/Out Bed: Dependent Is this a change from baseline?: Change from baseline, expected to last >3 days Walks in Home: Dependent Is this a change from baseline?: Change from baseline, expected to last >3 days Does the patient have difficulty walking or climbing stairs?: Yes (secondary to left hip issues) Weakness of Legs: Left Weakness of Arms/Hands: None  Permission Sought/Granted                  Emotional Assessment         Alcohol / Substance Use: Not Applicable Psych Involvement: No (comment)  Admission diagnosis:  SIRS (systemic inflammatory response syndrome) (HCC) [R65.10] Nausea and vomiting, unspecified vomiting type [R11.2] Patient Active Problem List   Diagnosis Date Noted   Overweight (BMI 25.0-29.9) 10/25/2020   Ileus (HCC): Colonic 10/24/2020   Hypoxia 10/24/2020   Hypokalemia 10/24/2020   SIRS (systemic inflammatory response syndrome) (HCC) 10/23/2020   Septic arthritis (HCC) 10/14/2020   Left hip pain 10/13/2020   Type 2 diabetes mellitus treated without insulin (HCC) 04/15/2017   Hyperlipidemia 09/06/2014   Healthcare maintenance 12/01/2012   CKD (chronic kidney disease), stage III (HCC) 09/01/2012   Bipolar affective (HCC) 11/06/2005   Essential hypertension 11/06/2005   Gynecomastia 11/06/2005   PCP:  Andrey Campanile, MD Pharmacy:   [facility] [phone] [place], [place] - [address] AT [facility] [address] [place] [address] Phone: [phone] Fax: [phone]  [facility] [phone] - [place], [place] - [address] AT [facility] [address] [place] [address] Phone: [phone] Fax: [phone]     Social Determinants of Health (SDOH) Interventions    Readmission Risk Interventions Readmission  Risk Prevention Plan 10/27/2020  Transportation Screening Complete  PCP or Specialist Appt within 3-5 Days Complete  HRI or Home Care Consult Complete  Social Work Consult for Recovery Care Planning/Counseling Complete  Some recent data might be hidden

## 2020-10-27 NOTE — Progress Notes (Signed)
Hypoglycemic Event  CBG: 69  Treatment: 8oz Juice Symptoms: None   Follow up CBG Result:120  Possible Reasons for Event: Inadequate meal intake      Kyle Montgomery

## 2020-10-27 NOTE — Evaluation (Signed)
Physical Therapy Evaluation Patient Details Name: Kyle Montgomery MRN: [id number] DOB: 1975/03/28 Today's Date: 10/27/2020       Clinical Impression  Pt is a 45 y.o. male with below HPI resulting in the deficits listed below (see PT Problem List). Pt was most recently using transfers to w/c for primary means of mobility and reports use of cructhes prior to that, unsure how long it has been since pt has been ambulatory as he was unable to recall and tell therapist. Pt was very anxious with mobility and fearful due to pain in Lt LE. Min A provided for supine to sit with cues for sequencing. Unsuccessful attempts with sit to stand transfers x3 despite MAX A+2 and trial of different techniques to perform. Recommend SNF at this time as pt is currently below baseline mobility level and requires increased assist +2. Pt will benefit from skilled PT to maximize safety with functional mobility and increase independence.         Recommendations for follow up therapy are one component of a multi-disciplinary discharge planning process, led by the attending physician.  Recommendations may be updated based on patient status, additional functional criteria and insurance authorization.  Follow Up Recommendations   SNF      10/27/20 0820  PT Visit Information  Last PT Received On 10/27/20  Assistance Needed +2  History of Present Illness Kyle Montgomery is a 45 y.o. male  Presenting with nausea an vomiting CT ab/pelvis showed diffuse gas w/ fluid distended colon admitted for Colonic Distention: s/p colonic decompression. He was recently admitted to the [facility] for septic arthritis of the left SI joint. He was responding to abx. So he was discharged on doxy and augmentin on 10/19/20. PMH significant for DM2, bipolar d/o, CKD3b, HLD.  Precautions  Precautions Fall  Restrictions  Weight Bearing Restrictions No  Home Living  Family/patient expects to be discharged to: Private residence (mother and  step father)  Living Arrangements Parent  Available Help at Discharge Family  Type of Home House  Home Access Stairs to enter  Entrance Stairs-Number of Steps 5  Entrance Stairs-Rails Can reach both  Home Layout One level  Bathroom Shower/Tub Walk-in Pension scheme manager Yes  Home Equipment Crutches;Wheelchair - [employer] - 2 wheels;BSC;Shower seat;Grab bars - tub/shower  Additional Comments mother present 24/7.  Prior Function  Level of Independence Needs assistance  Gait / Transfers Assistance Needed pt reports he was using cructhes  ADL's / Homemaking Assistance Needed assist from mother with dressing and bathing recently since onset of LE pain  Comments pt reports he has not been up walking, unable to quantify how long. Reports he was up walking with cructhes, but now seems pt has been using w/c for mobility most recently.  Communication  Communication No difficulties  Pain Assessment  Pain Assessment 0-10  Faces Pain Scale 8  Pain Location L hip  Pain Descriptors / Indicators Aching;Discomfort  Pain Intervention(s) Limited activity within patient's tolerance;Monitored during session;Repositioned  Cognition  Arousal/Alertness Awake/alert  Behavior During Therapy Flat affect;Anxious  Overall Cognitive Status No family/caregiver present to determine baseline cognitive functioning  General Comments Pt requiring repeated cues/questions and increased time to process. Pt also telling PT he was using cructhes for ambulation prior to current admission, but per RN and CCM pt has been using w/c for mobility which pt confirms when asked by PT. Pt oriented to yr/month with increased time to verbalize.  Upper Extremity Assessment  Upper Extremity Assessment Overall WFL for tasks assessed  Lower Extremity Assessment  Lower Extremity Assessment RLE deficits/detail;LLE deficits/detail  RLE Deficits / Details Pt able to perform ankle pumps/toe wiggles.  Unable to perform SLR/clear R LE off EOB, unsure if due to weakness or pt difficulty with comprehending proper technique despite verbal, visual, and tactile cues. Able to perform SAQ.  RLE Sensation WNL  LLE Unable to fully assess due to pain  LLE Sensation WNL  Cervical / Trunk Assessment  Cervical / Trunk Assessment Normal  Bed Mobility  Overal bed mobility Needs Assistance  Bed Mobility Supine to Sit;Sit to Supine  Supine to sit Min assist;HOB elevated  Sit to supine Min guard;HOB elevated  General bed mobility comments Pt initially deferring mobility but agreeable with encouragement from PT. Pt attemtpting to bring Lt LE to EOB but grmacing and groaning in pain stating "my whole leg down to the knee" when asked where pain is. MIN A for progression of Lt LE to EOB with increased time. Pt constantly stating "hold, hold, hold" referring to Lt LE wanting therapist to support it all the way down to floor without letting go. Cues for sequencing and use of UEs to assist with bringing trunk to upright.Pt able to perform sit to supine with MIN guard and increased time with use of UEs to progress Lt LE onto bed. pt able to use UEs to slide up to [facility] for repositioning.  Transfers  Overall transfer level Needs assistance  Equipment used Rolling walker (2 wheeled)  Transfers Sit to/from Stand  Sit to Stand Max assist;+2 physical assistance;+2 safety/equipment;From elevated surface  General transfer comment Pt with unsuccessful sit to stand attempts x3 despite MAX A+2 from therapist and RN. Cues provided for different techniques with hand placement and increased use of B UEs and R LE for support to stand. Pt attempting lateral scooting toward recliner, but unable to achieve due to pain and "pressure" in Lt hip.  Balance  Overall balance assessment Needs assistance  Sitting-balance support Feet supported  Sitting balance-Leahy Scale Good  PT - End of Session  Equipment Utilized During Treatment Gait belt   Activity Tolerance Patient limited by pain  Patient left in bed;with call bell/phone within reach;with bed alarm set;with nursing/sitter in room  Nurse Communication Mobility status  PT Assessment  PT Recommendation/Assessment Patient needs continued PT services  PT Visit Diagnosis Other abnormalities of gait and mobility (R26.89);Pain  Pain - Right/Left Left  Pain - part of body Hip  PT Problem List Decreased strength;Decreased range of motion;Decreased activity tolerance;Decreased balance;Decreased mobility;Decreased knowledge of use of DME;Pain  PT Plan  PT Frequency (ACUTE ONLY) Min 2X/week  PT Treatment/Interventions (ACUTE ONLY) DME instruction;Gait training;Stair training;Functional mobility training;Therapeutic activities;Therapeutic exercise;Balance training;Neuromuscular re-education;Cognitive remediation;Patient/family education  AM-PAC PT "6 Clicks" Mobility Outcome Measure (Version 2)  Help needed turning from your back to your side while in a flat bed without using bedrails? 3  Help needed moving from lying on your back to sitting on the side of a flat bed without using bedrails? 3  Help needed moving to and from a bed to a chair (including a wheelchair)? 1  Help needed standing up from a chair using your arms (e.g., wheelchair or bedside chair)? 1  Help needed to walk in hospital room? 1  Help needed climbing 3-5 steps with a railing?  1  6 Click Score 10  Consider Recommendation of Discharge To: CIR/SNF/LTACH  Progressive Mobility  What is the highest level of  mobility based on the progressive mobility assessment? Level 2 (Chairfast) - Balance while sitting on edge of bed and cannot stand  Mobility Sit up in bed/chair position for meals  PT Recommendation  Follow Up Recommendations SNF  Individuals Consulted  Consulted and Agree with Results and Recommendations Patient  Acute Rehab PT Goals  Patient Stated Goal heal up and have less pain with mobility  PT Goal  Formulation With patient  Time For Goal Achievement 11/10/20  Potential to Achieve Goals Good  PT Time Calculation  PT Start Time (ACUTE ONLY) 1224  PT Stop Time (ACUTE ONLY) 1310  PT Time Calculation (min) (ACUTE ONLY) 46 min  PT General Charges  $$ ACUTE PT VISIT 1 Visit  PT Evaluation  $PT Eval Low Complexity 1 Low  PT Treatments  $Therapeutic Activity 23-37 mins        Lyman Speller PT, DPT  Acute Rehabilitation Services  Office [phone]  10/27/2020, 4:33 PM

## 2020-10-27 NOTE — Progress Notes (Signed)
Vanco trough level 23. RN notified MD and pharmacy.

## 2020-10-28 DIAGNOSIS — K567 Ileus, unspecified: Secondary | ICD-10-CM | POA: Diagnosis not present

## 2020-10-28 LAB — CULTURE, BLOOD (ROUTINE X 2)
Culture: NO GROWTH
Culture: NO GROWTH
Special Requests: ADEQUATE
Special Requests: ADEQUATE

## 2020-10-28 LAB — GLUCOSE, CAPILLARY
Glucose-Capillary: 147 mg/dL — ABNORMAL HIGH (ref 70–99)
Glucose-Capillary: 121 mg/dL — ABNORMAL HIGH (ref 70–99)

## 2020-10-28 NOTE — Progress Notes (Addendum)
PROGRESS NOTE  Kyle Montgomery FKC:[id number] DOB: 1975/08/03 DOA: 10/23/2020 PCP: Andrey Campanile, MD  HPI/Recap of past 30 hours: 45 year old male with bipolar disorder type 2 diabetes mellitus chronic kidney disease stage IIIb, septic arthritis recently discharged on October 19, 2020 on doxycycline and Augmentin but was readmitted October 3 with nausea vomiting x-ray revealed diffuse colonic dilatation rule out toxic megacolon.  Patient was treated with colonic decompression   Subjective: October 27, 2020: Patient seen and examined at bedside denies any new complaint.  Overnight he had a low blood sugar of 69 that was treated by covering ninth provider  October 28, 2020: Patient seen and examined at bedside He denies any new complain his current blood sugar is normal it is 127 fasting He still on clear liquids he stated he was not ready to advance his diet yet  Assessment/Plan: Principal Problem:   Ileus [facility]): Colonic Active Problems:   Bipolar affective (HCC)   Essential hypertension   CKD (chronic kidney disease), stage III (HCC)   Hyperlipidemia   Septic arthritis (HCC)   SIRS (systemic inflammatory response syndrome) (HCC)   Hypoxia   Hypokalemia   Overweight (BMI 25.0-29.9)  1.  Colonic dilatation up to 10 cm with megacolon Ileus Status post colonic decompression Doing much better Continue to monitor and avoid narcotics Today's abdominal x-ray shows Prominent diffuse gaseous distention of the visualized colon, most prominent in the cecum measuring approximately 10.1 cm diameter, increased from 8.5 cm.  2.  Septic arthritis of the left SI joint continue Unasyn Patient stated to have refused MRI  3.  Hypoxia resolved spontaneously VQ scan was negative for PE  4.  Chronic kidney disease stage III. His creatinine today is 1.7  Code Status: Full  Severity of Illness: The appropriate patient status for this patient is INPATIENT. Inpatient status is judged  to be reasonable and necessary in order to provide the required intensity of service to ensure the patient's safety. The patient's presenting symptoms, physical exam findings, and initial radiographic and laboratory data in the context of their chronic comorbidities is felt to place them at high risk for further clinical deterioration. Furthermore, it is not anticipated that the patient will be medically stable for discharge from the hospital within 2 midnights of admission. The following factors support the patient status of inpatient.   " Not medically ready and IV Unasyn   * I certify that at the point of admission it is my clinical judgment that the patient will require inpatient hospital care spanning beyond 2 midnights from the point of admission due to high intensity of service, high risk for further deterioration and high frequency of surveillance required.*   Family Communication: Patient  Disposition Plan: To be determined Status is: Inpatient   Dispo: The patient is from: Home              Anticipated d/c is to:               Anticipated d/c date is:               Patient currently not medically stable for discharge  Consultants: Gastroenterology  Procedures: Colonic decompression  Antimicrobials: Unasyn  DVT prophylaxis: Lovenox   Objective: Vitals:   10/26/20 2105 10/27/20 0403 10/27/20 1356 10/27/20 1356  BP:  (!) 133/92 (!) 135/95 (!) 135/95  Pulse:  91 (!) 110 (!) 110  Resp:  18 18 18   Temp:  98.8 F (37.1 C) 98.4 F (36.9 C)  98.4 F (36.9 C)  TempSrc:  Oral Oral Oral  SpO2: 97% 95%  96%  Weight:      Height:        Intake/Output Summary (Last 24 hours) at 10/28/2020 0934 Last data filed at 10/28/2020 0522 Gross per 24 hour  Intake 1734 ml  Output 3550 ml  Net -1816 ml    Filed Weights   10/24/20 0042 10/25/20 1145  Weight: 91.1 kg 91.1 kg   Body mass index is 28.82 kg/m.  Exam:  General: 45 y.o. year-old male well developed well nourished  in no acute distress.  Alert and oriented x3. Cardiovascular: Regular rate and rhythm with no rubs or gallops.  No thyromegaly or JVD noted.   Respiratory: Clear to auscultation with no wheezes or rales. Good inspiratory effort. Abdomen: Slightly distended hypoactive bowel sound Musculoskeletal: No lower extremity edema. 2/4 pulses in all 4 extremities. Skin: No ulcerative lesions noted or rashes, Psychiatry: Mood is appropriate for condition and setting Neurology:    Data Reviewed: CBC: Recent Labs  Lab 10/23/20 0633 10/23/20 1314 10/24/20 0527 10/25/20 0516  WBC 13.3* 13.3* 14.1* 12.8*  NEUTROABS 11.5*  --   --  8.7*  HGB 12.8* 11.4* 12.1* 11.7*  HCT 40.4 37.5* 38.0* 37.3*  MCV 86.9 90.8 87.4 88.2  PLT 461* 390 409* 451*    Basic Metabolic Panel: Recent Labs  Lab 10/23/20 0730 10/23/20 0825 10/23/20 1314 10/24/20 0527 10/25/20 0516 10/27/20 0529  NA 142  --   --  147* 146* 144  K 3.1*  --   --  3.6 3.9 3.8  CL 104  --   --  115* 112* 115*  CO2 25  --   --  23 24 21*  GLUCOSE 148*  --   --  131* 143* 115*  BUN 44*  --   --  37* 33* 32*  CREATININE 2.13*  --  1.99* 1.92* 1.91* 1.70*  CALCIUM 10.0  --   --  10.2 9.4 9.4  MG  --  2.1  --   --  2.0  --     GFR: Estimated Creatinine Clearance: 62.2 mL/min (A) (by C-G formula based on SCr of 1.7 mg/dL (H)). Liver Function Tests: Recent Labs  Lab 10/23/20 0730 10/24/20 0527  AST 39 29  ALT 56* 42  ALKPHOS 57 52  BILITOT 0.6 0.6  PROT 8.3* 7.8  ALBUMIN 3.2* 2.9*    Recent Labs  Lab 10/23/20 0730  LIPASE 66*    No results for input(s): AMMONIA in the last 168 hours. Coagulation Profile: No results for input(s): INR, PROTIME in the last 168 hours. Cardiac Enzymes: No results for input(s): CKTOTAL, CKMB, CKMBINDEX, TROPONINI in the last 168 hours. BNP (last 3 results) No results for input(s): PROBNP in the last 8760 hours. HbA1C: No results for input(s): HGBA1C in the last 72 hours. CBG: Recent Labs   Lab 10/26/20 2136 10/27/20 0217 10/27/20 0752 10/27/20 1126 10/27/20 1735  GLUCAP 106* 102* 113* 93 90    Lipid Profile: No results for input(s): CHOL, HDL, LDLCALC, TRIG, CHOLHDL, LDLDIRECT in the last 72 hours. Thyroid Function Tests: No results for input(s): TSH, T4TOTAL, FREET4, T3FREE, THYROIDAB in the last 72 hours. Anemia Panel: No results for input(s): VITAMINB12, FOLATE, FERRITIN, TIBC, IRON, RETICCTPCT in the last 72 hours. Urine analysis:    Component Value Date/Time   COLORURINE YELLOW 10/23/2020 0649   APPEARANCEUR CLEAR 10/23/2020 0649   LABSPEC 1.012 10/23/2020 0649   PHURINE  5.0 10/23/2020 0649   GLUCOSEU NEGATIVE 10/23/2020 0649   GLUCOSEU NEG mg/dL 22/48/2500 3704   HGBUR SMALL (A) 10/23/2020 0649   BILIRUBINUR NEGATIVE 10/23/2020 0649   KETONESUR NEGATIVE 10/23/2020 0649   PROTEINUR 100 (A) 10/23/2020 0649   UROBILINOGEN 0.2 01/09/2012 1007   NITRITE NEGATIVE 10/23/2020 0649   LEUKOCYTESUR NEGATIVE 10/23/2020 0649   Sepsis Labs: @LABRCNTIP (procalcitonin:4,lacticidven:4)  ) Recent Results (from the past 240 hour(s))  Culture, blood (routine x 2)     Status: None (Preliminary result)   Collection Time: 10/23/20  8:15 AM   Specimen: BLOOD  Result Value Ref Range Status   Specimen Description   Final    BLOOD BLOOD LEFT HAND Performed at [facility], [address] [place], [place]    Special Requests   Final    BOTTLES DRAWN AEROBIC ONLY Blood Culture adequate volume Performed at [facility], [address] [place], [place]    Culture   Final    NO GROWTH 4 DAYS Performed at [facility], [address] [place], [place]    Report Status PENDING  Incomplete  Culture, blood (routine x 2)     Status: None (Preliminary result)   Collection Time: 10/23/20  8:25 AM   Specimen: BLOOD  Result Value Ref Range Status   Specimen Description   Final    BLOOD BLOOD RIGHT  WRIST Performed at [facility], [address], [place], [place]    Special Requests   Final    BOTTLES DRAWN AEROBIC ONLY Blood Culture adequate volume Performed at [facility], [address] [place], [place]    Culture   Final    NO GROWTH 4 DAYS Performed at [facility], [address], [place], [place]    Report Status PENDING  Incomplete  Resp Panel by RT-PCR (Flu A&B, Covid) Nasopharyngeal Swab     Status: None   Collection Time: 10/23/20  8:25 AM   Specimen: Nasopharyngeal Swab; Nasopharyngeal(NP) swabs in vial transport medium  Result Value Ref Range Status   SARS Coronavirus 2 by RT PCR NEGATIVE NEGATIVE Final    Comment: (NOTE) SARS-CoV-2 target nucleic acids are NOT DETECTED.  The SARS-CoV-2 RNA is generally detectable in upper respiratory specimens during the acute phase of infection. The lowest concentration of SARS-CoV-2 viral copies this assay can detect is 138 copies/mL. A negative result does not preclude SARS-Cov-2 infection and should not be used as the sole basis for treatment or other patient management decisions. A negative result may occur with  improper specimen collection/handling, submission of specimen other than nasopharyngeal swab, presence of viral mutation(s) within the areas targeted by this assay, and inadequate number of viral copies(<138 copies/mL). A negative result must be combined with clinical observations, patient history, and epidemiological information. The expected result is Negative.  Fact Sheet for Patients:  12/23/20  Fact Sheet for Healthcare Providers:  BloggerCourse.com  This test is no t yet approved or cleared by the [employer] FDA and  has been authorized for detection and/or diagnosis of SARS-CoV-2 by FDA under an Emergency Use Authorization (EUA). This EUA will remain  in effect  (meaning this test can be used) for the duration of the COVID-19 declaration under Section 564(b)(1) of the Act, 21 U.S.C.section 360bbb-3(b)(1), unless the authorization is terminated  or revoked sooner.       Influenza A by PCR NEGATIVE NEGATIVE  Final   Influenza B by PCR NEGATIVE NEGATIVE Final    Comment: (NOTE) The Xpert Xpress SARS-CoV-2/FLU/RSV plus assay is intended as an aid in the diagnosis of influenza from Nasopharyngeal swab specimens and should not be used as a sole basis for treatment. Nasal washings and aspirates are unacceptable for Xpert Xpress SARS-CoV-2/FLU/RSV testing.  Fact Sheet for Patients: BloggerCourse.com  Fact Sheet for Healthcare Providers: SeriousBroker.it  This test is not yet approved or cleared by the [place] FDA and has been authorized for detection and/or diagnosis of SARS-CoV-2 by FDA under an Emergency Use Authorization (EUA). This EUA will remain in effect (meaning this test can be used) for the duration of the COVID-19 declaration under Section 564(b)(1) of the Act, 21 U.S.C. section 360bbb-3(b)(1), unless the authorization is terminated or revoked.  Performed at [facility], [address] [place], [place] [address]       Studies: DG Abd Portable 1V  Result Date: 10/27/2020 CLINICAL DATA:  Ileus EXAM: PORTABLE ABDOMEN - 1 VIEW COMPARISON:  10/26/2020 abdominal radiograph FINDINGS: Lower abdomen is not visualized on this single semi erect portable abdomen radiograph. No evidence of pneumatosis or pneumoperitoneum. Prominent diffuse gaseous distention of the visualized colon, most prominent in the cecum measuring approximately 10.1 cm diameter, increased from 8.5 cm. No definite small bowel dilatation. No radiopaque nephrolithiasis. Mild bibasilar platelike scarring versus atelectasis. IMPRESSION: Prominent diffuse gaseous distention of the visualized colon, most  prominent in the cecum, increased, compatible with worsening adynamic ileus. Electronically Signed   By: Delbert Phenix M.D.   On: 10/27/2020 14:56    Scheduled Meds:  amLODipine  10 mg Oral Daily   divalproex  500 mg Oral QHS   enoxaparin (LOVENOX) injection  40 mg Subcutaneous Q24H   gabapentin  300 mg Oral QHS   insulin aspart  0-9 Units Subcutaneous TID WC   lisinopril  40 mg Oral Daily   mouth rinse  15 mL Mouth Rinse BID   polyethylene glycol  17 g Oral BID   psyllium  1 packet Oral BID   ziprasidone  80 mg Oral BID    Continuous Infusions:  ampicillin-sulbactam (UNASYN) IV 3 g (10/28/20 0234)   dextrose 5 % and 0.45 % NaCl with KCl 40 mEq/L 125 mL/hr at 10/28/20 0925   vancomycin       LOS: 5 days     Myrtie Neither, MD [facility]  To reach me or the doctor on call, go to: [email or web address] Password [facility]  10/28/2020, 9:34 AM

## 2020-10-28 NOTE — Progress Notes (Signed)
Subjective: Patient complains of continued pain on left hip, worse on movement, causing challenge with mobility. Bowel movement today in a.m. documented as large brown, liquid. Prior to that last bowel movement was on 10/27/2020.   Objective: Vital signs in last 24 hours: Temp:  [98.4 F (36.9 C)] 98.4 F (36.9 C) (10/07 1356) Pulse Rate:  [110] 110 (10/07 1356) Resp:  [18] 18 (10/07 1356) BP: (135)/(95) 135/95 (10/07 1356) SpO2:  [96 %] 96 % (10/07 1356) Weight change:  Last BM Date: 10/26/20  PE: Appears comfortable GENERAL: No pallor, no icterus  ABDOMEN: Distended but soft, bowel sounds audible EXTREMITIES: Left hip pain and tenderness  Lab Results: Results for orders placed or performed during the hospital encounter of 10/23/20 (from the past 48 hour(s))  Glucose, capillary     Status: Abnormal   Collection Time: 10/26/20  4:53 PM  Result Value Ref Range   Glucose-Capillary 192 (H) 70 - 99 mg/dL    Comment: Glucose reference range applies only to samples taken after fasting for at least 8 hours.  Glucose, capillary     Status: Abnormal   Collection Time: 10/26/20  8:56 PM  Result Value Ref Range   Glucose-Capillary 69 (L) 70 - 99 mg/dL    Comment: Glucose reference range applies only to samples taken after fasting for at least 8 hours.  Glucose, capillary     Status: Abnormal   Collection Time: 10/26/20  9:36 PM  Result Value Ref Range   Glucose-Capillary 106 (H) 70 - 99 mg/dL    Comment: Glucose reference range applies only to samples taken after fasting for at least 8 hours.  Glucose, capillary     Status: Abnormal   Collection Time: 10/27/20  2:17 AM  Result Value Ref Range   Glucose-Capillary 102 (H) 70 - 99 mg/dL    Comment: Glucose reference range applies only to samples taken after fasting for at least 8 hours.  Basic metabolic panel     Status: Abnormal   Collection Time: 10/27/20  5:29 AM  Result Value Ref Range   Sodium 144 135 - 145 mmol/L   Potassium  3.8 3.5 - 5.1 mmol/L   Chloride 115 (H) 98 - 111 mmol/L   CO2 21 (L) 22 - 32 mmol/L   Glucose, Bld 115 (H) 70 - 99 mg/dL    Comment: Glucose reference range applies only to samples taken after fasting for at least 8 hours.   BUN 32 (H) 6 - 20 mg/dL   Creatinine, Ser 7.62 (H) 0.61 - 1.24 mg/dL   Calcium 9.4 8.9 - 83.1 mg/dL   GFR, Estimated 50 (L) >60 mL/min    Comment: (NOTE) Calculated using the CKD-EPI Creatinine Equation (2021)    Anion gap 8 5 - 15    Comment: Performed at [facility], [address], [place], [place] [address]  Glucose, capillary     Status: Abnormal   Collection Time: 10/27/20  7:52 AM  Result Value Ref Range   Glucose-Capillary 113 (H) 70 - 99 mg/dL    Comment: Glucose reference range applies only to samples taken after fasting for at least 8 hours.  Glucose, capillary     Status: None   Collection Time: 10/27/20 11:26 AM  Result Value Ref Range   Glucose-Capillary 93 70 - 99 mg/dL    Comment: Glucose reference range applies only to samples taken after fasting for at least 8 hours.  Vancomycin, trough     Status: Abnormal  Collection Time: 10/27/20 12:31 PM  Result Value Ref Range   Vancomycin Tr 23 (HH) 15 - 20 ug/mL    Comment: CRITICAL RESULT CALLED TO, READ BACK BY AND VERIFIED WITH: S.POWELL, RN AT 1517 ON 10.07.22 BY N.[email or web address] Performed at [facility], [address], [place], [place] [address]   Glucose, capillary     Status: None   Collection Time: 10/27/20  5:35 PM  Result Value Ref Range   Glucose-Capillary 90 70 - 99 mg/dL    Comment: Glucose reference range applies only to samples taken after fasting for at least 8 hours.    Studies/Results: DG Abd Portable 1V  Result Date: 10/27/2020 CLINICAL DATA:  Ileus EXAM: PORTABLE ABDOMEN - 1 VIEW COMPARISON:  10/26/2020 abdominal radiograph FINDINGS: Lower abdomen is not visualized on this single semi erect portable abdomen radiograph. No evidence of  pneumatosis or pneumoperitoneum. Prominent diffuse gaseous distention of the visualized colon, most prominent in the cecum measuring approximately 10.1 cm diameter, increased from 8.5 cm. No definite small bowel dilatation. No radiopaque nephrolithiasis. Mild bibasilar platelike scarring versus atelectasis. IMPRESSION: Prominent diffuse gaseous distention of the visualized colon, most prominent in the cecum, increased, compatible with worsening adynamic ileus. Electronically Signed   By: Delbert Phenix M.D.   On: 10/27/2020 14:56   DG Abd Portable 1V  Result Date: 10/26/2020 CLINICAL DATA:  Ileus EXAM: PORTABLE ABDOMEN - 1 VIEW COMPARISON:  10/25/2020 FINDINGS: Lung volumes are small with mild elevation of the right hemidiaphragm. Mild bibasilar atelectasis or infiltrate. No free intraperitoneal gas. The pelvis is excluded from view. Visualized loops of gas-filled mildly dilated large bowel are unchanged measuring up to 8.5 cm in greatest dimension. IMPRESSION: Stable gas-filled loops of dilated large bowel within the visualized upper abdomen. No free air. Electronically Signed   By: Helyn Numbers M.D.   On: 10/26/2020 15:12    Medications: I have reviewed the patient's current medications.  Assessment: Colonic ileus, despite colonoscopy with decompression, repeat x-ray shows worsening adynamic ileus Potassium 3.8 yesterday  Plan: Physical therapy evaluation noted, unsuccessful attempts with sit to stand transfers. This will pose significant challenge and will worsen adynamic ileus.  Plan to continue him on clear liquid diet with MiraLAX twice a day and Metamucil twice a day.  Recommend keeping potassium above 4 and magnesium above 2.  Kerin Salen, MD 10/28/2020, 12:12 PM

## 2020-10-29 DIAGNOSIS — K567 Ileus, unspecified: Secondary | ICD-10-CM | POA: Diagnosis not present

## 2020-10-29 LAB — BASIC METABOLIC PANEL
Anion gap: 11 (ref 5–15)
BUN: 22 mg/dL — ABNORMAL HIGH (ref 6–20)
CO2: 19 mmol/L — ABNORMAL LOW (ref 22–32)
Calcium: 9.6 mg/dL (ref 8.9–10.3)
Chloride: 121 mmol/L — ABNORMAL HIGH (ref 98–111)
Creatinine, Ser: 1.77 mg/dL — ABNORMAL HIGH (ref 0.61–1.24)
GFR, Estimated: 48 mL/min — ABNORMAL LOW (ref >60)
Glucose, Bld: 115 mg/dL — ABNORMAL HIGH (ref 70–99)
Potassium: 3.5 mmol/L (ref 3.5–5.1)
Sodium: 151 mmol/L — ABNORMAL HIGH (ref 135–145)

## 2020-10-29 LAB — GLUCOSE, CAPILLARY
Glucose-Capillary: 134 mg/dL — ABNORMAL HIGH (ref 70–99)
Glucose-Capillary: 108 mg/dL — ABNORMAL HIGH (ref 70–99)
Glucose-Capillary: 89 mg/dL (ref 70–99)
Glucose-Capillary: 119 mg/dL — ABNORMAL HIGH (ref 70–99)

## 2020-10-29 LAB — MAGNESIUM: Magnesium: 1.9 mg/dL (ref 1.7–2.4)

## 2020-10-29 MED ORDER — POTASSIUM CHLORIDE CRYS ER 20 MEQ PO TBCR
20.0000 meq | EXTENDED_RELEASE_TABLET | Freq: Once | ORAL | Status: AC
  Administered 2020-10-29: 20 meq via ORAL
  Filled 2020-10-29: qty 1

## 2020-10-29 MED ORDER — MAGNESIUM SULFATE 2 GM/50ML IV SOLN
2.0000 g | Freq: Once | INTRAVENOUS | Status: AC
  Administered 2020-10-29: 2 g via INTRAVENOUS
  Filled 2020-10-29: qty 50

## 2020-10-29 NOTE — Progress Notes (Addendum)
PROGRESS NOTE  Kyle Montgomery DGU:[id number] DOB: [birth year] DOA: 10/23/2020 PCP: Andrey Campanile, MD  HPI/Recap of past 38 hours: 45 year old male with bipolar disorder type 2 diabetes mellitus chronic kidney disease stage IIIb, septic arthritis recently discharged on October 19, 2020 on doxycycline and Augmentin but was readmitted October 3 with nausea vomiting x-ray revealed diffuse colonic dilatation rule out toxic megacolon.  Patient was treated with colonic decompression   Subjective: October 27, 2020: Patient seen and examined at bedside denies any new complaint.  Overnight he had a low blood sugar of 69 that was treated by covering ninth provider  October 28, 2020: Patient seen and examined at bedside He denies any new complain his current blood sugar is normal it is 127 fasting He still on clear liquids he stated he was not ready to advance his diet yet  October 29, 2020: Patient seen and examined at bedside he is doing better no nausea vomiting Still on clear liquids Patient stated that he is doing well better he is able to lift his legs and move his lower extremity on.  His own he demonstrated  Assessment/Plan: Principal Problem:   [facility]): Colonic Active Problems:   Bipolar affective (HCC)   Essential hypertension   CKD (chronic kidney disease), stage III (HCC)   Hyperlipidemia   Septic arthritis (HCC)   SIRS (systemic inflammatory response syndrome) (HCC)   Hypoxia   Hypokalemia   Overweight (BMI 25.0-29.9)  1.  Colonic dilatation up to 10 cm with megacolon Ileus Status post colonic decompression Doing much better Continue to monitor and avoid narcotics Today's abdominal x-ray shows Prominent diffuse gaseous distention of the visualized colon, most prominent in the cecum measuring approximately 10.1 cm diameter, increased from 8.5 cm.  2.  Septic arthritis of the left SI joint continue Unasyn Patient stated to have refused MRI  3.  Hypoxia  resolved spontaneously VQ scan was negative for PE  4.  Chronic kidney disease stage III. His creatinine today is 1.7    Code Status: Full  Severity of Illness: The appropriate patient status for this patient is INPATIENT. Inpatient status is judged to be reasonable and necessary in order to provide the required intensity of service to ensure the patient's safety. The patient's presenting symptoms, physical exam findings, and initial radiographic and laboratory data in the context of their chronic comorbidities is felt to place them at high risk for further clinical deterioration. Furthermore, it is not anticipated that the patient will be medically stable for discharge from the hospital within 2 midnights of admission. The following factors support the patient status of inpatient.   " Not medically ready and IV Unasyn   * I certify that at the point of admission it is my clinical judgment that the patient will require inpatient hospital care spanning beyond 2 midnights from the point of admission due to high intensity of service, high risk for further deterioration and high frequency of surveillance required.*   Family Communication: Patient  Disposition Plan: To be determined Status is: Inpatient   Dispo: The patient is from: Home              Anticipated d/c is to:               Anticipated d/c date is:               Patient currently not medically stable for discharge  Consultants: Gastroenterology  Procedures: Colonic decompression  Antimicrobials: Unasyn  DVT prophylaxis:  Lovenox   Objective: Vitals:   10/29/20 0519 10/29/20 1129 10/29/20 1133 10/29/20 1319  BP: (!) 135/93 (!) 135/93 (!) 135/93 (!) 138/101  Pulse: (!) 104   (!) 104  Resp:    16  Temp: 99 F (37.2 C)   99 F (37.2 C)  TempSrc: Oral   Oral  SpO2: 93%   93%  Weight:      Height:        Intake/Output Summary (Last 24 hours) at 10/29/2020 1728 Last data filed at 10/29/2020 1649 Gross per 24  hour  Intake --  Output 3850 ml  Net -3850 ml    Filed Weights   10/24/20 0042 10/25/20 1145  Weight: 91.1 kg 91.1 kg   Body mass index is 28.82 kg/m.  Exam:  General: 45 y.o. year-old male well developed well nourished in no acute distress.  Alert and oriented x3. Cardiovascular: Regular rate and rhythm with no rubs or gallops.  No thyromegaly or JVD noted.   Respiratory: Clear to auscultation with no wheezes or rales. Good inspiratory effort. Abdomen: Slightly distended hypoactive bowel sound Musculoskeletal: No lower extremity edema. 2/4 pulses in all 4 extremities. Skin: No ulcerative lesions noted or rashes, Psychiatry: Mood is appropriate for condition and setting Neurology:    Data Reviewed: CBC: Recent Labs  Lab 10/23/20 0633 10/23/20 1314 10/24/20 0527 10/25/20 0516  WBC 13.3* 13.3* 14.1* 12.8*  NEUTROABS 11.5*  --   --  8.7*  HGB 12.8* 11.4* 12.1* 11.7*  HCT 40.4 37.5* 38.0* 37.3*  MCV 86.9 90.8 87.4 88.2  PLT 461* 390 409* 451*    Basic Metabolic Panel: Recent Labs  Lab 10/23/20 0730 10/23/20 0825 10/23/20 1314 10/24/20 0527 10/25/20 0516 10/27/20 0529 10/29/20 0549  NA 142  --   --  147* 146* 144 151*  K 3.1*  --   --  3.6 3.9 3.8 3.5  CL 104  --   --  115* 112* 115* 121*  CO2 25  --   --  23 24 21* 19*  GLUCOSE 148*  --   --  131* 143* 115* 115*  BUN 44*  --   --  37* 33* 32* 22*  CREATININE 2.13*  --  1.99* 1.92* 1.91* 1.70* 1.77*  CALCIUM 10.0  --   --  10.2 9.4 9.4 9.6  MG  --  2.1  --   --  2.0  --  1.9    GFR: Estimated Creatinine Clearance: 59.8 mL/min (A) (by C-G formula based on SCr of 1.77 mg/dL (H)). Liver Function Tests: Recent Labs  Lab 10/23/20 0730 10/24/20 0527  AST 39 29  ALT 56* 42  ALKPHOS 57 52  BILITOT 0.6 0.6  PROT 8.3* 7.8  ALBUMIN 3.2* 2.9*    Recent Labs  Lab 10/23/20 0730  LIPASE 66*    No results for input(s): AMMONIA in the last 168 hours. Coagulation Profile: No results for input(s): INR,  PROTIME in the last 168 hours. Cardiac Enzymes: No results for input(s): CKTOTAL, CKMB, CKMBINDEX, TROPONINI in the last 168 hours. BNP (last 3 results) No results for input(s): PROBNP in the last 8760 hours. HbA1C: No results for input(s): HGBA1C in the last 72 hours. CBG: Recent Labs  Lab 10/28/20 1614 10/28/20 2107 10/29/20 0721 10/29/20 1207 10/29/20 1552  GLUCAP 147* 121* 134* 108* 89    Lipid Profile: No results for input(s): CHOL, HDL, LDLCALC, TRIG, CHOLHDL, LDLDIRECT in the last 72 hours. Thyroid Function Tests: No results for  input(s): TSH, T4TOTAL, FREET4, T3FREE, THYROIDAB in the last 72 hours. Anemia Panel: No results for input(s): VITAMINB12, FOLATE, FERRITIN, TIBC, IRON, RETICCTPCT in the last 72 hours. Urine analysis:    Component Value Date/Time   COLORURINE YELLOW 10/23/2020 0649   APPEARANCEUR CLEAR 10/23/2020 0649   LABSPEC 1.012 10/23/2020 0649   PHURINE 5.0 10/23/2020 0649   GLUCOSEU NEGATIVE 10/23/2020 0649   GLUCOSEU NEG mg/dL 60/45/4098 1191   HGBUR SMALL (A) 10/23/2020 0649   BILIRUBINUR NEGATIVE 10/23/2020 0649   KETONESUR NEGATIVE 10/23/2020 0649   PROTEINUR 100 (A) 10/23/2020 0649   UROBILINOGEN 0.2 01/09/2012 1007   NITRITE NEGATIVE 10/23/2020 0649   LEUKOCYTESUR NEGATIVE 10/23/2020 0649   Sepsis Labs: @LABRCNTIP (procalcitonin:4,lacticidven:4)  ) Recent Results (from the past 240 hour(s))  Culture, blood (routine x 2)     Status: None   Collection Time: 10/23/20  8:15 AM   Specimen: BLOOD  Result Value Ref Range Status   Specimen Description   Final    BLOOD BLOOD LEFT HAND Performed at [facility], [address] [place], [place]    Special Requests   Final    BOTTLES DRAWN AEROBIC ONLY Blood Culture adequate volume Performed at [facility], [address], [place], [place]    Culture   Final    NO GROWTH 5 DAYS Performed at [facility], [address],  [place], [place]    Report Status 10/28/2020 FINAL  Final  Culture, blood (routine x 2)     Status: None   Collection Time: 10/23/20  8:25 AM   Specimen: BLOOD  Result Value Ref Range Status   Specimen Description   Final    BLOOD BLOOD RIGHT WRIST Performed at [facility], [address] [place], [place]    Special Requests   Final    BOTTLES DRAWN AEROBIC ONLY Blood Culture adequate volume Performed at [facility], [address] [place], [place]    Culture   Final    NO GROWTH 5 DAYS Performed at [facility], [address] [place], [place]    Report Status 10/28/2020 FINAL  Final  Resp Panel by RT-PCR (Flu A&B, Covid) Nasopharyngeal Swab     Status: None   Collection Time: 10/23/20  8:25 AM   Specimen: Nasopharyngeal Swab; Nasopharyngeal(NP) swabs in vial transport medium  Result Value Ref Range Status   SARS Coronavirus 2 by RT PCR NEGATIVE NEGATIVE Final    Comment: (NOTE) SARS-CoV-2 target nucleic acids are NOT DETECTED.  The SARS-CoV-2 RNA is generally detectable in upper respiratory specimens during the acute phase of infection. The lowest concentration of SARS-CoV-2 viral copies this assay can detect is 138 copies/mL. A negative result does not preclude SARS-Cov-2 infection and should not be used as the sole basis for treatment or other patient management decisions. A negative result may occur with  improper specimen collection/handling, submission of specimen other than nasopharyngeal swab, presence of viral mutation(s) within the areas targeted by this assay, and inadequate number of viral copies(<138 copies/mL). A negative result must be combined with clinical observations, patient history, and epidemiological information. The expected result is Negative.  Fact Sheet for Patients:  12/23/20  Fact Sheet for Healthcare Providers:   BloggerCourse.com  This test is no t yet approved or cleared by the [employer] FDA and  has been authorized for detection and/or diagnosis of SARS-CoV-2 by FDA under  an Emergency Use Authorization (EUA). This EUA will remain  in effect (meaning this test can be used) for the duration of the COVID-19 declaration under Section 564(b)(1) of the Act, 21 U.S.C.section 360bbb-3(b)(1), unless the authorization is terminated  or revoked sooner.       Influenza A by PCR NEGATIVE NEGATIVE Final   Influenza B by PCR NEGATIVE NEGATIVE Final    Comment: (NOTE) The Xpert Xpress SARS-CoV-2/FLU/RSV plus assay is intended as an aid in the diagnosis of influenza from Nasopharyngeal swab specimens and should not be used as a sole basis for treatment. Nasal washings and aspirates are unacceptable for Xpert Xpress SARS-CoV-2/FLU/RSV testing.  Fact Sheet for Patients: BloggerCourse.com  Fact Sheet for Healthcare Providers: SeriousBroker.it  This test is not yet approved or cleared by the [place] FDA and has been authorized for detection and/or diagnosis of SARS-CoV-2 by FDA under an Emergency Use Authorization (EUA). This EUA will remain in effect (meaning this test can be used) for the duration of the COVID-19 declaration under Section 564(b)(1) of the Act, 21 U.S.C. section 360bbb-3(b)(1), unless the authorization is terminated or revoked.  Performed at [facility], [address] [place], [place] [address]       Studies: No results found.  Scheduled Meds:  amLODipine  10 mg Oral Daily   divalproex  500 mg Oral QHS   enoxaparin (LOVENOX) injection  40 mg Subcutaneous Q24H   gabapentin  300 mg Oral QHS   insulin aspart  0-9 Units Subcutaneous TID WC   lisinopril  40 mg Oral Daily   mouth rinse  15 mL Mouth Rinse BID   polyethylene glycol  17 g Oral BID   psyllium  1 packet Oral BID    ziprasidone  80 mg Oral BID    Continuous Infusions:  ampicillin-sulbactam (UNASYN) IV 3 g (10/29/20 1422)   dextrose 5 % and 0.45 % NaCl with KCl 40 mEq/L 125 mL/hr at 10/29/20 0834   vancomycin 1,000 mg (10/28/20 2212)     LOS: 6 days     Myrtie Neither, MD [facility]  To reach me or the doctor on call, go to: [email or web address] Password [email or web address]  10/29/2020, 5:28 PM

## 2020-10-29 NOTE — Progress Notes (Addendum)
Subjective: Patient states he is trying to do some exercises and leg movements on the bed, has not been able to get out of bed to chair or walk around. Last bowel movement reported as yesterday.  Objective: Vital signs in last 24 hours: Temp:  [98.9 F (37.2 C)-99 F (37.2 C)] 99 F (37.2 C) (10/09 0519) Pulse Rate:  [102-104] 104 (10/09 0519) Resp:  [20] 20 (10/08 1456) BP: (129-135)/(93-96) 135/93 (10/09 1133) SpO2:  [93 %-95 %] 93 % (10/09 0519) Weight change:  Last BM Date: 10/26/20  PE: Not in distress, lying on bed GENERAL: Able to speak in full sentences, not in distress  ABDOMEN:Distended abdomen, hypoactive bowel sounds EXTREMITIES: Left hip pain  Lab Results: Results for orders placed or performed during the hospital encounter of 10/23/20 (from the past 48 hour(s))  Glucose, capillary     Status: None   Collection Time: 10/27/20  5:35 PM  Result Value Ref Range   Glucose-Capillary 90 70 - 99 mg/dL    Comment: Glucose reference range applies only to samples taken after fasting for at least 8 hours.  Glucose, capillary     Status: Abnormal   Collection Time: 10/28/20  4:14 PM  Result Value Ref Range   Glucose-Capillary 147 (H) 70 - 99 mg/dL    Comment: Glucose reference range applies only to samples taken after fasting for at least 8 hours.   Comment 1 Notify RN   Glucose, capillary     Status: Abnormal   Collection Time: 10/28/20  9:07 PM  Result Value Ref Range   Glucose-Capillary 121 (H) 70 - 99 mg/dL    Comment: Glucose reference range applies only to samples taken after fasting for at least 8 hours.  Basic metabolic panel     Status: Abnormal   Collection Time: 10/29/20  5:49 AM  Result Value Ref Range   Sodium 151 (H) 135 - 145 mmol/L   Potassium 3.5 3.5 - 5.1 mmol/L   Chloride 121 (H) 98 - 111 mmol/L   CO2 19 (L) 22 - 32 mmol/L   Glucose, Bld 115 (H) 70 - 99 mg/dL    Comment: Glucose reference range applies only to samples taken after fasting for at  least 8 hours.   BUN 22 (H) 6 - 20 mg/dL   Creatinine, Ser 5.03 (H) 0.61 - 1.24 mg/dL   Calcium 9.6 8.9 - 88.8 mg/dL   GFR, Estimated 48 (L) >60 mL/min    Comment: (NOTE) Calculated using the CKD-EPI Creatinine Equation (2021)    Anion gap 11 5 - 15    Comment: Performed at [facility], [address], [place], [place] [address]  Magnesium     Status: None   Collection Time: 10/29/20  5:49 AM  Result Value Ref Range   Magnesium 1.9 1.7 - 2.4 mg/dL    Comment: Performed at [facility], [address] [place], [place] [address]  Glucose, capillary     Status: Abnormal   Collection Time: 10/29/20  7:21 AM  Result Value Ref Range   Glucose-Capillary 134 (H) 70 - 99 mg/dL    Comment: Glucose reference range applies only to samples taken after fasting for at least 8 hours.  Glucose, capillary     Status: Abnormal   Collection Time: 10/29/20 12:07 PM  Result Value Ref Range   Glucose-Capillary 108 (H) 70 - 99 mg/dL    Comment: Glucose reference range applies only to samples taken after fasting for at  least 8 hours.    Studies/Results: No results found.  Medications: I have reviewed the patient's current medications.  Assessment: Colonic ileus, status post decompression on 10/25/2020  Plan: Continue clear liquid diet for now Continue MiraLAX twice a day and Metamucil twice a day Minimize narcotics, currently on oxycodone 5 mg every 8 hours as needed Keep potassium at least above 4, 20 M EQ KCl ordered today Mobilization, out of bed to chair.  Patient had a colon polyp, which was not removed due to extremely poor prep. At 1 point patient will need repeat colonoscopy for removal of prep.  Kerin Salen, MD 10/29/2020, 1:12 PM

## 2020-10-30 DIAGNOSIS — K567 Ileus, unspecified: Secondary | ICD-10-CM | POA: Diagnosis not present

## 2020-10-30 LAB — GLUCOSE, CAPILLARY
Glucose-Capillary: 108 mg/dL — ABNORMAL HIGH (ref 70–99)
Glucose-Capillary: 127 mg/dL — ABNORMAL HIGH (ref 70–99)
Glucose-Capillary: 113 mg/dL — ABNORMAL HIGH (ref 70–99)
Glucose-Capillary: 109 mg/dL — ABNORMAL HIGH (ref 70–99)
Glucose-Capillary: 102 mg/dL — ABNORMAL HIGH (ref 70–99)
Glucose-Capillary: 157 mg/dL — ABNORMAL HIGH (ref 70–99)
Glucose-Capillary: 77 mg/dL (ref 70–99)

## 2020-10-30 LAB — BASIC METABOLIC PANEL
Anion gap: 6 (ref 5–15)
BUN: 19 mg/dL (ref 6–20)
CO2: 22 mmol/L (ref 22–32)
Calcium: 9.4 mg/dL (ref 8.9–10.3)
Chloride: 118 mmol/L — ABNORMAL HIGH (ref 98–111)
Creatinine, Ser: 1.53 mg/dL — ABNORMAL HIGH (ref 0.61–1.24)
GFR, Estimated: 57 mL/min — ABNORMAL LOW (ref >60)
Glucose, Bld: 120 mg/dL — ABNORMAL HIGH (ref 70–99)
Potassium: 3.9 mmol/L (ref 3.5–5.1)
Sodium: 146 mmol/L — ABNORMAL HIGH (ref 135–145)

## 2020-10-30 LAB — MAGNESIUM: Magnesium: 1.8 mg/dL (ref 1.7–2.4)

## 2020-10-30 MED ORDER — LOPERAMIDE HCL 1 MG/7.5ML PO SUSP
1.0000 mg | Freq: Once | ORAL | Status: AC
  Administered 2020-10-30: 1 mg via ORAL
  Filled 2020-10-30: qty 7.5

## 2020-10-30 MED ORDER — MAGNESIUM SULFATE 2 GM/50ML IV SOLN
2.0000 g | Freq: Once | INTRAVENOUS | Status: AC
  Administered 2020-10-30: 2 g via INTRAVENOUS
  Filled 2020-10-30: qty 50

## 2020-10-30 NOTE — Progress Notes (Signed)
Subjective: Patient is doing exercises and leg movements on the bed, has not been able to get out of bed to chair or walk around but wants to start doing that today.  Patient has had 3 total loose/diarrhea stools, last one this AM.  No melena and no hematochezia.   Objective: Vital signs in last 24 hours: Temp:  [98.3 F (36.8 C)-99 F (37.2 C)] 99 F (37.2 C) (10/10 0627) Pulse Rate:  [95-104] 95 (10/10 0627) Resp:  [16-20] 20 (10/09 2307) BP: (116-138)/(79-101) 117/79 (10/10 0627) SpO2:  [93 %-96 %] 96 % (10/10 0627) Weight change:  Last BM Date: 10/30/20  PE: Not in distress, lying on bed GENERAL: Able to speak in full sentences, ABDOMEN:Distended abdomen, normal bowel sounds EXTREMITIES: no edema  Lab Results: Results for orders placed or performed during the hospital encounter of 10/23/20 (from the past 48 hour(s))  Glucose, capillary     Status: Abnormal   Collection Time: 10/28/20  4:14 PM  Result Value Ref Range   Glucose-Capillary 147 (H) 70 - 99 mg/dL    Comment: Glucose reference range applies only to samples taken after fasting for at least 8 hours.   Comment 1 Notify RN   Glucose, capillary     Status: Abnormal   Collection Time: 10/28/20  9:07 PM  Result Value Ref Range   Glucose-Capillary 121 (H) 70 - 99 mg/dL    Comment: Glucose reference range applies only to samples taken after fasting for at least 8 hours.  Basic metabolic panel     Status: Abnormal   Collection Time: 10/29/20  5:49 AM  Result Value Ref Range   Sodium 151 (H) 135 - 145 mmol/L   Potassium 3.5 3.5 - 5.1 mmol/L   Chloride 121 (H) 98 - 111 mmol/L   CO2 19 (L) 22 - 32 mmol/L   Glucose, Bld 115 (H) 70 - 99 mg/dL    Comment: Glucose reference range applies only to samples taken after fasting for at least 8 hours.   BUN 22 (H) 6 - 20 mg/dL   Creatinine, Ser 3.76 (H) 0.61 - 1.24 mg/dL   Calcium 9.6 8.9 - 28.3 mg/dL   GFR, Estimated 48 (L) >60 mL/min    Comment: (NOTE) Calculated using the  CKD-EPI Creatinine Equation (2021)    Anion gap 11 5 - 15    Comment: Performed at [facility], [address], [place], [place] [address]  Magnesium     Status: None   Collection Time: 10/29/20  5:49 AM  Result Value Ref Range   Magnesium 1.9 1.7 - 2.4 mg/dL    Comment: Performed at [facility], [address] [place], [place] [address]  Glucose, capillary     Status: Abnormal   Collection Time: 10/29/20  7:21 AM  Result Value Ref Range   Glucose-Capillary 134 (H) 70 - 99 mg/dL    Comment: Glucose reference range applies only to samples taken after fasting for at least 8 hours.  Glucose, capillary     Status: Abnormal   Collection Time: 10/29/20 12:07 PM  Result Value Ref Range   Glucose-Capillary 108 (H) 70 - 99 mg/dL    Comment: Glucose reference range applies only to samples taken after fasting for at least 8 hours.  Glucose, capillary     Status: None   Collection Time: 10/29/20  3:52 PM  Result Value Ref Range   Glucose-Capillary 89 70 - 99 mg/dL    Comment: Glucose reference  range applies only to samples taken after fasting for at least 8 hours.  Glucose, capillary     Status: Abnormal   Collection Time: 10/29/20 11:04 PM  Result Value Ref Range   Glucose-Capillary 119 (H) 70 - 99 mg/dL    Comment: Glucose reference range applies only to samples taken after fasting for at least 8 hours.  Basic metabolic panel     Status: Abnormal   Collection Time: 10/30/20  5:28 AM  Result Value Ref Range   Sodium 146 (H) 135 - 145 mmol/L   Potassium 3.9 3.5 - 5.1 mmol/L   Chloride 118 (H) 98 - 111 mmol/L   CO2 22 22 - 32 mmol/L   Glucose, Bld 120 (H) 70 - 99 mg/dL    Comment: Glucose reference range applies only to samples taken after fasting for at least 8 hours.   BUN 19 6 - 20 mg/dL   Creatinine, Ser 7.34 (H) 0.61 - 1.24 mg/dL   Calcium 9.4 8.9 - 03.7 mg/dL   GFR, Estimated 57 (L) >60 mL/min    Comment: (NOTE) Calculated using the CKD-EPI  Creatinine Equation (2021)    Anion gap 6 5 - 15    Comment: Performed at [facility], [address], [place], [place] [address]  Magnesium     Status: None   Collection Time: 10/30/20  5:28 AM  Result Value Ref Range   Magnesium 1.8 1.7 - 2.4 mg/dL    Comment: Performed at [facility], [address], [place], [place] [address]  Glucose, capillary     Status: Abnormal   Collection Time: 10/30/20  7:50 AM  Result Value Ref Range   Glucose-Capillary 109 (H) 70 - 99 mg/dL    Comment: Glucose reference range applies only to samples taken after fasting for at least 8 hours.  Glucose, capillary     Status: Abnormal   Collection Time: 10/30/20 11:16 AM  Result Value Ref Range   Glucose-Capillary 102 (H) 70 - 99 mg/dL    Comment: Glucose reference range applies only to samples taken after fasting for at least 8 hours.    Studies/Results: No results found.  Medications: I have reviewed the patient's current medications.  Assessment: Colonic ileus, status post decompression on 10/25/2020  Plan: Continue clear liquid diet for now Patient having loose stools, cut back on miralax to once daily and continue metamucil Minimize narcotics Mobilization, out of bed to chair. Will continue to follow  Patient with poor prep on colonoscopy, will need repeat at sometime in the future.   Doree Albee, PA-C 10/30/2020, 11:52 AM

## 2020-10-30 NOTE — Progress Notes (Addendum)
Physical Therapy Treatment Patient Details Name: Kyle Montgomery MRN: [id number] DOB: 1976/01/09 Today's Date: 10/30/2020   History of Present Illness Kyle Montgomery is a 45 y.o. male presenting with nausea and vomiting. CT ab/pelvis showed diffuse gas w/ fluid distended colon admitted for Colonic Distention: s/p colonic decompression 10/. He was recently admitted to the [facility] for septic arthritis of the left SI joint. PMH significant for DM2, bipolar d/o, CKD3b, HLD.    PT Comments    Pt appears anxious with mobility, requests rolling in bed to change soiled linen and for pericare, declines to get OOB into recliner due to L hip pain with bed mobility despite education/encouragement and pain medication.  Pt able to perform ankle pumps and heel slides bilaterally without pain complaints, but fatigues reporting having performed them earlier today. Pt sates doesn't ambulate at home, mother and step father transfer him into w/c. Will continue to encourage mobilizing with therapy and educate pt on benefit of mobility and purpose of therapeutic interventions.   Recommendations for follow up therapy are one component of a multi-disciplinary discharge planning process, led by the attending physician.  Recommendations may be updated based on patient status, additional functional criteria and insurance authorization.  Follow Up Recommendations  SNF     Equipment Recommendations  None recommended by PT    Recommendations for Other Services       Precautions / Restrictions Precautions Precautions: Fall Restrictions Weight Bearing Restrictions: No     Mobility  Bed Mobility Overal bed mobility: Needs Assistance Bed Mobility: Rolling Rolling: Min assist  General bed mobility comments: min A to roll R/L for pericare and to change soiled linen under pt, pt declines to get OOB to chair for linen change despite encouragement/education and pain medication; requires increased time to  roll, heavy use of bedrail, max A +2 with bedpad to scoot up in bed    Transfers  General transfer comment: Pt declines due to L hip pain with bed mobility despite encouragement and education  Ambulation/Gait    Stairs             Wheelchair Mobility    Modified Rankin (Stroke Patients Only)       Balance         Cognition Arousal/Alertness: Awake/alert Behavior During Therapy: Flat affect;Anxious Overall Cognitive Status: No family/caregiver present to determine baseline cognitive functioning  General Comments: Pt requires increased time to follow 1 step commands, initially agreeable to get OOB but then declines due to L hip pain with bed mobility despite RN providing pain medication and much encouragement.      Exercises General Exercises - Lower Extremity Ankle Circles/Pumps: Supine;AROM;Right;5 reps Heel Slides: Supine;AROM;Strengthening;Both;5 reps    General Comments        Pertinent Vitals/Pain Pain Assessment: 0-10 Pain Score: 7  Pain Location: L hip Pain Descriptors / Indicators: Aching;Sore Pain Intervention(s): Limited activity within patient's tolerance;Monitored during session;Repositioned;RN gave pain meds during session    Home Living                      Prior Function            PT Goals (current goals can now be found in the care plan section) Acute Rehab PT Goals Patient Stated Goal: heal up and have less pain with mobility PT Goal Formulation: With patient Time For Goal Achievement: 11/10/20 Potential to Achieve Goals: Good Progress towards PT goals: Progressing toward goals (slowly due to  pain, anxious regarding mobility)    Frequency    Min 2X/week      PT Plan Current plan remains appropriate    Co-evaluation              AM-PAC PT "6 Clicks" Mobility   Outcome Measure  Help needed turning from your back to your side while in a flat bed without using bedrails?: A Little Help needed moving from lying  on your back to sitting on the side of a flat bed without using bedrails?: A Little Help needed moving to and from a bed to a chair (including a wheelchair)?: Total Help needed standing up from a chair using your arms (e.g., wheelchair or bedside chair)?: Total Help needed to walk in hospital room?: Total Help needed climbing 3-5 steps with a railing? : Total 6 Click Score: 10    End of Session   Activity Tolerance: Patient limited by pain Patient left: in bed;with call bell/phone within reach;with bed alarm set Nurse Communication: Mobility status;Patient requests pain meds PT Visit Diagnosis: Other abnormalities of gait and mobility (R26.89);Pain Pain - Right/Left: Left Pain - part of body: Hip     Time: 2836-6294 PT Time Calculation (min) (ACUTE ONLY): 28 min  Charges:  $Therapeutic Activity: 23-37 mins                      Tori Hassen Bruun PT, DPT 10/30/20, 2:40 PM

## 2020-10-30 NOTE — Progress Notes (Addendum)
PROGRESS NOTE  Kyle Montgomery GHW:[id number] DOB: 04-05-1975 DOA: 10/23/2020 PCP: Andrey Campanile, MD  HPI/Recap of past 71 hours: 45 year old male with bipolar disorder type 2 diabetes mellitus chronic kidney disease stage IIIb, septic arthritis recently discharged on October 19, 2020 on doxycycline and Augmentin but was readmitted October 3 with nausea vomiting x-ray revealed diffuse colonic dilatation rule out toxic megacolon.  Patient was treated with colonic decompression   Subjective: October 27, 2020: Patient seen and examined at bedside denies any new complaint.  Overnight he had a low blood sugar of 69 that was treated by covering ninth provider  October 28, 2020: Patient seen and examined at bedside He denies any new complain his current blood sugar is normal it is 127 fasting He still on clear liquids he stated he was not ready to advance his diet yet  October 29, 2020: Patient seen and examined at bedside he is doing better no nausea vomiting Still on clear liquids .  October 30, 2020: Patient seen and examined at bedside  patient is still on IV Unasyn and vancomycin Cultures are negative Will begin to de-escalate antibiotics  he wants grits or mashed potatoes , bc soup too salty and pudding is too sweet for him GI recommended for him to continue clear liquid diet however patient has had about 4 episodes of diarrhea this shift. I think he had 3 episodes last night/this morning. GI has changed him to soft diet this evening  Assessment/Plan: Principal Problem:   [facility]): Colonic Active Problems:   Bipolar affective (HCC)   Essential hypertension   CKD (chronic kidney disease), stage III (HCC)   Hyperlipidemia   Septic arthritis (HCC)   SIRS (systemic inflammatory response syndrome) (HCC)   Hypoxia   Hypokalemia   Overweight (BMI 25.0-29.9)  1.  Colonic dilatation up to 10 cm with megacolon Ileus Status post colonic decompression October 25, 2020 Doing  much better Continue to monitor and avoid narcotics 10/27/2020 abdominal x-ray shows Prominent diffuse gaseous distention of the visualized colon, most prominent in the cecum measuring approximately 10.1 cm diameter, increased from 8.5 cm.  2.  Septic arthritis of the left SI joint  Patient stated to have refused MRI patient is still on IV Unasyn and vancomycin Cultures are negative Will begin to de-escalate antibiotics   3.  Hypoxia resolved spontaneously VQ scan was negative for PE  4.  Chronic kidney disease stage III. His creatinine today is 1. 5 3 and is coming down  5.  Hyper natremia.  Sodium today is 146, it was 151 yesterday We will continue IV hydration Patient is still on clear liquids    Code Status: Full  Severity of Illness: The appropriate patient status for this patient is INPATIENT. Inpatient status is judged to be reasonable and necessary in order to provide the required intensity of service to ensure the patient's safety. The patient's presenting symptoms, physical exam findings, and initial radiographic and laboratory data in the context of their chronic comorbidities is felt to place them at high risk for further clinical deterioration. Furthermore, it is not anticipated that the patient will be medically stable for discharge from the hospital within 2 midnights of admission. The following factors support the patient status of inpatient.   " Not medically ready and IV Unasyn   * I certify that at the point of admission it is my clinical judgment that the patient will require inpatient hospital care spanning beyond 2 midnights from the point of  admission due to high intensity of service, high risk for further deterioration and high frequency of surveillance required.*   Family Communication: Patient  Disposition Plan: To be determined Status is: Inpatient   Dispo: The patient is from: Home              Anticipated d/c is to:               Anticipated d/c  date is:               Patient currently not medically stable for discharge  Consultants: Gastroenterology  Procedures: Colonic decompression  Antimicrobials: Unasyn  DVT prophylaxis: Lovenox   Objective: Vitals:   10/29/20 1133 10/29/20 1319 10/29/20 2307 10/30/20 0627  BP: (!) 135/93 (!) 138/101 116/81 117/79  Pulse:  (!) 104 (!) 101 95  Resp:  16 20   Temp:  99 F (37.2 C) 98.3 F (36.8 C) 99 F (37.2 C)  TempSrc:  Oral Oral Oral  SpO2:  93% 94% 96%  Weight:      Height:        Intake/Output Summary (Last 24 hours) at 10/30/2020 0927 Last data filed at 10/30/2020 6644 Gross per 24 hour  Intake 3376 ml  Output 3105 ml  Net 271 ml    Filed Weights   10/24/20 0042 10/25/20 1145  Weight: 91.1 kg 91.1 kg   Body mass index is 28.82 kg/m.  Exam:  General: 45 y.o. year-old male well developed well nourished in no acute distress.  Alert and oriented x3. Cardiovascular: Regular rate and rhythm with no rubs or gallops.  No thyromegaly or JVD noted.   Respiratory: Clear to auscultation with no wheezes or rales. Good inspiratory effort. Abdomen: Slightly distended hypoactive bowel sound Musculoskeletal: No lower extremity edema. 2/4 pulses in all 4 extremities. Skin: No ulcerative lesions noted or rashes, Psychiatry: Mood is appropriate for condition and setting Neurology:    Data Reviewed: CBC: Recent Labs  Lab 10/23/20 1314 10/24/20 0527 10/25/20 0516  WBC 13.3* 14.1* 12.8*  NEUTROABS  --   --  8.7*  HGB 11.4* 12.1* 11.7*  HCT 37.5* 38.0* 37.3*  MCV 90.8 87.4 88.2  PLT 390 409* 451*    Basic Metabolic Panel: Recent Labs  Lab 10/24/20 0527 10/25/20 0516 10/27/20 0529 10/29/20 0549 10/30/20 0528  NA 147* 146* 144 151* 146*  K 3.6 3.9 3.8 3.5 3.9  CL 115* 112* 115* 121* 118*  CO2 23 24 21* 19* 22  GLUCOSE 131* 143* 115* 115* 120*  BUN 37* 33* 32* 22* 19  CREATININE 1.92* 1.91* 1.70* 1.77* 1.53*  CALCIUM 10.2 9.4 9.4 9.6 9.4  MG  --  2.0   --  1.9 1.8    GFR: Estimated Creatinine Clearance: 69.2 mL/min (A) (by C-G formula based on SCr of 1.53 mg/dL (H)). Liver Function Tests: Recent Labs  Lab 10/24/20 0527  AST 29  ALT 42  ALKPHOS 52  BILITOT 0.6  PROT 7.8  ALBUMIN 2.9*    No results for input(s): LIPASE, AMYLASE in the last 168 hours.  No results for input(s): AMMONIA in the last 168 hours. Coagulation Profile: No results for input(s): INR, PROTIME in the last 168 hours. Cardiac Enzymes: No results for input(s): CKTOTAL, CKMB, CKMBINDEX, TROPONINI in the last 168 hours. BNP (last 3 results) No results for input(s): PROBNP in the last 8760 hours. HbA1C: No results for input(s): HGBA1C in the last 72 hours. CBG: Recent Labs  Lab 10/29/20 0721 10/29/20 1207  10/29/20 1552 10/29/20 2304 10/30/20 0750  GLUCAP 134* 108* 89 119* 109*    Lipid Profile: No results for input(s): CHOL, HDL, LDLCALC, TRIG, CHOLHDL, LDLDIRECT in the last 72 hours. Thyroid Function Tests: No results for input(s): TSH, T4TOTAL, FREET4, T3FREE, THYROIDAB in the last 72 hours. Anemia Panel: No results for input(s): VITAMINB12, FOLATE, FERRITIN, TIBC, IRON, RETICCTPCT in the last 72 hours. Urine analysis:    Component Value Date/Time   COLORURINE YELLOW 10/23/2020 0649   APPEARANCEUR CLEAR 10/23/2020 0649   LABSPEC 1.012 10/23/2020 0649   PHURINE 5.0 10/23/2020 0649   GLUCOSEU NEGATIVE 10/23/2020 0649   GLUCOSEU NEG mg/dL 91/79/1505 6979   HGBUR SMALL (A) 10/23/2020 0649   BILIRUBINUR NEGATIVE 10/23/2020 0649   KETONESUR NEGATIVE 10/23/2020 0649   PROTEINUR 100 (A) 10/23/2020 0649   UROBILINOGEN 0.2 01/09/2012 1007   NITRITE NEGATIVE 10/23/2020 0649   LEUKOCYTESUR NEGATIVE 10/23/2020 0649   Sepsis Labs: @LABRCNTIP (procalcitonin:4,lacticidven:4)  ) Recent Results (from the past 240 hour(s))  Culture, blood (routine x 2)     Status: None   Collection Time: 10/23/20  8:15 AM   Specimen: BLOOD  Result Value Ref Range  Status   Specimen Description   Final    BLOOD BLOOD LEFT HAND Performed at [facility], [address] [place], [place]    Special Requests   Final    BOTTLES DRAWN AEROBIC ONLY Blood Culture adequate volume Performed at [facility], [address], [place], [place]    Culture   Final    NO GROWTH 5 DAYS Performed at [facility], [address] [place], [place]    Report Status 10/28/2020 FINAL  Final  Culture, blood (routine x 2)     Status: None   Collection Time: 10/23/20  8:25 AM   Specimen: BLOOD  Result Value Ref Range Status   Specimen Description   Final    BLOOD BLOOD RIGHT WRIST Performed at [facility], [address] [place], [place]    Special Requests   Final    BOTTLES DRAWN AEROBIC ONLY Blood Culture adequate volume Performed at [facility], [address], [place], [place]    Culture   Final    NO GROWTH 5 DAYS Performed at [facility], [address], [place], [place]    Report Status 10/28/2020 FINAL  Final  Resp Panel by RT-PCR (Flu A&B, Covid) Nasopharyngeal Swab     Status: None   Collection Time: 10/23/20  8:25 AM   Specimen: Nasopharyngeal Swab; Nasopharyngeal(NP) swabs in vial transport medium  Result Value Ref Range Status   SARS Coronavirus 2 by RT PCR NEGATIVE NEGATIVE Final    Comment: (NOTE) SARS-CoV-2 target nucleic acids are NOT DETECTED.  The SARS-CoV-2 RNA is generally detectable in upper respiratory specimens during the acute phase of infection. The lowest concentration of SARS-CoV-2 viral copies this assay can detect is 138 copies/mL. A negative result does not preclude SARS-Cov-2 infection and should not be used as the sole basis for treatment or other patient management decisions. A negative result may occur with  improper specimen collection/handling, submission of specimen  other than nasopharyngeal swab, presence of viral mutation(s) within the areas targeted by this assay, and inadequate number of viral copies(<138 copies/mL). A negative result must be combined with clinical observations, patient history, and epidemiological information. The expected result is Negative.  Fact Sheet for Patients:  BloggerCourse.com  Fact Sheet for Healthcare Providers:  SeriousBroker.it  This test is no t yet approved or cleared by the [place] FDA and  has been authorized for detection and/or diagnosis of SARS-CoV-2 by FDA under an Emergency Use Authorization (EUA). This EUA will remain  in effect (meaning this test can be used) for the duration of the COVID-19 declaration under Section 564(b)(1) of the Act, 21 U.S.C.section 360bbb-3(b)(1), unless the authorization is terminated  or revoked sooner.       Influenza A by PCR NEGATIVE NEGATIVE Final   Influenza B by PCR NEGATIVE NEGATIVE Final    Comment: (NOTE) The Xpert Xpress SARS-CoV-2/FLU/RSV plus assay is intended as an aid in the diagnosis of influenza from Nasopharyngeal swab specimens and should not be used as a sole basis for treatment. Nasal washings and aspirates are unacceptable for Xpert Xpress SARS-CoV-2/FLU/RSV testing.  Fact Sheet for Patients: BloggerCourse.com  Fact Sheet for Healthcare Providers: SeriousBroker.it  This test is not yet approved or cleared by the [place] FDA and has been authorized for detection and/or diagnosis of SARS-CoV-2 by FDA under an Emergency Use Authorization (EUA). This EUA will remain in effect (meaning this test can be used) for the duration of the COVID-19 declaration under Section 564(b)(1) of the Act, 21 U.S.C. section 360bbb-3(b)(1), unless the authorization is terminated or revoked.  Performed at [facility], [address], [place], [place] [address]       Studies: No results found.  Scheduled Meds:  amLODipine  10 mg Oral Daily   divalproex  500 mg Oral QHS   enoxaparin (LOVENOX) injection  40 mg Subcutaneous Q24H   gabapentin  300 mg Oral QHS   insulin aspart  0-9 Units Subcutaneous TID WC   lisinopril  40 mg Oral Daily   mouth rinse  15 mL Mouth Rinse BID   polyethylene glycol  17 g Oral BID   psyllium  1 packet Oral BID   ziprasidone  80 mg Oral BID    Continuous Infusions:  ampicillin-sulbactam (UNASYN) IV 3 g (10/30/20 0802)   dextrose 5 % and 0.45 % NaCl with KCl 40 mEq/L 125 mL/hr at 10/30/20 0637   vancomycin Stopped (10/29/20 2131)     LOS: 7 days     Myrtie Neither, MD [facility]  To reach me or the doctor on call, go to: [email or web address] Password [email or web address]  10/30/2020, 9:27 AM

## 2020-10-30 NOTE — Progress Notes (Signed)
Pharmacy Antibiotic Note  Kyle Montgomery is a 45 y.o. male admitted on 10/23/2020 with hip pain & emesis.  Pharmacy was initially consulted for vancomycin and ampicillin/sulbactam dosing for concern for aspiration PNA, and recent possible joint infection. Patient was prescribed Augmentin & doxycycline PTA.    10/30/2020, Today Today is day #8 of IV antibiotics - will discontinue vancomycin per discussion with MD, BCx finalized with no growth.   -SCr 1.53, CrCl = 69 mL/min -Afebrile  Plan: Discontinue vancomycin Continue ampicillin/sulbactam 3 g IV q6h SCr improved, CrCl has remained > 30 mL/min. Pharmacy to sign off.   Height: 5\' 10"  (177.8 cm) Weight: 91.1 kg (200 lb 13.4 oz) IBW/kg (Calculated) : 73  Temp (24hrs), Avg:98.8 F (37.1 C), Min:98.3 F (36.8 C), Max:99 F (37.2 C)  Recent Labs  Lab 10/23/20 1035 10/23/20 1314 10/23/20 1314 10/24/20 0527 10/25/20 0516 10/27/20 0529 10/27/20 1231 10/29/20 0549 10/30/20 0528  WBC  --  13.3*  --  14.1* 12.8*  --   --   --   --   CREATININE  --  1.99*   < > 1.92* 1.91* 1.70*  --  1.77* 1.53*  LATICACIDVEN 1.2  --   --   --   --   --   --   --   --   VANCOTROUGH  --   --   --   --   --   --  23*  --   --    < > = values in this interval not displayed.     Estimated Creatinine Clearance: 69.2 mL/min (A) (by C-G formula based on SCr of 1.53 mg/dL (H)).    No Known Allergies  Antimicrobials this admission: 10/3 Unasyn >>  10/3 vancomycin >> 10/10  Microbiology results: 10/3 BCx: No growth FINAL   12/3, PharmD 10/30/20 9:36 AM

## 2020-10-30 NOTE — Progress Notes (Signed)
Given orange juice for blood glucose of 77 at bedtime.

## 2020-10-31 DIAGNOSIS — K567 Ileus, unspecified: Secondary | ICD-10-CM | POA: Diagnosis not present

## 2020-10-31 LAB — GLUCOSE, CAPILLARY
Glucose-Capillary: 116 mg/dL — ABNORMAL HIGH (ref 70–99)
Glucose-Capillary: 99 mg/dL (ref 70–99)
Glucose-Capillary: 133 mg/dL — ABNORMAL HIGH (ref 70–99)
Glucose-Capillary: 124 mg/dL — ABNORMAL HIGH (ref 70–99)

## 2020-10-31 NOTE — Progress Notes (Signed)
Subjective: Patient lying in bed in very good spirits. Has been able to transfer to wheelchair, doing exercises in bed. Patient has had 3 total loose/diarrhea stools, last one yesterday. No melena and no hematochezia.  Patient started on soft diet yesterday and doing very well.  Objective: Vital signs in last 24 hours: Temp:  [98.6 F (37 C)-99.5 F (37.5 C)] 99.5 F (37.5 C) (10/11 0430) Pulse Rate:  [95-97] 97 (10/11 0430) Resp:  [16] 16 (10/11 0430) BP: (122-127)/(74-83) 122/83 (10/11 0430) SpO2:  [93 %-96 %] 93 % (10/11 0430) Weight change:  Last BM Date: 10/30/20  PE: Not in distress, lying on bed GENERAL: Able to speak in full sentences, ABDOMEN:Soft abdomen, normal bowel sounds EXTREMITIES: no edema  Lab Results: Results for orders placed or performed during the hospital encounter of 10/23/20 (from the past 48 hour(s))  Glucose, capillary     Status: Abnormal   Collection Time: 10/29/20 12:07 PM  Result Value Ref Range   Glucose-Capillary 108 (H) 70 - 99 mg/dL    Comment: Glucose reference range applies only to samples taken after fasting for at least 8 hours.  Glucose, capillary     Status: None   Collection Time: 10/29/20  3:52 PM  Result Value Ref Range   Glucose-Capillary 89 70 - 99 mg/dL    Comment: Glucose reference range applies only to samples taken after fasting for at least 8 hours.  Glucose, capillary     Status: Abnormal   Collection Time: 10/29/20 11:04 PM  Result Value Ref Range   Glucose-Capillary 119 (H) 70 - 99 mg/dL    Comment: Glucose reference range applies only to samples taken after fasting for at least 8 hours.  Basic metabolic panel     Status: Abnormal   Collection Time: 10/30/20  5:28 AM  Result Value Ref Range   Sodium 146 (H) 135 - 145 mmol/L   Potassium 3.9 3.5 - 5.1 mmol/L   Chloride 118 (H) 98 - 111 mmol/L   CO2 22 22 - 32 mmol/L   Glucose, Bld 120 (H) 70 - 99 mg/dL    Comment: Glucose reference range applies only to samples  taken after fasting for at least 8 hours.   BUN 19 6 - 20 mg/dL   Creatinine, Ser 1.61 (H) 0.61 - 1.24 mg/dL   Calcium 9.4 8.9 - 09.6 mg/dL   GFR, Estimated 57 (L) >60 mL/min    Comment: (NOTE) Calculated using the CKD-EPI Creatinine Equation (2021)    Anion gap 6 5 - 15    Comment: Performed at [facility], [address], [place], [place] [address]  Magnesium     Status: None   Collection Time: 10/30/20  5:28 AM  Result Value Ref Range   Magnesium 1.8 1.7 - 2.4 mg/dL    Comment: Performed at [facility], [address], [place], [place] [address]  Glucose, capillary     Status: Abnormal   Collection Time: 10/30/20  7:50 AM  Result Value Ref Range   Glucose-Capillary 109 (H) 70 - 99 mg/dL    Comment: Glucose reference range applies only to samples taken after fasting for at least 8 hours.  Glucose, capillary     Status: Abnormal   Collection Time: 10/30/20 11:16 AM  Result Value Ref Range   Glucose-Capillary 102 (H) 70 - 99 mg/dL    Comment: Glucose reference range applies only to samples taken after fasting for at least 8 hours.  Glucose, capillary  Status: Abnormal   Collection Time: 10/30/20  5:13 PM  Result Value Ref Range   Glucose-Capillary 157 (H) 70 - 99 mg/dL    Comment: Glucose reference range applies only to samples taken after fasting for at least 8 hours.  Glucose, capillary     Status: None   Collection Time: 10/30/20  9:20 PM  Result Value Ref Range   Glucose-Capillary 77 70 - 99 mg/dL    Comment: Glucose reference range applies only to samples taken after fasting for at least 8 hours.  Glucose, capillary     Status: Abnormal   Collection Time: 10/31/20  8:04 AM  Result Value Ref Range   Glucose-Capillary 116 (H) 70 - 99 mg/dL    Comment: Glucose reference range applies only to samples taken after fasting for at least 8 hours.  Glucose, capillary     Status: None   Collection Time: 10/31/20 11:36 AM  Result Value Ref  Range   Glucose-Capillary 99 70 - 99 mg/dL    Comment: Glucose reference range applies only to samples taken after fasting for at least 8 hours.    Studies/Results: No results found.  Medications: I have reviewed the patient's current medications.  Assessment: Colonic ileus, status post decompression on 10/25/2020  Plan: Patient with bowel movement yesterday. Patient started on soft diet yesterday and tolerating well Minimize narcotics Mobilization, out of bed to chair. Patient being discharged today, will set up for outpatient evaluation with her office.  Patient with poor prep on colonoscopy, will need repeat at sometime in the future.   Doree Albee, PA-C 10/31/2020, 11:39 AM

## 2020-10-31 NOTE — Progress Notes (Signed)
PROGRESS NOTE  Kyle Montgomery NIO:[id number] DOB: 09-10-1975 DOA: 10/23/2020 PCP: Andrey Campanile, MD  HPI/Recap of past 68 hours: 45 year old male with bipolar disorder type 2 diabetes mellitus chronic kidney disease stage IIIb, septic arthritis recently discharged on October 19, 2020 on doxycycline and Augmentin but was readmitted October 3 with nausea vomiting x-ray revealed diffuse colonic dilatation rule out toxic megacolon.  Patient was treated with colonic decompression   Subjective: October 27, 2020: Patient seen and examined at bedside denies any new complaint.  Overnight he had a low blood sugar of 69 that was treated by covering ninth provider  October 28, 2020: Patient seen and examined at bedside He denies any new complain his current blood sugar is normal it is 127 fasting He still on clear liquids he stated he was not ready to advance his diet yet  October 29, 2020: Patient seen and examined at bedside he is doing better no nausea vomiting Still on clear liquids .  October 30, 2020: Patient seen and examined at bedside  patient is still on IV Unasyn and vancomycin Cultures are negative Will begin to de-escalate antibiotics  he wants grits or mashed potatoes , bc soup too salty and pudding is too sweet for him GI recommended for him to continue clear liquid diet however patient has had about 4 episodes of diarrhea this shift. I think he had 3 episodes last night/this morning. GI has changed him to soft diet this evening  October 31, 2020: Patient continued to improve He still on vancomycin which will stop tomorrow Patient was recommended for SNF but he does not want to go to SNF his mother also agreeable to take him back home.  Patient was bedbound prior to admission He tolerated his feeding well is moved onto soft diet today. He has had some diarrhea most likely from being on MiraLAX so we will reduce his MiraLAX or discontinue it today Patient is likely being  discharged tomorrow to his home they will require ambulance he said  Assessment/Plan: Principal Problem:   [facility]): Colonic Active Problems:   Bipolar affective (HCC)   Essential hypertension   CKD (chronic kidney disease), stage III (HCC)   Hyperlipidemia   Septic arthritis (HCC)   SIRS (systemic inflammatory response syndrome) (HCC)   Hypoxia   Hypokalemia   Overweight (BMI 25.0-29.9)  1.  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  2.  Septic arthritis of the left SI joint  Patient stated to have refused MRI patient is still on IV Unasyn and vancomycin Cultures are negative Will begin to de-escalate antibiotics   3.  Hypoxia resolved spontaneously VQ scan was negative for PE  4.  Chronic kidney disease stage III. His creatinine today is 1. 5 3 and is coming down  5.  Hyper natremia.  Sodium today is 146, it was 151 yesterday We will continue IV hydration Patient is still on clear liquids    Code Status: Full  Severity of Illness: The appropriate patient status for this patient is INPATIENT. Inpatient status is judged to be reasonable and necessary in order to provide the required intensity of service to ensure the patient's safety. The patient's presenting symptoms, physical exam findings, and initial radiographic and  laboratory data in the context of their chronic comorbidities is felt to place them at high risk for further clinical deterioration. Furthermore, it is not anticipated that the patient will be medically stable for discharge from the hospital within 2 midnights of admission. The following factors support the patient status of inpatient.   " Not  medically ready and IV Unasyn   * I certify that at the point of admission it is my clinical judgment that the patient will require inpatient hospital care spanning beyond 2 midnights from the point of admission due to high intensity of service, high risk for further deterioration and high frequency of surveillance required.*   Family Communication: Patient  Disposition Plan: To be determined Status is: Inpatient   Dispo: The patient is from: Home              Anticipated d/c is to:               Anticipated d/c date is:               Patient currently not medically stable for discharge  Consultants: Gastroenterology  Procedures: Colonic decompression  Antimicrobials: Unasyn  DVT prophylaxis: Lovenox   Objective: Vitals:   10/30/20 1401 10/30/20 2123 10/31/20 0430 10/31/20 1356  BP: 122/74 127/79 122/83 126/78  Pulse: 95 95 97 (!) 109  Resp: 16 16 16 16   Temp: 98.6 F (37 C) 99.4 F (37.4 C) 99.5 F (37.5 C) 100 F (37.8 C)  TempSrc: Oral Oral Oral Oral  SpO2: 96% 95% 93% 95%  Weight:      Height:        Intake/Output Summary (Last 24 hours) at 10/31/2020 1609 Last data filed at 10/31/2020 1357 Gross per 24 hour  Intake 3414.26 ml  Output 4175 ml  Net -760.74 ml    Filed Weights   10/24/20 0042 10/25/20 1145  Weight: 91.1 kg 91.1 kg   Body mass index is 28.82 kg/m.  Exam:  General: 45 y.o. year-old male well developed well nourished in no acute distress.  Alert and oriented x3. Cardiovascular: Regular rate and rhythm with no rubs or gallops.  No thyromegaly or JVD noted.   Respiratory: Clear to auscultation with no wheezes or rales. Good inspiratory effort. Abdomen: Slightly distended hypoactive bowel sound Musculoskeletal: No lower extremity edema. 2/4 pulses in all 4 extremities. Skin: No ulcerative lesions noted or rashes, Psychiatry: Mood is appropriate for condition and setting Neurology:    Data Reviewed: CBC: Recent Labs  Lab  10/25/20 0516  WBC 12.8*  NEUTROABS 8.7*  HGB 11.7*  HCT 37.3*  MCV 88.2  PLT 451*    Basic Metabolic Panel: Recent Labs  Lab 10/25/20 0516 10/27/20 0529 10/29/20 0549 10/30/20 0528  NA 146* 144 151* 146*  K 3.9 3.8 3.5 3.9  CL 112* 115* 121* 118*  CO2 24 21* 19* 22  GLUCOSE 143* 115* 115* 120*  BUN 33* 32* 22* 19  CREATININE 1.91* 1.70* 1.77* 1.53*  CALCIUM 9.4 9.4 9.6 9.4  MG 2.0  --  1.9 1.8    GFR: Estimated Creatinine Clearance: 69.2 mL/min (A) (by C-G formula based on SCr of 1.53 mg/dL (H)). Liver Function Tests: No results for input(s): AST, ALT, ALKPHOS, BILITOT, PROT, ALBUMIN in the last 168 hours.  No results for input(s): LIPASE, AMYLASE in the last 168 hours.  No results for input(s): AMMONIA in the last 168 hours. Coagulation Profile: No results for input(s): INR, PROTIME in the last 168 hours.  Cardiac Enzymes: No results for input(s): CKTOTAL, CKMB, CKMBINDEX, TROPONINI in the last 168 hours. BNP (last 3 results) No results for input(s): PROBNP in the last 8760 hours. HbA1C: No results for input(s): HGBA1C in the last 72 hours. CBG: Recent Labs  Lab 10/30/20 1116 10/30/20 1713 10/30/20 2120 10/31/20 0804 10/31/20 1136  GLUCAP 102* 157* 77 116* 99    Lipid Profile: No results for input(s): CHOL, HDL, LDLCALC, TRIG, CHOLHDL, LDLDIRECT in the last 72 hours. Thyroid Function Tests: No results for input(s): TSH, T4TOTAL, FREET4, T3FREE, THYROIDAB in the last 72 hours. Anemia Panel: No results for input(s): VITAMINB12, FOLATE, FERRITIN, TIBC, IRON, RETICCTPCT in the last 72 hours. Urine analysis:    Component Value Date/Time   COLORURINE YELLOW 10/23/2020 0649   APPEARANCEUR CLEAR 10/23/2020 0649   LABSPEC 1.012 10/23/2020 0649   PHURINE 5.0 10/23/2020 0649   GLUCOSEU NEGATIVE 10/23/2020 0649   GLUCOSEU NEG mg/dL 16/07/3708 6269   HGBUR SMALL (A) 10/23/2020 0649   BILIRUBINUR NEGATIVE 10/23/2020 0649   KETONESUR NEGATIVE 10/23/2020 0649    PROTEINUR 100 (A) 10/23/2020 0649   UROBILINOGEN 0.2 01/09/2012 1007   NITRITE NEGATIVE 10/23/2020 0649   LEUKOCYTESUR NEGATIVE 10/23/2020 0649   Sepsis Labs: @LABRCNTIP (procalcitonin:4,lacticidven:4)  ) Recent Results (from the past 240 hour(s))  Culture, blood (routine x 2)     Status: None   Collection Time: 10/23/20  8:15 AM   Specimen: BLOOD  Result Value Ref Range Status   Specimen Description   Final    BLOOD BLOOD LEFT HAND Performed at [facility], [address], [place], [place]    Special Requests   Final    BOTTLES DRAWN AEROBIC ONLY Blood Culture adequate volume Performed at [facility], [address], [place], [place]    Culture   Final    NO GROWTH 5 DAYS Performed at [facility], [address], [place], [place]    Report Status 10/28/2020 FINAL  Final  Culture, blood (routine x 2)     Status: None   Collection Time: 10/23/20  8:25 AM   Specimen: BLOOD  Result Value Ref Range Status   Specimen Description   Final    BLOOD BLOOD RIGHT WRIST Performed at [facility], [address], [place], [place]    Special Requests   Final    BOTTLES DRAWN AEROBIC ONLY Blood Culture adequate volume Performed at [facility], [address] [place], [place]    Culture   Final    NO GROWTH 5 DAYS Performed at [facility], [address] [place], [place]    Report Status 10/28/2020 FINAL  Final  Resp Panel by RT-PCR (Flu A&B, Covid) Nasopharyngeal Swab     Status: None   Collection Time: 10/23/20  8:25 AM   Specimen: Nasopharyngeal Swab; Nasopharyngeal(NP) swabs in vial transport medium  Result Value Ref Range Status   SARS Coronavirus 2 by RT PCR NEGATIVE NEGATIVE Final    Comment: (NOTE) SARS-CoV-2 target nucleic acids are NOT DETECTED.  The SARS-CoV-2 RNA is generally detectable in upper  respiratory specimens during the acute phase of infection. The lowest concentration of SARS-CoV-2 viral copies this assay can detect is 138 copies/mL. A negative result does not preclude SARS-Cov-2 infection and should not be used as the sole basis for treatment or other patient management decisions. A negative result may occur with  improper specimen collection/handling,  submission of specimen other than nasopharyngeal swab, presence of viral mutation(s) within the areas targeted by this assay, and inadequate number of viral copies(<138 copies/mL). A negative result must be combined with clinical observations, patient history, and epidemiological information. The expected result is Negative.  Fact Sheet for Patients:  BloggerCourse.com  Fact Sheet for Healthcare Providers:  SeriousBroker.it  This test is no t yet approved or cleared by the [place] FDA and  has been authorized for detection and/or diagnosis of SARS-CoV-2 by FDA under an Emergency Use Authorization (EUA). This EUA will remain  in effect (meaning this test can be used) for the duration of the COVID-19 declaration under Section 564(b)(1) of the Act, 21 U.S.C.section 360bbb-3(b)(1), unless the authorization is terminated  or revoked sooner.       Influenza A by PCR NEGATIVE NEGATIVE Final   Influenza B by PCR NEGATIVE NEGATIVE Final    Comment: (NOTE) The Xpert Xpress SARS-CoV-2/FLU/RSV plus assay is intended as an aid in the diagnosis of influenza from Nasopharyngeal swab specimens and should not be used as a sole basis for treatment. Nasal washings and aspirates are unacceptable for Xpert Xpress SARS-CoV-2/FLU/RSV testing.  Fact Sheet for Patients: BloggerCourse.com  Fact Sheet for Healthcare Providers: SeriousBroker.it  This test is not yet approved or cleared by the [place] FDA and has been  authorized for detection and/or diagnosis of SARS-CoV-2 by FDA under an Emergency Use Authorization (EUA). This EUA will remain in effect (meaning this test can be used) for the duration of the COVID-19 declaration under Section 564(b)(1) of the Act, 21 U.S.C. section 360bbb-3(b)(1), unless the authorization is terminated or revoked.  Performed at [facility], [address] [place], [place] [address]       Studies: No results found.  Scheduled Meds:  amLODipine  10 mg Oral Daily   divalproex  500 mg Oral QHS   enoxaparin (LOVENOX) injection  40 mg Subcutaneous Q24H   gabapentin  300 mg Oral QHS   insulin aspart  0-9 Units Subcutaneous TID WC   lisinopril  40 mg Oral Daily   mouth rinse  15 mL Mouth Rinse BID   ziprasidone  80 mg Oral BID    Continuous Infusions:  ampicillin-sulbactam (UNASYN) IV 3 g (10/31/20 1409)   dextrose 5 % and 0.45 % NaCl with KCl 40 mEq/L 125 mL/hr at 10/31/20 0817     LOS: 8 days     Myrtie Neither, MD [facility]  To reach me or the doctor on call, go to: [email or web address] Password [email or web address]  10/31/2020, 4:09 PM

## 2020-10-31 NOTE — TOC Progression Note (Signed)
Transition of Care [facility]) - Progression Note    Patient Details  Name: LOVELACE CERVENY MRN: [id number] Date of Birth: Nov 18, 1975  [facility] CM/SW Contact  Mieczyslaw Stamas, Meriam Sprague, RN Phone Number: 10/31/2020, 10:10 AM  Clinical Narrative:    Spoke with pt yesterday at bedside for dc planning. PT recommendation for SNF explained to pt. He states that he is going back home with his mother. Pt agreed that I could call his mother to confirm plans with her as she cares for him. Voicemail left for pt mother x 2. Will await return call.  Addendum:  Spoke with mother for dc planning and she states that "if Xion wants to come home that is fine". She states she will continue to care for him at dc. She was informed that pt is not getting out of the bed right now at all and that he refused MRI several days ago. She agrees to [place] coming for HHPT at [facility].   Expected Discharge Plan: Home w Home Health Services Barriers to Discharge: Continued Medical Work up  Expected Discharge Plan and Services Expected Discharge Plan: Home w Home Health Services   Discharge Planning Services: CM Consult   Living arrangements for the past 2 months: Single Family Home Expected Discharge Date:  (unknown)                  Readmission Risk Interventions Readmission Risk Prevention Plan 10/27/2020  Transportation Screening Complete  PCP or Specialist Appt within 3-5 Days Complete  HRI or Home Care Consult Complete  Social Work Consult for Recovery Care Planning/Counseling Complete  Some recent data might be hidden

## 2020-11-01 DIAGNOSIS — F3170 Bipolar disorder, currently in remission, most recent episode unspecified: Secondary | ICD-10-CM | POA: Diagnosis not present

## 2020-11-01 DIAGNOSIS — K567 Ileus, unspecified: Secondary | ICD-10-CM | POA: Diagnosis not present

## 2020-11-01 DIAGNOSIS — N1831 Chronic kidney disease, stage 3a: Secondary | ICD-10-CM | POA: Diagnosis not present

## 2020-11-01 DIAGNOSIS — I10 Essential (primary) hypertension: Secondary | ICD-10-CM | POA: Diagnosis not present

## 2020-11-01 LAB — GLUCOSE, CAPILLARY
Glucose-Capillary: 116 mg/dL — ABNORMAL HIGH (ref 70–99)
Glucose-Capillary: 97 mg/dL (ref 70–99)

## 2020-11-01 MED ORDER — METOPROLOL TARTRATE 25 MG PO TABS: 25.0000 mg | ORAL_TABLET | Freq: Two times a day (BID) | ORAL | 11 refills | Status: DC

## 2020-11-01 MED ORDER — POLYETHYLENE GLYCOL 3350 17 G PO PACK: 17.0000 g | PACK | Freq: Every day | ORAL | 0 refills | Status: DC | PRN

## 2020-11-01 NOTE — Progress Notes (Addendum)
Called pts mother Sherlene. No answer, voicemail left informing that pt will be discharged today. Awaiting PTAR.  AVS reviewed with pt. Pt voiced understanding.  AVS placed in discharge packet. Will give to PTAR to give to pts mother when home.

## 2020-11-01 NOTE — Discharge Summary (Signed)
Physician Discharge Summary  Kyle Montgomery ZOX:[id number] DOB: 04/18/1975 DOA: 10/23/2020  PCP: Andrey Campanile, MD  Admit date: 10/23/2020 Discharge date: 11/01/2020  Admitted From: Home Disposition: Home  Recommendations for Outpatient Follow-up:  Follow up with PCP in 1-2 weeks Please obtain BMP/CBC in one week Follow-up with [facility], Dr. Marca Ancona in the next 3 to 4 weeks  Home Health: Seen by physical therapy with recommendation for skilled nursing facility placement, but patient and his mother have elected to discharge patient home Equipment/Devices:  Discharge Condition: Stable CODE STATUS: Full code Diet recommendation: Heart healthy  Brief/Interim Summary: 45 year old male with history of bipolar disorder, type 2 diabetes, chronic kidney disease stage IIIb, recent admission for possible septic arthritis, was discharged with doxycycline and Augmentin on 9/29, readmitted on 10/3 with nausea, vomiting.  Abdominal x-ray revealed diffuse colonic dilatation with concerns for toxic megacolon.  He was seen by GI and underwent colonic decompression.  Discharge Diagnoses:  Principal Problem:   Ileus [facility]): Colonic Active Problems:   Bipolar affective (HCC)   Essential hypertension   CKD (chronic kidney disease), stage III (HCC)   Hyperlipidemia   Septic arthritis (HCC)   SIRS (systemic inflammatory response syndrome) (HCC)   Hypoxia   Hypokalemia   Overweight (BMI 25.0-29.9)  Colonic dilatation up to 10 cm megacolon/ileus -Status post decompression on 10/25/2020 -Since that time, patient significantly improved -He is now having regular bowel movements -Continue on MiraLAX -Avoid narcotics  Recent admission for possible septic arthritis of left SI joint -Patient continues to refuse further evaluation by MRI -He was recently discharged on doxycycline and Augmentin -He was treated in the hospital with Unasyn and vancomycin -Reasonable to resume prior antibiotics on  discharge until complete  Chronic kidney disease stage IIIa -Creatinine is currently stable at 1.3, and is actually trending down from prior values earlier this month.  Discharge Instructions  Discharge Instructions     Diet - low sodium heart healthy   Complete by: As directed    Increase activity slowly   Complete by: As directed       Allergies as of 11/01/2020   No Known Allergies      Medication List     STOP taking these medications    lisinopril-hydrochlorothiazide 20-12.5 MG tablet Commonly known as: ZESTORETIC   oxyCODONE 5 MG immediate release tablet Commonly known as: Oxy IR/ROXICODONE       TAKE these medications    acetaminophen 325 MG tablet Commonly known as: Tylenol Take 2 tablets (650 mg total) by mouth every 6 (six) hours as needed. What changed: reasons to take this   amLODipine 10 MG tablet Commonly known as: NORVASC Take 1 tablet (10 mg total) by mouth daily.   amoxicillin-clavulanate 875-125 MG tablet Commonly known as: AUGMENTIN Take 1 tablet by mouth every 12 (twelve) hours.   atorvastatin 40 MG tablet Commonly known as: LIPITOR Take 1 tablet (40 mg total) by mouth daily.   divalproex 500 MG 24 hr tablet Commonly known as: DEPAKOTE ER Take 1 tablet (500 mg total) by mouth at bedtime.   doxycycline 100 MG tablet Commonly known as: VIBRA-TABS Take 1 tablet (100 mg total) by mouth every 12 (twelve) hours.   gabapentin 300 MG capsule Commonly known as: NEURONTIN Take 1 capsule (300 mg total) by mouth at bedtime.   melatonin 3 MG Tabs tablet Take 1 tablet (3 mg total) by mouth at bedtime as needed.   metoprolol tartrate 25 MG tablet Commonly known as: LOPRESSOR  Take 1 tablet (25 mg total) by mouth 2 (two) times daily.   polyethylene glycol 17 g packet Commonly known as: MIRALAX / GLYCOLAX Take 17 g by mouth daily as needed for mild constipation.   ziprasidone 80 MG capsule Commonly known as: GEODON Take 1 capsule (80 mg  total) by mouth 2 (two) times daily.        Follow-up Information     Kerin Salen, MD Follow up.   Specialty: Gastroenterology Why: call for appointment in 2-3 weeks Contact information: [address] [place] [address] [phone]         Andrey Campanile, MD. Schedule an appointment as soon as possible for a visit in 2 week(s).   Specialty: Internal Medicine Contact information: [address] [place] [address] [phone]         Care, [facility] Follow up.   Specialty: Home Health Services Contact information: [address] [place] [address] [phone]                No Known Allergies  Consultations: Gastroenterology   Procedures/Studies: CT ABDOMEN PELVIS WO CONTRAST  Result Date: 10/23/2020 CLINICAL DATA:  Nausea and vomiting. Recent diagnosis of left hip infection EXAM: CT ABDOMEN AND PELVIS WITHOUT CONTRAST TECHNIQUE: Multidetector CT imaging of the abdomen and pelvis was performed following the standard protocol without IV contrast. COMPARISON:  None. FINDINGS: Lower chest:  Atelectasis at the lung bases.  Gynecomastia. Hepatobiliary: No focal liver abnormality.Layering gallbladder calculi. No pericholecystic inflammation or bile duct distention Pancreas: Unremarkable. Spleen: Unremarkable. Adrenals/Urinary Tract: Negative adrenals. Cystic density at the upper pole right kidney measuring up to 4.5 cm. Posteriorly there is vague increased density and accentuated fat stranding is seen around the right upper pole . negative bladder. Stomach/Bowel: Diffuse distention of the colon by gas and fluid levels. No bowel wall thickening or obstructive changes. Vascular/Lymphatic: No acute vascular abnormality. No mass or adenopathy. Reproductive:No pathologic findings. Other: No ascites or pneumoperitoneum. Fatty enlargement of the bilateral inguinal canal. Musculoskeletal: No acute abnormalities. Sacroiliac  erosions. Stable sclerotic lesion in the left ilium. IMPRESSION: 1. Diarrheal state with diffuse gas and fluid distended colon. 2. Cystic density in the upper pole right kidney which may contain hemorrhage or proteinaceous contents posteriorly. There is asymmetric stranding around the right kidney, please correlate for signs of ascending infection. Recommend follow-up of the cyst with either enhanced CT or MRI. 3. Cholelithiasis. Electronically Signed   By: Tiburcio Pea M.D.   On: 10/23/2020 07:09   DG Abd 1 View  Result Date: 10/25/2020 CLINICAL DATA:  Megacolon/colonic ileus EXAM: ABDOMEN - 1 VIEW COMPARISON:  Radiograph dated October 25, 2020 at 05:05 FINDINGS: Megacolon/colonic ileus cecum measures up to 5.5 cm, previously 10.0 cm. Other areas of the colon image 80 similar to slightly decreased gaseous distension. Scattered gas-filled loops of small bowel which are nondilated. No definite evidence of free air, although evaluation is limited given supine technique. No acute osseous abnormality. IMPRESSION: Decreased gaseous distention of the colon when compared with prior radiograph. Electronically Signed   By: Allegra Lai M.D.   On: 10/25/2020 13:41   CT PELVIS WO CONTRAST  Result Date: 10/13/2020 CLINICAL DATA:  Pelvic fracture hx of bone lesion, has L hip pain EXAM: CT PELVIS WITHOUT CONTRAST TECHNIQUE: Multidetector CT imaging of the pelvis was performed following the standard protocol without intravenous contrast. COMPARISON:  Radiographs earlier today.  Pelvis CT 03/31/2016 FINDINGS: Urinary Tract: Distal ureters  are decompressed. Unremarkable urinary bladder. Bowel: Moderate stool in the rectosigmoid colon. Normal appendix. No bowel inflammation. Vascular/Lymphatic: No pelvic adenopathy. Atherosclerosis of the iliac arteries. Reproductive:  Unremarkable prostate. Other:  Bilateral fat containing inguinal hernias. Musculoskeletal: Sclerotic focus in the left iliac bone measures 2.6 x 1.2 cm,  previously 2.4 x 1.3 cm on 2018 exam. Margins are lobulated. There is some adjacent sclerosis of the left sacroiliac joint with sacroiliac joint erosions. It is unclear if this lesion is related to the sacroiliac joint changes or there is separate sacroiliitis. No fracture is seen. No new or other focal bone lesions. IMPRESSION: 1. Sclerotic focus in the left iliac bone has marginally increased in size over the past 2 years. There is some adjacent sclerosis of the left sacroiliac joint with sacroiliac joint erosions. It is unclear if this lesion is related to the sacroiliac joint changes or there is separate sacroiliitis. Recommend clinical correlation for signs or symptoms of inflammatory arthropathy. 2. No fracture. 3. Bilateral fat containing inguinal hernias. Aortic Atherosclerosis (ICD10-I70.0). Electronically Signed   By: Narda Rutherford M.D.   On: 10/13/2020 22:09   NM Pulmonary Perfusion  Result Date: 10/25/2020 CLINICAL DATA:  PE suspected, high probability. Clinical concern for aspiration pneumonia. Renal insufficiency. EXAM: NUCLEAR MEDICINE PERFUSION LUNG SCAN TECHNIQUE: Perfusion images were obtained in multiple projections after intravenous injection of radiopharmaceutical. Ventilation scans intentionally deferred if perfusion scan and chest x-ray adequate for interpretation during COVID 19 epidemic. RADIOPHARMACEUTICALS:  4.4 mCi Tc-82m MAA IV COMPARISON:  Chest radiographs earlier today and 10/23/2020. Abdominal CT 10/23/2020. FINDINGS: Persistent asymmetric elevation of the right hemidiaphragm with associated patchy hypo ventilation at the right lung base, corresponding with atelectasis on radiographs. No segmental perfusion defects are identified to suggest pulmonary embolism. IMPRESSION: Right basilar atelectasis. No perfusion defects suspicious for pulmonary embolism. Electronically Signed   By: Carey Bullocks M.D.   On: 10/25/2020 13:46   MR HIP LEFT WO CONTRAST  Result Date:  10/14/2020 CLINICAL DATA:  Septic arthritis suspected, hip, xray done. Left hip pain. Elevated serum inflammatory markers. EXAM: MR OF THE LEFT HIP WITHOUT CONTRAST TECHNIQUE: Multiplanar, multisequence MR imaging was performed. No intravenous contrast was administered. COMPARISON:  X-ray 10/13/2020, CT 10/13/2020 and 03/31/2016 FINDINGS: Bones: No acute fracture. No dislocation. No femoral head avascular necrosis. Very low T1 and T2 signal nonexpansile bone lesion within the left iliac bone posteriorly closely approximating the mid left sacroiliac joint. There is adjacent bone marrow edema within the ileum (series 3, images 6-9). Mild arthropathy of the left SI joint with subtle erosions. Trace amount of fluid within the left SI joint. There also small right-sided facet joint effusions at L4-5 and L5-S1 seen at the edge of the field-of-view with adjacent soft tissue edema. The L4-5 and L5-S1 intervertebral discs are normal in appearance. Articular cartilage and labrum Articular cartilage: No cartilage defect. No subchondral marrow signal changes. Labrum:  Intact.  No paralabral cyst. Joint or bursal effusion Joint effusion:  None. Bursae: No abnormal bursal fluid collection. Muscles and tendons Muscles and tendons: The gluteal, hamstring, iliopsoas, rectus femoris, and adductor tendons appear intact without tear or significant tendinosis. Intramuscular edema within the lower posterior paraspinal musculature more pronounced on the right. Otherwise unremarkable muscle bulk and signal intensity within the pelvis. Other findings Miscellaneous: No inguinal lymphadenopathy. No acute findings are seen within the pelvis. IMPRESSION: 1. Degenerative changes of the left SI joint with subtle erosive changes and a trace effusion. Findings may be related to sacroiliitis, which  could be inflammatory or infectious. 2. Redemonstrated probable bone island within the posterior left ilium closely approximating the left SI joint.  There is marrow edema within the adjacent ileum. It is unclear if this is reactive secondary to adjacent SI joint arthropathy or related to the bone lesion. 3. No acute osseous abnormality of the left hip. Specifically, no evidence of septic arthritis of the left hip. 4. Small nonspecific right-sided facet joint effusions at L4-5 and L5-S1 seen at the edge of the field-of-view with adjacent soft tissue edema. Findings could be degenerative or potentially reflect septic arthritis given the patient's history. Electronically Signed   By: Duanne Guess D.O.   On: 10/14/2020 16:42   DG CHEST PORT 1 VIEW  Result Date: 10/25/2020 CLINICAL DATA:  Hypoxia EXAM: PORTABLE CHEST 1 VIEW COMPARISON:  Chest x-ray dated October 23, 2020 FINDINGS: Cardiac and mediastinal contours are unchanged and within normal limits. Elevation of the right hemidiaphragm. New right greater than left basilar linear opacities, likely due to atelectasis. No large pleural effusion or evidence of pneumothorax. IMPRESSION: New right greater than left basilar linear opacities, likely due to atelectasis. Electronically Signed   By: Allegra Lai M.D.   On: 10/25/2020 08:40   DG Chest Port 1 View  Result Date: 10/23/2020 CLINICAL DATA:  45 year old male with history of fever and shortness of breath. EXAM: PORTABLE CHEST 1 VIEW COMPARISON:  No priors. FINDINGS: Lung volumes are low. Elevation of the right hemidiaphragm. No consolidative airspace disease. No pleural effusions. No pneumothorax. No pulmonary nodule or mass noted. Pulmonary vasculature and the cardiomediastinal silhouette are within normal limits. IMPRESSION: 1. Low lung volumes without radiographic evidence of acute cardiopulmonary disease. 2. Elevation of the right hemidiaphragm. Electronically Signed   By: Trudie Reed M.D.   On: 10/23/2020 08:27   DG Abd Portable 1V  Result Date: 10/27/2020 CLINICAL DATA:  Ileus EXAM: PORTABLE ABDOMEN - 1 VIEW COMPARISON:  10/26/2020  abdominal radiograph FINDINGS: Lower abdomen is not visualized on this single semi erect portable abdomen radiograph. No evidence of pneumatosis or pneumoperitoneum. Prominent diffuse gaseous distention of the visualized colon, most prominent in the cecum measuring approximately 10.1 cm diameter, increased from 8.5 cm. No definite small bowel dilatation. No radiopaque nephrolithiasis. Mild bibasilar platelike scarring versus atelectasis. IMPRESSION: Prominent diffuse gaseous distention of the visualized colon, most prominent in the cecum, increased, compatible with worsening adynamic ileus. Electronically Signed   By: Delbert Phenix M.D.   On: 10/27/2020 14:56   DG Abd Portable 1V  Result Date: 10/26/2020 CLINICAL DATA:  Ileus EXAM: PORTABLE ABDOMEN - 1 VIEW COMPARISON:  10/25/2020 FINDINGS: Lung volumes are small with mild elevation of the right hemidiaphragm. Mild bibasilar atelectasis or infiltrate. No free intraperitoneal gas. The pelvis is excluded from view. Visualized loops of gas-filled mildly dilated large bowel are unchanged measuring up to 8.5 cm in greatest dimension. IMPRESSION: Stable gas-filled loops of dilated large bowel within the visualized upper abdomen. No free air. Electronically Signed   By: Helyn Numbers M.D.   On: 10/26/2020 15:12   DG Abd Portable 1V  Result Date: 10/25/2020 CLINICAL DATA:  Ileus, hypoxia EXAM: PORTABLE ABDOMEN - 1 VIEW COMPARISON:  KUB dated 1 day prior FINDINGS: There is marked gaseous distention of the bowel. The cecum measures up to 10.0 cm, similar to the prior study. A loop of bowel in the upper abdomen measures up to 8.9 cm, also not significant changed. There is no definite free air, suboptimally evaluated on this single  supine view. IMPRESSION: Overall unchanged significant gaseous distention of the bowel. Electronically Signed   By: Lesia Hausen M.D.   On: 10/25/2020 08:41   DG Abd Portable 1V  Result Date: 10/24/2020 CLINICAL DATA:  Nausea and  vomiting. EXAM: PORTABLE ABDOMEN - 1 VIEW COMPARISON:  CT abdomen and pelvis 10/23/2020. FINDINGS: The colon is diffusely dilated and air-filled measuring up to 9 cm. Degree of distention has slightly increased. Air seen to the level of the sigmoid colon. There is mild gaseous distension of small bowel. No suspicious calcifications. No obvious free air, but free air is difficult to exclude on supine view. IMPRESSION: 1. Diffuse colonic dilatation has increased in size compared to prior. Findings are concerning for worsening colonic ileus. Continued follow-up recommended. Electronically Signed   By: Darliss Cheney M.D.   On: 10/24/2020 16:00   DG Hip Unilat W or Wo Pelvis 2-3 Views Left  Result Date: 10/13/2020 CLINICAL DATA:  Severe left hip pain for 3 days, no known injury EXAM: DG HIP (WITH OR WITHOUT PELVIS) 2-3V LEFT COMPARISON:  CT pelvis, 03/31/2016 FINDINGS: There is no evidence of hip fracture or dislocation. There is no evidence of arthropathy. Unchanged sclerotic lesion of the left ilium, previously characterized as a benign bone island. IMPRESSION: No fracture or dislocation of the left hip. Joint spaces are preserved. Electronically Signed   By: Lauralyn Primes M.D.   On: 10/13/2020 14:53      Subjective: Denies any abdominal pain.  Reports that he is having bowel movements.  Discharge Exam: Vitals:   10/31/20 2050 11/01/20 0614 11/01/20 0907 11/01/20 1314  BP: 116/79 123/79 130/87 136/82  Pulse: 100 92 93 (!) 103  Resp: Temp: 99.1 F (37.3 C) 99.8 F (37.7 C)  98.9 F (37.2 C)  TempSrc: Oral Oral  Oral  SpO2: 92% 95%  93%  Weight:      Height:        General: Pt is alert, awake, not in acute distress Cardiovascular: RRR, S1/S2 +, no rubs, no gallops Respiratory: CTA bilaterally, no wheezing, no rhonchi Abdominal: Soft, NT, ND, bowel sounds + Extremities: no edema, no cyanosis    The results of significant diagnostics from this hospitalization (including imaging,  microbiology, ancillary and laboratory) are listed below for reference.     Microbiology: Recent Results (from the past 240 hour(s))  Culture, blood (routine x 2)     Status: None   Collection Time: 10/23/20  8:15 AM   Specimen: BLOOD  Result Value Ref Range Status   Specimen Description   Final    BLOOD BLOOD LEFT HAND Performed at [facility], [address], [place], [place] [address]    Special Requests   Final    BOTTLES DRAWN AEROBIC ONLY Blood Culture adequate volume Performed at [facility], [address] [place], [place] [address]    Culture   Final    NO GROWTH 5 DAYS Performed at [facility], [address], [place], [place] [address]    Report Status 10/28/2020 FINAL  Final  Culture, blood (routine x 2)     Status: None   Collection Time: 10/23/20  8:25 AM   Specimen: BLOOD  Result Value Ref Range Status   Specimen Description   Final    BLOOD BLOOD RIGHT WRIST Performed at [facility], [address], [place], [place] [address]    Special Requests   Final  BOTTLES DRAWN AEROBIC ONLY Blood Culture adequate volume Performed at [facility], [address], [place], [place] [address]    Culture   Final    NO GROWTH 5 DAYS Performed at [facility], [address], [place], [place] [address]    Report Status 10/28/2020 FINAL  Final  Resp Panel by RT-PCR (Flu A&B, Covid) Nasopharyngeal Swab     Status: None   Collection Time: 10/23/20  8:25 AM   Specimen: Nasopharyngeal Swab; Nasopharyngeal(NP) swabs in vial transport medium  Result Value Ref Range Status   SARS Coronavirus 2 by RT PCR NEGATIVE NEGATIVE Final    Comment: (NOTE) SARS-CoV-2 target nucleic acids are NOT DETECTED.  The SARS-CoV-2 RNA is generally detectable in upper respiratory specimens during the acute phase of infection. The lowest concentration of SARS-CoV-2 viral copies this assay can detect  is 138 copies/mL. A negative result does not preclude SARS-Cov-2 infection and should not be used as the sole basis for treatment or other patient management decisions. A negative result may occur with  improper specimen collection/handling, submission of specimen other than nasopharyngeal swab, presence of viral mutation(s) within the areas targeted by this assay, and inadequate number of viral copies(<138 copies/mL). A negative result must be combined with clinical observations, patient history, and epidemiological information. The expected result is Negative.  Fact Sheet for Patients:  BloggerCourse.com  Fact Sheet for Healthcare Providers:  SeriousBroker.it  This test is no t yet approved or cleared by the [place] FDA and  has been authorized for detection and/or diagnosis of SARS-CoV-2 by FDA under an Emergency Use Authorization (EUA). This EUA will remain  in effect (meaning this test can be used) for the duration of the COVID-19 declaration under Section 564(b)(1) of the Act, 21 U.S.C.section 360bbb-3(b)(1), unless the authorization is terminated  or revoked sooner.       Influenza A by PCR NEGATIVE NEGATIVE Final   Influenza B by PCR NEGATIVE NEGATIVE Final    Comment: (NOTE) The Xpert Xpress SARS-CoV-2/FLU/RSV plus assay is intended as an aid in the diagnosis of influenza from Nasopharyngeal swab specimens and should not be used as a sole basis for treatment. Nasal washings and aspirates are unacceptable for Xpert Xpress SARS-CoV-2/FLU/RSV testing.  Fact Sheet for Patients: BloggerCourse.com  Fact Sheet for Healthcare Providers: SeriousBroker.it  This test is not yet approved or cleared by the [place] FDA and has been authorized for detection and/or diagnosis of SARS-CoV-2 by FDA under an Emergency Use Authorization (EUA). This EUA will remain in effect  (meaning this test can be used) for the duration of the COVID-19 declaration under Section 564(b)(1) of the Act, 21 U.S.C. section 360bbb-3(b)(1), unless the authorization is terminated or revoked.  Performed at [facility], [address] [place], [place] [address]      Labs: BNP (last 3 results) No results for input(s): BNP in the last 8760 hours. Basic Metabolic Panel: Recent Labs  Lab 10/27/20 0529 10/29/20 0549 10/30/20 0528  NA 144 151* 146*  K 3.8 3.5 3.9  CL 115* 121* 118*  CO2 21* 19* 22  GLUCOSE 115* 115* 120*  BUN 32* 22* 19  CREATININE 1.70* 1.77* 1.53*  CALCIUM 9.4 9.6 9.4  MG  --  1.9 1.8   Liver Function Tests: No results for input(s): AST, ALT, ALKPHOS, BILITOT, PROT, ALBUMIN in the last 168 hours. No results for input(s): LIPASE, AMYLASE in the last 168 hours. No results for input(s): AMMONIA in  the last 168 hours. CBC: No results for input(s): WBC, NEUTROABS, HGB, HCT, MCV, PLT in the last 168 hours. Cardiac Enzymes: No results for input(s): CKTOTAL, CKMB, CKMBINDEX, TROPONINI in the last 168 hours. BNP: Invalid input(s): POCBNP CBG: Recent Labs  Lab 10/31/20 1136 10/31/20 1724 10/31/20 2037 11/01/20 0731 11/01/20 1133  GLUCAP 99 133* 124* 116* 97   D-Dimer No results for input(s): DDIMER in the last 72 hours. Hgb A1c No results for input(s): HGBA1C in the last 72 hours. Lipid Profile No results for input(s): CHOL, HDL, LDLCALC, TRIG, CHOLHDL, LDLDIRECT in the last 72 hours. Thyroid function studies No results for input(s): TSH, T4TOTAL, T3FREE, THYROIDAB in the last 72 hours.  Invalid input(s): FREET3 Anemia work up No results for input(s): VITAMINB12, FOLATE, FERRITIN, TIBC, IRON, RETICCTPCT in the last 72 hours. Urinalysis    Component Value Date/Time   COLORURINE YELLOW 10/23/2020 0649   APPEARANCEUR CLEAR 10/23/2020 0649   LABSPEC 1.012 10/23/2020 0649   PHURINE 5.0 10/23/2020 0649   GLUCOSEU NEGATIVE  10/23/2020 0649   GLUCOSEU NEG mg/dL 46/65/9935 7017   HGBUR SMALL (A) 10/23/2020 0649   BILIRUBINUR NEGATIVE 10/23/2020 0649   KETONESUR NEGATIVE 10/23/2020 0649   PROTEINUR 100 (A) 10/23/2020 0649   UROBILINOGEN 0.2 01/09/2012 1007   NITRITE NEGATIVE 10/23/2020 0649   LEUKOCYTESUR NEGATIVE 10/23/2020 0649   Sepsis Labs Invalid input(s): PROCALCITONIN,  WBC,  LACTICIDVEN Microbiology Recent Results (from the past 240 hour(s))  Culture, blood (routine x 2)     Status: None   Collection Time: 10/23/20  8:15 AM   Specimen: BLOOD  Result Value Ref Range Status   Specimen Description   Final    BLOOD BLOOD LEFT HAND Performed at [facility], [address] [place], [place] [address]    Special Requests   Final    BOTTLES DRAWN AEROBIC ONLY Blood Culture adequate volume Performed at [facility], [address], [place], [place] [address]    Culture   Final    NO GROWTH 5 DAYS Performed at [facility], [address], [place], [place] [address]    Report Status 10/28/2020 FINAL  Final  Culture, blood (routine x 2)     Status: None   Collection Time: 10/23/20  8:25 AM   Specimen: BLOOD  Result Value Ref Range Status   Specimen Description   Final    BLOOD BLOOD RIGHT WRIST Performed at [facility], [address] [place], [place] [address]    Special Requests   Final    BOTTLES DRAWN AEROBIC ONLY Blood Culture adequate volume Performed at [facility], [address] [place], [place] [address]    Culture   Final    NO GROWTH 5 DAYS Performed at [facility], [address] [place], [place] [address]    Report Status 10/28/2020 FINAL  Final  Resp Panel by RT-PCR (Flu A&B, Covid) Nasopharyngeal Swab     Status: None   Collection Time: 10/23/20  8:25 AM   Specimen: Nasopharyngeal Swab; Nasopharyngeal(NP) swabs in vial transport medium  Result Value Ref Range Status   SARS  Coronavirus 2 by RT PCR NEGATIVE NEGATIVE Final    Comment: (NOTE) SARS-CoV-2 target nucleic acids are NOT DETECTED.  The SARS-CoV-2 RNA is generally detectable in upper respiratory specimens during the acute phase of infection. The lowest concentration of SARS-CoV-2 viral copies this assay can detect is 138 copies/mL. A negative result does not  preclude SARS-Cov-2 infection and should not be used as the sole basis for treatment or other patient management decisions. A negative result may occur with  improper specimen collection/handling, submission of specimen other than nasopharyngeal swab, presence of viral mutation(s) within the areas targeted by this assay, and inadequate number of viral copies(<138 copies/mL). A negative result must be combined with clinical observations, patient history, and epidemiological information. The expected result is Negative.  Fact Sheet for Patients:  BloggerCourse.com  Fact Sheet for Healthcare Providers:  SeriousBroker.it  This test is no t yet approved or cleared by the [place] FDA and  has been authorized for detection and/or diagnosis of SARS-CoV-2 by FDA under an Emergency Use Authorization (EUA). This EUA will remain  in effect (meaning this test can be used) for the duration of the COVID-19 declaration under Section 564(b)(1) of the Act, 21 U.S.C.section 360bbb-3(b)(1), unless the authorization is terminated  or revoked sooner.       Influenza A by PCR NEGATIVE NEGATIVE Final   Influenza B by PCR NEGATIVE NEGATIVE Final    Comment: (NOTE) The Xpert Xpress SARS-CoV-2/FLU/RSV plus assay is intended as an aid in the diagnosis of influenza from Nasopharyngeal swab specimens and should not be used as a sole basis for treatment. Nasal washings and aspirates are unacceptable for Xpert Xpress SARS-CoV-2/FLU/RSV testing.  Fact Sheet for  Patients: BloggerCourse.com  Fact Sheet for Healthcare Providers: SeriousBroker.it  This test is not yet approved or cleared by the [place] FDA and has been authorized for detection and/or diagnosis of SARS-CoV-2 by FDA under an Emergency Use Authorization (EUA). This EUA will remain in effect (meaning this test can be used) for the duration of the COVID-19 declaration under Section 564(b)(1) of the Act, 21 U.S.C. section 360bbb-3(b)(1), unless the authorization is terminated or revoked.  Performed at [facility], [address], [place], [place] [address]      Time coordinating discharge:  SIGNED:   Erick Blinks, MD  [facility] 11/01/2020, 9:31 PM   If 7PM-7AM, please contact night-coverage [email or web address]

## 2020-11-01 NOTE — TOC Transition Note (Signed)
Transition of Care [facility]) - CM/SW Discharge Note   Patient Details  Name: Kyle Montgomery MRN: [id number] Date of Birth: 1975/10/24  Transition of Care [facility]) CM/SW Contact:  Bartholome Bill, RN Phone Number: 11/01/2020, 11:56 AM   Clinical Narrative:     Pt requested [facility] transport home. [facility] contacted for transport. RN aware. [facility] to provide HHPT at dc.    Barriers to Discharge: Continued Medical Work up   Patient Goals and CMS Choice        Discharge Placement                       Discharge Plan and Services   Discharge Planning Services: CM Consult                                 Social Determinants of Health (SDOH) Interventions     Readmission Risk Interventions Readmission Risk Prevention Plan 10/27/2020  Transportation Screening Complete  PCP or Specialist Appt within 3-5 Days Complete  HRI or Home Care Consult Complete  Social Work Consult for Recovery Care Planning/Counseling Complete  Some recent data might be hidden

## 2020-11-06 ENCOUNTER — Telehealth: Payer: Self-pay | Admitting: *Deleted

## 2020-11-06 ENCOUNTER — Ambulatory Visit (INDEPENDENT_AMBULATORY_CARE_PROVIDER_SITE_OTHER): Payer: Medicaid Other | Admitting: Student

## 2020-11-06 DIAGNOSIS — M009 Pyogenic arthritis, unspecified: Secondary | ICD-10-CM | POA: Diagnosis present

## 2020-11-06 MED ORDER — TRAMADOL HCL 50 MG PO TABS: 50.0000 mg | ORAL_TABLET | Freq: Two times a day (BID) | ORAL | 0 refills | Status: DC | PRN

## 2020-11-06 NOTE — Progress Notes (Signed)
  [facility] Internal Medicine Residency Telephone Encounter Continuity Care Appointment  HPI:  This telephone encounter was created for [name] on 11/06/2020 for the following purpose/cc: left hip pain. Verified DOB and home address. Patient reports that he continues to have left hip pain. He was recently hospitalized for left hip septic arthritis, was given IV antibiotics and eventually discharged with PO doxycycline BID and Augmentin BID to finish outpatient antibiotics. He was also given oxycodone for pain relief. Unfortunately, patient developed ileus and was rehospitalized for this, requiring colonic decompression. Since last hospitalization, he has been doing fine from an ileus perspective. Reports actually having more bowel movements than he wants and thus had to stop laxative therapy. He has still been having consistent BMs.  His main complaint today is persistent left hip pain. He has since finished his recommended course of antibiotics, but still continues to have left hip pain which has practically rendered him bedbound. As he was hospitalized with ileus, his opioid pain medication was discontinued and he was advised to take tylenol for pain control. Per patient, tylenol has not provided adequate pain relief. He denies any fevers, chills, N/V, constipation. Discussed that it will be important for [place] to evaluate him in the clinic to better guide therapy. We will arrange for [facility] to bring patient to his next clinic appointment this Thursday. Will need to perform labs (CBC, CMP) and possibly additional imaging (refused MRI during last hospitalization).  In the meantime, will prescribe low dose tramadol for better pain control. Advised patient to take laxative therapy to target at least 1 BM each day while on tramadol. Will also place referral for home health PT to help with strength training.    Past Medical History:  Past Medical History:  Diagnosis Date   Bipolar affective (HCC)     followed by mental health   Gynecomastia 02/07   normal pituitary by MRI   Hyperprolactinemia (HCC) 09/27/04   felt 2/2 lithium and fluphenazine   Hypertension    Renal insufficiency    baseline Cr 2.2 - renal US with increased Echo signal, no nephrology eval, no bx     ROS:  Negative aside from that listed in HPI.   Assessment / Plan / Recommendations:  Please see A&P under problem oriented charting for assessment of the patient's acute and chronic medical conditions.  As always, pt is advised that if symptoms worsen or new symptoms arise, they should go to an urgent care facility or to to ER for further evaluation.   Consent and Medical Decision Making:  Patient discussed with Dr. Antony Contras This is a telephone encounter between [name] and Merrilyn Puma on 11/06/2020 for hospital f/u for left hip pain. The visit was conducted with the patient located at home and Merrilyn Puma at [facility] The patient's identity was confirmed using their DOB and current address. The patient has consented to being evaluated through a telephone encounter and understands the associated risks (an examination cannot be done and the patient may need to come in for an appointment) / benefits (allows the patient to remain at home, decreasing exposure to coronavirus). I personally spent 16 minutes on medical discussion.

## 2020-11-06 NOTE — Telephone Encounter (Signed)
Patient's mother called in stating she would not be able to bring patient to HFU on 10/19. States he had to be brought home from the hospital by ambulance because he is in so much pain. States he cannot get out of the bed. Eating very little. He was told to take tylenol which provides no relief. Placed on Dr. Everardo Pacific schedule today for telehealth. Patient does need repeat BMP and CBC. Kept HFU for 10/19 in case patient is more mobile by then.

## 2020-11-06 NOTE — Assessment & Plan Note (Signed)
Patient reports that he continues to have left hip pain. He was recently hospitalized for left hip septic arthritis, was given IV antibiotics and eventually discharged with PO doxycycline BID and Augmentin BID to finish outpatient antibiotics. He was also given oxycodone for pain relief. Unfortunately, patient developed ileus and was rehospitalized for this, requiring colonic decompression. Since last hospitalization, he has been doing fine from an ileus perspective. Reports actually having more bowel movements than he wants and thus had to stop laxative therapy. He has still been having consistent BMs.   His main complaint today is persistent left hip pain. He has since finished his recommended course of antibiotics, but still continues to have left hip pain which has practically rendered him bedbound. As he was hospitalized with ileus, his opioid pain medication was discontinued and he was advised to take tylenol for pain control. Per patient, tylenol has not provided adequate pain relief. He denies any fevers, chills, N/V, constipation. Discussed that it will be important for [place] to evaluate him in the clinic to better guide therapy.  Plan: We will arrange for [facility] to bring patient to his next clinic appointment this Thursday. Will need to perform labs (CBC, CMP) and possibly additional imaging (refused MRI during last hospitalization).   In the meantime, will prescribe low dose tramadol for better pain control. Advised patient to take laxative therapy to target at least 1 BM each day while on tramadol. Will also place referral for home health PT to help with strength training.

## 2020-11-06 NOTE — Telephone Encounter (Signed)
Spoke with Pearn at [facility] [phone]). They will p/u patient at his home at 0915 on 10/19 and bring him to [facility] for his 1015 appt. Mother made aware and she is very Adult nurse.

## 2020-11-07 ENCOUNTER — Telehealth: Payer: Self-pay

## 2020-11-07 NOTE — Telephone Encounter (Signed)
Pt had called earlier to schedule transportation for his appt tomorrow d/t his inability to walk, he's in a w/c.[name] Pt wanted the office to call [phone] which I did. It's [facility] - they do not have him in their system. So I called [facility] who stated there's a form which needs to be completed by the pt and the doctor and may take 2 weeks to process.  I called pt backto let him know about the above. Pt's mother in the background asking pt to let her talk to me.Once she got on the phone, she stated the doctor talked to the SW and has arranged for pt to come by ambulance tomorrow for his appt. And she did not know why the pt had called our office, apologetic.

## 2020-11-07 NOTE — Telephone Encounter (Signed)
Pt is requesting a call back  about his ride to his appt today again

## 2020-11-08 ENCOUNTER — Encounter: Payer: Self-pay | Admitting: Student

## 2020-11-08 ENCOUNTER — Ambulatory Visit (INDEPENDENT_AMBULATORY_CARE_PROVIDER_SITE_OTHER): Payer: Medicaid Other | Admitting: Student

## 2020-11-08 VITALS — BP 128/93 | HR 86 | Temp 98.8°F | Resp 28 | Ht 70.0 in

## 2020-11-08 DIAGNOSIS — M009 Pyogenic arthritis, unspecified: Secondary | ICD-10-CM | POA: Diagnosis not present

## 2020-11-08 DIAGNOSIS — F3170 Bipolar disorder, currently in remission, most recent episode unspecified: Secondary | ICD-10-CM | POA: Diagnosis not present

## 2020-11-08 DIAGNOSIS — E7849 Other hyperlipidemia: Secondary | ICD-10-CM | POA: Diagnosis not present

## 2020-11-08 DIAGNOSIS — I10 Essential (primary) hypertension: Secondary | ICD-10-CM | POA: Diagnosis not present

## 2020-11-08 DIAGNOSIS — R531 Weakness: Secondary | ICD-10-CM | POA: Diagnosis not present

## 2020-11-08 MED ORDER — AMLODIPINE BESYLATE 10 MG PO TABS: 10.0000 mg | ORAL_TABLET | Freq: Every day | ORAL | 1 refills | Status: DC

## 2020-11-08 MED ORDER — ATORVASTATIN CALCIUM 40 MG PO TABS: 40.0000 mg | ORAL_TABLET | Freq: Every day | ORAL | 3 refills | Status: DC

## 2020-11-08 MED ORDER — METOPROLOL TARTRATE 25 MG PO TABS: 25.0000 mg | ORAL_TABLET | Freq: Two times a day (BID) | ORAL | 11 refills | Status: DC

## 2020-11-08 MED ORDER — DIVALPROEX SODIUM ER 500 MG PO TB24: 500.0000 mg | ORAL_TABLET | Freq: Every day | ORAL | 1 refills | Status: DC

## 2020-11-08 MED ORDER — GABAPENTIN 300 MG PO CAPS: 300.0000 mg | ORAL_CAPSULE | Freq: Every day | ORAL | 0 refills | Status: DC

## 2020-11-08 MED ORDER — ZIPRASIDONE HCL 80 MG PO CAPS: 80.0000 mg | ORAL_CAPSULE | Freq: Two times a day (BID) | ORAL | 2 refills | Status: DC

## 2020-11-08 NOTE — Assessment & Plan Note (Signed)
Vitals:   11/08/20 0948  BP: (!) 128/93   Patient with history of HTN, on norvasc and lopressor. BP is well controlled today. Will continue current medications.

## 2020-11-08 NOTE — Progress Notes (Signed)
   CC: hospital f/u for L hip septic arthritis  HPI:  Kyle Montgomery is a 45 y.o. male with history listed below presenting to the [facility] for hospital f/u for L hip septic arthritis. Please see individualized problem based charting for full HPI.  Past Medical History:  Diagnosis Date  . Bipolar affective (HCC)    followed by mental health  . Gynecomastia 02/07   normal pituitary by MRI  . 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 Hypertension   . Renal insufficiency    baseline Cr 2.2 - renal US with increased Echo signal, no nephrology eval, no bx    Review of Systems:  Negative aside from that listed in individualized problem based charting.  Physical Exam:  Vitals:   11/08/20 0948  BP: (!) 128/93  Pulse: 86  Resp: (!) 28  Temp: 98.8 F (37.1 C)  TempSrc: Oral  SpO2: 97%  Height: 5\' 10"  (1.778 m)   Physical Exam Constitutional:      Comments: Chronically ill-appearing  HENT:     Mouth/Throat:     Mouth: Mucous membranes are moist.     Pharynx: Oropharynx is clear. No oropharyngeal exudate.  Eyes:     Extraocular Movements: Extraocular movements intact.     Conjunctiva/sclera: Conjunctivae normal.     Pupils: Pupils are equal, round, and reactive to light.  Cardiovascular:     Rate and Rhythm: Normal rate and regular rhythm.     Pulses: Normal pulses.     Heart sounds: Normal heart sounds. No murmur heard.   No gallop.  Pulmonary:     Effort: Pulmonary effort is normal.     Breath sounds: Normal breath sounds. No wheezing, rhonchi or rales.  Abdominal:     General: There is no distension.     Palpations: Abdomen is soft.     Tenderness: There is no abdominal tenderness.     Comments: Hyperactive bowel sounds  Musculoskeletal:     Comments: Left hip with no increased warmth, swelling, or TTP. Left hip stiffness noted.  Skin:    General: Skin is warm and dry.  Neurological:     Mental Status: He is alert. Mental status is at  baseline.     Comments: LLE weakness noted  Psychiatric:        Mood and Affect: Mood normal.        Behavior: Behavior normal.     Assessment & Plan:   See Encounters Tab for problem based charting.  Patient discussed with Dr. 

## 2020-11-08 NOTE — Patient Instructions (Signed)
Kyle Montgomery,  It was a pleasure seeing you in the clinic today.   Please make sure to work with physical therapy when they come by your home. Please use tramadol as needed for pain relief. I have refilled your medications and sent them to your pharmacy. I have attached a soft diet list.  Please call our clinic at [phone] if you have any questions or concerns. The best time to call is Monday-Friday from 9am-4pm, but there is someone available 24/7 at the same number. If you need medication refills, please notify your pharmacy one week in advance and they will send [place] a request.   Thank you for letting us take part in your care. We look forward to seeing you next time!

## 2020-11-08 NOTE — Assessment & Plan Note (Signed)
Patient here for f/u of left hip septic arthritis (please see prior note from 11/06/2020 for further details). He has completed his antibiotic therapy. Patient and mother report improvement in pain with tramadol. However, patient is still very weak and HH PT has not yet come by to work on Runner, broadcasting/film/video. Discussed that patient's weakness is likely from deconditioning 2/2 septic arthritis resulting in patient being bedbound.   Spoke with nursing staff, who will contact [facility] PT) to work with patient. Given patient's Medicaid coverage, HH PT will likely only be able to work with him in the short term but I anticipate that this will improve patient's weakness.  Plan: -tylenol prn and tramadol BID prn for pain relief -HH PT for strength training

## 2020-11-13 NOTE — Progress Notes (Signed)
Internal Medicine Clinic Attending  Case discussed with Dr. Jinwala  At the time of the visit.  We reviewed the resident's history and exam and pertinent patient test results.  I agree with the assessment, diagnosis, and plan of care documented in the resident's note.  

## 2021-02-08 ENCOUNTER — Encounter: Payer: Medicaid Other | Admitting: Internal Medicine

## 2021-02-23 ENCOUNTER — Other Ambulatory Visit: Payer: Self-pay

## 2021-02-23 MED ORDER — ZIPRASIDONE HCL 80 MG PO CAPS: 80.0000 mg | ORAL_CAPSULE | Freq: Two times a day (BID) | ORAL | 2 refills | Status: DC

## 2021-02-23 NOTE — Telephone Encounter (Signed)
ziprasidone (GEODON) 80 MG capsule (Expired), REQUEST REFILL @ [facility] [phone] - [place], [place] AT [address].

## 2021-02-23 NOTE — Telephone Encounter (Signed)
Hello,   I believe this is a yellow team patient. I will forward to Dr. Allyson Sabal and the Yellow team.   Best regards,  [facility]

## 2021-02-24 ENCOUNTER — Emergency Department (HOSPITAL_COMMUNITY)
Admission: EM | Admit: 2021-02-24 | Discharge: 2021-02-24 | Disposition: A | Discharge status: Home / Self Care | Payer: Medicaid Other | Attending: Emergency Medicine | Admitting: Emergency Medicine

## 2021-02-24 ENCOUNTER — Other Ambulatory Visit: Payer: Self-pay

## 2021-02-24 ENCOUNTER — Encounter (HOSPITAL_COMMUNITY): Payer: Self-pay | Admitting: Emergency Medicine

## 2021-02-24 DIAGNOSIS — R1013 Epigastric pain: Secondary | ICD-10-CM | POA: Diagnosis not present

## 2021-02-24 DIAGNOSIS — E876 Hypokalemia: Secondary | ICD-10-CM | POA: Diagnosis not present

## 2021-02-24 DIAGNOSIS — Z760 Encounter for issue of repeat prescription: Secondary | ICD-10-CM | POA: Insufficient documentation

## 2021-02-24 DIAGNOSIS — I10 Essential (primary) hypertension: Secondary | ICD-10-CM

## 2021-02-24 DIAGNOSIS — N183 Chronic kidney disease, stage 3 unspecified: Secondary | ICD-10-CM | POA: Diagnosis not present

## 2021-02-24 DIAGNOSIS — E1122 Type 2 diabetes mellitus with diabetic chronic kidney disease: Secondary | ICD-10-CM | POA: Diagnosis not present

## 2021-02-24 DIAGNOSIS — R12 Heartburn: Secondary | ICD-10-CM | POA: Diagnosis present

## 2021-02-24 LAB — COMPREHENSIVE METABOLIC PANEL
ALT: 42 U/L (ref 0–44)
AST: 33 U/L (ref 15–41)
Albumin: 3.4 g/dL — ABNORMAL LOW (ref 3.5–5.0)
Alkaline Phosphatase: 69 U/L (ref 38–126)
Anion gap: 13 (ref 5–15)
BUN: 13 mg/dL (ref 6–20)
CO2: 25 mmol/L (ref 22–32)
Calcium: 9.1 mg/dL (ref 8.9–10.3)
Chloride: 106 mmol/L (ref 98–111)
Creatinine, Ser: 1.91 mg/dL — ABNORMAL HIGH (ref 0.61–1.24)
GFR, Estimated: 44 mL/min — ABNORMAL LOW (ref >60)
Glucose, Bld: 137 mg/dL — ABNORMAL HIGH (ref 70–99)
Potassium: 3.3 mmol/L — ABNORMAL LOW (ref 3.5–5.1)
Sodium: 144 mmol/L (ref 135–145)
Total Bilirubin: 0.3 mg/dL (ref 0.3–1.2)
Total Protein: 7 g/dL (ref 6.5–8.1)

## 2021-02-24 LAB — CBC
HCT: 41.4 % (ref 39.0–52.0)
Hemoglobin: 13.6 g/dL (ref 13.0–17.0)
MCH: 28.5 pg (ref 26.0–34.0)
MCHC: 32.9 g/dL (ref 30.0–36.0)
MCV: 86.6 fL (ref 80.0–100.0)
Platelets: 249 10*3/uL (ref 150–400)
RBC: 4.78 MIL/uL (ref 4.22–5.81)
RDW: 13.2 % (ref 11.5–15.5)
WBC: 6 10*3/uL (ref 4.0–10.5)
nRBC: 0 % (ref 0.0–0.2)

## 2021-02-24 LAB — LIPASE, BLOOD: Lipase: 79 U/L — ABNORMAL HIGH (ref 11–51)

## 2021-02-24 LAB — TROPONIN I (HIGH SENSITIVITY): Troponin I (High Sensitivity): 6 ng/L (ref <18)

## 2021-02-24 MED ORDER — AMLODIPINE BESYLATE 10 MG PO TABS: 10.0000 mg | ORAL_TABLET | Freq: Every day | ORAL | 0 refills | Status: DC

## 2021-02-24 NOTE — ED Triage Notes (Signed)
Patient reports "heartburn" for 3 weeks , also requesting medications for his hypertension .

## 2021-02-24 NOTE — Discharge Instructions (Addendum)
I have refilled your Amlodipine prescription. You have a paper prescription included in your discharge paperwork.  Please follow up with your PCP. Call the office on Monday to schedule an appointment to discuss your heartburn and blood pressure management.

## 2021-02-24 NOTE — ED Provider Notes (Signed)
[facility] EMERGENCY DEPARTMENT Provider Note   CSN: [id number] Arrival date & time: 02/24/21  0453     History  Chief Complaint  Patient presents with   "Heartburn"    Kyle Montgomery is a 46 y.o. male.  Past medical history includes CKD stage III, hyperlipidemia, diabetes type 2, septic arthritis, hypokalemia.   Patient presents emergency department with multiple complaints.  First he states that he has been out of his blood pressure medication.  He states that he is on amlodipine and lisinopril with unknown doses.  He says that he was admitted back in October 2022 and they had taken him off of his blood pressure medications at discharge, however he does not know why.  He continue to take these medications till February 23, 2021 and when he tried to refill it, he was notified that the orders have been discontinued by the discharge provider back in October.  Prior to yesterday, patient has been very compliant with his medications.  Patient denies any chest pain, shortness of breath, change in urination, headaches, or any other neurological symptoms.  Patient also complains of heartburn that he has been having for 2 weeks now.  He states the pain is in his epigastric region.  He describes it as a burning sensation.  The pain is intermittent and comes and goes.  He seems to think this is related to his blood pressure.  He is not on any PPI therapy.  He has had heartburn in the past, and this feels very similar.  He has no radiating symptoms to his back.  No fevers, chills, chest pain, shortness of breath, numbness, or tingling of extremities.  The time of my interview, patient states that he is having no pain at this time.  HPI     Home Medications Prior to Admission medications   Medication Sig Start Date End Date Taking? Authorizing Provider  acetaminophen (TYLENOL) 325 MG tablet Take 2 tablets (650 mg total) by mouth every 6 (six) hours as needed. Patient taking  differently: Take 650 mg by mouth every 6 (six) hours as needed for mild pain, fever or headache. 11/25/17   Mosetta Anis, MD  amLODipine (NORVASC) 10 MG tablet Take 1 tablet (10 mg total) by mouth daily. 02/24/21 03/26/21  Robin Petrakis, Adora Fridge, PA-C  atorvastatin (LIPITOR) 40 MG tablet Take 1 tablet (40 mg total) by mouth daily. 11/08/20   Virl Axe, MD  divalproex (DEPAKOTE ER) 500 MG 24 hr tablet Take 1 tablet (500 mg total) by mouth at bedtime. 11/08/20 05/07/21  Virl Axe, MD  gabapentin (NEURONTIN) 300 MG capsule Take 1 capsule (300 mg total) by mouth at bedtime. 11/08/20 12/08/20  Virl Axe, MD  metoprolol tartrate (LOPRESSOR) 25 MG tablet Take 1 tablet (25 mg total) by mouth 2 (two) times daily. 11/08/20 11/08/21  Virl Axe, MD  polyethylene glycol (MIRALAX / GLYCOLAX) 17 g packet Take 17 g by mouth daily as needed for mild constipation. 11/01/20   Kathie Dike, MD  traMADol (ULTRAM) 50 MG tablet Take 1 tablet (50 mg total) by mouth 2 (two) times daily as needed. 11/06/20   Virl Axe, MD  ziprasidone (GEODON) 80 MG capsule Take 1 capsule (80 mg total) by mouth 2 (two) times daily. 02/23/21 08/22/21  Lacinda Axon, MD      Allergies    Patient has no known allergies.    Review of Systems   Review of Systems  Constitutional:  Negative for fever.  Respiratory:  Negative for chest tightness and shortness of breath.   Cardiovascular:  Negative for chest pain, palpitations and leg swelling.  Gastrointestinal:  Positive for abdominal pain.  Endocrine: Negative for polyuria.  Genitourinary:  Negative for decreased urine volume.  Musculoskeletal:  Negative for back pain.  Neurological:  Negative for dizziness, syncope, facial asymmetry, speech difficulty, weakness, numbness and headaches.  All other systems reviewed and are negative.  Physical Exam Updated Vital Signs BP (!) 167/106    Pulse 80    Temp (!) 97.5 F (36.4 C)    Resp 18    SpO2 97%  Physical  Exam Vitals and nursing note reviewed.  Constitutional:      General: He is not in acute distress.    Appearance: Normal appearance. He is not ill-appearing, toxic-appearing or diaphoretic.  HENT:     Head: Normocephalic and atraumatic.     Nose: No nasal deformity.     Mouth/Throat:     Lips: Pink. No lesions.     Mouth: No injury, lacerations, oral lesions or angioedema.     Pharynx: Uvula midline. No pharyngeal swelling or uvula swelling.  Eyes:     General: Gaze aligned appropriately. No scleral icterus.       Right eye: No discharge.        Left eye: No discharge.     Conjunctiva/sclera: Conjunctivae normal.     Right eye: Right conjunctiva is not injected. No exudate or hemorrhage.    Left eye: Left conjunctiva is not injected. No exudate or hemorrhage. Cardiovascular:     Rate and Rhythm: Normal rate and regular rhythm.     Pulses: Normal pulses.          Radial pulses are 2+ on the right side and 2+ on the left side.       Dorsalis pedis pulses are 2+ on the right side and 2+ on the left side.     Heart sounds: Normal heart sounds, S1 normal and S2 normal. Heart sounds not distant. No murmur heard.   No friction rub. No gallop. No S3 or S4 sounds.  Pulmonary:     Effort: Pulmonary effort is normal. No accessory muscle usage or respiratory distress.     Breath sounds: Normal breath sounds. No stridor. No wheezing, rhonchi or rales.  Chest:     Chest wall: No tenderness.  Abdominal:     General: Abdomen is flat. Bowel sounds are normal. There is no distension.     Palpations: Abdomen is soft. There is no mass or pulsatile mass.     Tenderness: There is no abdominal tenderness. There is no guarding or rebound.  Musculoskeletal:     Right lower leg: No edema.     Left lower leg: No edema.  Skin:    General: Skin is warm and dry.     Coloration: Skin is not jaundiced or pale.     Findings: No bruising, erythema, lesion or rash.  Neurological:     General: No focal  deficit present.     Mental Status: He is alert and oriented to person, place, and time.     GCS: GCS eye subscore is 4. GCS verbal subscore is 5. GCS motor subscore is 6.  Psychiatric:        Mood and Affect: Mood normal.        Behavior: Behavior normal. Behavior is cooperative.    ED Results / Procedures / Treatments   Labs (all labs ordered  are listed, but only abnormal results are displayed) Labs Reviewed  LIPASE, BLOOD - Abnormal; Notable for the following components:      Result Value   Lipase 79 (*)    All other components within normal limits  COMPREHENSIVE METABOLIC PANEL - Abnormal; Notable for the following components:   Potassium 3.3 (*)    Glucose, Bld 137 (*)    Creatinine, Ser 1.91 (*)    Albumin 3.4 (*)    GFR, Estimated 44 (*)    All other components within normal limits  CBC  TROPONIN I (HIGH SENSITIVITY)    EKG None  Radiology No results found.  Procedures Procedures   Medications Ordered in ED Medications - No data to display  ED Course/ Medical Decision Making/ A&P Clinical Course as of 02/24/21 0907  Sat Feb 24, 2021  0634 K 3.3, slight bump in creat, lipase chronically elevated, cbc okay, trop neg [GL]  [id number] Last hopsitalization in Oct 2022 for septic arthritis of hip and also had colonic problems. Need to read more about this. [GL]    Clinical Course User Index [GL] Adolphus Birchwood, PA-C                           Medical Decision Making Problems Addressed: Epigastric pain: chronic illness or injury with exacerbation, progression, or side effects of treatment Essential hypertension: chronic illness or injury Medication refill: chronic illness or injury  Amount and/or Complexity of Data Reviewed Independent Historian:     Details: independent historian External Data Reviewed: labs, radiology, ECG and notes.    Details: reviewed previous admission and outpatient notes since October of 2022 Labs: ordered. Decision-making details  documented in ED Course. ECG/medicine tests: ordered and independent interpretation performed. Decision-making details documented in ED Course.  Risk Prescription drug management.   This is a 46 y.o. male with a PMH of CKD stage III, hyperlipidemia, diabetes type 2, septic arthritis, hypokalemia who presents to the ED with need for medication refill and evaluation of epigastric pains.   Patient is hypertensive while he is here up to 186/121.  When I was in the room, his blood pressure was 161/117.  History reveals no evidence of endorgan damage.  Labs reveal a mild hypokalemia 3.3 which appears to be chronic.  Does have a slight creatinine bump, however when compared to previous values, this does not appear to  be significant. Initial troponin is negative. Do not think that patient requires a second troponin because symptoms have been ongoing for three weeks now and patient is currently asymptomatic. Chest pain is also described to be more epigastric and more consistent with GI cause to pains. I also reviewed patient's recent mission back in October 2022.  I did see that they had discontinued his blood pressure medication at that time, however no in their note does not state why they have discontinued this.  In fact, patient's blood pressure was never addressed in any note that I could find during that admission.  I think, since patient has not missed any of these doses, it is reasonable to restart his amlodipine at this time.  Patient says that he is going to call his PCPs office on Monday to get a follow-up appointment for further management of his blood pressure.  I think this is a reasonable plan at this time.  Patient initially presented with complaints of epigastric pain.  Per patient, this is consistent with previous heartburn that he has  had in the past.  Seems to be better with Tums or other antacids.  He is asymptomatic at this time.  Labs reveal no hepatic function abnormalities.  Lipase is  elevated to 79, however it seems to be chronically elevated around this amount.  Chest pain work-up was overall negative at this time. I do not have a high suspicion for ACS.  I feel that it is reasonable that patient is discharged home at this time. I do not suspect an underlying cardiac cause to his symptoms. He does not have evidence of bowel obstruction. Abdominal exam is reassuring and I do not suspect any serious intraabdominal pathology such as cholecystitis, perforation, bowel obstruction, appendicitis, diverticulitis, pancreatitis, etc.   Portions of this note were generated with Dragon dictation software. Dictation errors may occur despite best attempts at proofreading.   Final Clinical Impression(s) / ED Diagnoses Final diagnoses:  Epigastric pain  Medication refill    Rx / DC Orders ED Discharge Orders          Ordered    amLODipine (NORVASC) 10 MG tablet  Daily        02/24/21 0659              Adolphus Birchwood, PA-C 02/24/21 [id number]    Tegeler, Gwenyth Allegra, MD 02/24/21 [phone]

## 2021-02-26 ENCOUNTER — Other Ambulatory Visit: Payer: Self-pay

## 2021-02-26 ENCOUNTER — Encounter: Payer: Self-pay | Admitting: Student

## 2021-02-26 ENCOUNTER — Ambulatory Visit (INDEPENDENT_AMBULATORY_CARE_PROVIDER_SITE_OTHER): Payer: Medicaid Other | Admitting: Student

## 2021-02-26 ENCOUNTER — Telehealth: Payer: Self-pay

## 2021-02-26 VITALS — BP 152/90 | HR 98 | Temp 98.2°F | Ht 70.0 in | Wt 207.8 lb

## 2021-02-26 DIAGNOSIS — F3170 Bipolar disorder, currently in remission, most recent episode unspecified: Secondary | ICD-10-CM | POA: Diagnosis not present

## 2021-02-26 DIAGNOSIS — E1122 Type 2 diabetes mellitus with diabetic chronic kidney disease: Secondary | ICD-10-CM

## 2021-02-26 DIAGNOSIS — Z23 Encounter for immunization: Secondary | ICD-10-CM

## 2021-02-26 DIAGNOSIS — E785 Hyperlipidemia, unspecified: Secondary | ICD-10-CM

## 2021-02-26 DIAGNOSIS — K567 Ileus, unspecified: Secondary | ICD-10-CM | POA: Diagnosis not present

## 2021-02-26 DIAGNOSIS — I129 Hypertensive chronic kidney disease with stage 1 through stage 4 chronic kidney disease, or unspecified chronic kidney disease: Secondary | ICD-10-CM | POA: Diagnosis not present

## 2021-02-26 DIAGNOSIS — N1831 Chronic kidney disease, stage 3a: Secondary | ICD-10-CM | POA: Diagnosis not present

## 2021-02-26 DIAGNOSIS — R801 Persistent proteinuria, unspecified: Secondary | ICD-10-CM

## 2021-02-26 DIAGNOSIS — E876 Hypokalemia: Secondary | ICD-10-CM

## 2021-02-26 DIAGNOSIS — E119 Type 2 diabetes mellitus without complications: Secondary | ICD-10-CM | POA: Diagnosis not present

## 2021-02-26 DIAGNOSIS — I10 Essential (primary) hypertension: Secondary | ICD-10-CM

## 2021-02-26 DIAGNOSIS — K5931 Toxic megacolon: Secondary | ICD-10-CM

## 2021-02-26 DIAGNOSIS — Z0000 Encounter for general adult medical examination without abnormal findings: Secondary | ICD-10-CM

## 2021-02-26 LAB — POCT GLYCOSYLATED HEMOGLOBIN (HGB A1C): Hemoglobin A1C: 5.7 % — AB (ref 4.0–5.6)

## 2021-02-26 LAB — GLUCOSE, CAPILLARY: Glucose-Capillary: 110 mg/dL — ABNORMAL HIGH (ref 70–99)

## 2021-02-26 MED ORDER — AMLODIPINE BESYLATE 10 MG PO TABS: 10.0000 mg | ORAL_TABLET | Freq: Every day | ORAL | 3 refills | Status: DC

## 2021-02-26 MED ORDER — LISINOPRIL-HYDROCHLOROTHIAZIDE 20-12.5 MG PO TABS: 1.0000 | ORAL_TABLET | Freq: Every day | ORAL | 11 refills | Status: DC

## 2021-02-26 MED ORDER — ZIPRASIDONE HCL 80 MG PO CAPS: 80.0000 mg | ORAL_CAPSULE | Freq: Two times a day (BID) | ORAL | 2 refills | Status: DC

## 2021-02-26 MED ORDER — DIVALPROEX SODIUM ER 500 MG PO TB24: 500.0000 mg | ORAL_TABLET | Freq: Every day | ORAL | 1 refills | Status: DC

## 2021-02-26 MED ORDER — RYBELSUS 7 MG PO TABS: 7.0000 mg | ORAL_TABLET | Freq: Every day | ORAL | 1 refills | Status: DC

## 2021-02-26 NOTE — Assessment & Plan Note (Addendum)
Well-controlled.  A1c today is 5.7% improved from 5.9% 4 months ago.  -- Refilled Rybelsus 7 mg daily -- Urine microalbumin/creatinine ratio  Addendum: UA microalbumin/creatinine ratio elevated to ~3000. Patient currently on an ACEi. He will need a UA at next office visit to check for sediment.  Consider maxing out ACE inhibitor if BP not at goal and further evaluation for glomerular disease if proteinuria does not improve.

## 2021-02-26 NOTE — Patient Instructions (Signed)
Thank you, [name] for allowing [place] to provide your care today. Today we discussed recent ER visit for heartburn and your blood pressure.  Blood pressure is still elevated so we are restarting you back on your medications.  We are also refilling some of your prescriptions. We want you to follow-up with the stomach doctors.   I have ordered the following labs for you:   Lab Orders         Microalbumin / Creatinine Urine Ratio         Glucose, capillary         POC Hbg A1C      I will call if any are abnormal. All of your labs can be accessed through "My Chart".  I have place a referrals to GI (Stomach doctor)  I have ordered the following medication/changed the following medications:  Restart lisinopril-HCTZ 20-12.5 mg daily Continue amlodipine 10 mg daily Restart Rybelsus 7 mg daily  My Chart Access: [email or web address]  Please follow-up in 3 months  Please make sure to arrive 15 minutes prior to your next appointment. If you arrive late, you may be asked to reschedule.    We look forward to seeing you next time. Please call our clinic at [phone] if you have any questions or concerns. The best time to call is Monday-Friday from 9am-4pm, but there is someone available 24/7. If after hours or the weekend, call the main hospital number and ask for the Internal Medicine Resident On-Call. If you need medication refills, please notify your pharmacy one week in advance and they will send [place] a request.   Thank you for letting us take part in your care. Wishing you the best!  Lacinda Axon, MD 02/26/2021, 10:05 AM IM Resident, PGY-2 Kyle Montgomery 41:10

## 2021-02-26 NOTE — Assessment & Plan Note (Signed)
Received a Tdap vaccine today. 

## 2021-02-26 NOTE — Assessment & Plan Note (Signed)
Last lipid panel showed an LDL of 80.  Patient is going to be <70.  -- Continue atorvastatin 40 mg daily -- Repeat lipid panel at next OV

## 2021-02-26 NOTE — Assessment & Plan Note (Signed)
Potassium improved to 3.3 on recent labs.  Continue to monitor closely.

## 2021-02-26 NOTE — Assessment & Plan Note (Signed)
Reports stable mood for many years on Geodon and Depakote.  Recent labs shows normal liver function and kidney function around baseline. --Refilled Geodon and Depakote

## 2021-02-26 NOTE — Assessment & Plan Note (Signed)
Creatinine still around baseline of 1.9.  Continue to monitor.

## 2021-02-26 NOTE — Telephone Encounter (Signed)
Pa for pt ( ZIPRASIDONE HCL 80 MG TABS )  came through  VIA fax from pharmacy  was done and submitted  to [place] [employer] with office notes from 02/26/21.[name] Awaiting fax  with approval or denial

## 2021-02-26 NOTE — Progress Notes (Signed)
° °  CC: ER follow-up  HPI:  [name] is a 46 y.o. male with PMH as below who presents to clinic today to follow-up after recent visit to the ER for epigastric pain. Please see problem based charting for evaluation, assessment and plan.  Past Medical History:  Diagnosis Date   Bipolar affective (HCC)    followed by mental health   Gynecomastia 02/07   normal pituitary by MRI   Hyperprolactinemia (HCC) 09/27/04   felt 2/2 lithium and fluphenazine   Hypertension    Renal insufficiency    baseline Cr 2.2 - renal US with increased Echo signal, no nephrology eval, no bx    Review of Systems:  Constitutional: Negative for fever or chills Respiratory: Negative for shortness of breath Cardiac: Negative for chest pain MSK: Negative for back pain Abdomen: Negative for abdominal pain, constipation or diarrhea Neuro: Negative for headache or weakness  Physical Exam: General: Pleasant, well-appearing middle-age male. No acute distress. Cardiac: Mild tachycardia.  Regular rhythm. No murmurs, rubs or gallops. No LE edema Respiratory: Lungs CTAB. No wheezing or crackles. Abdominal: Soft, symmetric and non tender. Normal BS.  No organomegaly Skin: Warm, dry and intact without rashes or lesions Extremities: Atraumatic. Full ROM. Palpable radial pulses.  Neuro: A&O x 3. Moves all extremities   Vitals:   02/26/21 0910 02/26/21 0949  BP: (!) 115/111 (!) 152/90  Pulse: (!) 105 98  Temp: 98.2 F (36.8 C)   TempSrc: Oral   SpO2: 98%   Weight: 207 lb 12.8 oz (94.3 kg)   Height: 5\' 10"  (1.778 m)     Assessment & Plan:   See Encounters Tab for problem based charting.  Patient discussed with Dr. , MD, MPH

## 2021-02-26 NOTE — Assessment & Plan Note (Signed)
Patient seen in the ER on 2/4 for heartburn and found to have SBP in the 180s.  Work-up was negative for an abdominal pathology or ACS.  Patient's amlodipine was resumed and advised to follow-up with PCP.  Patient's BP elevated to 152/90 today.  She has no acute complaints and wants refill on his medications.  Plan: --Refill lisinopril-HCTZ 20-12.5 mg daily --Refill amlodipine 10 mg daily --OV in 3 months for recheck BMP and BP

## 2021-02-26 NOTE — Assessment & Plan Note (Signed)
Patient readmitted back to the hospital for nausea/vomiting and was found to meet SIRS criteria as well as radiological evidence of toxic megacolon.  He underwent colonoscopy with decompression by GI on 10/05 and found to have 9 mm colonic polyp near the hepatic flexure.  This was not removed due to poor visualization.  Patient recently in the ER for heartburn/nausea.  Currently, he denies any abdominal pain, nausea, vomiting, diarrhea or constipation. 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 Will refer patient back to GI to rule out IBD.  Plan: -- Referral to GI

## 2021-02-27 LAB — MICROALBUMIN / CREATININE URINE RATIO
Creatinine, Urine: 55 mg/dL
Microalb/Creat Ratio: 2928 mg/g creat — ABNORMAL HIGH (ref 0–29)
Microalbumin, Urine: 1610.2 ug/mL

## 2021-02-27 NOTE — Progress Notes (Signed)
Internal Medicine Clinic Attending  Case discussed with Dr. Amponsah  At the time of the visit.  We reviewed the resident's history and exam and pertinent patient test results.  I agree with the assessment, diagnosis, and plan of care documented in the resident's note.  

## 2021-02-28 ENCOUNTER — Encounter: Payer: Self-pay | Admitting: Student

## 2021-02-28 DIAGNOSIS — E1122 Type 2 diabetes mellitus with diabetic chronic kidney disease: Secondary | ICD-10-CM | POA: Diagnosis not present

## 2021-02-28 DIAGNOSIS — Z23 Encounter for immunization: Secondary | ICD-10-CM

## 2021-02-28 NOTE — Assessment & Plan Note (Signed)
UA microalbumin/creatinine ratio elevated to ~3000.  This is a significant increase from 373 three years ago.  Patient currently on an ACEi. He will need a UA at next office visit to check for sediment.  Consider maxing out ACE inhibitor if BP not at goal and further evaluation for glomerular disease if proteinuria does not improve.  -- Check UA, repeat UA/CR at next office visit -- Maximize ACE inhibitor

## 2021-03-01 ENCOUNTER — Other Ambulatory Visit (HOSPITAL_COMMUNITY): Payer: Self-pay

## 2021-03-02 ENCOUNTER — Other Ambulatory Visit (HOSPITAL_COMMUNITY): Payer: Self-pay

## 2021-03-02 ENCOUNTER — Other Ambulatory Visit: Payer: Self-pay | Admitting: Student

## 2021-03-02 DIAGNOSIS — F3170 Bipolar disorder, currently in remission, most recent episode unspecified: Secondary | ICD-10-CM

## 2021-03-02 MED ORDER — ZIPRASIDONE HCL 80 MG PO CAPS: 80.0000 mg | ORAL_CAPSULE | Freq: Two times a day (BID) | ORAL | 3 refills | Status: DC

## 2021-03-02 MED ORDER — ZIPRASIDONE HCL 80 MG PO CAPS
80.0000 mg | ORAL_CAPSULE | Freq: Two times a day (BID) | ORAL | 0 refills | Status: DC
  Filled 2021-03-02: qty 14, 7d supply, fill #0

## 2021-03-02 NOTE — Telephone Encounter (Signed)
DECISION VIA MOM :     APPROVED    ( I told mom to take the letter to the pharmacy and collect pt meds and to bring a copy to  the office next week when she cans so I can scan it to the chart )      DR  was also able to get  10 day supply from on site pharmacy

## 2021-03-02 NOTE — Telephone Encounter (Signed)
Social worker.  I am glad we were able to get him the medications.

## 2021-03-02 NOTE — Telephone Encounter (Signed)
Patient's mother walked in requesting assistance with receiving Geodon. Per PA staff, the Pharmacy has done an override and insurance still will not pay for it. PA staff discussed this with Provider who will research an alternative.

## 2021-03-05 ENCOUNTER — Other Ambulatory Visit (HOSPITAL_COMMUNITY): Payer: Self-pay

## 2021-03-05 NOTE — Telephone Encounter (Signed)
Thanks for the updates.

## 2021-03-05 NOTE — Telephone Encounter (Signed)
Resubmitted PA on CoverMyMeds to [facility]. PA approved for pt's Ziprasidone 80mg  from 03/05/21-03/05/22.  Patient notified. Calling [facility] on [address] to transfer rx to their store from [facility] per pt request.   Approval letter will be scanned into pt's chart.

## 2021-03-05 NOTE — Telephone Encounter (Signed)
UPDATE:       Mom brought the letter and the medication is actually denied .[name] I called [place] [employer]  to find out why on the notice it says pt is on  1440 units of the medication and he is actually on caps .. I was given another number  to call  which was actually a fax number [phone]..  I will reach out to the pharmacist again for help ...      Intake call refer number Alfonso Patten- [id number]  Person who helped me refer number - R- [phone]

## 2021-03-08 ENCOUNTER — Telehealth: Payer: Self-pay

## 2021-03-08 NOTE — Telephone Encounter (Signed)
Pa came through again on cover my meds for pt ( ZIPRASIDONE  HCL 80 MG CAPS ) was submitted with office notes and liver function lab work .. was also submitted to through a different insurance (  [employer]  [place] MEDICAID  ASAP : adult safety with antipsychotic prescribing PA form  )      UPDATE :    Your PA has been faxed to the plan as a paper copy. Please contact the plan directly if you haven't received a determination in a typical timeframe.  You will be notified of the determination via fax.

## 2021-03-08 NOTE — Telephone Encounter (Signed)
DECISION :::       LETTER FROM  INSURANCE      This medication you are requesting on this prior authorization  form has already been approved on 03/05/2021 .[name] if the pharmacy is having issues processing the claim , please have them call the pharmacy service for assistance.     ( Copy sent to pharmacy also )

## 2021-06-26 ENCOUNTER — Encounter: Payer: Self-pay | Admitting: Internal Medicine

## 2021-06-26 ENCOUNTER — Other Ambulatory Visit: Payer: Self-pay

## 2021-06-26 ENCOUNTER — Ambulatory Visit: Payer: Medicaid Other | Admitting: Internal Medicine

## 2021-06-26 VITALS — BP 133/84 | HR 85 | Temp 98.1°F | Ht 70.0 in | Wt 214.8 lb

## 2021-06-26 DIAGNOSIS — I129 Hypertensive chronic kidney disease with stage 1 through stage 4 chronic kidney disease, or unspecified chronic kidney disease: Secondary | ICD-10-CM | POA: Diagnosis not present

## 2021-06-26 DIAGNOSIS — E119 Type 2 diabetes mellitus without complications: Secondary | ICD-10-CM | POA: Diagnosis not present

## 2021-06-26 DIAGNOSIS — E7849 Other hyperlipidemia: Secondary | ICD-10-CM

## 2021-06-26 DIAGNOSIS — N1831 Chronic kidney disease, stage 3a: Secondary | ICD-10-CM | POA: Diagnosis not present

## 2021-06-26 DIAGNOSIS — F3170 Bipolar disorder, currently in remission, most recent episode unspecified: Secondary | ICD-10-CM | POA: Diagnosis not present

## 2021-06-26 DIAGNOSIS — Z87891 Personal history of nicotine dependence: Secondary | ICD-10-CM

## 2021-06-26 DIAGNOSIS — E1122 Type 2 diabetes mellitus with diabetic chronic kidney disease: Secondary | ICD-10-CM

## 2021-06-26 DIAGNOSIS — I10 Essential (primary) hypertension: Secondary | ICD-10-CM | POA: Diagnosis not present

## 2021-06-26 MED ORDER — LISINOPRIL-HYDROCHLOROTHIAZIDE 20-12.5 MG PO TABS: 1.0000 | ORAL_TABLET | Freq: Every day | ORAL | 11 refills | Status: DC

## 2021-06-26 MED ORDER — RYBELSUS 7 MG PO TABS: 7.0000 mg | ORAL_TABLET | Freq: Every day | ORAL | 1 refills | Status: DC

## 2021-06-26 MED ORDER — ATORVASTATIN CALCIUM 40 MG PO TABS: 40.0000 mg | ORAL_TABLET | Freq: Every day | ORAL | 3 refills | Status: DC

## 2021-06-26 MED ORDER — ZIPRASIDONE HCL 80 MG PO CAPS: 80.0000 mg | ORAL_CAPSULE | Freq: Two times a day (BID) | ORAL | 3 refills | Status: DC

## 2021-06-26 MED ORDER — AMLODIPINE BESYLATE 10 MG PO TABS: 10.0000 mg | ORAL_TABLET | Freq: Every day | ORAL | 3 refills | Status: DC

## 2021-06-26 MED ORDER — DIVALPROEX SODIUM ER 500 MG PO TB24: 500.0000 mg | ORAL_TABLET | Freq: Every day | ORAL | 1 refills | Status: DC

## 2021-06-26 NOTE — Assessment & Plan Note (Signed)
Pt endorses no complaints today. Refilled Rybelsus today. -Obtained urine microalbumin/cr ratio, will contact patient with results.

## 2021-06-26 NOTE — Patient Instructions (Signed)
Thank you, [name] for allowing [place] to provide your care today. Today we discussed your medications.  I have refilled all of your medications for the next several months.  I am obtaining some labwork today. I will contact you if these are abnormal.    I have ordered the following labs for you:  Lab Orders         BMP8+Anion Gap         Lipid Profile         Microalbumin / Creatinine Urine Ratio       Referrals ordered today:   Referral Orders  No referral(s) requested today     I have ordered the following medication/changed the following medications:    Start the following medications: Meds ordered this encounter  Medications   amLODipine (NORVASC) 10 MG tablet    Sig: Take 1 tablet (10 mg total) by mouth daily.    Dispense:  90 tablet    Refill:  3   atorvastatin (LIPITOR) 40 MG tablet    Sig: Take 1 tablet (40 mg total) by mouth daily.    Dispense:  90 tablet    Refill:  3    Please cancel the 80mg  dose of atorvastatin   lisinopril-hydrochlorothiazide (ZESTORETIC) 20-12.5 MG tablet    Sig: Take 1 tablet by mouth daily.    Dispense:  30 tablet    Refill:  11   divalproex (DEPAKOTE ER) 500 MG 24 hr tablet    Sig: Take 1 tablet (500 mg total) by mouth at bedtime.    Dispense:  90 tablet    Refill:  1   Semaglutide (RYBELSUS) 7 MG TABS    Sig: Take 7 mg by mouth daily.    Dispense:  90 tablet    Refill:  1    For diabetes   ziprasidone (GEODON) 80 MG capsule    Sig: Take 1 capsule (80 mg total) by mouth 2 (two) times daily.    Dispense:  90 capsule    Refill:  3     Follow up: 6 months    Should you have any questions or concerns please call the internal medicine clinic at [phone].

## 2021-06-26 NOTE — Assessment & Plan Note (Addendum)
BP Readings from Last 3 Encounters:  06/26/21 133/84  02/26/21 (!) 152/90  02/24/21 (!) 167/106   The patient is here today for routine follow-up and medication refills.  Endorses compliance with his medications at home.  States that he took his blood pressure medications this morning.  Plan: Refilled Zestoretic and amlodipine today.  We will obtain BMP and contact patient with results if abnormal.   Addendum: Microalbumin/Cr ratio in the 1000s. Pt contacted with results. Unfortunately, zestoretic does not come with a 40-12.5 option, so will need to split up for patient to take lisinopril 40 mg daily and HCTZ 12.5 mg daily. Explained this in detail to patient and he was in agreement with this plan.

## 2021-06-26 NOTE — Assessment & Plan Note (Signed)
-  Refilled atorvastatin -Repeat lipid panel today

## 2021-06-26 NOTE — Progress Notes (Signed)
   CC: medication refill  HPI:  [name] is a 46 y.o. with past medical history as noted below who presents to the clinic today for medication refills. Please see problem-based list for further details, assessments, and plans.   Past Medical History:  Diagnosis Date   Bipolar affective ([place])    followed by mental health   Gynecomastia 02/07   normal pituitary by MRI   Hyperprolactinemia ([place]) 09/27/04   felt 2/2 lithium and fluphenazine   Hypertension    Renal insufficiency    baseline Cr 2.2 - renal US with increased Echo signal, no nephrology eval, no bx   Review of Systems: Negative aside from that listed in individualized problem based charting.   Physical Exam:  Vitals:   06/26/21 0838 06/26/21 0846  BP: (!) 142/84 133/84  Pulse: 92 85  Temp: 98.1 F (36.7 C)   TempSrc: Oral   SpO2: 100%   Weight: 214 lb 12.8 oz (97.4 kg)   Height: 5\' 10"  (1.778 m)    General: NAD, nl appearance HE: Normocephalic, atraumatic, EOMI, Conjunctivae normal ENT: No congestion, no rhinorrhea, no exudate or erythema  Cardiovascular: Normal rate, regular rhythm. No murmurs, rubs, or gallops Pulmonary: Effort normal, breath sounds normal. No wheezes, rales, or rhonchi Abdominal: soft, nontender, bowel sounds present Musculoskeletal: no swelling, deformity, injury or tenderness in extremities Skin: Warm, dry, no bruising, erythema, or rash Psychiatric/Behavioral: normal mood, normal behavior     Assessment & Plan:   See Encounters Tab for problem based charting.  Patient discussed with Dr.  Saverio Danker

## 2021-06-27 LAB — LIPID PANEL
Chol/HDL Ratio: 3.9 ratio (ref 0.0–5.0)
Cholesterol, Total: 157 mg/dL (ref 100–199)
HDL: 40 mg/dL (ref >39)
LDL Chol Calc (NIH): 90 mg/dL (ref 0–99)
Triglycerides: 155 mg/dL — ABNORMAL HIGH (ref 0–149)
VLDL Cholesterol Cal: 27 mg/dL (ref 5–40)

## 2021-06-27 LAB — BMP8+ANION GAP
Anion Gap: 19 mmol/L — ABNORMAL HIGH (ref 10.0–18.0)
BUN/Creatinine Ratio: 11 (ref 9–20)
BUN: 23 mg/dL (ref 6–24)
CO2: 23 mmol/L (ref 20–29)
Calcium: 9.7 mg/dL (ref 8.7–10.2)
Chloride: 104 mmol/L (ref 96–106)
Creatinine, Ser: 2.09 mg/dL — ABNORMAL HIGH (ref 0.76–1.27)
Glucose: 114 mg/dL — ABNORMAL HIGH (ref 70–99)
Potassium: 3.5 mmol/L (ref 3.5–5.2)
Sodium: 146 mmol/L — ABNORMAL HIGH (ref 134–144)
eGFR: 39 mL/min/{1.73_m2} — ABNORMAL LOW (ref >59)

## 2021-06-27 LAB — MICROALBUMIN / CREATININE URINE RATIO
Creatinine, Urine: 45.7 mg/dL
Microalb/Creat Ratio: 1845 mg/g creat — ABNORMAL HIGH (ref 0–29)
Microalbumin, Urine: 843.2 ug/mL

## 2021-06-27 NOTE — Addendum Note (Signed)
Addended by: Charise Killian on: 06/27/2021 09:26 AM   Modules accepted: Level of Service

## 2021-06-27 NOTE — Progress Notes (Signed)
Internal Medicine Clinic Attending ° °Case discussed with Dr. Bonanno  °  At the time of the visit.  We reviewed the resident’s history and exam and pertinent patient test results.  I agree with the assessment, diagnosis, and plan of care documented in the resident’s note. ° °

## 2021-06-29 MED ORDER — HYDROCHLOROTHIAZIDE 12.5 MG PO TABS: 12.5000 mg | ORAL_TABLET | Freq: Every day | ORAL | 2 refills | Status: DC

## 2021-06-29 MED ORDER — LISINOPRIL 20 MG PO TABS: 40.0000 mg | ORAL_TABLET | Freq: Every day | ORAL | 11 refills | Status: DC

## 2021-06-29 NOTE — Addendum Note (Signed)
Addended by: Orvis Brill on: 06/29/2021 01:04 PM   Modules accepted: Orders

## 2021-06-29 NOTE — Addendum Note (Signed)
Addended byCarmel Sacramento on: 06/29/2021 01:00 PM   Modules accepted: Orders

## 2021-06-29 NOTE — Addendum Note (Signed)
Addended byCarmel Sacramento on: 06/29/2021 01:03 PM   Modules accepted: Orders

## 2021-07-14 ENCOUNTER — Encounter: Payer: Self-pay | Admitting: *Deleted

## 2021-09-18 ENCOUNTER — Other Ambulatory Visit: Payer: Self-pay | Admitting: Internal Medicine

## 2021-09-18 DIAGNOSIS — I10 Essential (primary) hypertension: Secondary | ICD-10-CM

## 2021-10-11 ENCOUNTER — Ambulatory Visit: Payer: Medicaid Other | Admitting: Student

## 2021-10-11 ENCOUNTER — Encounter: Payer: Self-pay | Admitting: Student

## 2021-10-11 ENCOUNTER — Telehealth: Payer: Self-pay

## 2021-10-11 VITALS — BP 150/100 | HR 88 | Temp 99.0°F | Ht 70.0 in | Wt 219.1 lb

## 2021-10-11 DIAGNOSIS — I10 Essential (primary) hypertension: Secondary | ICD-10-CM | POA: Diagnosis not present

## 2021-10-11 DIAGNOSIS — F3170 Bipolar disorder, currently in remission, most recent episode unspecified: Secondary | ICD-10-CM

## 2021-10-11 DIAGNOSIS — Z23 Encounter for immunization: Secondary | ICD-10-CM | POA: Diagnosis not present

## 2021-10-11 DIAGNOSIS — Z0000 Encounter for general adult medical examination without abnormal findings: Secondary | ICD-10-CM

## 2021-10-11 DIAGNOSIS — E7849 Other hyperlipidemia: Secondary | ICD-10-CM | POA: Diagnosis not present

## 2021-10-11 DIAGNOSIS — E119 Type 2 diabetes mellitus without complications: Secondary | ICD-10-CM

## 2021-10-11 DIAGNOSIS — Z87891 Personal history of nicotine dependence: Secondary | ICD-10-CM

## 2021-10-11 LAB — POCT GLYCOSYLATED HEMOGLOBIN (HGB A1C): Hemoglobin A1C: 6.3 % — AB (ref 4.0–5.6)

## 2021-10-11 LAB — GLUCOSE, CAPILLARY: Glucose-Capillary: 104 mg/dL — ABNORMAL HIGH (ref 70–99)

## 2021-10-11 MED ORDER — ZIPRASIDONE HCL 80 MG PO CAPS: 80.0000 mg | ORAL_CAPSULE | Freq: Two times a day (BID) | ORAL | 3 refills | Status: DC

## 2021-10-11 MED ORDER — HYDROCHLOROTHIAZIDE 12.5 MG PO TABS: 12.5000 mg | ORAL_TABLET | Freq: Every day | ORAL | 2 refills | Status: DC

## 2021-10-11 MED ORDER — DIVALPROEX SODIUM ER 500 MG PO TB24: 500.0000 mg | ORAL_TABLET | Freq: Every day | ORAL | 2 refills | Status: DC

## 2021-10-11 MED ORDER — LISINOPRIL 20 MG PO TABS: 40.0000 mg | ORAL_TABLET | Freq: Every day | ORAL | 3 refills | Status: DC

## 2021-10-11 MED ORDER — ATORVASTATIN CALCIUM 40 MG PO TABS: 40.0000 mg | ORAL_TABLET | Freq: Every day | ORAL | 2 refills | Status: DC

## 2021-10-11 MED ORDER — AMLODIPINE BESYLATE 10 MG PO TABS: 10.0000 mg | ORAL_TABLET | Freq: Every day | ORAL | 2 refills | Status: DC

## 2021-10-11 MED ORDER — RYBELSUS 7 MG PO TABS: 7.0000 mg | ORAL_TABLET | Freq: Every day | ORAL | 2 refills | Status: DC

## 2021-10-11 NOTE — Patient Instructions (Addendum)
Today we discussed health promotion.  You seem to be doing really well.  Your blood pressure was high in clinic today, but based on your home results I do not think we need to change your medications today. Please keep records of your home blood pressure and bring them with you to your next clinic visit.  I have ordered 6 months worth of refills for all your medicines.  You received the flu shot today.  Return to the clinic in 6 months for a follow-up visit.    I will call you with the results of the following laboratory tests:   Lab Orders         Glucose, capillary         POC Hbg A1C      Expect a call from the offices of the following departments:   Referral Orders         Ambulatory referral to Gastroenterology     Please call our clinic at [phone] Monday through Friday from 9 am to 4 pm if you have questions or concerns about your health. If after hours or on the weekend, call the main hospital number and ask for the Internal Medicine Resident On-Call. If you need medication refills, please notify your pharmacy one week in advance and they will send [place] a request.   Best, Nani Gasser, [place]

## 2021-10-11 NOTE — Assessment & Plan Note (Signed)
Refilled ziprasidone and divalproex

## 2021-10-11 NOTE — Assessment & Plan Note (Signed)
Hemoglobin A1c of 6.3%, up from 5.7% at last visit.  BMI 31.44.  Patient doing well with mostly diet controlled diabetes and on a stable regimen of oral semaglutide.  May be worth having a conversation about switching to injectable semaglutide for better control of overweight. - Refilled semaglutide 7 mg p.o.

## 2021-10-11 NOTE — Telephone Encounter (Signed)
Prior Authorization for patient (Rybelsus) came through on cover my meds awaiting approval or denial

## 2021-10-11 NOTE — Assessment & Plan Note (Signed)
BP 150/100 in clinic today.  He measured his blood pressure at home and reports that it is typically 120s over 80s.  He feels well and is asymptomatic.  He is adherent to his regimen of amlodipine, HCTZ, lisinopril.  46 year old male with previously well-controlled hypertension presents to clinic today with blood pressure of 150/100.  Based on home results I do not feel strongly about treating this BP today.  I requested that the patient maintain a log of his blood pressure between now and his next visit and asked that he bring this log with him. - Continue amlodipine 10 mg - Continue HCTZ 12.5 mg - Continue lisinopril 40 mg

## 2021-10-11 NOTE — Assessment & Plan Note (Signed)
Flu vaccine administered today.

## 2021-10-11 NOTE — Progress Notes (Signed)
Subjective:  Reason for visit: Routine follow up and refills  HPI:  [name] is a 46 y.o. male with history of hypertension, hyperlipidemia, diabetes who presents today for routine follow-up and medication refill. Please see problem based assessment and plan for additional details.  Past Medical History:  Diagnosis Date   Bipolar affective ([place])    followed by mental health   Gynecomastia 02/07   normal pituitary by MRI   Hyperprolactinemia ([place]) 09/27/04   felt 2/2 lithium and fluphenazine   Hypertension    Renal insufficiency    baseline Cr 2.2 - renal US with increased Echo signal, no nephrology eval, no bx    Current Outpatient Medications on File Prior to Visit  Medication Sig Dispense Refill   acetaminophen (TYLENOL) 325 MG tablet Take 2 tablets (650 mg total) by mouth every 6 (six) hours as needed. (Patient taking differently: Take 650 mg by mouth every 6 (six) hours as needed for mild pain, fever or headache.) 15 tablet 0   No current facility-administered medications on file prior to visit.    Family History  Problem Relation Age of Onset   Hyperlipidemia Mother    Hypertension Mother    Atrial fibrillation Mother    Hypertension Brother    Hypertension Maternal Grandmother    Cancer Maternal Grandmother     Social History   Socioeconomic History   Marital status: Single    Spouse name: Not on file   Number of children: Not on file   Years of education: Not on file   Highest education level: Not on file  Occupational History   Not on file  Tobacco Use   Smoking status: Former    Types: Cigarettes    Quit date: 01/22/1983    Years since quitting: 38.7   Smokeless tobacco: Never  Vaping Use   Vaping Use: Never used  Substance and Sexual Activity   Alcohol use: No    Alcohol/week: 0.0 standard drinks of alcohol   Drug use: No   Sexual activity: Not on file  Other Topics Concern   Not on file  Social History Narrative   Not on file    Social Determinants of Health   Financial Resource Strain: Not on file  Food Insecurity: Not on file  Transportation Needs: Not on file  Physical Activity: Not on file  Stress: Not on file  Social Connections: Not on file  Intimate Partner Violence: Not on file    Review of Systems: ROS negative except for what is noted on the assessment and plan.  Objective:   Vitals:   10/11/21 1345 10/11/21 1347  BP: (!) 192/125 (!) 150/100  Pulse: 90 88  Temp: 99 F (37.2 C)   TempSrc: Oral   SpO2: 99%   Weight: 219 lb 1.6 oz (99.4 kg)   Height: 5\' 10"  (1.778 m)     Physical Exam Constitutional:      General: He is not in acute distress.    Appearance: Normal appearance.  HENT:     Mouth/Throat:     Mouth: Mucous membranes are moist.  Cardiovascular:     Rate and Rhythm: Normal rate and regular rhythm.     Pulses: Normal pulses.  Pulmonary:     Effort: Pulmonary effort is normal.     Breath sounds: Normal breath sounds. No stridor.  Abdominal:     Palpations: Abdomen is soft.     Tenderness: There is no abdominal tenderness.  Musculoskeletal:  Right lower leg: No edema.     Left lower leg: No edema.  Lymphadenopathy:     Cervical: No cervical adenopathy.  Skin:    General: Skin is warm and dry.  Neurological:     Mental Status: He is alert. Mental status is at baseline.  Psychiatric:        Mood and Affect: Mood normal.        Behavior: Behavior normal.       Assessment & Plan:  Essential hypertension BP 150/100 in clinic today.  He measured his blood pressure at home and reports that it is typically 120s over 80s.  He feels well and is asymptomatic.  He is adherent to his regimen of amlodipine, HCTZ, lisinopril.  46 year old male with previously well-controlled hypertension presents to clinic today with blood pressure of 150/100.  Based on home results I do not feel strongly about treating this BP today.  I requested that the patient maintain a log of his  blood pressure between now and his next visit and asked that he bring this log with him. - Continue amlodipine 10 mg - Continue HCTZ 12.5 mg - Continue lisinopril 40 mg  Type 2 diabetes mellitus treated without insulin (HCC) Hemoglobin A1c of 6.3%, up from 5.7% at last visit.  BMI 31.44.  Patient doing well with mostly diet controlled diabetes and on a stable regimen of oral semaglutide.  May be worth having a conversation about switching to injectable semaglutide for better control of overweight. - Refilled semaglutide 7 mg p.o.  Bipolar affective (H[facility]) Refilled ziprasidone and divalproex  Healthcare maintenance Flu vaccine administered today    Return in about 6 months (around 04/11/2022) for Diabetes, HTN, HLD.  Patient seen with Dr. Claris Gladden, M.D. [facility] Internal [facility]  PGY-1 Pager: [phone] Date 10/11/2021  Time 6:03 PM

## 2021-10-12 NOTE — Progress Notes (Signed)
Internal Medicine Clinic Attending  I saw and evaluated the patient.  I personally confirmed the key portions of the history and exam documented by Dr. McLendon and I reviewed pertinent patient test results.  The assessment, diagnosis, and plan were formulated together and I agree with the documentation in the resident's note.  

## 2021-10-18 NOTE — Telephone Encounter (Signed)
Thanks for the update. Looks the rybelsus is mostly for weight loss. Will need to discuss injection GLP-1 agonist vs other weight loss at his next visit.

## 2021-10-18 NOTE — Telephone Encounter (Signed)
Kyle Montgomery (Key: [id number]) [phone] Rybelsus 7MG  tablets Outcome: DeniedCreated: September 21st, 2023 [phone]: September 21st, 2023

## 2021-12-08 ENCOUNTER — Emergency Department (HOSPITAL_COMMUNITY)
Admission: EM | Admit: 2021-12-08 | Discharge: 2021-12-08 | Discharge status: Against Medical Advice | Payer: Medicaid Other

## 2021-12-08 NOTE — ED Notes (Signed)
Patient states he came to the emergency department because his home blood pressure cuff was telling him his blood pressure was abnormal. Patient states that he measured it once and his blood pressure was high, then again and it was low so he came to hospital for further evaluation. Upon hearing his blood pressure was 135/88 in triage patient stated he was no longer concerned about his blood pressure and believes her concern was a mistake and he no longer wants to be seen.

## 2022-02-04 NOTE — Progress Notes (Addendum)
Subjective:  CC: blood pressure and diabetes  HPI:  [name] is a 47 y.o. male with a past medical history stated below and presents today for blood pressure and diabetes follow up. Please see problem based assessment and plan for additional details.  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 (Patient taking differently: Take 650 mg by mouth every 6 (six) hours as needed for mild pain, fever or headache.) 15 tablet 0   No current facility-administered medications on file prior to visit.    Family History  Problem Relation Age of Onset   Hyperlipidemia Mother    Hypertension Mother    Atrial fibrillation Mother    Hypertension Brother    Hypertension Maternal Grandmother    Cancer Maternal Grandmother     Social History   Socioeconomic History   Marital status: Single    Spouse name: Not on file   Number of children: Not on file   Years of education: Not on file   Highest education level: Not on file  Occupational History   Not on file  Tobacco Use   Smoking status: Former    Types: Cigarettes    Quit date: 01/22/1983    Years since quitting: 39.0   Smokeless tobacco: Never  Vaping Use   Vaping Use: Never used  Substance and Sexual Activity   Alcohol use: No    Alcohol/week: 0.0 standard drinks of alcohol   Drug use: No   Sexual activity: Not on file  Other Topics Concern   Not on file  Social History Narrative   Not on file   Social Determinants of Health    Financial Resource Strain: Not on file  Food Insecurity: Not on file  Transportation Needs: Not on file  Physical Activity: Not on file  Stress: Not on file  Social Connections: Not on file  Intimate Partner Violence: Not on file    Review of Systems: ROS negative except for what is noted on the assessment and plan.  Objective:   Vitals:   02/05/22 0852 02/05/22 0934  BP: (!) 140/92 (!) 147/94  Pulse: 89 87  Temp: 98.1 F (36.7 C)   TempSrc: Oral   SpO2: 100%   Weight: 219 lb 12.8 oz (99.7 kg)   Height: 5\' 10"  (1.778 m)     Physical Exam: Constitutional: well-appearing man sitting in chair, in no acute distress HENT: normocephalic atraumatic, mucous membranes moist Eyes: conjunctiva non-erythematous Neck: supple Cardiovascular: regular rate and rhythm, no m/r/g Pulmonary/Chest: normal work of breathing on room air, lungs clear to auscultation bilaterally Abdominal: soft, non-tender, non-distended MSK: normal bulk and tone Neurological: alert & oriented x 3, 5/5 strength in bilateral upper and lower extremities, normal gait Skin: warm and dry Psych: Normal mood and affect     Assessment & Plan:   Essential hypertension Last OV: 09/2021  150/100 but patient's home log was lower than OV BP reading. On  11/18 visit to the ED for BP check as he was worried for home pressure reads but it was wnl. Today patient returns for HTN follow up.  His home reading log: SBP140-150 and DBP 90-100. Patient has been eating less sodium and more vegetables and fruit, less potato chips. Denies hypertensive symptoms.  Today: Vitals:   02/05/22 0852 02/05/22 0934  BP: (!) 140/92 (!) 147/94    Patient continued to  be hypertensive at OV and on home logs, warranting escalating therapy. Last BMP 06/2021: Cr 2.0 GFR 39. Patient won't be able to pick up medications  until the 1st of the month. Advised to continue log, observe symptoms. Precautionary signs and symptoms were reviewed. Plan: -  Start Zestoretic 20mg -12.5 mg x tablets daily - Start Jardiance 10 mg daily for BP, CKD, and DM management -Continue Amlodipine  -f/u renal function today  Type 2 diabetes mellitus treated without insulin ([place]) 09/2021 A1c 6.3%. BMI:31.54. Patient on Dilvalproex and ziprasidone, likely contributory. He has also been drinking and eating more fruits.  No polydipsia, polyuria, or other catabolic symptoms.   02/05/2022: cBG 115 and A1c 6.5% Home regimen of Rybelsus 7 mg daily. No history of DKA. Patient would benefit from Jardiance therapy in setting of CKD with proteinuria and maintenance of A1c at goal.  -fundus examination today -Jardiance 10 mg daily -Rybelsus 7 mg daily   Addendum - Insurance required PA for Rybelsus and Jardiance. [name] at [facility] is working on this task. Patient will be notified when able to get medication.  CKD (chronic kidney disease), stage III ([place]) Initially thought to be secondary to prior lithium therapy. Now with uncontrolled HTN and managed DM.  ARB/ACEi? Lisinopril 40 mg daily.  HLD - controlled on therapy Edema, muscle cramping, puffiness, n/v, frequent urination? denies Last BMP 06/2021: Cr 2.0 GFR 39 06/2021 urine Microalbumin/ Cr 1845  02/26/2021 urine microalbumin/cr ratio 2928 Patient without hx of DKA P: Patient would benefit from [place] therapy given level of microalbuminuria and max dose of ACEi. Patient will pick up on the first of February. - Start Jardiance 10 mg -If pateint continues to struggle with diabetic diet, consider referral to Diabetes Educator to go over diet with more instruction.   Addendum - Insurance required PA for [employer]. [name] at [facility] is working on this task. Patient will be notified when able to get medication.  Bipolar affective ([place]) Denies manic symptoms, depressed mood, or extrapyramidal side effects Ziprasidone and divalproex Last BMP 06/2021: Cr 2.0 GFR 39, 02/2021 Cr 1.91 GFR 44 Last LFTs 02/2021  wnl -Continue Ziprasidone 80 mg and depakote 500 mg daily -Refills today -CMP today  Hyperlipidemia Primary prevention. Last LDL 90 06/2021. -Continue on Atorvastatin 40 mg  -Refilled today   Patient  Discussed  Dr. Philipp Ovens

## 2022-02-05 ENCOUNTER — Other Ambulatory Visit: Payer: Self-pay

## 2022-02-05 ENCOUNTER — Encounter (INDEPENDENT_AMBULATORY_CARE_PROVIDER_SITE_OTHER): Payer: Medicaid Other | Admitting: Dietician

## 2022-02-05 ENCOUNTER — Ambulatory Visit: Payer: Medicaid Other | Admitting: Dietician

## 2022-02-05 ENCOUNTER — Encounter: Payer: Self-pay | Admitting: Student

## 2022-02-05 ENCOUNTER — Ambulatory Visit: Payer: Medicaid Other | Admitting: Student

## 2022-02-05 ENCOUNTER — Encounter: Payer: Self-pay | Admitting: Dietician

## 2022-02-05 VITALS — BP 147/94 | HR 87 | Temp 98.1°F | Ht 70.0 in | Wt 219.8 lb

## 2022-02-05 DIAGNOSIS — E7849 Other hyperlipidemia: Secondary | ICD-10-CM

## 2022-02-05 DIAGNOSIS — F3170 Bipolar disorder, currently in remission, most recent episode unspecified: Secondary | ICD-10-CM

## 2022-02-05 DIAGNOSIS — E1122 Type 2 diabetes mellitus with diabetic chronic kidney disease: Secondary | ICD-10-CM | POA: Diagnosis not present

## 2022-02-05 DIAGNOSIS — I129 Hypertensive chronic kidney disease with stage 1 through stage 4 chronic kidney disease, or unspecified chronic kidney disease: Secondary | ICD-10-CM

## 2022-02-05 DIAGNOSIS — Z87891 Personal history of nicotine dependence: Secondary | ICD-10-CM

## 2022-02-05 DIAGNOSIS — Z7984 Long term (current) use of oral hypoglycemic drugs: Secondary | ICD-10-CM | POA: Diagnosis not present

## 2022-02-05 DIAGNOSIS — N1832 Chronic kidney disease, stage 3b: Secondary | ICD-10-CM

## 2022-02-05 DIAGNOSIS — I10 Essential (primary) hypertension: Secondary | ICD-10-CM

## 2022-02-05 DIAGNOSIS — E119 Type 2 diabetes mellitus without complications: Secondary | ICD-10-CM | POA: Diagnosis not present

## 2022-02-05 LAB — POCT GLYCOSYLATED HEMOGLOBIN (HGB A1C): Hemoglobin A1C: 6.5 % — AB (ref 4.0–5.6)

## 2022-02-05 LAB — HM DIABETES EYE EXAM

## 2022-02-05 LAB — GLUCOSE, CAPILLARY: Glucose-Capillary: 115 mg/dL — ABNORMAL HIGH (ref 70–99)

## 2022-02-05 MED ORDER — DIVALPROEX SODIUM ER 500 MG PO TB24: 500.0000 mg | ORAL_TABLET | Freq: Every day | ORAL | 3 refills | Status: DC

## 2022-02-05 MED ORDER — EMPAGLIFLOZIN 10 MG PO TABS: 10.0000 mg | ORAL_TABLET | Freq: Every day | ORAL | 3 refills | Status: AC

## 2022-02-05 MED ORDER — LISINOPRIL-HYDROCHLOROTHIAZIDE 20-12.5 MG PO TABS: 2.0000 | ORAL_TABLET | Freq: Every day | ORAL | 3 refills | Status: DC

## 2022-02-05 MED ORDER — RYBELSUS 7 MG PO TABS: 7.0000 mg | ORAL_TABLET | Freq: Every day | ORAL | 3 refills | Status: AC

## 2022-02-05 MED ORDER — ATORVASTATIN CALCIUM 40 MG PO TABS: 40.0000 mg | ORAL_TABLET | Freq: Every day | ORAL | 3 refills | Status: DC

## 2022-02-05 MED ORDER — ZIPRASIDONE HCL 80 MG PO CAPS: 80.0000 mg | ORAL_CAPSULE | Freq: Two times a day (BID) | ORAL | 3 refills | Status: DC

## 2022-02-05 MED ORDER — AMLODIPINE BESYLATE 10 MG PO TABS: 10.0000 mg | ORAL_TABLET | Freq: Every day | ORAL | 3 refills | Status: DC

## 2022-02-05 NOTE — Patient Instructions (Signed)
  You had photos taken of your retinas (part of your eyes).    These photos were sent to eye doctors in [place] to see if diabetes is affecting the back of your eye.    If the doctor in [place] tells [place] that you should see an eye doctor, we will call you.   If the doctor in [place] says to return in 1 year. You will get a copy of the report in the mail.    Thank you for allowing [place] to help with your diabetes and vision care today!   Sincerely,  Debera Lat [phone]

## 2022-02-05 NOTE — Progress Notes (Signed)
Retinal results were abstracted into patient's chart. See Ophthalmology section of Health Maintenance section of his chart for details Debera Lat, RD 02/05/2022 11:39 AM.

## 2022-02-05 NOTE — Assessment & Plan Note (Addendum)
Denies manic symptoms, depressed mood, or extrapyramidal side effects Ziprasidone and divalproex Last BMP 06/2021: Cr 2.0 GFR 39, 02/2021 Cr 1.91 GFR 44 Last LFTs 02/2021 wnl -Continue Ziprasidone 80 mg and depakote 500 mg daily -Refills today -CMP today

## 2022-02-05 NOTE — Assessment & Plan Note (Signed)
Primary prevention. Last LDL 90 06/2021. -Continue on Atorvastatin 40 mg  -Refilled today

## 2022-02-05 NOTE — Progress Notes (Signed)
Internal Medicine Clinic Attending  Case discussed with Dr. Simeon Craft  At the time of the visit.  We reviewed the resident's history and exam and pertinent patient test results.  I agree with the assessment, diagnosis, and plan of care documented in the resident's note.

## 2022-02-05 NOTE — Progress Notes (Signed)
Retinal images were done today as part of this patient's yearly diabetes health maintenance program and per Dr. Simeon Craft.  They were transmitted electronically to an ophthalmologist who will interpret them and send a report back with their findings. This was explained to the patient and they were also informed that the results would be communicated to them either by phone, mail or both.  Kyle Montgomery, RD 02/05/2022 9:20 AM.

## 2022-02-05 NOTE — Assessment & Plan Note (Addendum)
Last OV: 09/2021  150/100 but patient's home log was lower than OV BP reading. On 11/18 visit to the ED for BP check as he was worried for home pressure reads but it was wnl. Today patient returns for HTN follow up.  His home reading log: SBP140-150 and DBP 90-100. Patient has been eating less sodium and more vegetables and fruit, less potato chips. Denies hypertensive symptoms.  Today: Vitals:   02/05/22 0852 02/05/22 0934  BP: (!) 140/92 (!) 147/94    Patient continued to  be hypertensive at OV and on home logs, warranting escalating therapy. Last BMP 06/2021: Cr 2.0 GFR 39. Patient won't be able to pick up medications  until the 1st of the month. Advised to continue log, observe symptoms. Precautionary signs and symptoms were reviewed. Plan: - Start Zestoretic 20mg -12.5 mg x tablets daily - Start Jardiance 10 mg daily for BP, CKD, and DM management -Continue Amlodipine  -f/u renal function today

## 2022-02-05 NOTE — Assessment & Plan Note (Addendum)
Initially thought to be secondary to prior lithium therapy. Now with uncontrolled HTN and managed DM.  ARB/ACEi? Lisinopril 40 mg daily.  HLD - controlled on therapy Edema, muscle cramping, puffiness, n/v, frequent urination? denies Last BMP 06/2021: Cr 2.0 GFR 39 06/2021 urine Microalbumin/ Cr 1845  02/26/2021 urine microalbumin/cr ratio 2928 Patient without hx of DKA P: Patient would benefit from [place] therapy given level of microalbuminuria and max dose of ACEi. Patient will pick up on the first of February. - Start Jardiance 10 mg -If pateint continues to struggle with diabetic diet, consider referral to Diabetes Educator to go over diet with more instruction.   Addendum - Insurance required PA for [employer]. [name] at [facility] is working on this task. Patient will be notified when able to get medication.

## 2022-02-05 NOTE — Patient Instructions (Addendum)
Thank you, [name] for allowing [place] to provide your care today. Today we discussed your blood pressure, your diabetes, your bipolar disorder, and medication refills.  When you are able to pick your medications, please  Start:  Zestoric  (lisinopril-hydrochlorothiazide - 2 tablets of that medication daily Jardiance - one 10 mg table daily  STOP Single bottle of Lisinopril Single bottle of HCTZ  Continue your other medications as you had been doing.    I have ordered the following labs for you:  Lab Orders         CMP14 + Anion Gap         POC Hbg A1C     Retinal exam   I will call if any are abnormal. All of your labs can be accessed through "My Chart".   My Chart Access: [email or web address]  Please follow-up in 3 months  Please make sure to arrive 15 minutes prior to your next appointment. If you arrive late, you may be asked to reschedule.    We look forward to seeing you next time. Please call our clinic at [phone] if you have any questions or concerns. The best time to call is Monday-Friday from 9am-4pm, but there is someone available 24/7. If after hours or the weekend, call the main hospital number and ask for the Internal Medicine Resident On-Call. If you need medication refills, please notify your pharmacy one week in advance and they will send [place] a request.   Thank you for letting us take part in your care. Wishing you the best!  Romana Juniper, MD 02/05/2022, 9:00 AM [facility] Internal Medicine Resident, PGY-1

## 2022-02-05 NOTE — Assessment & Plan Note (Addendum)
09/2021 A1c 6.3%. BMI:31.54. Patient on Dilvalproex and ziprasidone, likely contributory. He has also been drinking and eating more fruits.  No polydipsia, polyuria, or other catabolic symptoms.   02/05/2022: cBG 115 and A1c 6.5% Home regimen of Rybelsus 7 mg daily. No history of DKA. Patient would benefit from Jardiance therapy in setting of CKD with proteinuria and maintenance of A1c at goal.  -fundus examination today -Jardiance 10 mg daily -Rybelsus 7 mg daily   Addendum - Insurance required PA for Rybelsus and Jardiance. [name] at [facility] is working on this task. Patient will be notified when able to get medication.

## 2022-02-06 ENCOUNTER — Other Ambulatory Visit: Payer: Self-pay | Admitting: Student

## 2022-02-06 ENCOUNTER — Telehealth: Payer: Self-pay

## 2022-02-06 DIAGNOSIS — N1832 Chronic kidney disease, stage 3b: Secondary | ICD-10-CM

## 2022-02-06 LAB — CMP14 + ANION GAP
ALT: 16 IU/L (ref 0–44)
AST: 26 IU/L (ref 0–40)
Albumin/Globulin Ratio: 1.4 (ref 1.2–2.2)
Albumin: 4.3 g/dL (ref 4.1–5.1)
Alkaline Phosphatase: 86 IU/L (ref 44–121)
Anion Gap: 18 mmol/L (ref 10.0–18.0)
BUN/Creatinine Ratio: 12 (ref 9–20)
BUN: 25 mg/dL — ABNORMAL HIGH (ref 6–24)
Bilirubin Total: 0.3 mg/dL (ref 0.0–1.2)
CO2: 20 mmol/L (ref 20–29)
Calcium: 9.6 mg/dL (ref 8.7–10.2)
Chloride: 104 mmol/L (ref 96–106)
Creatinine, Ser: 2.12 mg/dL — ABNORMAL HIGH (ref 0.76–1.27)
Globulin, Total: 3 g/dL (ref 1.5–4.5)
Glucose: 121 mg/dL — ABNORMAL HIGH (ref 70–99)
Potassium: 3.8 mmol/L (ref 3.5–5.2)
Sodium: 142 mmol/L (ref 134–144)
Total Protein: 7.3 g/dL (ref 6.0–8.5)
eGFR: 38 mL/min/{1.73_m2} — ABNORMAL LOW (ref >59)

## 2022-02-06 NOTE — Telephone Encounter (Signed)
Pt states medicaid will not covered his diabetes medication, pt do not know the name of the med. States he cannot afford to pick this up. Please call pt back.

## 2022-02-06 NOTE — Telephone Encounter (Signed)
A Prior Authorization was initiated for this patients JARDIANCE through CoverMyMeds.   Key: [id number]

## 2022-02-06 NOTE — Progress Notes (Signed)
Patient called, patient aware  Cr 2.12 from 2.09 7 months ago. However, Cr has been progressively increasing over the past year. GFR stable at 38. Discussed need to get BP under control and start Jardiance as soon as he is able. Patient had been referred to nephrology in the past, but patient has not been seen. Will send information to our referral coordinator as well.  Normal LFTS. A1c and cBG resulted during OV

## 2022-02-06 NOTE — Telephone Encounter (Signed)
Prior Auth for patients medication JARDIANCE approved by [employer] from 02/06/22 to 02/07/23.  Key: [id number]

## 2022-02-26 ENCOUNTER — Other Ambulatory Visit (HOSPITAL_COMMUNITY): Payer: Self-pay

## 2022-03-15 ENCOUNTER — Other Ambulatory Visit: Payer: Self-pay | Admitting: Student

## 2022-03-15 DIAGNOSIS — F3170 Bipolar disorder, currently in remission, most recent episode unspecified: Secondary | ICD-10-CM

## 2022-03-18 ENCOUNTER — Telehealth: Payer: Self-pay

## 2022-03-18 NOTE — Telephone Encounter (Signed)
Prior Authorization for patient (Ziprasidone) came through on cover my meds was submitted with last office notes awaiting approval or denial

## 2022-05-06 ENCOUNTER — Telehealth: Payer: Self-pay

## 2022-05-06 NOTE — Telephone Encounter (Signed)
Pt is requesting  a  call back  .[name] He stated he got a leeter with some forms that needs to be filled but he is not use if it is for  medication assist

## 2022-05-07 NOTE — Telephone Encounter (Signed)
Returned call to patient. No answer. Left message requesting call back.

## 2022-05-13 ENCOUNTER — Encounter: Payer: Medicaid Other | Admitting: Student

## 2022-05-29 ENCOUNTER — Encounter: Payer: Self-pay | Admitting: Student

## 2022-05-29 ENCOUNTER — Other Ambulatory Visit: Payer: Self-pay

## 2022-05-29 ENCOUNTER — Ambulatory Visit: Payer: Medicaid Other | Admitting: Student

## 2022-05-29 VITALS — BP 128/83 | HR 94 | Temp 98.2°F | Ht 70.0 in | Wt 205.4 lb

## 2022-05-29 DIAGNOSIS — I129 Hypertensive chronic kidney disease with stage 1 through stage 4 chronic kidney disease, or unspecified chronic kidney disease: Secondary | ICD-10-CM

## 2022-05-29 DIAGNOSIS — Z7984 Long term (current) use of oral hypoglycemic drugs: Secondary | ICD-10-CM | POA: Diagnosis not present

## 2022-05-29 DIAGNOSIS — N1831 Chronic kidney disease, stage 3a: Secondary | ICD-10-CM

## 2022-05-29 DIAGNOSIS — E119 Type 2 diabetes mellitus without complications: Secondary | ICD-10-CM

## 2022-05-29 DIAGNOSIS — E1122 Type 2 diabetes mellitus with diabetic chronic kidney disease: Secondary | ICD-10-CM

## 2022-05-29 DIAGNOSIS — I10 Essential (primary) hypertension: Secondary | ICD-10-CM

## 2022-05-29 LAB — POCT GLYCOSYLATED HEMOGLOBIN (HGB A1C): Hemoglobin A1C: 6.2 % — AB (ref 4.0–5.6)

## 2022-05-29 LAB — GLUCOSE, CAPILLARY: Glucose-Capillary: 125 mg/dL — ABNORMAL HIGH (ref 70–99)

## 2022-05-29 NOTE — Patient Instructions (Signed)
It was a pleasure seeing you in clinic today  Please continue your current medications I will call with blood results  Please follow up in 3 months

## 2022-05-30 LAB — BMP8+ANION GAP
Anion Gap: 20 mmol/L — ABNORMAL HIGH (ref 10.0–18.0)
BUN/Creatinine Ratio: 17 (ref 9–20)
BUN: 39 mg/dL — ABNORMAL HIGH (ref 6–24)
CO2: 22 mmol/L (ref 20–29)
Calcium: 9.8 mg/dL (ref 8.7–10.2)
Chloride: 103 mmol/L (ref 96–106)
Creatinine, Ser: 2.31 mg/dL — ABNORMAL HIGH (ref 0.76–1.27)
Glucose: 115 mg/dL — ABNORMAL HIGH (ref 70–99)
Potassium: 3.6 mmol/L (ref 3.5–5.2)
Sodium: 145 mmol/L — ABNORMAL HIGH (ref 134–144)
eGFR: 34 mL/min/{1.73_m2} — ABNORMAL LOW (ref >59)

## 2022-05-30 NOTE — Assessment & Plan Note (Signed)
BP well-controlled on lisinopril-hydrochlorothiazide 20-12.5 mg daily, amlodipine 10 mg daily, and Jardiance.  Will check BMP today.

## 2022-05-30 NOTE — Progress Notes (Signed)
Established Patient Office Visit  Subjective   Patient ID: Kyle Montgomery, male    DOB: Feb 15, 1975  Age: 47 y.o. MRN: [id number]  Chief Complaint  Patient presents with   Follow-up    ROUTINE OFFICE VISIT 3 MONTHS / DM / MEDICATION REFILL    Kyle Montgomery is a 47 y.o. person living with a history listed below who presents to clinic for follow up of hypertension. Please refer to problem based charting for further details and assessment and plan of current problem and chronic medical conditions.     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   Hypokalemia 10/24/2020   SIRS (systemic inflammatory response syndrome) (HCC) 10/23/2020   Septic arthritis (HCC) 10/14/2020   Left hip pain 10/13/2020   Type 2 diabetes mellitus treated without insulin (HCC) 04/15/2017   Hyperlipidemia 09/06/2014   Healthcare maintenance 12/01/2012   CKD (chronic kidney disease), stage III (HCC) 09/01/2012   Proteinuria 01/10/2012   Bipolar affective (HCC) 11/06/2005   Essential hypertension 11/06/2005   Gynecomastia 11/06/2005    ROS: negative as per HPI    Objective:     BP 128/83 (BP Location: Left Arm, Patient Position: Sitting, Cuff Size: Normal)   Pulse 94   Temp 98.2 F (36.8 C) (Oral)   Ht 5\' 10"  (1.778 m)   Wt 205 lb 6.4 oz (93.2 kg)   SpO2 100%   BMI 29.47 kg/m    Physical Exam Constitutional:      Appearance: Normal appearance.  HENT:     Mouth/Throat:     Mouth: Mucous membranes are moist.     Pharynx: Oropharynx is clear.  Cardiovascular:     Rate and Rhythm: Normal rate and regular rhythm.  Pulmonary:     Effort: Pulmonary effort is normal.     Breath sounds: No rhonchi or rales.  Abdominal:     General: Abdomen is flat. Bowel sounds are normal. There is no distension.     Palpations: Abdomen is soft.     Tenderness: There is no abdominal tenderness.  Musculoskeletal:         General: Normal range of motion.     Right lower leg: No edema.     Left lower leg: No edema.  Skin:    General: Skin is warm and dry.     Capillary Refill: Capillary refill takes less than 2 seconds.  Neurological:     General: No focal deficit present.     Mental Status: He is alert and oriented to person, place, and time.  Psychiatric:        Mood and Affect: Mood normal.        Behavior: Behavior normal.      Results for orders placed or performed in visit on 05/29/22  BMP8+Anion Gap  Result Value Ref Range   Glucose 115 (H) 70 - 99 mg/dL   BUN 39 (H) 6 - 24 mg/dL   Creatinine, Ser 2.44 (H) 0.76 - 1.27 mg/dL   eGFR 34 (L) >01 UU/VOZ/3.66   BUN/Creatinine Ratio 17 9 - 20   Sodium 145 (H) 134 - 144 mmol/L   Potassium 3.6 3.5 - 5.2 mmol/L   Chloride 103 96 - 106 mmol/L   CO2 22 20 - 29 mmol/L   Anion Gap 20.0 (H) 10.0 - 18.0 mmol/L   Calcium 9.8 8.7 - 10.2 mg/dL  Glucose, capillary  Result Value Ref  Range   Glucose-Capillary 125 (H) 70 - 99 mg/dL  POC Hbg W0J  Result Value Ref Range   Hemoglobin A1C 6.2 (A) 4.0 - 5.6 %   HbA1c POC (<> result, manual entry)     HbA1c, POC (prediabetic range)     HbA1c, POC (controlled diabetic range)      Last metabolic panel Lab Results  Component Value Date   GLUCOSE 115 (H) 05/29/2022   NA 145 (H) 05/29/2022   K 3.6 05/29/2022   CL 103 05/29/2022   CO2 22 05/29/2022   BUN 39 (H) 05/29/2022   CREATININE 2.31 (H) 05/29/2022   EGFR 34 (L) 05/29/2022   CALCIUM 9.8 05/29/2022   PHOS 4.0 10/14/2020   PROT 7.3 02/05/2022   ALBUMIN 4.3 02/05/2022   LABGLOB 3.0 02/05/2022   AGRATIO 1.4 02/05/2022   BILITOT 0.3 02/05/2022   ALKPHOS 86 02/05/2022   AST 26 02/05/2022   ALT 16 02/05/2022   ANIONGAP 13 02/24/2021      The 10-year ASCVD risk score (Arnett DK, et al., 2019) is: 13.9%    Assessment & Plan:   Problem List Items Addressed This Visit     Essential hypertension    BP well-controlled on  lisinopril-hydrochlorothiazide 20-12.5 mg daily, amlodipine 10 mg daily, and Jardiance.  Will check BMP today.      Type 2 diabetes mellitus treated without insulin (HCC) - Primary    A1c controlled at 6.2% today.  PA approved for Jardiance.  I am not sure if he is taking Rybelsus as he is unable to tell me his home medications.  Discussed with Kyle Montgomery at [facility] to see if we need to do further paperwork for his Rybelsus.  Will continue on Jardiance and Rybelsus      Relevant Orders   POC Hbg A1C (Completed)   BMP8+Anion Gap (Completed)    Return in about 3 months (around 08/29/2022).    Quincy Simmonds, MD

## 2022-05-30 NOTE — Assessment & Plan Note (Signed)
A1c controlled at 6.2% today.  PA approved for Jardiance.  I am not sure if he is taking Rybelsus as he is unable to tell me his home medications.  Discussed with [name] at [facility] to see if we need to do further paperwork for his Rybelsus.  Will continue on Jardiance and Rybelsus

## 2022-05-31 NOTE — Progress Notes (Signed)
Internal Medicine Clinic Attending  Case discussed with Dr. Liang  at the time of the visit.  We reviewed the resident's history and exam and pertinent patient test results.  I agree with the assessment, diagnosis, and plan of care documented in the resident's note.  

## 2022-06-12 ENCOUNTER — Encounter: Payer: Medicaid Other | Admitting: Student

## 2022-06-25 ENCOUNTER — Encounter: Payer: Medicaid Other | Admitting: Student

## 2022-10-19 ENCOUNTER — Other Ambulatory Visit: Payer: Self-pay | Admitting: Student

## 2022-10-19 DIAGNOSIS — F3170 Bipolar disorder, currently in remission, most recent episode unspecified: Secondary | ICD-10-CM

## 2022-10-21 ENCOUNTER — Other Ambulatory Visit: Payer: Self-pay

## 2022-10-21 DIAGNOSIS — I10 Essential (primary) hypertension: Secondary | ICD-10-CM

## 2022-10-21 MED ORDER — LISINOPRIL-HYDROCHLOROTHIAZIDE 20-12.5 MG PO TABS: 2.0000 | ORAL_TABLET | Freq: Every day | ORAL | 3 refills | Status: DC

## 2022-10-21 NOTE — Telephone Encounter (Signed)
Will refill. At follow-up should check lipids and A1c, screen for extrapyramidal symptoms. Consider EKG for Qtc monitoring.

## 2022-10-21 NOTE — Telephone Encounter (Signed)
Repeat BMP at follow up.

## 2022-12-10 ENCOUNTER — Other Ambulatory Visit: Payer: Self-pay

## 2022-12-10 ENCOUNTER — Emergency Department (HOSPITAL_COMMUNITY)
Admission: EM | Admit: 2022-12-10 | Discharge: 2022-12-10 | Discharge status: Against Medical Advice | Payer: Medicaid Other | Attending: Emergency Medicine | Admitting: Emergency Medicine

## 2022-12-10 ENCOUNTER — Emergency Department (HOSPITAL_COMMUNITY): Payer: Medicaid Other

## 2022-12-10 ENCOUNTER — Encounter (HOSPITAL_COMMUNITY): Payer: Self-pay

## 2022-12-10 DIAGNOSIS — M7989 Other specified soft tissue disorders: Secondary | ICD-10-CM | POA: Diagnosis not present

## 2022-12-10 DIAGNOSIS — Z5321 Procedure and treatment not carried out due to patient leaving prior to being seen by health care provider: Secondary | ICD-10-CM | POA: Diagnosis not present

## 2022-12-10 DIAGNOSIS — R2231 Localized swelling, mass and lump, right upper limb: Secondary | ICD-10-CM | POA: Insufficient documentation

## 2022-12-10 DIAGNOSIS — M79641 Pain in right hand: Secondary | ICD-10-CM | POA: Diagnosis not present

## 2022-12-10 NOTE — ED Triage Notes (Signed)
Pt states he was cracking knuckles of R had, now has swelling to R hand and fingers; denies pain

## 2022-12-10 NOTE — ED Notes (Signed)
Pt called x3 for vs check. Pt eloped from the ED lobby.

## 2023-01-28 ENCOUNTER — Encounter: Payer: Medicaid Other | Admitting: Student

## 2023-02-16 ENCOUNTER — Other Ambulatory Visit: Payer: Self-pay | Admitting: Student

## 2023-02-16 DIAGNOSIS — I10 Essential (primary) hypertension: Secondary | ICD-10-CM

## 2023-02-17 NOTE — Telephone Encounter (Signed)
Medication sent to pharmacy

## 2023-03-05 ENCOUNTER — Ambulatory Visit (INDEPENDENT_AMBULATORY_CARE_PROVIDER_SITE_OTHER): Payer: Medicaid Other | Admitting: Student

## 2023-03-05 VITALS — BP 148/101 | HR 80 | Ht 70.0 in | Wt 210.9 lb

## 2023-03-05 DIAGNOSIS — E119 Type 2 diabetes mellitus without complications: Secondary | ICD-10-CM | POA: Diagnosis not present

## 2023-03-05 DIAGNOSIS — I10 Essential (primary) hypertension: Secondary | ICD-10-CM

## 2023-03-05 LAB — GLUCOSE, CAPILLARY: Glucose-Capillary: 109 mg/dL — ABNORMAL HIGH (ref 70–99)

## 2023-03-05 LAB — POCT GLYCOSYLATED HEMOGLOBIN (HGB A1C): Hemoglobin A1C: 6.1 % — AB (ref 4.0–5.6)

## 2023-03-05 NOTE — Progress Notes (Signed)
Retinal images were done today as part of this patient's yearly diabetes health maintenance program. They were transmitted electronically to an ophthalmologist who will interpret them and send a report back with their findings. This was explained to the patient and they were also informed that the results would be communicated to them either by phone,  mail or both.   Norm Parcel, RD 03/05/2023 9:32 AM.

## 2023-03-05 NOTE — Progress Notes (Signed)
   CC: Follow-up on diabetes, and hypertension.  HPI:  [name] is a 48 y.o. male living with a history stated below and presents today for follow-up on diabetes and hypertension.. Please see problem based assessment and plan for additional details.  Past Medical History:  Diagnosis Date   Bipolar affective (HCC)    followed by mental health   Gynecomastia 02/07   normal pituitary by MRI   Hyperprolactinemia (HCC) 09/27/04   felt 2/2 lithium and fluphenazine   Hypertension    Renal insufficiency    baseline Cr 2.2 - renal US with increased Echo signal, no nephrology eval, no bx    Current Outpatient Medications on File Prior to Visit  Medication Sig Dispense Refill   acetaminophen (TYLENOL) 325 MG tablet Take 2 tablets (650 mg total) by mouth every 6 (six) hours as needed. (Patient taking differently: Take 650 mg by mouth every 6 (six) hours as needed for mild pain, fever or headache.) 15 tablet 0   amLODipine (NORVASC) 10 MG tablet TAKE 1 TABLET BY MOUTH EVERY DAY 90 tablet 3   atorvastatin (LIPITOR) 40 MG tablet Take 1 tablet (40 mg total) by mouth daily. 90 tablet 3   divalproex (DEPAKOTE ER) 500 MG 24 hr tablet Take 1 tablet (500 mg total) by mouth at bedtime. 90 tablet 3   lisinopril-hydrochlorothiazide (ZESTORETIC) 20-12.5 MG tablet Take 2 tablets by mouth daily. 180 tablet 3   ziprasidone (GEODON) 80 MG capsule TAKE 1 CAPSULE (80 MG TOTAL) BY MOUTH 2 (TWO) TIMES DAILY. 90 capsule 3   No current facility-administered medications on file prior to visit.      Review of Systems: ROS negative except for what is noted on the assessment and plan.  Vitals:   03/05/23 0911 03/05/23 0922 03/05/23 0923  BP: (!) 148/103 (!) 148/104 (!) 148/101  Pulse:   80  TempSrc: Oral    Weight: 210 lb 14.4 oz (95.7 kg)    Height: 5\' 10"  (1.778 m)      Physical Exam: Constitutional: NAD Cardiovascular: regular rate and rhythm, no m/r/g Pulmonary/Chest: normal work of breathing on  room air, lungs clear to auscultation bilaterally Abdominal: soft, non-tender, non-distended  Assessment & Plan:   No problem-specific Assessment & Plan notes found for this encounter.  Hypertension BP not at goal.  Both initial and repeat blood pressure  elevated 148/101.  Home regimen includes lisinopril -HCTZ 20-12.5mg   and amlodipine 10 mg.  At last office visit, there was a tentative plan to start Jardiance, but I confirmed that medicine was not sent.  Patient was unclear about which medicines he is taking, so I scheduled the 4-week follow-up, and encouraged him to bring all his medicines.  At next office visit, if blood pressure still elevated, consider adding either beta-blocker  vs GLP-1 vs. SGLT-2.   - Will get a BMP today. - Cw  lisinopril -HCTZ 20-12.5 mg - Cw  amlodipine 10 mg    Type 2 diabetes Diet controlled diabetes, A1c today 6.1.  He is currently not taking any medicine for diabetes.  Will follow-up on his retina images report, was done by Ms. Lupita Leash, she will send it to be interpreted by ophthalmologist.   - UACR - BMP as above. - Follow up on ophthalmology report.  Patient discussed with Dr. Kizzie Ide, MD [facility] Internal Medicine, PGY-1 Pager: [phone] Date 03/05/2023 Time 11:10 AM

## 2023-03-05 NOTE — Patient Instructions (Addendum)
Thank you, Kyle Montgomery for allowing [place] to provide your care today. Today we discussed   Your blood pressure still elevated.  Please bring all your home meds at your next appointment.  2.  Your diabetes is diet controlled.  Will consider other medicines that can help your kidney disease, and also weight loss.  3.  I will get some labs today, will call you with the results.  I have ordered the following labs for you:   Lab Orders         Glucose, capillary         Microalbumin / Creatinine Urine Ratio         Basic metabolic panel         POC Hbg A1C      Tests ordered today:   Referrals ordered today:   Referral Orders  No referral(s) requested today     I have ordered the following medication/changed the following medications:   Stop the following medications: There are no discontinued medications.   Start the following medications: No orders of the defined types were placed in this encounter.    Follow up:  4 WEEKS      Remember:   Should you have any questions or concerns please call the internal medicine clinic at [phone].     Laretta Bolster, MD  [facility]         You had photos taken of your retinas (part of your eyes).    These photos were sent to eye doctors in [place] to see if diabetes is affecting the back of your eye.    If the doctor in [place] tells [place] that you should see an eye doctor, we will call you.   If the doctor in [place] says to return in 1 year. You will get a copy of the report in the mail.    Thank you for allowing [place] to help with your diabetes and vision care today!   Sincerely,  Norm Parcel [phone]

## 2023-03-06 LAB — MICROALBUMIN / CREATININE URINE RATIO
Creatinine, Urine: 70 mg/dL
Microalb/Creat Ratio: 1025 mg/g{creat} — ABNORMAL HIGH (ref 0–29)
Microalbumin, Urine: 717.7 ug/mL

## 2023-03-06 LAB — BASIC METABOLIC PANEL
BUN/Creatinine Ratio: 12 (ref 9–20)
BUN: 27 mg/dL — ABNORMAL HIGH (ref 6–24)
CO2: 23 mmol/L (ref 20–29)
Calcium: 9.5 mg/dL (ref 8.7–10.2)
Chloride: 103 mmol/L (ref 96–106)
Creatinine, Ser: 2.33 mg/dL — ABNORMAL HIGH (ref 0.76–1.27)
Glucose: 98 mg/dL (ref 70–99)
Potassium: 3.5 mmol/L (ref 3.5–5.2)
Sodium: 142 mmol/L (ref 134–144)
eGFR: 34 mL/min/{1.73_m2} — ABNORMAL LOW (ref >59)

## 2023-03-06 NOTE — Progress Notes (Signed)
Internal Medicine Clinic Attending  I was physically present during the key portions of the resident provided service and participated in the medical decision making of patient's management care. I reviewed pertinent patient test results.  The assessment, diagnosis, and plan were formulated together and I agree with the documentation in the resident's note.  As a correction to the resident note; he is prescribed lisinopril-hydrochlorothiazide 20-12.5; 2 tablets daily (40-25 mg daily).   Reymundo Poll, MD

## 2023-03-06 NOTE — Progress Notes (Signed)
Stable BMP, Scr 2.33, GFR 34, consistent with hx of CKD 3b. Pt notified.

## 2023-03-06 NOTE — Addendum Note (Signed)
Addended by: Burnell Blanks on: 03/06/2023 02:48 PM   Modules accepted: Level of Service

## 2023-03-07 NOTE — Progress Notes (Signed)
UACR mildly improved, but still severely increased.  Could consider adding SGLT2 if blood pressure can tolerate.  No history of UTIs.

## 2023-03-10 ENCOUNTER — Other Ambulatory Visit: Payer: Self-pay | Admitting: Student

## 2023-03-10 DIAGNOSIS — F3170 Bipolar disorder, currently in remission, most recent episode unspecified: Secondary | ICD-10-CM

## 2023-04-02 ENCOUNTER — Encounter: Payer: Medicaid Other | Admitting: Student

## 2023-04-03 ENCOUNTER — Encounter: Admitting: Student

## 2023-04-17 ENCOUNTER — Other Ambulatory Visit: Payer: Self-pay | Admitting: Student

## 2023-04-17 DIAGNOSIS — E7849 Other hyperlipidemia: Secondary | ICD-10-CM

## 2023-04-25 ENCOUNTER — Other Ambulatory Visit: Payer: Self-pay

## 2023-05-01 NOTE — Progress Notes (Signed)
Retinal results were abstracted into patient's chart. See Ophthalmology section of Health Maintenance section of his chart for details

## 2023-05-02 ENCOUNTER — Other Ambulatory Visit: Payer: Self-pay | Admitting: Student

## 2023-05-02 DIAGNOSIS — F3170 Bipolar disorder, currently in remission, most recent episode unspecified: Secondary | ICD-10-CM

## 2023-05-05 ENCOUNTER — Telehealth: Payer: Self-pay

## 2023-05-05 NOTE — Telephone Encounter (Signed)
 Medication sent to pharmacy

## 2023-05-05 NOTE — Telephone Encounter (Signed)
 Kyle Montgomery (Kyle Montgomery) PA Case ID #: [id number] Rx #: [id number] Need Help? Call us  at [phone] Outcome Approved today by [employer] 2017 Approved. ZIPRASIDONE HCL 80MG  Capsule is approved from 05/05/2023 to 05/04/2024. Effective Date: 05/05/2023 Authorization Expiration Date: 05/04/2024 Drug Ziprasidone HCl 80MG  capsules ePA cloud logo Form [employer] Electronic Prior Authorization Form Original Claim Info 75 Possible Safety Risk of Drug-Disease. Safety documentation required prescribershould submit safety documentation through provider portal. Provider may call provider services with safety documentation

## 2023-05-05 NOTE — Telephone Encounter (Signed)
 Prior Authorization for patient (Ziprasidone HCl 80MG  capsules) came through on cover my meds was submitted with last office notes awaiting approval or denial.  KEY:[id number]

## 2023-05-05 NOTE — Telephone Encounter (Unsigned)
 Copied from CRM [id number]. Topic: General - Other >> May 05, 2023  7:48 AM Carrielelia G wrote: Reason for CRM: Patient called and stated he was coming in person (no appt) to talk with you all regarding a prescription. Pleased advise.

## 2023-05-05 NOTE — Telephone Encounter (Signed)
 Patient walked in today asking about medication refill.  He is completely out and would like it call into the pharmacy today.  Also requesting six month supply.  Forwarding message to triage nurse.

## 2023-08-04 ENCOUNTER — Ambulatory Visit: Payer: Self-pay | Admitting: Student

## 2023-08-04 ENCOUNTER — Encounter: Payer: Self-pay | Admitting: Student

## 2023-08-04 ENCOUNTER — Other Ambulatory Visit: Payer: Self-pay

## 2023-08-04 VITALS — BP 128/81 | HR 90 | Temp 97.7°F | Ht 70.0 in | Wt 208.8 lb

## 2023-08-04 DIAGNOSIS — E1122 Type 2 diabetes mellitus with diabetic chronic kidney disease: Secondary | ICD-10-CM

## 2023-08-04 DIAGNOSIS — N1832 Chronic kidney disease, stage 3b: Secondary | ICD-10-CM

## 2023-08-04 DIAGNOSIS — I10 Essential (primary) hypertension: Secondary | ICD-10-CM

## 2023-08-04 DIAGNOSIS — Z9229 Personal history of other drug therapy: Secondary | ICD-10-CM

## 2023-08-04 DIAGNOSIS — Z23 Encounter for immunization: Secondary | ICD-10-CM

## 2023-08-04 DIAGNOSIS — Z7984 Long term (current) use of oral hypoglycemic drugs: Secondary | ICD-10-CM | POA: Diagnosis not present

## 2023-08-04 DIAGNOSIS — N183 Chronic kidney disease, stage 3 unspecified: Secondary | ICD-10-CM

## 2023-08-04 DIAGNOSIS — E119 Type 2 diabetes mellitus without complications: Secondary | ICD-10-CM

## 2023-08-04 DIAGNOSIS — I129 Hypertensive chronic kidney disease with stage 1 through stage 4 chronic kidney disease, or unspecified chronic kidney disease: Secondary | ICD-10-CM | POA: Diagnosis not present

## 2023-08-04 LAB — POCT GLYCOSYLATED HEMOGLOBIN (HGB A1C): Hemoglobin A1C: 6.5 % — AB (ref 4.0–5.6)

## 2023-08-04 LAB — GLUCOSE, CAPILLARY: Glucose-Capillary: 174 mg/dL — ABNORMAL HIGH (ref 70–99)

## 2023-08-04 MED ORDER — EMPAGLIFLOZIN 10 MG PO TABS
10.0000 mg | ORAL_TABLET | Freq: Every day | ORAL | 3 refills | Status: DC
  Filled 2023-09-02 – 2023-12-02 (×3): qty 90, 90d supply, fill #0

## 2023-08-04 NOTE — Assessment & Plan Note (Signed)
 A1c increased from 6.1 to 6.5 today. Not currently on any anti-hyperglycemics. He does have severe microalbuminuria, with last one being >1000. He's already on an ACE with his lisinopril , and it looks like he has been trying to get on Jardiance  however they may have been some insurance restraints. Will represcribe Jardiance  for him as I think it would really benefit him from a hyperglycemia perspective as well as his microalbuminuria.   Plan:  - Start Jardiance  10mg 

## 2023-08-04 NOTE — Assessment & Plan Note (Signed)
 Well controlled today at 128/81, will continue his amlodipine  10mg  and Zestoretic  20-12.5mg .

## 2023-08-04 NOTE — Assessment & Plan Note (Signed)
 Does not follow with nephrology currently, BMP's have been sporadic, but baseline appears to be now around 2.3 creatinine and GFR of 34. Will obtain a BMP today, but with already kidney disease and significant proteinuria, will place a referral.   Plan:  - Check BMP  - Nephrology referral

## 2023-08-04 NOTE — Patient Instructions (Signed)
 Thank you so much for coming to the clinic today!   I have sent in the Jardiance , please let me know if you have any troubles  Please continue to work on your diet and exercise  If you have any questions please feel free to the call the clinic at anytime at [phone]. It was a pleasure seeing you!  Best, Dr. Chelsia Serres

## 2023-08-04 NOTE — Progress Notes (Signed)
 CC: T2DM and HTN follow up  HPI:  [name] is a 48 y.o. male living with a history stated below and presents today for T2DM and HTN follow up. Please see problem based assessment and plan for additional details.  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 (Patient taking differently: Take 650 mg by mouth every 6 (six) hours as needed for mild pain, fever or headache.) 15 tablet 0   amLODipine  (NORVASC ) 10 MG tablet TAKE 1 TABLET BY MOUTH EVERY DAY 90 tablet 3   atorvastatin  (LIPITOR) 40 MG tablet TAKE 1 TABLET BY MOUTH EVERY DAY 90 tablet 3   divalproex  (DEPAKOTE  ER) 500 MG 24 hr tablet TAKE 1 TABLET BY MOUTH EVERYDAY AT BEDTIME 90 tablet 0   lisinopril -hydrochlorothiazide  (ZESTORETIC ) 20-12.5 MG tablet Take 2 tablets by mouth daily. 180 tablet 3   ziprasidone  (GEODON ) 80 MG capsule TAKE 1 CAPSULE (80 MG TOTAL) BY MOUTH 2 (TWO) TIMES DAILY. 90 capsule 3   No current facility-administered medications on file prior to visit.    Family History  Problem Relation Age of Onset   Hyperlipidemia Mother    Hypertension Mother    Atrial fibrillation Mother    Hypertension Brother    Hypertension Maternal Grandmother    Cancer Maternal Grandmother     Social History   Socioeconomic History   Marital status: Single    Spouse name: Not on file   Number of children: Not on file   Years of education: Not on file   Highest education level: Not on file  Occupational History   Not on file   Tobacco Use   Smoking status: Former    Current packs/day: 0.00    Types: Cigarettes    Quit date: 01/22/1983    Years since quitting: 40.5   Smokeless tobacco: Never  Vaping Use   Vaping status: Never Used  Substance and Sexual Activity   Alcohol use: No    Alcohol/week: 0.0 standard drinks of alcohol   Drug use: No   Sexual activity: Not on file  Other Topics Concern   Not on file  Social History Narrative   Not on file   Social Drivers of Health   Financial Resource Strain: Not on file  Food Insecurity: No Food Insecurity (08/04/2023)   Hunger Vital Sign    Worried About Running Out of Food in the Last Year: Never true    Ran Out of Food in the Last Year: Never true  Transportation Needs: No Transportation Needs (08/04/2023)   PRAPARE - Administrator, Civil Service (Medical): No    Lack of Transportation (Non-Medical): No  Physical Activity: Insufficiently Active (08/04/2023)   Exercise Vital Sign    Days of Exercise per Week: 7 days    Minutes of Exercise per Session: 10 min  Stress: Not on file  Social Connections: Moderately Isolated (08/04/2023)   Social Connection and Isolation Panel  Frequency of Communication with Friends and Family: More than three times a week    Frequency of Social Gatherings with Friends and Family: More than three times a week    Attends Religious Services: More than 4 times per year    Active Member of [employer] or Organizations: No    Attends Banker Meetings: Never    Marital Status: Never married  Intimate Partner Violence: Not At Risk (08/04/2023)   Humiliation, Afraid, Rape, and Kick questionnaire    Fear of Current or Ex-Partner: No    Emotionally Abused: No    Physically Abused: No    Sexually Abused: No    Review of Systems: ROS negative except for what is noted on the assessment and plan.  Vitals:   08/04/23 0938  BP: 128/81  Pulse: 90  Temp: 97.7 F (36.5 C)  TempSrc: Oral  SpO2: 100%  Weight:  208 lb 12.8 oz (94.7 kg)  Height: 5' 10 (1.778 m)    Physical Exam: Constitutional: well-appearing male  in no acute distress Cardiovascular: regular rate and rhythm, no m/r/g Pulmonary/Chest: normal work of breathing on room air, lungs clear to auscultation bilaterally Abdominal: soft, non-tender, non-distended   Assessment & Plan:   Type 2 diabetes mellitus treated without insulin  (HCC) A1c increased from 6.1 to 6.5 today. Not currently on any anti-hyperglycemics. He does have severe microalbuminuria, with last one being >1000. He's already on an ACE with his lisinopril , and it looks like he has been trying to get on Jardiance  however they may have been some insurance restraints. Will represcribe Jardiance  for him as I think it would really benefit him from a hyperglycemia perspective as well as his microalbuminuria.   Plan:  - Start Jardiance  10mg    CKD (chronic kidney disease), stage III (HCC) Does not follow with nephrology currently, BMP's have been sporadic, but baseline appears to be now around 2.3 creatinine and GFR of 34. Will obtain a BMP today, but with already kidney disease and significant proteinuria, will place a referral.   Plan:  - Check BMP  - Nephrology referral  Essential hypertension Well controlled today at 128/81, will continue his amlodipine  10mg  and Zestoretic  20-12.5mg .   Patient discussed with Dr. Karna Dirks Caili Escalera, M.D. [facility], PGY-3 Pager: [phone] Date 08/04/2023 Time 2:07 PM

## 2023-08-05 LAB — BASIC METABOLIC PANEL WITH GFR
BUN/Creatinine Ratio: 13 (ref 9–20)
BUN: 31 mg/dL — ABNORMAL HIGH (ref 6–24)
CO2: 21 mmol/L (ref 20–29)
Calcium: 9.4 mg/dL (ref 8.7–10.2)
Chloride: 103 mmol/L (ref 96–106)
Creatinine, Ser: 2.31 mg/dL — ABNORMAL HIGH (ref 0.76–1.27)
Glucose: 107 mg/dL — ABNORMAL HIGH (ref 70–99)
Potassium: 3.6 mmol/L (ref 3.5–5.2)
Sodium: 143 mmol/L (ref 134–144)
eGFR: 34 mL/min/1.73 — ABNORMAL LOW (ref >59)

## 2023-08-06 ENCOUNTER — Ambulatory Visit: Payer: Self-pay | Admitting: Student

## 2023-08-06 NOTE — Progress Notes (Signed)
 Internal Medicine Clinic Attending  Case discussed with the resident at the time of the visit.  We reviewed the resident's history and exam and pertinent patient test results.  I agree with the assessment, diagnosis, and plan of care documented in the resident's note.

## 2023-08-18 ENCOUNTER — Ambulatory Visit: Payer: Self-pay | Admitting: Student

## 2023-09-01 ENCOUNTER — Ambulatory Visit: Payer: Self-pay

## 2023-09-01 NOTE — Telephone Encounter (Signed)
 Call to patient is currently taking Tylenol  Arthritis for his hip pain .  Little relief.  States he was told when he was in the hospital years ago to call his doctor if the pain flares up.

## 2023-09-01 NOTE — Telephone Encounter (Signed)
 FYI Only or Action Required?: Action required by provider: request for appointment.  Patient was last seen in primary care on 08/04/2023 by Nooruddin, Saad, MD.  Called Nurse Triage reporting Hip Pain.  Symptoms began several days ago.  Interventions attempted: tylenol .  Symptoms are: unchanged.  Triage Disposition: See PCP When Office is Open (Within 3 Days)  Patient/caregiver understands and will follow disposition?: YesCopied from CRM #[id number]. Topic: Clinical - Red Word Triage >> Sep 01, 2023 12:00 PM Alfonso ORN wrote: Red Word that prompted transfer to Nurse Triage: patient had a fall and broke pelvis  some years ago and having  flare up on left hip when walk , have pain  rate 8 , patient need pain medication   pt. [phone] Reason for Disposition  [1] MODERATE pain (e.g., interferes with normal activities, limping) AND [2] present > 3 days  Answer Assessment - Initial Assessment Questions Pt broke left pelvis years ago from falling down stairs. Pt is having a flare up with pain. Pt has tried OTC meds for last several days and pain not improving.     1. LOCATION and RADIATION: Where is the pain located? Does the pain spread (shoot) anywhere else?     Left hip area 2. QUALITY: What does the pain feel like?  (e.g., sharp, dull, aching, burning)     Sharp/pull muscle  3. SEVERITY: How bad is the pain? What does it keep you from doing?   (Scale 1-10; or mild, moderate, severe)     8 4. ONSET: When did the pain start? Does it come and go, or is it there all the time?     3 days ago 5. WORK OR EXERCISE: Has there been any recent work or exercise that involved this part of the body?      na 6. CAUSE: What do you think is causing the hip pain?      Injury from  years ago 7. AGGRAVATING FACTORS: What makes the hip pain worse? (e.g., walking, climbing stairs, running)     movement 8. OTHER SYMPTOMS: Do you have any other symptoms? (e.g., back pain, pain  shooting down leg,  fever, rash)     denies  Protocols used: Hip Pain-A-AH

## 2023-09-02 ENCOUNTER — Other Ambulatory Visit: Payer: Self-pay

## 2023-09-02 ENCOUNTER — Ambulatory Visit (INDEPENDENT_AMBULATORY_CARE_PROVIDER_SITE_OTHER): Payer: Self-pay | Admitting: Student

## 2023-09-02 ENCOUNTER — Other Ambulatory Visit (HOSPITAL_COMMUNITY): Payer: Self-pay

## 2023-09-02 VITALS — BP 132/86 | HR 89 | Temp 98.0°F | Ht 70.0 in | Wt 208.4 lb

## 2023-09-02 DIAGNOSIS — F3170 Bipolar disorder, currently in remission, most recent episode unspecified: Secondary | ICD-10-CM | POA: Diagnosis not present

## 2023-09-02 DIAGNOSIS — E663 Overweight: Secondary | ICD-10-CM | POA: Diagnosis not present

## 2023-09-02 DIAGNOSIS — M25552 Pain in left hip: Secondary | ICD-10-CM | POA: Diagnosis not present

## 2023-09-02 DIAGNOSIS — Z6829 Body mass index (BMI) 29.0-29.9, adult: Secondary | ICD-10-CM | POA: Diagnosis not present

## 2023-09-02 MED ORDER — ACETAMINOPHEN ER 650 MG PO TBCR: 650.0000 mg | EXTENDED_RELEASE_TABLET | Freq: Four times a day (QID) | ORAL | Status: DC

## 2023-09-02 MED ORDER — IBUPROFEN 200 MG PO TABS
400.0000 mg | ORAL_TABLET | Freq: Four times a day (QID) | ORAL | 0 refills | Status: DC | PRN
  Filled 2023-09-02: qty 30, 4d supply, fill #0

## 2023-09-02 MED ORDER — DICLOFENAC SODIUM 1 % EX GEL
2.0000 g | Freq: Four times a day (QID) | CUTANEOUS | 0 refills | Status: DC
  Filled 2023-09-02: qty 100, 30d supply, fill #0
  Filled 2023-09-02: qty 100, 25d supply, fill #0

## 2023-09-02 MED ORDER — DIVALPROEX SODIUM ER 500 MG PO TB24
500.0000 mg | ORAL_TABLET | Freq: Every day | ORAL | 0 refills | Status: DC
  Filled 2023-09-02 (×2): qty 90, 90d supply, fill #0

## 2023-09-02 MED FILL — Atorvastatin Calcium Tab 40 MG (Base Equivalent): ORAL | 90 days supply | Qty: 90 | Fill #0 | Status: CN

## 2023-09-02 MED FILL — Amlodipine Besylate Tab 10 MG (Base Equivalent): ORAL | 90 days supply | Qty: 90 | Fill #0 | Status: CN

## 2023-09-02 MED FILL — Amlodipine Besylate Tab 10 MG (Base Equivalent): ORAL | 90 days supply | Qty: 90 | Fill #0 | Status: AC

## 2023-09-02 NOTE — Patient Instructions (Signed)
 Return in about 3 months (around 12/03/2023) for preventive medicine exam.  Remember to bring all of the medications that you take (including over the counter medications and supplements) with you to every clinic visit.  This after visit summary is an important review of tests, referrals, and medication changes that were discussed during your visit. If you have questions or concerns, call [phone]. Outside of clinic business hours, call the main hospital at [phone] and ask the operator for the on-call internal medicine resident.   Ozell Kung MD 09/02/2023, 2:42 PM

## 2023-09-02 NOTE — Assessment & Plan Note (Signed)
 Acute, improving.  No systemic symptoms or signs.  History suggests a muscle strain.  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  Deferring imaging as I do not think this will change management.  Acetaminophen  650 mg every 6 hours, diclofenac  gel 4 times daily.  Return to clinic if pain does not continue to improve or is still functional limiting after 2 weeks.

## 2023-09-02 NOTE — Assessment & Plan Note (Addendum)
 Body mass index is 29.9 kg/m.  I recommend eliminating sugary beverages like soda, sweet tea, and juice entirely. I recommended more vegetables, lean protein, and legumes. Frozen vegetables are healthy and inexpensive. Beans are a healthy and inexpensive source of lean protein and fiber. I recommend gradually increasing exercise. A daily walk is a great way to start an exercise program.

## 2023-09-02 NOTE — Progress Notes (Signed)
 Patient name: CASYN BECVAR Date of birth: [birth year] Date of visit: 09/02/23  Subjective   Chief concern: left hip pain  3-4 days of left hip pain (maybe a little longer).  He thinks it is a muscle strain.  Started after he helped mom take some furniture in the house. It's not getting worse, but still painful despite taking extra strength Tylenol  arthritis.  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  Review of Systems  Constitutional:  Negative for chills, diaphoresis and fever.  Musculoskeletal:        No redness or swelling over hip    Current Outpatient Medications  Medication Instructions   acetaminophen  (QC ACETAMINOPHEN  8HR ARTH PAIN) 650 mg, Oral, Every 6 hours   amLODipine  (NORVASC ) 10 MG tablet TAKE 1 TABLET BY MOUTH EVERY DAY   atorvastatin  (LIPITOR) 40 MG tablet TAKE 1 TABLET BY MOUTH EVERY DAY   diclofenac  Sodium (VOLTAREN ) 2 g, Topical, 4 times daily   divalproex  (DEPAKOTE  ER) 500 mg, Oral, Daily at bedtime   empagliflozin  (JARDIANCE ) 10 mg, Oral, Daily before breakfast   lisinopril -hydrochlorothiazide  (ZESTORETIC ) 20-12.5 MG tablet 2 tablets, Oral, Daily   ziprasidone  (GEODON ) 80 mg, Oral, 2 times daily     Objective  Today's Vitals   09/02/23 1356  BP: 132/86  Pulse: 89  Temp: 98 F (36.7 C)  TempSrc: Oral  SpO2: 99%  Weight: 208 lb 6.4 oz (94.5 kg)  Height: 5' 10 (1.778 m)  PainSc: 8   PainLoc: Hip  Body mass index is 29.9 kg/m.   Physical Exam Constitutional:      Appearance: Normal appearance.  Cardiovascular:     Rate and Rhythm: Normal rate and regular rhythm.     Comments: Bilateral femoral pulses normal without bruits.  Bilateral DP pulses intact. Pulmonary:     Effort: Pulmonary effort is normal. No respiratory distress.  Musculoskeletal:     Comments: No warmth, erythema, swelling over the left hip joint.  He has some tenderness around the left hip  joint and posteriorly towards the gluteal muscles.  Skin:    General: Skin is warm and dry.  Neurological:     Mental Status: He is alert.     Cranial Nerves: No facial asymmetry.  Psychiatric:        Mood and Affect: Affect normal.        Speech: Speech normal.        Behavior: Behavior normal.      Assessment & Plan   Left hip pain Assessment & Plan: Acute, improving.  No systemic symptoms or signs.  History suggests a muscle strain.  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  Deferring imaging as I do not think this will change management.  Acetaminophen  650 mg every 6 hours, diclofenac  gel 4 times daily.  Return to clinic if pain does not continue to improve or is still functional limiting after 2 weeks.  Orders: -     Acetaminophen  ER; Take 1 tablet (650 mg total) by mouth every 6 (six) hours. -     CBC with Differential/Platelet -     C-reactive protein -     Diclofenac  Sodium; Apply 2 g topically 4 (four) times daily.  Dispense: 100 g; Refill: 0  Bipolar disorder in full remission, most recent episode unspecified type (HCC) -     Divalproex  Sodium ER; Take 1 tablet (500 mg  total) by mouth at bedtime.  Dispense: 90 tablet; Refill: 0  Overweight (BMI 25.0-29.9) Assessment & Plan: Body mass index is 29.9 kg/m.  I recommend eliminating sugary beverages like soda, sweet tea, and juice entirely. I recommended more vegetables, lean protein, and legumes. Frozen vegetables are healthy and inexpensive. Beans are a healthy and inexpensive source of lean protein and fiber. I recommend gradually increasing exercise. A daily walk is a great way to start an exercise program.      Return in about 3 months (around 12/03/2023) for preventive medicine exam.  Ozell Kung MD 09/02/2023, 4:59 PM

## 2023-09-03 ENCOUNTER — Other Ambulatory Visit (HOSPITAL_BASED_OUTPATIENT_CLINIC_OR_DEPARTMENT_OTHER): Payer: Self-pay

## 2023-09-03 NOTE — Progress Notes (Signed)
 Internal Medicine Clinic Attending  Case discussed with the resident at the time of the visit.  We reviewed the resident's history and exam and pertinent patient test results.  I agree with the assessment, diagnosis, and plan of care documented in the resident's note.

## 2023-09-04 ENCOUNTER — Ambulatory Visit: Payer: Self-pay | Admitting: Student

## 2023-09-04 LAB — CBC WITH DIFFERENTIAL/PLATELET
Basophils Absolute: 0.1 x10E3/uL (ref 0.0–0.2)
Basos: 1 %
EOS (ABSOLUTE): 0.3 x10E3/uL (ref 0.0–0.4)
Eos: 4 %
Hematocrit: 39.4 % (ref 37.5–51.0)
Hemoglobin: 12.9 g/dL — ABNORMAL LOW (ref 13.0–17.7)
Immature Grans (Abs): 0 x10E3/uL (ref 0.0–0.1)
Immature Granulocytes: 0 %
Lymphocytes Absolute: 2.7 x10E3/uL (ref 0.7–3.1)
Lymphs: 31 %
MCH: 28.5 pg (ref 26.6–33.0)
MCHC: 32.7 g/dL (ref 31.5–35.7)
MCV: 87 fL (ref 79–97)
Monocytes Absolute: 0.7 x10E3/uL (ref 0.1–0.9)
Monocytes: 8 %
Neutrophils Absolute: 5 x10E3/uL (ref 1.4–7.0)
Neutrophils: 56 %
Platelets: 291 x10E3/uL (ref 150–450)
RBC: 4.53 x10E6/uL (ref 4.14–5.80)
RDW: 14.4 % (ref 11.6–15.4)
WBC: 8.8 x10E3/uL (ref 3.4–10.8)

## 2023-09-04 LAB — C-REACTIVE PROTEIN: CRP: 21 mg/L — ABNORMAL HIGH (ref 0–10)

## 2023-09-08 ENCOUNTER — Other Ambulatory Visit: Payer: Self-pay

## 2023-09-09 ENCOUNTER — Other Ambulatory Visit: Payer: Self-pay

## 2023-09-15 ENCOUNTER — Other Ambulatory Visit: Payer: Self-pay

## 2023-09-15 ENCOUNTER — Telehealth: Payer: Self-pay | Admitting: *Deleted

## 2023-09-15 DIAGNOSIS — F3170 Bipolar disorder, currently in remission, most recent episode unspecified: Secondary | ICD-10-CM

## 2023-09-15 NOTE — Telephone Encounter (Signed)
 Copied from CRM #[id number]. Topic: Clinical - Prescription Issue >> Sep 15, 2023  9:50 AM Kyle Montgomery ORN wrote: Reason for CRM: Patient is requesting for a nurse to give him a call. He states he called the pharmacy to get a refill on his ziprasidone  (GEODON ) 80 MG capsule but they told him that they couldn't refill it due to his profile. They told him that he would need to speak with a nurse from the clinic. Please give patient a call back. CB #: [phone].

## 2023-09-15 NOTE — Telephone Encounter (Signed)
 I called / informed pt the doctors are given a 48 hrs time pd for refills.

## 2023-09-15 NOTE — Telephone Encounter (Signed)
 I called [facility] who stated rx was transferred to [facility] in August. They stated it was transferred tfrom [facility] so they need a new rx.

## 2023-09-15 NOTE — Telephone Encounter (Signed)
 Copied from CRM [id number]. Topic: Clinical - Prescription Issue >> Sep 15, 2023 11:08 AM Diannia H wrote: Reason for CRM: Patient called back and wanted to know how long it takes for his prescription to be sent to the correct pharmacy. Could you assist? Callback number is [phone].

## 2023-09-16 ENCOUNTER — Other Ambulatory Visit: Payer: Self-pay

## 2023-09-16 ENCOUNTER — Telehealth: Payer: Self-pay | Admitting: Student

## 2023-09-16 ENCOUNTER — Other Ambulatory Visit: Payer: Self-pay | Admitting: Student

## 2023-09-16 ENCOUNTER — Telehealth: Payer: Self-pay

## 2023-09-16 DIAGNOSIS — F3170 Bipolar disorder, currently in remission, most recent episode unspecified: Secondary | ICD-10-CM

## 2023-09-16 MED ORDER — ZIPRASIDONE HCL 80 MG PO CAPS
80.0000 mg | ORAL_CAPSULE | Freq: Two times a day (BID) | ORAL | 3 refills | Status: AC
  Filled 2023-09-16 (×2): qty 90, 45d supply, fill #0
  Filled 2023-10-20: qty 90, 45d supply, fill #1
  Filled 2023-12-12: qty 90, 45d supply, fill #2
  Filled 2024-01-28: qty 90, 45d supply, fill #3

## 2023-09-16 NOTE — Telephone Encounter (Addendum)
 Pt was called / informed of  Geodon  refill.

## 2023-09-16 NOTE — Telephone Encounter (Unsigned)
 Copied from CRM #[id number]. Topic: Clinical - Medication Refill >> Sep 16, 2023  8:18 AM [name] F wrote: Patient called in with his mother , Mrs. Kyle Montgomery , on the line who relayed that the patient is completely out of the following medication. They have been attempting to get it filled but have been having trouble due to wanting it to be sent to the listed pharmacy and not [facility] [address] [facility] normally get it from.   Please contact the patient to further discuss the matter at [phone].   Medication: ziprasidone  (GEODON ) 80 MG capsule   Has the patient contacted their pharmacy? Yes   This is the patient's preferred pharmacy:   [place] [facility] [address], [address] [place] [address] Phone: [phone] Fax: [phone]  Is this the correct pharmacy for this prescription? Yes   Has the prescription been filled recently? No  Is the patient out of the medication? Yes  Has the patient been seen for an appointment in the last year OR does the patient have an upcoming appointment? Yes  Can we respond through MyChart? No  Agent: Please be advised that Rx refills may take up to 3 business days. We ask that you follow-up with your pharmacy.

## 2023-09-16 NOTE — Telephone Encounter (Signed)
>>   Sep 16, 2023  8:33 AM [name] F wrote: Patient and his mother experienced some confusion about which pharmacy the prescription needs to go to. Patient would like all prescriptions to go to this pharmacy from now on. Please add pharmacy to pharmacy list and delete others. Please submit prescription to :  [facility] [address], [place] [address] Phone: [phone]   Fax: [phone]     Copied from CRM [id number]. Topic: Clinical - Medication Refill >> Sep 16, 2023  8:18 AM [name] F wrote: Patient called in with his mother , Mrs. Hemi Chacko , on the line who relayed that the patient is completely out of the following medication. They have been attempting to get it filled but have been having trouble due to wanting it to be sent to the [facility] not [facility] [address] [facility] normally [facility] it from.   Please contact the patient to further discuss the matter at [phone].   Medication: ziprasidone  (GEODON ) 80 MG capsule   Has the patient contacted their pharmacy? Yes   This is the patient's preferred pharmacy:   [place] [facility] [address] [place] [address] Phone: [phone] Fax: [phone]  Is this the correct pharmacy for this prescription? Yes   Has the prescription been filled recently? No  Is the patient out of the medication? Yes  Has the patient been seen for an appointment in the last year OR does the patient have an upcoming appointment? Yes  Can we respond through MyChart? No  Agent: Please be advised that Rx refills may take up to 3 business days. We ask that you follow-up with your pharmacy.

## 2023-10-20 ENCOUNTER — Other Ambulatory Visit: Payer: Self-pay

## 2023-10-20 ENCOUNTER — Other Ambulatory Visit: Payer: Self-pay | Admitting: Student

## 2023-10-20 DIAGNOSIS — I10 Essential (primary) hypertension: Secondary | ICD-10-CM

## 2023-10-20 MED ORDER — LISINOPRIL-HYDROCHLOROTHIAZIDE 20-12.5 MG PO TABS
2.0000 | ORAL_TABLET | Freq: Every day | ORAL | 3 refills | Status: DC
  Filled 2023-10-20: qty 180, 90d supply, fill #0
  Filled 2023-12-23 – 2023-12-26 (×3): qty 180, 90d supply, fill #1

## 2023-10-20 MED FILL — Atorvastatin Calcium Tab 40 MG (Base Equivalent): ORAL | 90 days supply | Qty: 90 | Fill #0 | Status: AC

## 2023-10-20 NOTE — Telephone Encounter (Signed)
 Medication sent to pharmacy

## 2023-10-21 ENCOUNTER — Other Ambulatory Visit: Payer: Self-pay

## 2023-10-23 ENCOUNTER — Other Ambulatory Visit: Payer: Self-pay

## 2023-10-23 ENCOUNTER — Emergency Department (HOSPITAL_COMMUNITY)
Admission: EM | Admit: 2023-10-23 | Discharge: 2023-10-24 | Disposition: A | Discharge status: Home / Self Care | Attending: Emergency Medicine | Admitting: Emergency Medicine

## 2023-10-23 DIAGNOSIS — I129 Hypertensive chronic kidney disease with stage 1 through stage 4 chronic kidney disease, or unspecified chronic kidney disease: Secondary | ICD-10-CM | POA: Diagnosis not present

## 2023-10-23 DIAGNOSIS — M79603 Pain in arm, unspecified: Secondary | ICD-10-CM | POA: Diagnosis not present

## 2023-10-23 DIAGNOSIS — R5381 Other malaise: Secondary | ICD-10-CM | POA: Diagnosis not present

## 2023-10-23 DIAGNOSIS — M25519 Pain in unspecified shoulder: Secondary | ICD-10-CM | POA: Diagnosis not present

## 2023-10-23 DIAGNOSIS — R531 Weakness: Secondary | ICD-10-CM | POA: Insufficient documentation

## 2023-10-23 DIAGNOSIS — N189 Chronic kidney disease, unspecified: Secondary | ICD-10-CM | POA: Diagnosis not present

## 2023-10-23 DIAGNOSIS — R079 Chest pain, unspecified: Secondary | ICD-10-CM | POA: Diagnosis not present

## 2023-10-23 DIAGNOSIS — Z87891 Personal history of nicotine dependence: Secondary | ICD-10-CM | POA: Diagnosis not present

## 2023-10-23 DIAGNOSIS — M25512 Pain in left shoulder: Secondary | ICD-10-CM | POA: Insufficient documentation

## 2023-10-23 DIAGNOSIS — E1122 Type 2 diabetes mellitus with diabetic chronic kidney disease: Secondary | ICD-10-CM | POA: Insufficient documentation

## 2023-10-23 DIAGNOSIS — R059 Cough, unspecified: Secondary | ICD-10-CM | POA: Diagnosis not present

## 2023-10-23 DIAGNOSIS — R110 Nausea: Secondary | ICD-10-CM | POA: Diagnosis not present

## 2023-10-23 DIAGNOSIS — R053 Chronic cough: Secondary | ICD-10-CM | POA: Insufficient documentation

## 2023-10-23 NOTE — ED Triage Notes (Signed)
 Pt bib EMS from home for generalized weakness, unproductive cough, and nausea x1 week. Pt also reports pain with movement in left shoulder. Full ROM, pms intact, no reported injury, no noted deformity. No respiratory distress noted. GCS 15.  Pt denies vomiting, diarrhea, fever, abdominal pain. Hx of HTN, DM.   EMS reported: CBG - 136, VSS

## 2023-10-24 ENCOUNTER — Other Ambulatory Visit: Payer: Self-pay

## 2023-10-24 ENCOUNTER — Emergency Department (HOSPITAL_COMMUNITY)

## 2023-10-24 ENCOUNTER — Other Ambulatory Visit (HOSPITAL_COMMUNITY): Payer: Self-pay

## 2023-10-24 ENCOUNTER — Encounter (HOSPITAL_COMMUNITY): Payer: Self-pay

## 2023-10-24 DIAGNOSIS — M25512 Pain in left shoulder: Secondary | ICD-10-CM | POA: Diagnosis not present

## 2023-10-24 DIAGNOSIS — R079 Chest pain, unspecified: Secondary | ICD-10-CM | POA: Diagnosis not present

## 2023-10-24 DIAGNOSIS — R531 Weakness: Secondary | ICD-10-CM | POA: Diagnosis not present

## 2023-10-24 LAB — TROPONIN I (HIGH SENSITIVITY): Troponin I (High Sensitivity): 5 ng/L (ref <18)

## 2023-10-24 LAB — CBC
HCT: 35.8 % — ABNORMAL LOW (ref 39.0–52.0)
Hemoglobin: 11.6 g/dL — ABNORMAL LOW (ref 13.0–17.0)
MCH: 28.2 pg (ref 26.0–34.0)
MCHC: 32.4 g/dL (ref 30.0–36.0)
MCV: 86.9 fL (ref 80.0–100.0)
Platelets: 302 K/uL (ref 150–400)
RBC: 4.12 MIL/uL — ABNORMAL LOW (ref 4.22–5.81)
RDW: 13.2 % (ref 11.5–15.5)
WBC: 10.1 K/uL (ref 4.0–10.5)
nRBC: 0 % (ref 0.0–0.2)

## 2023-10-24 LAB — COMPREHENSIVE METABOLIC PANEL WITH GFR
ALT: 28 U/L (ref 0–44)
AST: 28 U/L (ref 15–41)
Albumin: 3 g/dL — ABNORMAL LOW (ref 3.5–5.0)
Alkaline Phosphatase: 59 U/L (ref 38–126)
Anion gap: 12 (ref 5–15)
BUN: 42 mg/dL — ABNORMAL HIGH (ref 6–20)
CO2: 24 mmol/L (ref 22–32)
Calcium: 8.8 mg/dL — ABNORMAL LOW (ref 8.9–10.3)
Chloride: 104 mmol/L (ref 98–111)
Creatinine, Ser: 2.5 mg/dL — ABNORMAL HIGH (ref 0.61–1.24)
GFR, Estimated: 31 mL/min — ABNORMAL LOW (ref >60)
Glucose, Bld: 134 mg/dL — ABNORMAL HIGH (ref 70–99)
Potassium: 3.2 mmol/L — ABNORMAL LOW (ref 3.5–5.1)
Sodium: 140 mmol/L (ref 135–145)
Total Bilirubin: 0.6 mg/dL (ref 0.0–1.2)
Total Protein: 7.1 g/dL (ref 6.5–8.1)

## 2023-10-24 MED ORDER — OXYCODONE HCL 5 MG PO TABS
5.0000 mg | ORAL_TABLET | Freq: Once | ORAL | Status: AC
  Administered 2023-10-24: 5 mg via ORAL
  Filled 2023-10-24: qty 1

## 2023-10-24 MED ORDER — NAPROXEN 500 MG PO TABS
500.0000 mg | ORAL_TABLET | Freq: Two times a day (BID) | ORAL | 0 refills | Status: DC
  Filled 2023-10-24 (×2): qty 30, 15d supply, fill #0

## 2023-10-24 MED ORDER — ONDANSETRON 4 MG PO TBDP
4.0000 mg | ORAL_TABLET | Freq: Once | ORAL | Status: AC
  Administered 2023-10-24: 4 mg via ORAL
  Filled 2023-10-24: qty 1

## 2023-10-24 NOTE — Discharge Instructions (Addendum)
 You were evaluated in the Emergency Department and after careful evaluation, we did not find any emergent condition requiring admission or further testing in the hospital.  Your exam/testing today is overall reassuring.  Symptoms likely due to muscular strain or spasm.  Recommend use of the Naprosyn  anti-inflammatory twice daily for pain.  Follow-up with your primary care doctor.  Please return to the Emergency Department if you experience any worsening of your condition.   Thank you for allowing us  to be a part of your care.

## 2023-10-24 NOTE — ED Provider Notes (Signed)
 [facility] Emergency Department Provider Note MRN:  [id number]  Arrival date & time: 10/24/23     Chief Complaint   Weakness and Cough   History of Present Illness   Kyle Montgomery is a 48 y.o. year-old male with a history of hypertension, CKD, bipolar disorder presenting to the ED with chief complaint of weakness.  Patient feeling sick for the past week.  Persistent cough, weakness, malaise.  Has been having nausea but no vomiting.  Also having persistent left shoulder pain, wondering if he slept on it wrong.  Denies trauma.  Hurts to move it.  Review of Systems  A thorough review of systems was obtained and all systems are negative except as noted in the HPI and PMH.   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 and fluphenazine   Hypertension    Renal insufficiency    baseline Cr 2.2 - renal US  with increased Echo signal, no nephrology eval, no bx    Past Surgical History:  Procedure Laterality Date   BOWEL DECOMPRESSION N/A 10/25/2020   Procedure: BOWEL DECOMPRESSION;  Surgeon: Saintclair Jasper, MD;  Location: [facility];  Service: Gastroenterology;  Laterality: N/A;   COLONOSCOPY N/A 10/25/2020   Procedure: COLONOSCOPY;  Surgeon: Saintclair Jasper, MD;  Location: [facility];  Service: Gastroenterology;  Laterality: N/A;   DENTAL SURGERY      Family History  Problem Relation Age of Onset   Hyperlipidemia Mother    Hypertension Mother    Atrial fibrillation Mother    Hypertension Brother    Hypertension Maternal Grandmother    Cancer Maternal Grandmother     Social History   Socioeconomic History   Marital status: Single    Spouse name: Not on file   Number of children: Not on file   Years of education: Not on file   Highest education level: Not on file  Occupational  History   Not on file  Tobacco Use   Smoking status: Former    Current packs/day: 0.00    Types: Cigarettes    Quit date: 01/22/1983    Years since quitting: 40.7   Smokeless tobacco: Never  Vaping Use   Vaping status: Never Used  Substance and Sexual Activity   Alcohol use: No    Alcohol/week: 0.0 standard drinks of alcohol   Drug use: No   Sexual activity: Not on file  Other Topics Concern   Not on file  Social History Narrative   Not on file   Social Drivers of Health   Financial Resource Strain: Not on file  Food Insecurity: No Food Insecurity (08/04/2023)   Hunger Vital Sign    Worried About Running Out of Food in the Last Year: Never true    Ran Out of Food in the Last Year: Never true  Transportation Needs: No Transportation Needs (08/04/2023)   PRAPARE - Administrator, Civil Service (Medical): No    Lack of Transportation (Non-Medical): No  Physical Activity: Insufficiently Active (08/04/2023)   Exercise Vital Sign    Days of Exercise per Week: 7 days    Minutes of Exercise per Session: 10 min  Stress: Not on file  Social Connections: Moderately Isolated (08/04/2023)   Social Connection and Isolation Panel    Frequency of Communication  with Friends and Family: More than three times a week    Frequency of Social Gatherings with Friends and Family: More than three times a week    Attends Religious Services: More than 4 times per year    Active Member of [employer] or Organizations: No    Attends Banker Meetings: Never    Marital Status: Never married  Intimate Partner Violence: Not At Risk (08/04/2023)   Humiliation, Afraid, Rape, and Kick questionnaire    Fear of Current or Ex-Partner: No    Emotionally Abused: No    Physically Abused: No    Sexually Abused: No     Physical Exam   Vitals:   10/24/23 0003 10/24/23 0032  BP: (!) 139/97   Pulse: 94   Resp: 16   SpO2: 99% 97%    CONSTITUTIONAL: Well-appearing, NAD NEURO/PSYCH:  Alert and  oriented x 3, no focal deficits EYES:  eyes equal and reactive ENT/NECK:  no LAD, no JVD CARDIO: Regular rate, well-perfused, normal S1 and S2 PULM:  CTAB no wheezing or rhonchi GI/GU:  non-distended, non-tender MSK/SPINE:  No gross deformities, no edema SKIN:  no rash, atraumatic   *Additional and/or pertinent findings included in MDM below  Diagnostic and Interventional Summary    EKG Interpretation Date/Time:  Friday October 24 2023 00:44:02 EDT Ventricular Rate:  99 PR Interval:  132 QRS Duration:  98 QT Interval:  410 QTC Calculation: 526 R Axis:   -18  Text Interpretation: Normal sinus rhythm Minimal voltage criteria for LVH, may be normal variant ( R in aVL ) Cannot rule out Anterior infarct , age undetermined Prolonged QT Abnormal ECG When compared with ECG of 23-Oct-2020 07:02, PREVIOUS ECG IS PRESENT Confirmed by Theadore Sharper [phone]) on 10/24/2023 1:38:42 AM       Labs Reviewed  CBC - Abnormal; Notable for the following components:      Result Value   RBC 4.12 (*)    Hemoglobin 11.6 (*)    HCT 35.8 (*)    All other components within normal limits  COMPREHENSIVE METABOLIC PANEL WITH GFR - Abnormal; Notable for the following components:   Potassium 3.2 (*)    Glucose, Bld 134 (*)    BUN 42 (*)    Creatinine, Ser 2.50 (*)    Calcium  8.8 (*)    Albumin  3.0 (*)    GFR, Estimated 31 (*)    All other components within normal limits  TROPONIN I (HIGH SENSITIVITY)    DG Shoulder Left  Final Result    DG Chest 2 View  Final Result      Medications  oxyCODONE  (Oxy IR/ROXICODONE ) immediate release tablet 5 mg (5 mg Oral Given 10/24/23 0137)  ondansetron  (ZOFRAN -ODT) disintegrating tablet 4 mg (4 mg Oral Given 10/24/23 0136)     Procedures  /  Critical Care Procedures  ED Course and Medical Decision Making  Initial Impression and Ddx Suspect viral illness possibly also with MSK related shoulder pain.  Hurts to move the shoulder, pain elicited with passive  range of motion.  No erythema or fever or increased warmth to the shoulder to suggest septic joint.  However given the nausea and the fact that the shoulder pain seems to extend into the chest, atypical presentation of ACS is also considered.  Electrolyte disturbance considered as well.  Past medical/surgical history that increases complexity of ED encounter: Bipolar disorder  Interpretation of Diagnostics I personally reviewed the EKG and my interpretation is as follows: Sinus rhythm  with nonspecific finding  No significant blood count or electrolyte disturbance.  Troponin negative  Patient Reassessment and Ultimate Disposition/Management     Discharge  Patient management required discussion with the following services or consulting groups:  None  Complexity of Problems Addressed Acute illness or injury that poses threat of life of bodily function  Additional Data Reviewed and Analyzed Further history obtained from: Prior labs/imaging results  Additional Factors Impacting ED Encounter Risk Consideration of hospitalization  Ozell HERO. Theadore, MD [facility] Emergency Medicine [facility] [email or web address]  Final Clinical Impressions(s) / ED Diagnoses     ICD-10-CM   1. Acute pain of left shoulder  M25.512       ED Discharge Orders          Ordered    naproxen  (NAPROSYN ) 500 MG tablet  2 times daily        10/24/23 0211             Discharge Instructions Discussed with and Provided to Patient:     Discharge Instructions      You were evaluated in the Emergency Department and after careful evaluation, we did not find any emergent condition requiring admission or further testing in the hospital.  Your exam/testing today is overall reassuring.  Symptoms likely due to muscular strain or spasm.  Recommend use of the Naprosyn  anti-inflammatory twice daily for pain.  Follow-up with your primary care doctor.  Please return to the Emergency Department  if you experience any worsening of your condition.   Thank you for allowing us  to be a part of your care.       Theadore Ozell HERO, MD 10/24/23 [phone]

## 2023-10-24 NOTE — ED Notes (Signed)
 Patient transported to X-ray

## 2023-11-06 ENCOUNTER — Encounter: Admitting: Student

## 2023-11-28 ENCOUNTER — Other Ambulatory Visit: Payer: Self-pay | Admitting: Student

## 2023-11-28 ENCOUNTER — Other Ambulatory Visit: Payer: Self-pay

## 2023-11-28 ENCOUNTER — Ambulatory Visit: Payer: Self-pay

## 2023-11-28 ENCOUNTER — Emergency Department (HOSPITAL_COMMUNITY)
Admission: EM | Admit: 2023-11-28 | Discharge: 2023-11-28 | Disposition: A | Discharge status: Home / Self Care | Attending: Emergency Medicine | Admitting: Emergency Medicine

## 2023-11-28 ENCOUNTER — Other Ambulatory Visit (HOSPITAL_COMMUNITY): Payer: Self-pay

## 2023-11-28 ENCOUNTER — Encounter (HOSPITAL_COMMUNITY): Payer: Self-pay

## 2023-11-28 DIAGNOSIS — G8929 Other chronic pain: Secondary | ICD-10-CM | POA: Insufficient documentation

## 2023-11-28 DIAGNOSIS — M25552 Pain in left hip: Secondary | ICD-10-CM | POA: Diagnosis not present

## 2023-11-28 DIAGNOSIS — F3170 Bipolar disorder, currently in remission, most recent episode unspecified: Secondary | ICD-10-CM

## 2023-11-28 MED ORDER — DICLOFENAC SODIUM 1 % EX GEL
2.0000 g | Freq: Four times a day (QID) | CUTANEOUS | 0 refills | Status: DC
  Filled 2023-11-28: qty 200, 25d supply, fill #0

## 2023-11-28 MED ORDER — ACETAMINOPHEN ER 650 MG PO TBCR
650.0000 mg | EXTENDED_RELEASE_TABLET | Freq: Three times a day (TID) | ORAL | 0 refills | Status: DC | PRN
Start: 2023-11-28 — End: 2023-12-04
  Filled 2023-11-28: qty 60, 20d supply, fill #0

## 2023-11-28 MED FILL — Amlodipine Besylate Tab 10 MG (Base Equivalent): ORAL | 90 days supply | Qty: 90 | Fill #1 | Status: AC

## 2023-11-28 NOTE — ED Provider Notes (Signed)
 [facility] AT [facility] Provider Note   CSN: [id number] Arrival date & time: 11/28/23  1437     Patient presents with: Hip Pain (/)   Kyle Montgomery is a 48 y.o. male with a past medical history of pelvic fracture.  Patient has a history of chronic left hip pain.  Unfortunately his transmission went out and he is having to do a lot more walking to catch the bus and is now having increased hip pain.  Patient states that he just wants refills on his Tylenol  and Voltaren  gel.  He was unable to get in with his PCP today they sent him here.  He reports that he has no new pain, no weakness no back pain or saddle anesthesia.    Hip Pain       Prior to Admission medications   Medication Sig Start Date End Date Taking? Authorizing Provider  acetaminophen  (QC ACETAMINOPHEN  8HR ARTH PAIN) 650 MG CR tablet Take 1 tablet (650 mg total) by mouth every 6 (six) hours. 09/02/23   Norrine Sharper, MD  amLODipine  (NORVASC ) 10 MG tablet TAKE 1 TABLET BY MOUTH EVERY DAY 02/17/23   Norrine Sharper, MD  atorvastatin  (LIPITOR) 40 MG tablet TAKE 1 TABLET BY MOUTH EVERY DAY 04/17/23   Norrine Sharper, MD  diclofenac  Sodium (VOLTAREN ) 1 % GEL Apply 2 g topically 4 (four) times daily. 09/02/23   Norrine Sharper, MD  divalproex  (DEPAKOTE  ER) 500 MG 24 hr tablet Take 1 tablet (500 mg total) by mouth at bedtime. 09/02/23   Norrine Sharper, MD  empagliflozin  (JARDIANCE ) 10 MG TABS tablet Take 1 tablet (10 mg total) by mouth daily before breakfast. 08/04/23   Nooruddin, Saad, MD  lisinopril -hydrochlorothiazide  (ZESTORETIC ) 20-12.5 MG tablet Take 2 tablets by mouth daily. 10/20/23   Norrine Sharper, MD  naproxen  (NAPROSYN ) 500 MG tablet Take 1 tablet (500 mg total) by mouth 2 (two) times daily. 10/24/23   Theadore Sharper HERO, MD  ziprasidone  (GEODON ) 80 MG capsule Take 1 capsule (80 mg total) by mouth 2 (two) times daily. 09/16/23   Kandis Perkins, DO    Allergies: Patient has no known  allergies.    Review of Systems  Updated Vital Signs BP (!) 136/99 (BP Location: Left Arm)   Pulse 95   Temp 99 F (37.2 C)   Resp 18   Ht 5' 10 (1.778 m)   Wt 107.5 kg   SpO2 93%   BMI 34.01 kg/m   Physical Exam Vitals and nursing note reviewed.  Constitutional:      General: He is not in acute distress.    Appearance: He is well-developed. He is not diaphoretic.  HENT:     Head: Normocephalic and atraumatic.  Eyes:     General: No scleral icterus.    Conjunctiva/sclera: Conjunctivae normal.  Cardiovascular:     Rate and Rhythm: Normal rate and regular rhythm.     Heart sounds: Normal heart sounds.  Pulmonary:     Effort: Pulmonary effort is normal. No respiratory distress.     Breath sounds: Normal breath sounds.  Abdominal:     Palpations: Abdomen is soft.     Tenderness: There is no abdominal tenderness.  Musculoskeletal:     Cervical back: Normal range of motion and neck supple.     Comments: Pain with passive internal rotation of the hip, ambulatory with antalgic gait, neurovascularly intact  Skin:    General: Skin is warm and dry.  Neurological:  Mental Status: He is alert.  Psychiatric:        Behavior: Behavior normal.     (all labs ordered are listed, but only abnormal results are displayed) Labs Reviewed - No data to display  EKG: None  Radiology: No results found.   Procedures   Medications Ordered in the ED - No data to display                                  Medical Decision Making  Patient here for medication refill due to chronic hip pain increased walking history of pelvic fracture doubt septic joint or other emergent cause.  Will discharge with Voltaren  gel and Tylenol .  Follow-up with PCP.  Discussed return precautions.     Final diagnoses:  None    ED Discharge Orders     None          Arloa Chroman, PA-C 11/28/23 1542    Geraldene Hamilton, MD 12/02/23 1313

## 2023-11-28 NOTE — Telephone Encounter (Signed)
  FYI Only or Action Required?: Action required by provider: request for appointment and clinical question for provider.  Patient was last seen in primary care on 09/02/2023 by Norrine Sharper, MD.  Called Nurse Triage reporting Hip Pain.  Symptoms began several days ago.  Interventions attempted: OTC medications: tylenol .  Symptoms are: unchanged.  Triage Disposition: See Physician Within 24 Hours  Patient/caregiver understands and will follow disposition?: Yes  Copied from CRM [id number]. Topic: Clinical - Red Word Triage >> Nov 28, 2023 11:05 AM Kyle Montgomery wrote: Red Word that prompted transfer to Nurse Triage: hip pain - previous injury can barely walk on it - no medication - provider told to call if he had problems with it again. Reason for Disposition  [1] Painful rash AND [2] multiple small blisters grouped together (i.e., dermatomal distribution or band or stripe)    Pain only  Answer Assessment - Initial Assessment Questions No available appts today. Patient requesting appt for Monday.  Advised UC now and ED if symptoms worsen.  Patient reports going to UC.  1. LOCATION and RADIATION: Where is the pain located? Does the pain spread (shoot) anywhere else?     Left hip pain; 3 years ago fractured; only pain in hip Able to stand and walk, but walking slowly 2. QUALITY: What does the pain feel like?  (e.g., sharp, dull, aching, burning)     Aching and sharp; denies swelling, redness, weakness/ numbness 3. SEVERITY: How bad is the pain? What does it keep you from doing?   (Scale 1-10; or mild, moderate, severe)     10/10; arthitis tylenol ; not effective; requesting cream and pain medication that was given before 4. ONSET: When did the pain start? Does it come and go, or is it there all the time?     3 weeks 7. AGGRAVATING FACTORS: What makes the hip pain worse? (e.g., walking, climbing stairs, running)     Walking makes it worse, but hurts at rest 8.  OTHER SYMPTOMS: Do you have any other symptoms? (e.g., back pain, pain shooting down leg,  fever, rash)     denies  Protocols used: Hip Pain-A-AH

## 2023-11-28 NOTE — ED Triage Notes (Signed)
 Patient states that 3 years ago he broke his pelvis and his PCP gave him some cream that helped with the pain. He is here today because he states his hip is hurting and he is unable to walk normally. He states he wants some pain meds to help him where he is able to walk.

## 2023-11-28 NOTE — Discharge Instructions (Addendum)
 Contact a health care provider if: You cannot put weight on your leg. Your pain or swelling gets worse after a week. It gets harder to walk. You have a fever. Get help right away if: You fall. You have a sudden increase in pain and swelling in your hip. Your hip is red or swollen or very tender to touch.

## 2023-11-28 NOTE — Telephone Encounter (Signed)
 Called pt who stated he's going to ED.

## 2023-11-29 ENCOUNTER — Other Ambulatory Visit: Payer: Self-pay

## 2023-11-29 MED ORDER — DIVALPROEX SODIUM ER 500 MG PO TB24
500.0000 mg | ORAL_TABLET | Freq: Every day | ORAL | 0 refills | Status: DC
  Filled 2023-11-29: qty 90, 90d supply, fill #0

## 2023-12-01 ENCOUNTER — Other Ambulatory Visit: Payer: Self-pay

## 2023-12-02 ENCOUNTER — Telehealth: Payer: Self-pay

## 2023-12-02 ENCOUNTER — Other Ambulatory Visit: Payer: Self-pay

## 2023-12-02 ENCOUNTER — Telehealth: Payer: Self-pay | Admitting: *Deleted

## 2023-12-02 NOTE — Telephone Encounter (Signed)
 Prior Authorization for patient (Jardiance  10MG  tablets) came through on cover my meds was submitted with last office notes and labs awaiting approval or denial.  [id number]

## 2023-12-02 NOTE — Telephone Encounter (Signed)
 Call to [facility].  Patient has 1 med due for a refill.  Will fill and call patient.  Patient was informed that he only had 1 medication that needed a refill.. Copied from CRM [id number]. Topic: Clinical - Medication Question >> Dec 02, 2023 10:12 AM Miquel SAILOR wrote: Reason for CRM: Pt requesting all medication going to this pharmacy.   [facility] [address], [address] [place] [address] Phone: [phone] Fax: [phone] Hours: M-F 7:30am-7:00p

## 2023-12-03 ENCOUNTER — Other Ambulatory Visit (HOSPITAL_COMMUNITY): Payer: Self-pay

## 2023-12-03 NOTE — Telephone Encounter (Signed)
 YOU ASKED FOR: Service Description Code 1 Code 2 Plan Requested  Dates Requested  Amount JARDIANCE  10MG  Tablet [id number] Medicaid 12/02/2023 to  03/03/2024 90 WE DENIED: Service Description Code 1 Code 2 Plan Denied Dates Denied  Amount JARDIANCE  10MG  Tablet [id number] Medicaid 12/02/2023 to  03/03/2024 90 We denied your request for:   [id number] JARDIANCE  10MG  Tablet Policy rules found at [facility] Division of Health Benefits Outpatient Pharmacy Prior Approval Criteria for SGLT2  Inhibitors and Combinations guided our decision. Here are the policy requirements your request did not meet:   Your doctor must give us  information that shows (such as chart notes and labs) your medical condition   has improved while on the medication. We cannot approve the request because your doctor did not send this information.    Appeal information has been placed in the blue team box.

## 2023-12-03 NOTE — Telephone Encounter (Signed)
 I called and spoke to the patient and his mother,both stated the patient does not take this medication.

## 2023-12-04 ENCOUNTER — Ambulatory Visit (INDEPENDENT_AMBULATORY_CARE_PROVIDER_SITE_OTHER)

## 2023-12-04 ENCOUNTER — Other Ambulatory Visit (HOSPITAL_COMMUNITY): Payer: Self-pay

## 2023-12-04 ENCOUNTER — Other Ambulatory Visit: Payer: Self-pay

## 2023-12-04 ENCOUNTER — Ambulatory Visit: Payer: Self-pay

## 2023-12-04 ENCOUNTER — Other Ambulatory Visit: Payer: Self-pay | Admitting: Student

## 2023-12-04 VITALS — BP 122/74 | HR 91 | Temp 97.7°F | Ht 70.0 in | Wt 221.2 lb

## 2023-12-04 DIAGNOSIS — Z8781 Personal history of (healed) traumatic fracture: Secondary | ICD-10-CM

## 2023-12-04 DIAGNOSIS — M25552 Pain in left hip: Secondary | ICD-10-CM

## 2023-12-04 DIAGNOSIS — Z8739 Personal history of other diseases of the musculoskeletal system and connective tissue: Secondary | ICD-10-CM

## 2023-12-04 MED ORDER — HYDROCODONE-ACETAMINOPHEN 5-325 MG PO TABS
1.0000 | ORAL_TABLET | Freq: Four times a day (QID) | ORAL | 0 refills | Status: DC | PRN
  Filled 2023-12-04: qty 20, 5d supply, fill #0

## 2023-12-04 MED ORDER — LIDOCAINE 5 % EX PTCH
1.0000 | MEDICATED_PATCH | CUTANEOUS | 0 refills | Status: AC
  Filled 2023-12-04: qty 10, 10d supply, fill #0

## 2023-12-04 MED ORDER — LIDOCAINE 5 % EX PTCH
1.0000 | MEDICATED_PATCH | CUTANEOUS | 0 refills | Status: DC
  Filled 2023-12-04: qty 10, 10d supply, fill #0
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
## 2023-12-04 NOTE — Telephone Encounter (Signed)
 FYI Only or Action Required?: FYI only for provider: appointment scheduled on 12/04/2023 at 3:45 PM.  Patient was last seen in primary care on 09/02/2023 by Norrine Sharper, MD.  Called Nurse Triage reporting Hip Pain.  Symptoms began several weeks ago.  Interventions attempted: Rest, hydration, or home remedies.  Symptoms are: unchanged.  Triage Disposition: See HCP Within 4 Hours (Or PCP Triage)  Patient/caregiver understands and will follow disposition?: Yes  Copied from CRM #[id number]. Topic: Clinical - Red Word Triage >> Dec 04, 2023 12:18 PM Diannia H wrote: [facility] that prompted transfer to Nurse Triage: hip pain - previous injury can barely walk on it - no medication - provider told to call if he had problems with it again. Patient has some medicine he's taking but its not working. He is in pain, he's walking so slow because of the pain. Reason for Disposition  [1] SEVERE pain (e.g., excruciating, unable to do any normal activities) AND [2] not improved after 2 hours of pain medicine  Answer Assessment - Initial Assessment Questions Chronic Hip pain that has increased over the last couple of weeks. Mother of patient states patient is walking slower due to pain. Having difficulty walking up stairs. Scheduled to patient for today at 3:45 PM. Mother was asking if there was a taxi that could be set up to pick patient up if she was unable to get a ride for the patient. Needing a follow up call in regards to transportation.   1. LOCATION and RADIATION: Where is the pain located? Does the pain spread (shoot) anywhere else?     Left Hip-radiates down his leg 2. QUALITY: What does the pain feel like?  (e.g., sharp, dull, aching, burning)     aching 3. SEVERITY: How bad is the pain? What does it keep you from doing?   (Scale 1-10; or mild, moderate, severe)     severe 4. ONSET: When did the pain start? Does it come and go, or is it there all the time?     Chronic pain but  pain has increased over the last week 5. WORK OR EXERCISE: Has there been any recent work or exercise that involved this part of the body?      no 6. CAUSE: What do you think is causing the hip pain?      unsure 7. AGGRAVATING FACTORS: What makes the hip pain worse? (e.g., walking, climbing stairs, running)     Climbing stairs 8. OTHER SYMPTOMS: Do you have any other symptoms? (e.g., back pain, pain shooting down leg,  fever, rash)     Back pain, pain shooting down leg  Protocols used: Hip Pain-A-AH
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
## 2023-12-04 NOTE — Patient Instructions (Addendum)
 Thank you, Mr.Edrees L Butkus for allowing us  to provide your care today. Today we discussed your new hip pain.     XR of your hips to make sure no fractures are there  New medications: -Hydrocodone-Acetaminophen  5-325mg , take 1 tablet every 6 hours as needed for the next 5 days for pain -Lidocaine  patches 5% for pain    My Chart Access: [email or web address]  Please follow-up in: 2 weeks to check your hip pain     We look forward to seeing you next time. Please call our clinic at [phone] if you have any questions or concerns. The best time to call is Monday-Friday from 9am-4pm, but there is someone available 24/7. If after hours or the weekend, call the main hospital number and ask for the Internal Medicine Resident On-Call. If you need medication refills, please notify your pharmacy one week in advance and they will send us  a request.   Thank you for letting us  take part in your care. Wishing you the best!  Abriel Geesey, DO 12/04/2023, 1:21 PM [facility]

## 2023-12-04 NOTE — Telephone Encounter (Signed)
 If he is not taking the medication can you remove the medication from his med list.

## 2023-12-04 NOTE — Telephone Encounter (Signed)
 RTC to patient spoke with the Patient's mother. Patient has someone to bring him for the appointment as patient is unable to walk.

## 2023-12-08 ENCOUNTER — Other Ambulatory Visit (HOSPITAL_COMMUNITY): Payer: Self-pay

## 2023-12-08 ENCOUNTER — Other Ambulatory Visit: Payer: Self-pay

## 2023-12-12 ENCOUNTER — Other Ambulatory Visit (HOSPITAL_COMMUNITY): Payer: Self-pay

## 2023-12-12 ENCOUNTER — Other Ambulatory Visit: Payer: Self-pay

## 2023-12-12 NOTE — Progress Notes (Signed)
 Internal Medicine Clinic Attending  I was physically present during the key portions of the resident provided service and participated in the medical decision making of patient's management care. I reviewed pertinent patient test results.  The assessment, diagnosis, and plan were formulated together and I agree with the documentation in the resident's note.  Jeanelle Layman CROME, MD

## 2023-12-23 ENCOUNTER — Other Ambulatory Visit (HOSPITAL_COMMUNITY): Payer: Self-pay

## 2023-12-24 ENCOUNTER — Ambulatory Visit (HOSPITAL_COMMUNITY): Admission: RE | Admit: 2023-12-24 | Discharge: 2023-12-24 | Disposition: A | Source: Ambulatory Visit

## 2023-12-24 ENCOUNTER — Ambulatory Visit

## 2023-12-24 ENCOUNTER — Other Ambulatory Visit (HOSPITAL_COMMUNITY): Payer: Self-pay

## 2023-12-24 ENCOUNTER — Other Ambulatory Visit: Payer: Self-pay

## 2023-12-24 DIAGNOSIS — R52 Pain, unspecified: Secondary | ICD-10-CM | POA: Diagnosis not present

## 2023-12-24 DIAGNOSIS — M25552 Pain in left hip: Secondary | ICD-10-CM | POA: Diagnosis not present

## 2023-12-24 MED ORDER — HYDROCODONE-ACETAMINOPHEN 5-325 MG PO TABS
1.0000 | ORAL_TABLET | Freq: Every day | ORAL | 0 refills | Status: AC | PRN
  Filled 2023-12-24: qty 5, 5d supply, fill #0
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
## 2023-12-25 ENCOUNTER — Other Ambulatory Visit (HOSPITAL_COMMUNITY): Payer: Self-pay

## 2023-12-25 ENCOUNTER — Telehealth: Payer: Self-pay | Admitting: *Deleted

## 2023-12-25 NOTE — Telephone Encounter (Signed)
 Will forward to Dr. Nguyen.                Copied from CRM #[id number]. Topic: Clinical - Lab/Test Results >> Dec 25, 2023 12:18 PM Graeme ORN wrote: Reason for CRM: Patient called to check on imaging results - . Let pt know have not yet finalized. Patient would like a call back with results once reviewed. Thank You

## 2023-12-26 ENCOUNTER — Other Ambulatory Visit: Payer: Self-pay

## 2023-12-26 ENCOUNTER — Other Ambulatory Visit (HOSPITAL_COMMUNITY): Payer: Self-pay

## 2023-12-27 ENCOUNTER — Observation Stay (HOSPITAL_COMMUNITY)
Admission: EM | Admit: 2023-12-27 | Discharge: 2023-12-28 | Disposition: A | Attending: Internal Medicine | Admitting: Internal Medicine

## 2023-12-27 ENCOUNTER — Other Ambulatory Visit: Payer: Self-pay

## 2023-12-27 ENCOUNTER — Emergency Department (HOSPITAL_COMMUNITY)

## 2023-12-27 ENCOUNTER — Encounter (HOSPITAL_COMMUNITY): Payer: Self-pay

## 2023-12-27 DIAGNOSIS — R509 Fever, unspecified: Principal | ICD-10-CM

## 2023-12-27 DIAGNOSIS — M25552 Pain in left hip: Secondary | ICD-10-CM | POA: Diagnosis not present

## 2023-12-27 DIAGNOSIS — F319 Bipolar disorder, unspecified: Secondary | ICD-10-CM | POA: Diagnosis not present

## 2023-12-27 DIAGNOSIS — I129 Hypertensive chronic kidney disease with stage 1 through stage 4 chronic kidney disease, or unspecified chronic kidney disease: Secondary | ICD-10-CM | POA: Diagnosis not present

## 2023-12-27 DIAGNOSIS — E8809 Other disorders of plasma-protein metabolism, not elsewhere classified: Secondary | ICD-10-CM | POA: Insufficient documentation

## 2023-12-27 DIAGNOSIS — Z8739 Personal history of other diseases of the musculoskeletal system and connective tissue: Secondary | ICD-10-CM | POA: Diagnosis not present

## 2023-12-27 DIAGNOSIS — Z7901 Long term (current) use of anticoagulants: Secondary | ICD-10-CM | POA: Insufficient documentation

## 2023-12-27 DIAGNOSIS — R6510 Systemic inflammatory response syndrome (SIRS) of non-infectious origin without acute organ dysfunction: Secondary | ICD-10-CM | POA: Diagnosis present

## 2023-12-27 DIAGNOSIS — M109 Gout, unspecified: Secondary | ICD-10-CM | POA: Diagnosis not present

## 2023-12-27 DIAGNOSIS — E1122 Type 2 diabetes mellitus with diabetic chronic kidney disease: Secondary | ICD-10-CM | POA: Insufficient documentation

## 2023-12-27 DIAGNOSIS — E119 Type 2 diabetes mellitus without complications: Secondary | ICD-10-CM

## 2023-12-27 DIAGNOSIS — D72829 Elevated white blood cell count, unspecified: Secondary | ICD-10-CM

## 2023-12-27 DIAGNOSIS — I10 Essential (primary) hypertension: Secondary | ICD-10-CM | POA: Diagnosis not present

## 2023-12-27 DIAGNOSIS — E785 Hyperlipidemia, unspecified: Secondary | ICD-10-CM | POA: Diagnosis present

## 2023-12-27 DIAGNOSIS — Z0389 Encounter for observation for other suspected diseases and conditions ruled out: Secondary | ICD-10-CM | POA: Diagnosis not present

## 2023-12-27 DIAGNOSIS — M25571 Pain in right ankle and joints of right foot: Secondary | ICD-10-CM | POA: Diagnosis not present

## 2023-12-27 DIAGNOSIS — Z79899 Other long term (current) drug therapy: Secondary | ICD-10-CM | POA: Insufficient documentation

## 2023-12-27 DIAGNOSIS — M461 Sacroiliitis, not elsewhere classified: Secondary | ICD-10-CM | POA: Diagnosis not present

## 2023-12-27 DIAGNOSIS — N1832 Chronic kidney disease, stage 3b: Principal | ICD-10-CM | POA: Diagnosis present

## 2023-12-27 DIAGNOSIS — M10371 Gout due to renal impairment, right ankle and foot: Secondary | ICD-10-CM

## 2023-12-27 DIAGNOSIS — K4020 Bilateral inguinal hernia, without obstruction or gangrene, not specified as recurrent: Secondary | ICD-10-CM | POA: Diagnosis not present

## 2023-12-27 DIAGNOSIS — Z8719 Personal history of other diseases of the digestive system: Secondary | ICD-10-CM | POA: Diagnosis not present

## 2023-12-27 LAB — URINALYSIS, W/ REFLEX TO CULTURE (INFECTION SUSPECTED)
Bacteria, UA: NONE SEEN
Bilirubin Urine: NEGATIVE
Glucose, UA: NEGATIVE mg/dL
Hgb urine dipstick: NEGATIVE
Ketones, ur: NEGATIVE mg/dL
Leukocytes,Ua: NEGATIVE
Nitrite: NEGATIVE
Protein, ur: 100 mg/dL — AB
Specific Gravity, Urine: 1.008 (ref 1.005–1.030)
pH: 6 (ref 5.0–8.0)

## 2023-12-27 LAB — CBC WITH DIFFERENTIAL/PLATELET
Abs Immature Granulocytes: 0.06 K/uL (ref 0.00–0.07)
Basophils Absolute: 0.1 K/uL (ref 0.0–0.1)
Basophils Relative: 1 %
Eosinophils Absolute: 0.1 K/uL (ref 0.0–0.5)
Eosinophils Relative: 1 %
HCT: 34.4 % — ABNORMAL LOW (ref 39.0–52.0)
Hemoglobin: 11.1 g/dL — ABNORMAL LOW (ref 13.0–17.0)
Immature Granulocytes: 1 %
Lymphocytes Relative: 13 %
Lymphs Abs: 1.8 K/uL (ref 0.7–4.0)
MCH: 27.7 pg (ref 26.0–34.0)
MCHC: 32.3 g/dL (ref 30.0–36.0)
MCV: 85.8 fL (ref 80.0–100.0)
Monocytes Absolute: 1 K/uL (ref 0.1–1.0)
Monocytes Relative: 8 %
Neutro Abs: 10.1 K/uL — ABNORMAL HIGH (ref 1.7–7.7)
Neutrophils Relative %: 76 %
Platelets: 349 K/uL (ref 150–400)
RBC: 4.01 MIL/uL — ABNORMAL LOW (ref 4.22–5.81)
RDW: 14 % (ref 11.5–15.5)
WBC: 13.1 K/uL — ABNORMAL HIGH (ref 4.0–10.5)
nRBC: 0 % (ref 0.0–0.2)

## 2023-12-27 LAB — SEDIMENTATION RATE: Sed Rate: 86 mm/h — ABNORMAL HIGH (ref 0–16)

## 2023-12-27 LAB — COMPREHENSIVE METABOLIC PANEL WITH GFR
ALT: 27 U/L (ref 0–44)
AST: 23 U/L (ref 15–41)
Albumin: 3 g/dL — ABNORMAL LOW (ref 3.5–5.0)
Alkaline Phosphatase: 48 U/L (ref 38–126)
Anion gap: 14 (ref 5–15)
BUN: 17 mg/dL (ref 6–20)
CO2: 25 mmol/L (ref 22–32)
Calcium: 9.5 mg/dL (ref 8.9–10.3)
Chloride: 105 mmol/L (ref 98–111)
Creatinine, Ser: 2.18 mg/dL — ABNORMAL HIGH (ref 0.61–1.24)
GFR, Estimated: 36 mL/min — ABNORMAL LOW (ref >60)
Glucose, Bld: 124 mg/dL — ABNORMAL HIGH (ref 70–99)
Potassium: 4 mmol/L (ref 3.5–5.1)
Sodium: 144 mmol/L (ref 135–145)
Total Bilirubin: 0.5 mg/dL (ref 0.0–1.2)
Total Protein: 7.5 g/dL (ref 6.5–8.1)

## 2023-12-27 LAB — I-STAT CG4 LACTIC ACID, ED
Lactic Acid, Venous: 2 mmol/L (ref 0.5–1.9)
Lactic Acid, Venous: 0.9 mmol/L (ref 0.5–1.9)

## 2023-12-27 LAB — PROTIME-INR
INR: 1.1 (ref 0.8–1.2)
Prothrombin Time: 14.4 s (ref 11.4–15.2)

## 2023-12-27 LAB — C-REACTIVE PROTEIN: CRP: 6.3 mg/dL — ABNORMAL HIGH (ref <1.0)

## 2023-12-27 MED ORDER — GADOBUTROL 1 MMOL/ML IV SOLN
10.0000 mL | Freq: Once | INTRAVENOUS | Status: AC | PRN
  Administered 2023-12-27: 10 mL via INTRAVENOUS

## 2023-12-27 MED ORDER — ONDANSETRON HCL 4 MG/2ML IJ SOLN
4.0000 mg | Freq: Once | INTRAMUSCULAR | Status: AC
  Administered 2023-12-27: 4 mg via INTRAVENOUS
  Filled 2023-12-27: qty 2

## 2023-12-27 MED ORDER — DIAZEPAM 5 MG/ML IJ SOLN
5.0000 mg | Freq: Once | INTRAMUSCULAR | Status: AC
  Administered 2023-12-27: 5 mg via INTRAVENOUS
  Filled 2023-12-27: qty 2

## 2023-12-27 MED ORDER — MORPHINE SULFATE (PF) 4 MG/ML IV SOLN
4.0000 mg | Freq: Once | INTRAVENOUS | Status: AC
  Administered 2023-12-27: 4 mg via INTRAVENOUS
  Filled 2023-12-27: qty 1

## 2023-12-27 NOTE — ED Triage Notes (Signed)
 Pt states hx of chronic LLE pain.  States was carrying a heavy book bag and heavy groceries this week and now pain has increased.

## 2023-12-27 NOTE — ED Provider Notes (Signed)
  EMERGENCY DEPARTMENT AT [facility] Provider Note   CSN: [id number] Arrival date & time: 12/27/23  1633     Patient presents with: Leg Pain   Kyle Montgomery is a 48 y.o. male.   48 year old male with past medical history of pelvic fracture in the past as well as hypertension, hyperlipidemia, and history of septic arthritis presenting to the emergency department today with complaints of pain in his back and left hip.  The patient states that this has been going on for quite some time.  Denies any exacerbating or alleviating factors other than movement which does make the pain worse.  He denies any fevers at home but was found to have a temp of 100.5 on arrival here.  Reports that he is having difficulty walking secondary to this.  He denies any bowel or bladder dysfunction has not had any saddle anesthesia.   Leg Pain      Prior to Admission medications   Medication Sig Start Date End Date Taking? Authorizing Provider  amLODipine  (NORVASC ) 10 MG tablet TAKE 1 TABLET BY MOUTH EVERY DAY 02/17/23   Norrine Sharper, MD  atorvastatin  (LIPITOR) 40 MG tablet TAKE 1 TABLET BY MOUTH EVERY DAY 04/17/23   Norrine Sharper, MD  diclofenac  Sodium (VOLTAREN ) 1 % GEL Apply 2 g topically 4 (four) times daily. 11/28/23   Harris, Abigail, PA-C  divalproex  (DEPAKOTE  ER) 500 MG 24 hr tablet Take 1 tablet (500 mg total) by mouth at bedtime. 11/29/23   Norrine Sharper, MD  HYDROcodone -acetaminophen  (NORCO/VICODIN) 5-325 MG tablet Take 1 tablet by mouth daily as needed for up to 5 days for severe pain (pain score 7-10). 12/24/23 12/29/23  Nguyen, Diana, MD  lisinopril -hydrochlorothiazide  (ZESTORETIC ) 20-12.5 MG tablet Take 2 tablets by mouth daily. 10/20/23   Norrine Sharper, MD  ziprasidone  (GEODON ) 80 MG capsule Take 1 capsule (80 mg total) by mouth 2 (two) times daily. 09/16/23   Kandis Perkins, DO    Allergies: Patient has no known allergies.    Review of Systems  Musculoskeletal:   Positive for arthralgias.  All other systems reviewed and are negative.   Updated Vital Signs BP 114/78   Pulse 85   Temp 99.7 F (37.6 C) (Oral)   Resp 12   Ht 5' 10 (1.778 m)   Wt 95.3 kg   SpO2 97%   BMI 30.13 kg/m   Physical Exam Vitals and nursing note reviewed.   Gen: NAD Eyes: PERRL, EOMI HEENT: no oropharyngeal swelling Neck: trachea midline Resp: clear to auscultation bilaterally Card: RRR, no murmurs, rubs, or gallops Abd: nontender, nondistended Extremities: no calf tenderness, no edema MSK: The patient is tender over the mid and lower lumbar spine with no overlying erythema or obvious deformities noted, the patient tender over the left hip with some pain with active and passive range of motion of the left hip, the patient does have full range of motion of the other extremities and no overlying erythema noted Vascular: 2+ radial pulses bilaterally, 2+ DP pulses bilaterally Skin: no rashes Psyc: acting appropriately   (all labs ordered are listed, but only abnormal results are displayed) Labs Reviewed  COMPREHENSIVE METABOLIC PANEL WITH GFR - Abnormal; Notable for the following components:      Result Value   Glucose, Bld 124 (*)    Creatinine, Ser 2.18 (*)    Albumin  3.0 (*)    GFR, Estimated 36 (*)    All other components within normal limits  CBC WITH DIFFERENTIAL/PLATELET -  Abnormal; Notable for the following components:   WBC 13.1 (*)    RBC 4.01 (*)    Hemoglobin 11.1 (*)    HCT 34.4 (*)    Neutro Abs 10.1 (*)    All other components within normal limits  URINALYSIS, W/ REFLEX TO CULTURE (INFECTION SUSPECTED) - Abnormal; Notable for the following components:   Color, Urine STRAW (*)    Protein, ur 100 (*)    All other components within normal limits  SEDIMENTATION RATE - Abnormal; Notable for the following components:   Sed Rate 86 (*)    All other components within normal limits  C-REACTIVE PROTEIN - Abnormal; Notable for the following  components:   CRP 6.3 (*)    All other components within normal limits  I-STAT CG4 LACTIC ACID, ED - Abnormal; Notable for the following components:   Lactic Acid, Venous 2.0 (*)    All other components within normal limits  CULTURE, BLOOD (ROUTINE X 2)  CULTURE, BLOOD (ROUTINE X 2)  PROTIME-INR  I-STAT CG4 LACTIC ACID, ED    EKG: EKG Interpretation Date/Time:  Saturday December 27 2023 18:06:30 EST Ventricular Rate:  82 PR Interval:  128 QRS Duration:  98 QT Interval:  397 QTC Calculation: 464 R Axis:   -12  Text Interpretation: Sinus rhythm Abnormal R-wave progression, early transition Inferior infarct, old Confirmed by Ula Barter [id number]) on 12/27/2023 6:08:43 PM  Radiology: CT PELVIS WO CONTRAST Result Date: 12/27/2023 EXAM: CT PELVIS, WITHOUT IV CONTRAST 12/27/2023 09:10:00 PM TECHNIQUE: Axial images were acquired through the pelvis without IV contrast. Reformatted images were reviewed. Automated exposure control, iterative reconstruction, and/or weight based adjustment of the mA/kV was utilized to reduce the radiation dose to as low as reasonably achievable. COMPARISON: CT pelvis 10/23/2020. CLINICAL HISTORY: Soft tissue infection suspected, pelvis, xray done; L hip pain, hx of septic arthritis in L SI joint, feels similar. FINDINGS: BONES: Similar-appearing sclerotic lesion of the left iliac bone. No acute fracture. JOINTS: No dislocation. The joint spaces are normal. SOFT TISSUES: Bilateral fat containing inguinal hernia. INTRAPELVIC CONTENTS: Limited images of the intrapelvic contents demonstrate no acute abnormality. IMPRESSION: 1. No acute osseous abnormality. 2. Bilateral fat containing inguinal hernias. Electronically signed by: Morgane Naveau MD 12/27/2023 09:23 PM EST RP Workstation: [id number]   DG Chest Port 1 View Result Date: 12/27/2023 EXAM: 1 VIEW(S) XRAY OF THE CHEST 12/27/2023 06:23:00 PM COMPARISON: 10/24/2023 CLINICAL HISTORY: Questionable sepsis - evaluate for  abnormality FINDINGS: LUNGS AND PLEURA: No focal pulmonary opacity. No pleural effusion. No pneumothorax. HEART AND MEDIASTINUM: No acute abnormality of the cardiac and mediastinal silhouettes. BONES AND SOFT TISSUES: No acute osseous abnormality. IMPRESSION: 1. No acute cardiopulmonary abnormality identified. Electronically signed by: Franky Crease MD 12/27/2023 06:25 PM EST RP Workstation: [id number]     Procedures   Medications Ordered in the ED  morphine  (PF) 4 MG/ML injection 4 mg (4 mg Intravenous Given 12/27/23 1808)  ondansetron  (ZOFRAN ) injection 4 mg (4 mg Intravenous Given 12/27/23 1807)  diazepam  (VALIUM ) injection 5 mg (5 mg Intravenous Given 12/27/23 2223)  gadobutrol  (GADAVIST ) 1 MMOL/ML injection 10 mL (10 mLs Intravenous Contrast Given 12/27/23 2307)                                    Medical Decision Making 48 year old male with past medical history of hypertension, hyperlipidemia and questionable septic arthritis looking back through his most recent note from internal medicine  presenting to the emergency department today with complaints of worsening left hip pain.  The patient's initial temperature on arrival was 100.5.  He is reporting some myalgias in all of his extremities so question whether this may be due to viral etiology such as the flu.  Will obtain sepsis labs here on the patient as well as an ESR and CRP although suspicion for septic arthritis is relatively low.  Looking back through his records it does appear that he has presented multiple times for pain in the left hip.  Will give the patient pain medication here and reevaluate.  Will have our staff repeat his temperature as he is not tachycardic here and denies any history of fevers this initial elevated temperature is of undetermined significance.  With his questionable history of septic arthritis he may require admission and orthopedic evaluation for the hip if this is a possibility but this seems to be more of a chronic  issue.  I will reevaluate for ultimate disposition.  The does have elevated leukocyte count and ESR/CRP.  CT is negative.  MRIs are ordered.  With the patient's elevated inflammatory markers and leukocytosis plan is for possible observation admission to await cultures even if his workup is negative plus or minus antibiotics after speaking with the internal medicine service.  If the patient is feeling back to normal and is afebrile and would like to go home with outpatient follow-up I think this to be reasonable as well.  Amount and/or Complexity of Data Reviewed Labs: ordered. Radiology: ordered.  Risk Prescription drug management.        Final diagnoses:  Fever, unspecified fever cause  Left hip pain    ED Discharge Orders     None          Ula Prentice SAUNDERS, MD 12/27/23 2324

## 2023-12-27 NOTE — ED Provider Notes (Signed)
 Provider Note MRN:  [id number]  Arrival date & time: 12/28/23    ED Course and Medical Decision Making  Assumed care of patient at sign-out or upon transfer.  Patient presenting with fever and pain in the left hip and left lower back/SI joint region.  Has a history of SI joint septic arthritis.  This feels similar.  Having some difficulty walking.  Has inflammatory marker elevation as well, waiting on MRI of the hip which likely will not be read until the morning.  Requesting admission by internal medicine service.  2 AM update: Patient evaluated by internal medicine, with further exam they have noticed pain tenderness and some swelling to the right ankle.  Patient does have a history of gout so this could be gout but given the fever and inflammatory markers internal medicine requesting arthrocentesis for synovial fluid analysis.  See procedural details below.  .Joint Aspiration/Arthrocentesis  Date/Time: 12/28/2023 2:15 AM  Performed by: Theadore Ozell HERO, MD Authorized by: Theadore Ozell HERO, MD   Consent:    Consent obtained:  Verbal   Consent given by:  Patient and parent   Risks, benefits, and alternatives were discussed: yes     Risks discussed:  Bleeding, incomplete drainage, nerve damage, infection and pain Universal protocol:    Procedure explained and questions answered to patient or proxy's satisfaction: yes     Immediately prior to procedure, a time out was called: yes     Patient identity confirmed:  Verbally with patient and arm band Location:    Location:  Ankle   Ankle:  R ankle Anesthesia:    Anesthesia method:  Local infiltration   Local anesthetic:  Lidocaine  1% w/o epi Procedure details:    Preparation: Patient was prepped and draped in usual sterile fashion     Needle gauge:  18 G   Ultrasound guidance: yes     Approach:  Anterior   Aspirate amount:  3cc   Aspirate characteristics:  Cloudy and yellow   Steroid injected: no     Specimen collected: yes    Post-procedure details:    Dressing:  Adhesive bandage   Procedure completion:  Tolerated well, no immediate complications Comments:     Given the small size of the joint effusion, arthrocentesis was performed with real-time ultrasound.  Full sterility was followed including ultrasound probe cover. SABRAUltrasound ED Soft Tissue  Date/Time: 12/28/2023 2:25 AM  Performed by: Theadore Ozell HERO, MD Authorized by: Theadore Ozell HERO, MD   Procedure details:    Indications: limb pain     Transverse view:  Visualized   Longitudinal view:  Visualized   Images: archived   Location:    Location comment:  Ankle   Side:  Right Comments:     MSK ultrasound performed to evaluate for evidence of joint effusion.  Small joint effusion noted.   Final Clinical Impressions(s) / ED Diagnoses     ICD-10-CM   1. Fever, unspecified fever cause  R50.9     2. Left hip pain  M25.552     3. Acute right ankle pain  M25.571       ED Discharge Orders     None       Discharge Instructions   None     Ozell HERO. Theadore, MD [facility] Emergency Medicine [facility] [email or web address]    Theadore Ozell HERO, MD 12/27/23 2352    Theadore Ozell HERO, MD 12/28/23 0216    Theadore Ozell HERO, MD 12/28/23 [phone]

## 2023-12-27 NOTE — H&P (Addendum)
 Date: 12/28/2023               Patient Name:  Kyle Montgomery MRN: [id number]  DOB: 1975/12/10 Age / Sex: 48 y.o., male   PCP: Norrine Sharper, MD         Medical Service: Internal Medicine Teaching Service         Attending Physician: Dr. 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 Patient has been followed for this as an outpatient with [facility]  Last office visit was on 11/13 last year.  Patient does have a history of left pelvis fracture in 2022 that subsequently developed septic arthritis.  Patient's mother stated that about 2 months ago she did not have a car and patient and her were walking long distances that aggravated this issue.  He has been using a cane which he holds on his right hand.  Mother states that gait becomes slower and patient was barely able to ambulate.  At last office visit hydrocodone /acetaminophen  5-325 mg given every 6 hours as needed for about 5 days for moderate pain.  Per mother these pain medications did not help.  Patient was also given lidocaine  patches.  X-ray of bilateral hips and pelvis was ordered and showed no acute abnormality.  Did show sclerosis in the left iliac bone consistent with healing fracture.  Per patient chronic left hip pain worsened this morning.  No triggers.  Patient was not sure if this pain gets any worse with cold weather or if it gets better throughout the day.  Patient denies any fevers or chills at home so does that aware that he did have a fever today on arrival.  He denies any nausea or vomiting.  He is  states that he feels like this is similar to when he had septic arthritis.  Patient is also complaining of right ankle pain.  He states that it is worse on the right lateral malleolus or side.  Patient does have a history of gout and is left ankle but is unsure if this is the same pain as the right ankle pain.  He repeatedly states that he has sprained his right ankle but is unsure of any mechanism of injury that caused this.  Patient also reports chronic left knee pain that has been the same on and off.  As far as the left sided back pain patient denies any bladder or bowel incontinence, saddle anesthesia.  He denies any recent trauma or falls.  He has been trying to rest more recently.  Patient did state the director evaluation that morphine  helped him.   Review of Systems: A complete ROS was negative except as per HPI.   ED Course: Temperature of 100.5, RR 18, Pulse 99, 126/95, 98% on room air  Cr 2.18, WBC 13.1, Hgb of 11.1 \ Urinalysis clear of infection ESR 86 and CRP 6.3  Lactic acid 2 to .  9  Received 4 mg morphine , zofran  4 mg, valium  5 mg   Past Medical History: Septic arthritis of the Left SI joint in 2022 T2DM HTN CKD stage 3B Bipolar disorder  Meds:  Amlodipine  10 mg daily Lipitor 40 mg  Depakote  500 mg nightly Lisinopril -hydrochlorothiazide  20-12.5 mg 2 capsules daily Geodon  80 mg BID   Allergies: Allergies as of 12/27/2023   (No Known Allergies)    Past Surgical History:  Procedure Laterality Date   BOWEL DECOMPRESSION N/A 10/25/2020   Procedure: BOWEL DECOMPRESSION;  Surgeon: Saintclair Jasper, MD;  Location: [facility];  Service: Gastroenterology;  Laterality: N/A;   COLONOSCOPY N/A 10/25/2020   Procedure: COLONOSCOPY;  Surgeon: Saintclair Jasper, MD;  Location: [facility];  Service: Gastroenterology;  Laterality: N/A;   DENTAL SURGERY      Social:  Lives With: Mom and stepfather Occupation:On disability  Support: Cane  Level of Function:Used to be independent  iADLs, independent ADLs ERE:FrOzwinw, Michael, MD  Substances: -Tobacco: Remote history -Alcohol: Remote history  -Drugs:None   Family History:  Family History  Problem Relation Age of Onset   Hyperlipidemia Mother    Hypertension Mother    Atrial fibrillation Mother    Hypertension Brother    Hypertension Maternal Grandmother    Cancer Maternal Grandmother       OBJECTIVE:   Physical Exam: Blood pressure 114/78, pulse 85, temperature 99.7 F (37.6 C), temperature source Oral, resp. rate 12, height 5' 10 (1.778 m), weight 95.3 kg, SpO2 97%.  Constitutional: well-appearing male sitting in bed, in no acute distress HENT: normocephalic atraumatic, mucous membranes moist Cardiovascular: regular rate and rhythm, no m/r/g Pulmonary/Chest: normal work of breathing on room air, lungs clear to auscultation bilaterally Abdominal: soft, non-tender, non-distended. Neurological: alert & oriented x 3 MSK: no gross abnormalities. +1 pitting edema bilaterally. Tenderness to touch over right malleolus and mildly elevated temperature difference. Pain with dorsiflexion and plantarflexion on the right foot with passive range of motion. Intact range of motion as far as internal and external rotation of left hip. No pain with these movements.  No thoracic or lumbar midline tenderness. No paraspinal tenderness. No pain down IT band.  Skin: warm and dry, no rash noted  Psych: Normal mood and affect  Labs:    Latest Ref Rng & Units 12/27/2023    5:43 PM 10/24/2023   12:40 AM 09/02/2023    2:59 PM  CBC  WBC 4.0 - 10.5 K/uL 13.1  10.1  8.8   Hemoglobin 13.0 - 17.0 g/dL 88.8  88.3  87.0   Hematocrit 39.0 - 52.0 % 34.4  35.8  39.4   Platelets 150 - 400 K/uL 349  302  291         Latest Ref Rng & Units 12/27/2023    5:43 PM 10/24/2023   12:40 AM 08/04/2023   11:02 AM  CMP  Glucose 70 - 99 mg/dL 875  865  892   BUN 6 - 20 mg/dL 17  42  31   Creatinine 0.61 - 1.24 mg/dL 7.81  7.49  7.68   Sodium  135 - 145 mmol/L 144  140  143   Potassium 3.5 - 5.1 mmol/L 4.0  3.2  3.6   Chloride 98 - 111 mmol/L 105  104  103   CO2 22 - 32 mmol/L 25  24  21    Calcium  8.9 - 10.3 mg/dL 9.5  8.8  9.4   Total Protein 6.5 - 8.1 g/dL 7.5  7.1  Total Bilirubin 0.0 - 1.2 mg/dL 0.5  0.6    Alkaline Phos 38 - 126 U/L 48  59    AST 15 - 41 U/L 23  28    ALT 0 - 44 U/L 27  28        Imaging: MR Lumbar Spine W Wo Contrast Result Date: 12/27/2023 EXAM: MRI LUMBAR SPINE 12/27/2023 11:17:10 PM TECHNIQUE: Multiplanar multisequence MRI of the lumbar spine was performed without or with the administration of intravenous contrast. COMPARISON: None available. CONTRAST: 10mL Gadavist  CLINICAL HISTORY: Low back pain, infection suspected, positive xray/CT FINDINGS: BONES AND ALIGNMENT: Normal alignment. Normal vertebral body heights. Mild degenerative endplate signal changes anteriorly at L3 and L4. No specific evidence of discitis. No abnormal enhancement. SPINAL CORD: The conus terminates normally. No abnormal enhancement. SOFT TISSUES: Renal cysts. L1-L2: No significant disc herniation. No spinal canal stenosis or neural foraminal narrowing. L2-L3: No significant disc herniation. No spinal canal stenosis or neural foraminal narrowing. L3-L4: No significant disc herniation. No spinal canal stenosis or neural foraminal narrowing. L4-L5: No significant disc herniation. No spinal canal stenosis or neural foraminal narrowing. L5-S1: No significant disc herniation. No spinal canal stenosis or neural foraminal narrowing. IMPRESSION: 1. No evidence of acute abnormality or significant stenosis. Electronically signed by: Gilmore Molt MD 12/27/2023 11:24 PM EST RP Workstation: [id number]   CT PELVIS WO CONTRAST Result Date: 12/27/2023 EXAM: CT PELVIS, WITHOUT IV CONTRAST 12/27/2023 09:10:00 PM TECHNIQUE: Axial images were acquired through the pelvis without IV contrast. Reformatted images were reviewed. Automated exposure control,  iterative reconstruction, and/or weight based adjustment of the mA/kV was utilized to reduce the radiation dose to as low as reasonably achievable. COMPARISON: CT pelvis 10/23/2020. CLINICAL HISTORY: Soft tissue infection suspected, pelvis, xray done; L hip pain, hx of septic arthritis in L SI joint, feels similar. FINDINGS: BONES: Similar-appearing sclerotic lesion of the left iliac bone. No acute fracture. JOINTS: No dislocation. The joint spaces are normal. SOFT TISSUES: Bilateral fat containing inguinal hernia. INTRAPELVIC CONTENTS: Limited images of the intrapelvic contents demonstrate no acute abnormality. IMPRESSION: 1. No acute osseous abnormality. 2. Bilateral fat containing inguinal hernias. Electronically signed by: Morgane Naveau MD 12/27/2023 09:23 PM EST RP Workstation: [id number]   DG Chest Port 1 View Result Date: 12/27/2023 EXAM: 1 VIEW(S) XRAY OF THE CHEST 12/27/2023 06:23:00 PM COMPARISON: 10/24/2023 CLINICAL HISTORY: Questionable sepsis - evaluate for abnormality FINDINGS: LUNGS AND PLEURA: No focal pulmonary opacity. No pleural effusion. No pneumothorax. HEART AND MEDIASTINUM: No acute abnormality of the cardiac and mediastinal silhouettes. BONES AND SOFT TISSUES: No acute osseous abnormality. IMPRESSION: 1. No acute cardiopulmonary abnormality identified. Electronically signed by: Franky Crease MD 12/27/2023 06:25 PM EST RP Workstation: [id number]      EKG: personally reviewed my interpretation is sinus rhythm .   ASSESSMENT & PLAN:   Assessment & Plan by Problem: Principal Problem:   Right ankle pain Active Problems:   Bipolar affective (HCC)   Essential hypertension   CKD (chronic kidney disease), stage III (HCC)   Hyperlipidemia   Type 2 diabetes mellitus treated without insulin  (HCC)   Left hip pain   SIRS (systemic inflammatory response syndrome) (HCC)   Leukocytosis   Hypoalbuminemia   Kyle Montgomery is a 48 year old male with a past medical history of septic  arthritis of the Left SI joint in 2022, T2DM, HTN, CKD stage 3B, bipolar disorder who presents to the ED for Left hip pain that acutely worsened this morning and is being admitted for  right ankle pain in the setting of SIRS   Right ankle pain  Patient's temperature was elevated on arrival to the ED at 100.5. Patient denied any fevers, chills at home, or sick contacts. He has had gout flare in his left ankle before but does not remember what that felt like. On physical exam, mild temperature difference between ankles with right being slightly warmer than left. Patient also has leukocytosis and elevated sed rate that is greater than his baseline. No known mechanism of trauma although patient stated that he sprained it.  Higher on the differential is gout with patient's history over septic joint in this area. ED provider was able to get fluid from this joint and will send off for fluid analysis. No open wounds noted in the area. Xray imaging did not show any acute fracture  - oxycodone  5 mg IR q6 prn and voltaren  gel for pain  - fluid studies showed WBC of 42K and monosodium urate crystals. These crystals are consistent with gout  -started colchicine  1.2 mg once and . 6 mg one hour later  - IV ceftriaxone  and vancomycin  started   Left hip pain - no point tenderness to midline spine, lower back or hip area. Intact range of motion of left hip with no pain with internal or external rotation. As noted before, was being followed in clinic with worsening acutely today. Patient does have history of septic arthritis after pelvis fracture. CT pelvis showed sclerotic lesion of the left iliac bone; no acute osseous abnormalities. MR of the lumbar spine didn't show any acute abnormality.  results of MRI showed findings consistent with left sacroiliitis with joint widening, subchondral marrow edema, small intra articular fluid, Cortical erosions at the SI joint. Left sacroiliitis.  - at this time will start broad  spectrum abx with IV vanc and ceftriaxone   -consult IR to try to drain fluid and rule out septic joint  -PT/OT  SIRS Leukocytosis  Lactic acidosis, resolved  Patient came in with fever to 100.5. CXR clear for any signs of infection. UA also does not show any signs of infection. ESR elevated above normal baseline patient had from even 3 years ago when he had septic arthritis. With concern for right ankle gout, this could lead to SIRS response. Lower differential for autoimmune causes although patient does multiple joint pains but these seem more consistent with arthritis. Cannot rule out septic joint with previous history. WBC is elevated and lactic acidosis was originally present but cleared. Temperature has also normalized on its own.CT of pelvis was negative for acute abnormality.  - started broad spectrum IV abx rocephin  and vancomycin    - follow blood cultures.  - trend WBC count - follow fever curve   HTN No acute concerns at this time. Continue to monitor . Consider d/c HCTZ  T2DM Last A1c was 6.5 in July   Being managed with Diet and lifestyle control at this time   Bipolar disorder Continue depakote  500 mg daily  and geodon  80 mg BID   CKD stage 3B  No acute concerns at this time. Continue to monitor. Cr stable at 2.18   Diet: NPO VTE: Enoxaparin  Code: DNR/DNI  Prior to Admission Living Arrangement: Home, living with mom and stepdad Anticipated Discharge Location: Home Barriers to Discharge: pending further clinical workup   Dispo: Admit patient to Observation with expected length of stay less than 2 midnights.  Signed:  Denario Bagot D'Mello, DO Internal Medicine Resident PGY-1 12/28/2023, 2:58 AM   Please contact  the on call pager at [phone]

## 2023-12-27 NOTE — ED Triage Notes (Signed)
 Pt fell 3 years ago and broke pelvis and states having a lot of pain. Pt states loss of appetite. Denies bowel/bladder issues.

## 2023-12-27 NOTE — ED Notes (Signed)
 Patient transported to MRI

## 2023-12-27 NOTE — ED Notes (Signed)
 Patient transported to CT

## 2023-12-28 ENCOUNTER — Observation Stay (HOSPITAL_COMMUNITY)

## 2023-12-28 DIAGNOSIS — R509 Fever, unspecified: Secondary | ICD-10-CM

## 2023-12-28 DIAGNOSIS — E8809 Other disorders of plasma-protein metabolism, not elsewhere classified: Secondary | ICD-10-CM | POA: Insufficient documentation

## 2023-12-28 DIAGNOSIS — M7989 Other specified soft tissue disorders: Secondary | ICD-10-CM | POA: Diagnosis not present

## 2023-12-28 DIAGNOSIS — M25571 Pain in right ankle and joints of right foot: Secondary | ICD-10-CM | POA: Diagnosis not present

## 2023-12-28 DIAGNOSIS — D72829 Elevated white blood cell count, unspecified: Secondary | ICD-10-CM

## 2023-12-28 DIAGNOSIS — M10371 Gout due to renal impairment, right ankle and foot: Secondary | ICD-10-CM

## 2023-12-28 LAB — SYNOVIAL CELL COUNT + DIFF, W/ CRYSTALS
Eosinophils-Synovial: 0 % (ref 0–1)
Lymphocytes-Synovial Fld: 1 % (ref 0–20)
Monocyte-Macrophage-Synovial Fluid: 4 % — ABNORMAL LOW (ref 50–90)
Neutrophil, Synovial: 95 % — ABNORMAL HIGH (ref 0–25)
WBC, Synovial: 42175 /mm3 — ABNORMAL HIGH (ref 0–200)

## 2023-12-28 LAB — CBC
HCT: 32.2 % — ABNORMAL LOW (ref 39.0–52.0)
Hemoglobin: 10.2 g/dL — ABNORMAL LOW (ref 13.0–17.0)
MCH: 27.3 pg (ref 26.0–34.0)
MCHC: 31.7 g/dL (ref 30.0–36.0)
MCV: 86.3 fL (ref 80.0–100.0)
Platelets: 304 K/uL (ref 150–400)
RBC: 3.73 MIL/uL — ABNORMAL LOW (ref 4.22–5.81)
RDW: 14.2 % (ref 11.5–15.5)
WBC: 8.7 K/uL (ref 4.0–10.5)
nRBC: 0 % (ref 0.0–0.2)

## 2023-12-28 LAB — BASIC METABOLIC PANEL WITH GFR
Anion gap: 11 (ref 5–15)
BUN: 16 mg/dL (ref 6–20)
CO2: 29 mmol/L (ref 22–32)
Calcium: 9.1 mg/dL (ref 8.9–10.3)
Chloride: 105 mmol/L (ref 98–111)
Creatinine, Ser: 2.05 mg/dL — ABNORMAL HIGH (ref 0.61–1.24)
GFR, Estimated: 39 mL/min — ABNORMAL LOW (ref >60)
Glucose, Bld: 109 mg/dL — ABNORMAL HIGH (ref 70–99)
Potassium: 4 mmol/L (ref 3.5–5.1)
Sodium: 145 mmol/L (ref 135–145)

## 2023-12-28 LAB — HIV ANTIBODY (ROUTINE TESTING W REFLEX): HIV Screen 4th Generation wRfx: NONREACTIVE

## 2023-12-28 MED ORDER — OXYCODONE HCL 5 MG PO TABS: 5.0000 mg | ORAL_TABLET | Freq: Four times a day (QID) | ORAL | Status: DC | PRN

## 2023-12-28 MED ORDER — POLYETHYLENE GLYCOL 3350 17 G PO PACK: 17.0000 g | PACK | Freq: Every day | ORAL | 0 refills | Status: AC | PRN

## 2023-12-28 MED ORDER — VANCOMYCIN HCL IN DEXTROSE 1-5 GM/200ML-% IV SOLN: 1000.0000 mg | INTRAVENOUS | Status: DC

## 2023-12-28 MED ORDER — ENOXAPARIN SODIUM 40 MG/0.4ML IJ SOSY
40.0000 mg | PREFILLED_SYRINGE | INTRAMUSCULAR | Status: DC
  Administered 2023-12-28: 40 mg via SUBCUTANEOUS
  Filled 2023-12-28: qty 0.4

## 2023-12-28 MED ORDER — DICLOFENAC SODIUM 1 % EX GEL
2.0000 g | Freq: Four times a day (QID) | CUTANEOUS | Status: DC
  Filled 2023-12-28: qty 100

## 2023-12-28 MED ORDER — COLCHICINE 0.6 MG PO TABS
0.6000 mg | ORAL_TABLET | Freq: Once | ORAL | Status: AC
  Administered 2023-12-28: 0.6 mg via ORAL
  Filled 2023-12-28 (×2): qty 1

## 2023-12-28 MED ORDER — ZIPRASIDONE HCL 40 MG PO CAPS
80.0000 mg | ORAL_CAPSULE | Freq: Two times a day (BID) | ORAL | Status: DC
  Administered 2023-12-28 (×2): 80 mg via ORAL
  Filled 2023-12-28 (×2): qty 2
  Filled 2023-12-28: qty 1

## 2023-12-28 MED ORDER — ATORVASTATIN CALCIUM 40 MG PO TABS
40.0000 mg | ORAL_TABLET | Freq: Every day | ORAL | Status: DC
  Administered 2023-12-28: 40 mg via ORAL
  Filled 2023-12-28: qty 1

## 2023-12-28 MED ORDER — COLCHICINE 0.6 MG PO TABS
1.2000 mg | ORAL_TABLET | Freq: Once | ORAL | Status: AC
  Administered 2023-12-28: 1.2 mg via ORAL
  Filled 2023-12-28: qty 2

## 2023-12-28 MED ORDER — LISINOPRIL 40 MG PO TABS: 40.0000 mg | ORAL_TABLET | Freq: Every day | ORAL | 0 refills | Status: DC

## 2023-12-28 MED ORDER — COLCHICINE 0.6 MG PO TABS: 0.6000 mg | ORAL_TABLET | Freq: Every day | ORAL | 0 refills | Status: DC

## 2023-12-28 MED ORDER — POLYETHYLENE GLYCOL 3350 17 G PO PACK: 17.0000 g | PACK | Freq: Every day | ORAL | Status: DC | PRN

## 2023-12-28 MED ORDER — SODIUM CHLORIDE 0.9 % IV SOLN
2.0000 g | INTRAVENOUS | Status: DC
  Administered 2023-12-28: 2 g via INTRAVENOUS
  Filled 2023-12-28: qty 20

## 2023-12-28 MED ORDER — SODIUM CHLORIDE 0.9 % IV SOLN: 2.0000 g | INTRAVENOUS | Status: DC

## 2023-12-28 MED ORDER — DIVALPROEX SODIUM ER 500 MG PO TB24
500.0000 mg | ORAL_TABLET | Freq: Every day | ORAL | Status: DC
  Administered 2023-12-28: 500 mg via ORAL
  Filled 2023-12-28: qty 1

## 2023-12-28 MED ORDER — LIDOCAINE HCL 2 % IJ SOLN
INTRAMUSCULAR | Status: DC
  Filled 2023-12-28: qty 20

## 2023-12-28 MED ORDER — VANCOMYCIN HCL 2000 MG/400ML IV SOLN
2000.0000 mg | Freq: Once | INTRAVENOUS | Status: AC
  Administered 2023-12-28: 2000 mg via INTRAVENOUS
  Filled 2023-12-28: qty 400

## 2023-12-28 MED ORDER — COLCHICINE 0.6 MG PO TABS: 0.6000 mg | ORAL_TABLET | Freq: Every day | ORAL | Status: DC

## 2023-12-28 MED ORDER — ACETAMINOPHEN 500 MG PO TABS
500.0000 mg | ORAL_TABLET | Freq: Four times a day (QID) | ORAL | Status: DC
  Administered 2023-12-28: 500 mg via ORAL
  Filled 2023-12-28: qty 1

## 2023-12-28 NOTE — Hospital Course (Addendum)
 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 Workup was to assess his polyarthropathy. The painful joints were the L hip and R ankle.   Joint aspiration of the R ankle was consistent with gout. Initial treatment was for a gout flare, colchicine .   Empiric antibiotics (Vancomycin  and ceftriaxone ) were added in the early morning of 12/7 per MRI results showing nonspecific sacroiliitis of the L hip and plans were made for aspiration. He had septic arthritis in this hip in the past but has no recent trauma or superficial infection.  However, the patient demonstrated remarkable improvement in his pain, with immediate resolution of his fevers without antipyretics and resolution of leukocytosis. The speed of his recovery that preceeded the addition of antibiotics strongly suggested that further workup of a septic joint was no longer necessary.  He is discharged 12/7 with a short course of colchicine , hydrochlorothiazide  is stopped, and return precautions are provided. He will follow up in the internal medicine clinic shortly.

## 2023-12-28 NOTE — Discharge Instructions (Signed)
 Kyle Montgomery,  We are glad to see that you are doing well. You had a gout flare.  Continue to take a new medicine called colchicine . This is for pain and you take it once daily. Your last dose will be on the day after your pain resolves. If the gout pain ends today, your last dose will be tomorrow. If the gout pain ends on Monday, your last dose will be Tuesday. I have dispensed 7 pills if you need them.  Stop the medicine called zestoretic  or lisinopril -hydrochlorothiazide . This is because hydrochlorothiazide  can cause gout flares. As an alternative, I will provide lisinopril  for you to take for blood pressure.  Medicines are sent to [facility] on [address].  You can continue all other current medicines.  Expect a phone call from our clinic to set up an appointment. IF you do not hear from them tomorrow afternoon, please call them to set up an appointment. The number is [phone].  If you have return of severe pain that is not relieved by the above medicines or if you have fevers above 100.4 degrees that last for a few hours or if you start to feel severely weak, tired, or sick, please call our clinic return to the ER.

## 2023-12-28 NOTE — Plan of Care (Signed)
  Problem: Education: Goal: Knowledge of General Education information will improve Description: Including pain rating scale, medication(s)/side effects and non-pharmacologic comfort measures 12/28/2023 0944 by Burnard Almarie BROCKS, RN Outcome: Adequate for Discharge 12/28/2023 0736 by Burnard Almarie BROCKS, RN Outcome: Progressing   Problem: Health Behavior/Discharge Planning: Goal: Ability to manage health-related needs will improve 12/28/2023 0944 by Burnard Almarie BROCKS, RN Outcome: Adequate for Discharge 12/28/2023 0736 by Burnard Almarie BROCKS, RN Outcome: Progressing   Problem: Clinical Measurements: Goal: Ability to maintain clinical measurements within normal limits will improve 12/28/2023 0944 by Burnard Almarie BROCKS, RN Outcome: Adequate for Discharge 12/28/2023 0736 by Burnard Almarie BROCKS, RN Outcome: Progressing Goal: Will remain free from infection 12/28/2023 0944 by Burnard Almarie BROCKS, RN Outcome: Adequate for Discharge 12/28/2023 0736 by Burnard Almarie BROCKS, RN Outcome: Progressing Goal: Diagnostic test results will improve 12/28/2023 0944 by Burnard Almarie BROCKS, RN Outcome: Adequate for Discharge 12/28/2023 0736 by Burnard Almarie BROCKS, RN Outcome: Progressing Goal: Respiratory complications will improve 12/28/2023 0944 by Burnard Almarie BROCKS, RN Outcome: Adequate for Discharge 12/28/2023 0736 by Burnard Almarie BROCKS, RN Outcome: Progressing Goal: Cardiovascular complication will be avoided 12/28/2023 0944 by Burnard Almarie BROCKS, RN Outcome: Adequate for Discharge 12/28/2023 0736 by Burnard Almarie BROCKS, RN Outcome: Progressing   Problem: Activity: Goal: Risk for activity intolerance will decrease 12/28/2023 0944 by Burnard Almarie BROCKS, RN Outcome: Adequate for Discharge 12/28/2023 0736 by Burnard Almarie BROCKS, RN Outcome: Progressing   Problem: Nutrition: Goal: Adequate nutrition will be maintained 12/28/2023 0944 by Burnard Almarie BROCKS, RN Outcome: Adequate for Discharge 12/28/2023 0736 by  Burnard Almarie BROCKS, RN Outcome: Progressing   Problem: Coping: Goal: Level of anxiety will decrease 12/28/2023 0944 by Burnard Almarie BROCKS, RN Outcome: Adequate for Discharge 12/28/2023 0736 by Burnard Almarie BROCKS, RN Outcome: Progressing   Problem: Elimination: Goal: Will not experience complications related to bowel motility 12/28/2023 0944 by Burnard Almarie BROCKS, RN Outcome: Adequate for Discharge 12/28/2023 0736 by Burnard Almarie BROCKS, RN Outcome: Progressing Goal: Will not experience complications related to urinary retention 12/28/2023 0944 by Burnard Almarie BROCKS, RN Outcome: Adequate for Discharge 12/28/2023 0736 by Burnard Almarie BROCKS, RN Outcome: Progressing   Problem: Pain Managment: Goal: General experience of comfort will improve and/or be controlled 12/28/2023 0944 by Burnard Almarie BROCKS, RN Outcome: Adequate for Discharge 12/28/2023 0736 by Burnard Almarie BROCKS, RN Outcome: Progressing   Problem: Safety: Goal: Ability to remain free from injury will improve 12/28/2023 0944 by Burnard Almarie BROCKS, RN Outcome: Adequate for Discharge 12/28/2023 0736 by Burnard Almarie BROCKS, RN Outcome: Progressing   Problem: Skin Integrity: Goal: Risk for impaired skin integrity will decrease 12/28/2023 0944 by Burnard Almarie BROCKS, RN Outcome: Adequate for Discharge 12/28/2023 0736 by Burnard Almarie BROCKS, RN Outcome: Progressing

## 2023-12-28 NOTE — Progress Notes (Signed)
 Patient arrived to floor around 0300 am.  IV antibiotics ordered. Patient awake, alert, oriented x4.  Wife at bedside. Patient acclimated to room without any problems noted.  Side rails up x2. Patient has cane for assist to bathroom but was instructed to call for assistance if he needed to get up. Urinal given at patient request.  Patient is NPO at present and is asking when he can get food.  Patient instructed that would be up to physician who sees him in the am and patient was agreeable to this.    Call light in reach, respirations even and unlabored. Bed low position

## 2023-12-28 NOTE — Progress Notes (Signed)
 Pharmacy Antibiotic Note  Kyle Montgomery is a 48 y.o. male admitted on 12/27/2023 with left hip/back SI joint pain.  Pharmacy has been consulted for vancomycin  dosing septic arthritis. 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 (bcx grew staph saprophyticus)  -WBC 13, sCr 2.18 (~bl), Tmax 100.5 -Blood cultures/ synovial fluid of right ankle collected   Plan: -Ceftriaxone  2g IV every 24 hours -Vancomycin  2g IV x1 -Vancomycin  1000mg  IV every 24 hours (AUC 456, IBW, Vd 0.5 (BMI=30), sCr 2.18) -Monitor renal function -Follow up signs of clinical improvement, LOT, de-escalation of antibiotics   Height: 5' 10 (177.8 cm) Weight: 95.3 kg (210 lb) IBW/kg (Calculated) : 73  Temp (24hrs), Avg:99.6 F (37.6 C), Min:98.6 F (37 C), Max:100.5 F (38.1 C)  Recent Labs  Lab 12/27/23 1743 12/27/23 1807 12/27/23 2014  WBC 13.1*  --   --   CREATININE 2.18*  --   --   LATICACIDVEN  --  2.0* 0.9    Estimated Creatinine Clearance: 48 mL/min (A) (by C-G formula based on SCr of 2.18 mg/dL (H)).    No Known Allergies  Antimicrobials this admission: Ceftriaxone  12/7 >>  Vancomycin  12/7 >>   Microbiology results: 12/6 BCx:  12/7 FluidCx:  Thank you for allowing pharmacy to be a part of this patient's care.  Lynwood Poplar, PharmD, BCPS Clinical Pharmacist 12/28/2023 2:50 AM

## 2023-12-28 NOTE — Plan of Care (Signed)

## 2023-12-28 NOTE — Discharge Summary (Addendum)
 Name: Kyle Montgomery MRN: [id number] DOB: 1975/02/10 48 y.o. PCP: Norrine Sharper, MD  Date of Admission: 12/27/2023  4:34 PM Date of Discharge:  12/28/2023 Attending Physician: Dr. 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 Active Problems:   Bipolar affective (HCC)   Essential hypertension   CKD stage 3b, GFR 30-44 ml/min (HCC)   Hyperlipidemia   Type 2 diabetes mellitus treated without insulin  (HCC)   Left hip pain   Hypoalbuminemia    DISCHARGE MEDICATIONS:   Allergies as of 12/28/2023   No Known Allergies      Medication List     STOP taking these medications    lisinopril -hydrochlorothiazide  20-12.5 MG tablet Commonly known as: Zestoretic        TAKE these medications    amLODipine  10 MG tablet Commonly known as: NORVASC  TAKE 1 TABLET BY MOUTH EVERY DAY   atorvastatin  40 MG tablet Commonly known as: LIPITOR TAKE 1 TABLET BY MOUTH EVERY DAY   colchicine  0.6 MG tablet Take 1 tablet (0.6 mg total) by mouth daily for 7 doses. Stop taking one day after pain has ended. Start taking on: December 29, 2023   diclofenac  Sodium 1 % Gel Commonly known as: VOLTAREN  Apply 2 g topically 4 (four) times daily.   divalproex  500 MG 24 hr tablet Commonly known as: DEPAKOTE  ER Take 1 tablet (500 mg total) by mouth at bedtime.   HYDROcodone -acetaminophen  5-325 MG tablet Commonly known as: NORCO/VICODIN Take 1 tablet by mouth daily as needed for up to 5 days for severe pain (pain score 7-10).   lisinopril  40 MG tablet Commonly known as: ZESTRIL  Take 1 tablet (40 mg total) by mouth daily.   polyethylene glycol 17 g packet Commonly known as: MIRALAX  / GLYCOLAX  Take 17 g by mouth daily as needed for mild constipation.   ziprasidone  80 MG capsule Commonly known as: GEODON  Take 1 capsule (80 mg total) by mouth 2 (two) times daily.         DISPOSITION AND FOLLOW-UP:  Kyle Montgomery was discharged from [facility] in Good condition after diagnosis and treatment of gout flare. At the hospital follow up visit please address:  Follow-up Recommendations:  Please assess for pain in the R ankle and L SI joint. Initial concerns for septic arthritis were relieved in favor of gout flare.  Medications: Colchicine  provided with instructions to finish course one day after his last day of pain. Lisinopril -hydrochlorothiazide  stopped in favor of lisinopril  alone (dispensed) to prevent further flares. At this time, gout prophylaxis therapy is not felt necessary. Changes to his anti-HTN regimen may be needed since hydrochlorothiazide  is stopped.  Follow-up Appointments: Message sent to [facility] clinic to set up appointment. His mother will also call to set up appointment.  Follow-up Information     Norrine Sharper, MD. Call in 1 week(s).   Specialty: Internal Medicine Contact information: [address] 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 Workup was to assess his polyarthropathy. The painful joints were the L hip and R ankle.   Joint aspiration of the R ankle  was consistent with gout. Initial treatment was for a gout flare, colchicine .   Empiric antibiotics (Vancomycin  and ceftriaxone ) were added in the early morning of 12/7 per MRI results showing nonspecific sacroiliitis of the L hip and plans were made for aspiration. He had septic arthritis in this hip in the past but has no recent trauma or superficial infection.  However, the patient demonstrated remarkable improvement in his pain, with immediate resolution of his fevers without antipyretics and  resolution of leukocytosis. The speed of his recovery that preceeded the addition of antibiotics strongly suggested that further workup of a septic joint was no longer necessary.  He is discharged 12/7 with a short course of colchicine , hydrochlorothiazide  is stopped, and return precautions are provided. He will follow up in the internal medicine clinic shortly.   DISCHARGE INSTRUCTIONS:   Discharge Instructions     Call MD for:  difficulty breathing, headache or visual disturbances   Complete by: As directed    Call MD for:  extreme fatigue   Complete by: As directed    Call MD for:  persistant dizziness or light-headedness   Complete by: As directed    Call MD for:  persistant nausea and vomiting   Complete by: As directed    Call MD for:  redness, tenderness, or signs of infection (pain, swelling, redness, odor or green/yellow discharge around incision site)   Complete by: As directed    Call MD for:  severe uncontrolled pain   Complete by: As directed    Call MD for:  temperature >100.4   Complete by: As directed    Diet - low sodium heart healthy   Complete by: As directed    Increase activity slowly   Complete by: As directed        SUBJECTIVE:  He feels very well this morning. He has not complaints. He requests to go home. He would like ginger ale. He has no pain at any location including with palpation of his L hip and R ankle. Discharge Vitals:   BP (!) 127/92 (BP Location: Right Arm)   Pulse 93   Temp 98.2 F (36.8 C) (Oral)   Resp 18   Ht 5' 10 (1.778 m)   Wt 95.3 kg   SpO2 97%   BMI 30.13 kg/m   OBJECTIVE:  Physical Exam Constitutional:      General: He is not in acute distress.    Appearance: Normal appearance. He is not ill-appearing.  Cardiovascular:     Rate and Rhythm: Normal rate and regular rhythm.  Pulmonary:     Effort: Pulmonary effort is normal.     Breath sounds: Normal breath sounds.  Abdominal:     General: Abdomen is flat. There is no  distension.     Palpations: Abdomen is soft.     Tenderness: There is no abdominal tenderness.  Musculoskeletal:     Comments: There is no pain on deep palpation of the L hip and sacral area or with joint movement. There is no pain with palpation of the R ankle. The R ankle exhibits mild warmth vs the L. There is mild non-pitting edema of the bilateral ankles that is slightly worse on the R. There is no superficial sign of infection.  Skin:    General: Skin is warm and dry.  Neurological:     General: No focal deficit present.     Mental Status: He is alert and oriented to person, place, and time.  Psychiatric:  Mood and Affect: Mood normal.        Behavior: Behavior normal.     Pertinent Labs, Studies, and Procedures:     Latest Ref Rng & Units 12/28/2023    5:48 AM 12/27/2023    5:43 PM 10/24/2023   12:40 AM  CBC  WBC 4.0 - 10.5 K/uL 8.7  13.1  10.1   Hemoglobin 13.0 - 17.0 g/dL 89.7  88.8  88.3   Hematocrit 39.0 - 52.0 % 32.2  34.4  35.8   Platelets 150 - 400 K/uL 304  349  302        Latest Ref Rng & Units 12/28/2023    5:48 AM 12/27/2023    5:43 PM 10/24/2023   12:40 AM  CMP  Glucose 70 - 99 mg/dL 890  875  865   BUN 6 - 20 mg/dL 16  17  42   Creatinine 0.61 - 1.24 mg/dL 7.94  7.81  7.49   Sodium 135 - 145 mmol/L 145  144  140   Potassium 3.5 - 5.1 mmol/L 4.0  4.0  3.2   Chloride 98 - 111 mmol/L 105  105  104   CO2 22 - 32 mmol/L 29  25  24    Calcium  8.9 - 10.3 mg/dL 9.1  9.5  8.8   Total Protein 6.5 - 8.1 g/dL  7.5  7.1   Total Bilirubin 0.0 - 1.2 mg/dL  0.5  0.6   Alkaline Phos 38 - 126 U/L  48  59   AST 15 - 41 U/L  23  28   ALT 0 - 44 U/L  27  28    MR PELVIS W WO CONTRAST Result Date: 12/28/2023 EXAM: MRI OF THE PELVIS WITH AND WITHOUT INTRAVENOUS CONTRAST 12/27/2023 11:17:42 PM TECHNIQUE: Multiplanar magnetic resonance images of the pelvis with and without intravenous contrast. 10mL gadobutrol  (GADAVIST ) 1 MMOL/ML injection. COMPARISON: CT scan from 2022.  CLINICAL HISTORY: Osteomyelitis suspected, pelvis, xray done. FINDINGS: SOFT TISSUES: The soft tissues are unremarkable. JOINTS: The left sacroiliac (SI) joint demonstrates periarticular increased T2 signal and decreased T1 signal within the bone marrow, and a small amount of fluid noted within the joint space. The SI joint is widened, and there are signs of overlying cortical erosion. On postcontrast sequences, there is corresponding enhancement within the periarticular sacrum and left iliac bone . LIMITED INTRAPELVIC CONTENTS: No acute abnormality. BONES: Within the left iliac bone adjacent to the SI joint, there is a focal area of marked decreased T2 signal and decreased T1 signal, corresponding to a chronic area of sclerosis. On postcontrast sequences, there is enhancement within the sacrum and left iliac bone along both sides of the SI joint. Mildly asymmetric increased T2 signal and enhancement along the left ilacus muscle is noted without discrete fluid collection. The findings are concerning for inflammatory arthropathy (e.g., spondyloarthropathy), infection (e.g., osteomyelitis, septic arthritis), or chronic stress injury. IMPRESSION: 1. Findings consistent with left sacroiliitis with joint widening, subchondral marrow edema, small intra-articular fluid, cortical erosions, and postcontrast enhancement along both sides of the left SI joint. Differential includes infectious sacroiliitis/osteomyelitis, inflammatory spondyloarthropathy-related sacroiliitis, and less likely neuropathic arthropathy; recommend correlation with inflammatory markers and consideration of image-guided aspiration if clinically indicated. 2. Chronic sclerosis in the left iliac bone adjacent to the SI joint, present since 2022, which may reflect prior/reactive change; superimposed active inflammation is present. 3. Mild asymmetric edema and enhancement along the left iliopsoas complex without discrete drainable collection. Electronically  signed by: Waddell  Stroud MD 12/28/2023 05:30 AM EST RP Workstation: [id number]   DG Ankle 2 Views Right Result Date: 12/28/2023 EXAM: 2 VIEW(S) XRAY OF THE RIGHT ANKLE 12/28/2023 01:18:13 AM CLINICAL HISTORY: Right ankle pain. COMPARISON: None available. FINDINGS: BONES AND JOINTS: No acute fracture. No malalignment. SOFT TISSUES: Mild lateral soft tissue swelling. IMPRESSION: 1. No acute bony abnormality. Electronically signed by: Franky Crease MD 12/28/2023 01:23 AM EST RP Workstation: [id number]   MR Lumbar Spine W Wo Contrast Result Date: 12/27/2023 EXAM: MRI LUMBAR SPINE 12/27/2023 11:17:10 PM TECHNIQUE: Multiplanar multisequence MRI of the lumbar spine was performed without or with the administration of intravenous contrast. COMPARISON: None available. CONTRAST: 10mL Gadavist  CLINICAL HISTORY: Low back pain, infection suspected, positive xray/CT FINDINGS: BONES AND ALIGNMENT: Normal alignment. Normal vertebral body heights. Mild degenerative endplate signal changes anteriorly at L3 and L4. No specific evidence of discitis. No abnormal enhancement. SPINAL CORD: The conus terminates normally. No abnormal enhancement. SOFT TISSUES: Renal cysts. L1-L2: No significant disc herniation. No spinal canal stenosis or neural foraminal narrowing. L2-L3: No significant disc herniation. No spinal canal stenosis or neural foraminal narrowing. L3-L4: No significant disc herniation. No spinal canal stenosis or neural foraminal narrowing. L4-L5: No significant disc herniation. No spinal canal stenosis or neural foraminal narrowing. L5-S1: No significant disc herniation. No spinal canal stenosis or neural foraminal narrowing. IMPRESSION: 1. No evidence of acute abnormality or significant stenosis. Electronically signed by: Gilmore Molt MD 12/27/2023 11:24 PM EST RP Workstation: [id number]   CT PELVIS WO CONTRAST Result Date: 12/27/2023 EXAM: CT PELVIS, WITHOUT IV CONTRAST 12/27/2023 09:10:00 PM TECHNIQUE: Axial images  were acquired through the pelvis without IV contrast. Reformatted images were reviewed. Automated exposure control, iterative reconstruction, and/or weight based adjustment of the mA/kV was utilized to reduce the radiation dose to as low as reasonably achievable. COMPARISON: CT pelvis 10/23/2020. CLINICAL HISTORY: Soft tissue infection suspected, pelvis, xray done; L hip pain, hx of septic arthritis in L SI joint, feels similar. FINDINGS: BONES: Similar-appearing sclerotic lesion of the left iliac bone. No acute fracture. JOINTS: No dislocation. The joint spaces are normal. SOFT TISSUES: Bilateral fat containing inguinal hernia. INTRAPELVIC CONTENTS: Limited images of the intrapelvic contents demonstrate no acute abnormality. IMPRESSION: 1. No acute osseous abnormality. 2. Bilateral fat containing inguinal hernias. Electronically signed by: Morgane Naveau MD 12/27/2023 09:23 PM EST RP Workstation: [id number]   DG Chest Port 1 View Result Date: 12/27/2023 EXAM: 1 VIEW(S) XRAY OF THE CHEST 12/27/2023 06:23:00 PM COMPARISON: 10/24/2023 CLINICAL HISTORY: Questionable sepsis - evaluate for abnormality FINDINGS: LUNGS AND PLEURA: No focal pulmonary opacity. No pleural effusion. No pneumothorax. HEART AND MEDIASTINUM: No acute abnormality of the cardiac and mediastinal silhouettes. BONES AND SOFT TISSUES: No acute osseous abnormality. IMPRESSION: 1. No acute cardiopulmonary abnormality identified. Electronically signed by: Franky Crease MD 12/27/2023 06:25 PM EST RP Workstation: [id number]    Signed: Lonni Africa, DO Internal Medicine Resident, PGY-2 [facility]  9:28 AM, 12/28/2023

## 2023-12-29 ENCOUNTER — Telehealth: Payer: Self-pay

## 2023-12-29 ENCOUNTER — Ambulatory Visit: Payer: Self-pay

## 2023-12-29 NOTE — Transitions of Care (Post Inpatient/ED Visit) (Signed)
   12/29/2023  Name: Kyle Montgomery MRN: [id number] DOB: November 10, 1975  Today's TOC FU Call Status: Today's TOC FU Call Status:: Successful TOC FU Call Completed TOC FU Call Complete Date: 12/29/23  Patient's Name and Date of Birth confirmed. Name, DOB  Transition Care Management Follow-up Telephone Call Date of Discharge: 12/28/23 Discharge Facility: [facility]) Type of Discharge: Inpatient Admission Primary Inpatient Discharge Diagnosis:: gout How have you been since you were released from the hospital?: Better Any questions or concerns?: No  Items Reviewed: Did you receive and understand the discharge instructions provided?: Yes Medications obtained,verified, and reconciled?: Yes (Medications Reviewed) Any new allergies since your discharge?: No Dietary orders reviewed?: Yes Do you have support at home?: Yes People in Home [RPT]: parent(s)  Medications Reviewed Today: Medications Reviewed Today     Reviewed by Emmitt Pan, LPN (Licensed Practical Nurse) on 12/29/23 at 1508  Med List Status: <None>   Medication Order Taking? Sig Documenting Provider Last Dose Status Informant  amLODipine  (NORVASC ) 10 MG tablet [id number] Yes TAKE 1 TABLET BY MOUTH EVERY DAY Norrine Sharper, MD  Active Self, Pharmacy Records  atorvastatin  (LIPITOR) 40 MG tablet [id number] Yes TAKE 1 TABLET BY MOUTH EVERY DAY McLendon, Michael, MD  Active Self, Pharmacy Records  colchicine  0.6 MG tablet [id number] Yes Take 1 tablet (0.6 mg total) by mouth daily for 7 doses. Stop taking one day after pain has ended. Harrie Bruckner, DO  Active   diclofenac  Sodium (VOLTAREN ) 1 % GEL [id number]  Apply 2 g topically 4 (four) times daily.  Patient not taking: Reported on 12/29/2023   Harris, Abigail, PA-C  Active Self, Pharmacy Records  divalproex  (DEPAKOTE  ER) 500 MG 24 hr tablet [id number] Yes Take 1 tablet (500 mg total) by mouth at bedtime. Norrine Sharper, MD  Active Self, Pharmacy Records   HYDROcodone -acetaminophen  (NORCO/VICODIN) 5-325 MG tablet [id number] Yes Take 1 tablet by mouth daily as needed for up to 5 days for severe pain (pain score 7-10). Nguyen, Diana, MD  Active Self, Pharmacy Records  lisinopril  (ZESTRIL ) 40 MG tablet [id number] Yes Take 1 tablet (40 mg total) by mouth daily. Harrie Bruckner, DO  Active   polyethylene glycol (MIRALAX  / GLYCOLAX ) 17 g packet [id number] Yes Take 17 g by mouth daily as needed for mild constipation. Harrie Bruckner, DO  Active   ziprasidone  (GEODON ) 80 MG capsule [id number] Yes Take 1 capsule (80 mg total) by mouth 2 (two) times daily. Kandis Perkins, DO  Active Self, Pharmacy Records            Home Care and Equipment/Supplies: Were Home Health Services Ordered?: NA Any new equipment or medical supplies ordered?: NA  Functional Questionnaire: Do you need assistance with bathing/showering or dressing?: No Do you need assistance with meal preparation?: Yes Do you need assistance with eating?: No Do you have difficulty maintaining continence: No Do you need assistance with getting out of bed/getting out of a chair/moving?: No Do you have difficulty managing or taking your medications?: Yes  Follow up appointments reviewed: PCP Follow-up appointment confirmed?: No (sent message to staff to schedule) MD Provider Line Number:[phone] Given: No Specialist Hospital Follow-up appointment confirmed?: NA Do you need transportation to your follow-up appointment?: No Do you understand care options if your condition(s) worsen?: Yes-patient verbalized understanding    SIGNATURE Pan Emmitt, LPN [facility] Nurse Health Advisor Direct Dial [phone]

## 2023-12-31 LAB — BODY FLUID CULTURE W GRAM STAIN: Culture: NO GROWTH

## 2024-01-01 LAB — CULTURE, BLOOD (ROUTINE X 2)
Culture: NO GROWTH
Culture: NO GROWTH
Special Requests: ADEQUATE

## 2024-01-14 ENCOUNTER — Inpatient Hospital Stay: Admitting: Student

## 2024-01-22 ENCOUNTER — Other Ambulatory Visit: Payer: Self-pay | Admitting: Student

## 2024-01-23 NOTE — Telephone Encounter (Signed)
 Medication sent to pharmacy

## 2024-01-28 ENCOUNTER — Ambulatory Visit: Payer: Self-pay | Admitting: Student

## 2024-01-28 ENCOUNTER — Other Ambulatory Visit (HOSPITAL_COMMUNITY): Payer: Self-pay

## 2024-01-28 VITALS — BP 127/84 | HR 87 | Temp 98.3°F | Ht 70.0 in | Wt 209.6 lb

## 2024-01-28 DIAGNOSIS — D631 Anemia in chronic kidney disease: Secondary | ICD-10-CM | POA: Diagnosis not present

## 2024-01-28 DIAGNOSIS — I129 Hypertensive chronic kidney disease with stage 1 through stage 4 chronic kidney disease, or unspecified chronic kidney disease: Secondary | ICD-10-CM

## 2024-01-28 DIAGNOSIS — N1832 Chronic kidney disease, stage 3b: Secondary | ICD-10-CM

## 2024-01-28 DIAGNOSIS — E1122 Type 2 diabetes mellitus with diabetic chronic kidney disease: Secondary | ICD-10-CM

## 2024-01-28 DIAGNOSIS — M1A0790 Idiopathic chronic gout, unspecified ankle and foot, without tophus (tophi): Secondary | ICD-10-CM

## 2024-01-28 DIAGNOSIS — D649 Anemia, unspecified: Secondary | ICD-10-CM

## 2024-01-28 DIAGNOSIS — E119 Type 2 diabetes mellitus without complications: Secondary | ICD-10-CM

## 2024-01-28 DIAGNOSIS — I10 Essential (primary) hypertension: Secondary | ICD-10-CM

## 2024-01-28 LAB — POCT GLYCOSYLATED HEMOGLOBIN (HGB A1C): HbA1c, POC (controlled diabetic range): 6.2 % (ref 0.0–7.0)

## 2024-01-28 LAB — GLUCOSE, CAPILLARY: Glucose-Capillary: 111 mg/dL — ABNORMAL HIGH (ref 70–99)

## 2024-01-28 MED ORDER — ACETAMINOPHEN ER 650 MG PO TBCR
650.0000 mg | EXTENDED_RELEASE_TABLET | Freq: Three times a day (TID) | ORAL | 2 refills | Status: AC | PRN
  Filled 2024-01-28: qty 100, 34d supply, fill #0

## 2024-01-28 NOTE — Progress Notes (Signed)
" ° °  CC: Hospital Follow Up after admission from 12/27/2023 to 12/28/2023 for acute polyarticular gout  HPI:  Kyle Montgomery is a 49 y.o. male with pertinent PMH of hypertension, type 2 diabetes, gout, CKD stage IIIb, and bipolar affective disorder who presents as above. Please see assessment and plan below for further details.  Medications: Current Outpatient Medications  Medication Instructions   acetaminophen  (TYLENOL  8 HOUR) 650 mg, Oral, Every 8 hours PRN   amLODipine  (NORVASC ) 10 MG tablet TAKE 1 TABLET BY MOUTH EVERY DAY   atorvastatin  (LIPITOR) 40 MG tablet TAKE 1 TABLET BY MOUTH EVERY DAY   divalproex  (DEPAKOTE  ER) 500 mg, Oral, Daily at bedtime   lisinopril  (ZESTRIL ) 40 mg, Oral, Daily   polyethylene glycol (MIRALAX  / GLYCOLAX ) 17 g, Oral, Daily PRN   ziprasidone  (GEODON ) 80 mg, Oral, 2 times daily     Review of Systems:   Pertinent items noted in HPI and/or A&P.  Physical Exam:  Vitals:   01/28/24 0818  BP: 127/84  Pulse: 87  Temp: 98.3 F (36.8 C)  TempSrc: Oral  SpO2: 95%  Weight: 209 lb 9.6 oz (95.1 kg)  Height: 5' 10 (1.778 m)    Constitutional: Well-appearing adult male. In no acute distress. HEENT: Normocephalic, atraumatic, Sclera non-icteric, PERRL, EOM intact Cardio:Regular rate and rhythm. 2+ bilateral radial and dorsalis pedis  pulses. Pulm:Clear to auscultation bilaterally. Normal work of breathing on room air. FDX:Wzhjupcz for extremity edema. Skin:Warm and dry. Neuro:Alert and oriented x3. No focal deficit noted. Psych:Pleasant mood and affect.   Assessment & Plan:   Assessment & Plan Type 2 diabetes mellitus treated without insulin  (HCC) A1c today 6.2.  He continues to do well with lifestyle modifications. - Repeat A1c in 3 months Essential hypertension CKD stage 3b, GFR 30-44 ml/min (HCC) Blood pressure stable today at 127/84.  He continues on amlodipine  and lisinopril  after stopping hydrochlorothiazide  in the hospital.  With his gout we  could switch from lisinopril  to losartan at his next visit but he just picked up a 90-day supply of the lisinopril  so we will hold off today. - Continue amlodipine  10 mg daily and lisinopril  40 mg daily - BMP today Idiopathic chronic gout of foot without tophus, unspecified laterality Admitted about a month ago to [facility] for acute polyarticular gout diagnosed after arthrocentesis of the right ankle.  He was given a week of colchicine  and has had resolution of his symptoms.  He has not had any return of his symptoms and this appears to be his first gout flare in at least a year.  No prior uric acid level available so we will check this today and if not significantly elevated we will continue to treat as needed for any gout flares. - Uric acid today Normocytic anemia CBC in the hospital on 12/28/2023 showed hemoglobin of 10.2 without other significant abnormalities.  No reported sites of ongoing bleeding. - Repeat CBC today  Orders Placed This Encounter  Procedures   Glucose, capillary   Uric acid   Basic metabolic panel with GFR   CBC with Differential/Platelet   POC Hbg A1C     Return in about 3 months (around 04/27/2024) for Routine Follow Up.   Patient discussed with Dr. MICAEL Riis Montgomery  Kyle Pool, DO Internal [facility] Center Internal Medicine Resident PGY-3 Clinic Phone: [phone] Please contact the on call pager at [phone] for any urgent or emergent needs. "

## 2024-01-28 NOTE — Assessment & Plan Note (Signed)
 Blood pressure stable today at 127/84.  He continues on amlodipine  and lisinopril  after stopping hydrochlorothiazide  in the hospital.  With his gout we could switch from lisinopril  to losartan at his next visit but he just picked up a 90-day supply of the lisinopril  so we will hold off today. - Continue amlodipine  10 mg daily and lisinopril  40 mg daily - BMP today

## 2024-01-28 NOTE — Patient Instructions (Signed)
 Thank you, Kyle Montgomery, for allowing us  to provide your care today. Today we discussed . . .  > Gout       - I am glad that the pain in your foot is a lot better.  You can take Tylenol  (acetaminophen ) 650 mg every 8 hours as needed, but if you are not having any pain you do not need to take this medicine.  If you have any return of the pain that you had before you went to the hospital please call our office and let us  know or go to the emergency department.  We will check your uric acid level today and I will call you or message you with results if we need to talk about starting a medication to help prevent gout flares. > Diabetes       - Your A1c today is 6.2.  You are doing a great job controlling your diabetes with a good diet.  We will plan to recheck your A1c at your next visit in 3 months.   I have ordered the following labs for you:   Lab Orders         Glucose, capillary         Uric acid         Basic metabolic panel with GFR         CBC with Differential/Platelet         POC Hbg A1C       Follow up: 3 months    Remember:  Should you have any questions or concerns please call the internal medicine clinic at [phone].     Fairy Pool, DO [facility]

## 2024-01-28 NOTE — Assessment & Plan Note (Signed)
 A1c today 6.2.  He continues to do well with lifestyle modifications. - Repeat A1c in 3 months

## 2024-01-29 ENCOUNTER — Other Ambulatory Visit: Payer: Self-pay

## 2024-01-29 ENCOUNTER — Other Ambulatory Visit (HOSPITAL_COMMUNITY): Payer: Self-pay

## 2024-01-29 LAB — BASIC METABOLIC PANEL WITH GFR
BUN/Creatinine Ratio: 12 (ref 9–20)
BUN: 25 mg/dL — ABNORMAL HIGH (ref 6–24)
CO2: 19 mmol/L — ABNORMAL LOW (ref 20–29)
Calcium: 9.3 mg/dL (ref 8.7–10.2)
Chloride: 107 mmol/L — ABNORMAL HIGH (ref 96–106)
Creatinine, Ser: 2.11 mg/dL — ABNORMAL HIGH (ref 0.76–1.27)
Glucose: 111 mg/dL — ABNORMAL HIGH (ref 70–99)
Potassium: 3.6 mmol/L (ref 3.5–5.2)
Sodium: 144 mmol/L (ref 134–144)
eGFR: 38 mL/min/1.73 — ABNORMAL LOW

## 2024-01-29 LAB — CBC WITH DIFFERENTIAL/PLATELET
Basophils Absolute: 0 x10E3/uL (ref 0.0–0.2)
Basos: 1 %
EOS (ABSOLUTE): 0.3 x10E3/uL (ref 0.0–0.4)
Eos: 4 %
Hematocrit: 36.8 % — ABNORMAL LOW (ref 37.5–51.0)
Hemoglobin: 12 g/dL — ABNORMAL LOW (ref 13.0–17.7)
Immature Grans (Abs): 0 x10E3/uL (ref 0.0–0.1)
Immature Granulocytes: 0 %
Lymphocytes Absolute: 1.6 x10E3/uL (ref 0.7–3.1)
Lymphs: 26 %
MCH: 28.2 pg (ref 26.6–33.0)
MCHC: 32.6 g/dL (ref 31.5–35.7)
MCV: 86 fL (ref 79–97)
Monocytes Absolute: 0.7 x10E3/uL (ref 0.1–0.9)
Monocytes: 11 %
Neutrophils Absolute: 3.6 x10E3/uL (ref 1.4–7.0)
Neutrophils: 58 %
Platelets: 249 x10E3/uL (ref 150–450)
RBC: 4.26 x10E6/uL (ref 4.14–5.80)
RDW: 15 % (ref 11.6–15.4)
WBC: 6.2 x10E3/uL (ref 3.4–10.8)

## 2024-01-29 LAB — URIC ACID: Uric Acid: 10.8 mg/dL — ABNORMAL HIGH (ref 3.8–8.4)

## 2024-01-30 NOTE — Progress Notes (Signed)
 Internal Medicine Clinic Attending  Case discussed with the resident at the time of the visit.  We reviewed the resident's history and exam and pertinent patient test results.  I agree with the assessment, diagnosis, and plan of care documented in the resident's note.

## 2024-02-09 ENCOUNTER — Other Ambulatory Visit (HOSPITAL_COMMUNITY): Payer: Self-pay

## 2024-02-11 ENCOUNTER — Ambulatory Visit: Payer: Self-pay | Admitting: Student

## 2024-02-11 ENCOUNTER — Other Ambulatory Visit (HOSPITAL_COMMUNITY): Payer: Self-pay

## 2024-02-11 DIAGNOSIS — M1A0790 Idiopathic chronic gout, unspecified ankle and foot, without tophus (tophi): Secondary | ICD-10-CM | POA: Insufficient documentation

## 2024-02-11 MED ORDER — ALLOPURINOL 100 MG PO TABS
50.0000 mg | ORAL_TABLET | Freq: Every day | ORAL | 2 refills | Status: AC
  Filled 2024-02-11: qty 15, 30d supply, fill #0

## 2024-02-11 MED ORDER — COLCHICINE 0.6 MG PO TABS
0.6000 mg | ORAL_TABLET | Freq: Every day | ORAL | 0 refills | Status: AC
  Filled 2024-02-11: qty 30, 30d supply, fill #0

## 2024-02-11 NOTE — Progress Notes (Signed)
 CBC stable with mild anemia, metabolic panel with stable renal function, CKD stage IIIb, uric acid level elevated at 10.8.  Discussed starting urate lowering therapy with the patient over the phone and he is agreeable.  He will make an appointment in about 6-8 weeks or we will recheck his uric acid level.  Start allopurinol  50 mg daily (renally dosed) and colchicine  0.6 mg daily for a planned 3 months then allopurinol  alone.

## 2024-02-13 ENCOUNTER — Other Ambulatory Visit (HOSPITAL_COMMUNITY): Payer: Self-pay

## 2024-02-23 ENCOUNTER — Other Ambulatory Visit (HOSPITAL_COMMUNITY): Payer: Self-pay

## 2024-02-23 ENCOUNTER — Other Ambulatory Visit: Payer: Self-pay | Admitting: Student

## 2024-02-23 DIAGNOSIS — I10 Essential (primary) hypertension: Secondary | ICD-10-CM

## 2024-02-23 MED FILL — Atorvastatin Calcium Tab 40 MG (Base Equivalent): ORAL | 90 days supply | Qty: 90 | Fill #1 | Status: AC

## 2024-02-24 ENCOUNTER — Other Ambulatory Visit (HOSPITAL_COMMUNITY): Payer: Self-pay

## 2024-02-24 MED ORDER — AMLODIPINE BESYLATE 10 MG PO TABS
10.0000 mg | ORAL_TABLET | Freq: Every day | ORAL | 3 refills | Status: AC
  Filled 2024-02-24: qty 90, 90d supply, fill #0

## 2024-02-24 NOTE — Telephone Encounter (Signed)
 Medication sent to pharmacy

## 2024-02-25 ENCOUNTER — Other Ambulatory Visit (HOSPITAL_COMMUNITY): Payer: Self-pay

## 2024-02-26 ENCOUNTER — Other Ambulatory Visit: Payer: Self-pay | Admitting: Student

## 2024-02-26 ENCOUNTER — Other Ambulatory Visit (HOSPITAL_COMMUNITY): Payer: Self-pay

## 2024-02-26 DIAGNOSIS — F3170 Bipolar disorder, currently in remission, most recent episode unspecified: Secondary | ICD-10-CM

## 2024-02-26 MED ORDER — DIVALPROEX SODIUM ER 500 MG PO TB24
500.0000 mg | ORAL_TABLET | Freq: Every day | ORAL | 0 refills | Status: AC
  Filled 2024-02-26: qty 90, 90d supply, fill #0

## 2024-04-27 ENCOUNTER — Ambulatory Visit: Payer: Self-pay
